# Patient Record
Sex: Female | Born: 1960 | Race: White | Hispanic: No | Marital: Single | Smoking: Current every day smoker
Health system: Southern US, Community
[De-identification: ages and names within clinical notes are randomized; demographics above are authoritative.]

## PROBLEM LIST (undated history)

## (undated) DIAGNOSIS — M1990 Unspecified osteoarthritis, unspecified site: Secondary | ICD-10-CM

## (undated) DIAGNOSIS — F339 Major depressive disorder, recurrent, unspecified: Secondary | ICD-10-CM

## (undated) DIAGNOSIS — E785 Hyperlipidemia, unspecified: Secondary | ICD-10-CM

## (undated) DIAGNOSIS — F319 Bipolar disorder, unspecified: Secondary | ICD-10-CM

## (undated) DIAGNOSIS — Z87442 Personal history of urinary calculi: Secondary | ICD-10-CM

## (undated) DIAGNOSIS — I10 Essential (primary) hypertension: Secondary | ICD-10-CM

## (undated) DIAGNOSIS — Z8619 Personal history of other infectious and parasitic diseases: Secondary | ICD-10-CM

## (undated) DIAGNOSIS — N9489 Other specified conditions associated with female genital organs and menstrual cycle: Secondary | ICD-10-CM

## (undated) DIAGNOSIS — N159 Renal tubulo-interstitial disease, unspecified: Secondary | ICD-10-CM

## (undated) DIAGNOSIS — F17200 Nicotine dependence, unspecified, uncomplicated: Secondary | ICD-10-CM

## (undated) DIAGNOSIS — J449 Chronic obstructive pulmonary disease, unspecified: Secondary | ICD-10-CM

## (undated) DIAGNOSIS — I509 Heart failure, unspecified: Secondary | ICD-10-CM

## (undated) DIAGNOSIS — Z8679 Personal history of other diseases of the circulatory system: Secondary | ICD-10-CM

## (undated) DIAGNOSIS — E119 Type 2 diabetes mellitus without complications: Secondary | ICD-10-CM

## (undated) DIAGNOSIS — F329 Major depressive disorder, single episode, unspecified: Secondary | ICD-10-CM

## (undated) DIAGNOSIS — M545 Low back pain: Secondary | ICD-10-CM

## (undated) DIAGNOSIS — R3915 Urgency of urination: Secondary | ICD-10-CM

## (undated) DIAGNOSIS — Z794 Long term (current) use of insulin: Secondary | ICD-10-CM

## (undated) DIAGNOSIS — N182 Chronic kidney disease, stage 2 (mild): Secondary | ICD-10-CM

## (undated) DIAGNOSIS — A419 Sepsis, unspecified organism: Secondary | ICD-10-CM

## (undated) DIAGNOSIS — N390 Urinary tract infection, site not specified: Secondary | ICD-10-CM

## (undated) DIAGNOSIS — N201 Calculus of ureter: Secondary | ICD-10-CM

## (undated) DIAGNOSIS — Z973 Presence of spectacles and contact lenses: Secondary | ICD-10-CM

## (undated) HISTORY — DX: Sepsis, unspecified organism: A41.9

## (undated) HISTORY — DX: Essential (primary) hypertension: I10

## (undated) HISTORY — DX: Nicotine dependence, unspecified, uncomplicated: F17.200

## (undated) HISTORY — PX: CYSTOSCOPY/URETEROSCOPY/HOLMIUM LASER/STENT PLACEMENT: SHX6546

## (undated) HISTORY — PX: VAGINAL HYSTERECTOMY: SUR661

## (undated) HISTORY — DX: Other specified conditions associated with female genital organs and menstrual cycle: N94.89

## (undated) HISTORY — DX: Urinary tract infection, site not specified: N39.0

## (undated) HISTORY — DX: Major depressive disorder, recurrent, unspecified: F33.9

## (undated) HISTORY — DX: Bipolar disorder, unspecified: F31.9

## (undated) HISTORY — DX: Low back pain: M54.5

## (undated) HISTORY — PX: PARTIAL HYSTERECTOMY: SHX80

## (undated) HISTORY — PX: OTHER SURGICAL HISTORY: SHX169

## (undated) HISTORY — DX: Type 2 diabetes mellitus without complications: E11.9

## (undated) HISTORY — DX: Hyperlipidemia, unspecified: E78.5

---

## 1999-11-22 ENCOUNTER — Emergency Department (HOSPITAL_COMMUNITY): Admission: EM | Admit: 1999-11-22 | Discharge: 1999-11-22 | Payer: Self-pay | Admitting: Emergency Medicine

## 2000-05-21 ENCOUNTER — Emergency Department (HOSPITAL_COMMUNITY): Admission: EM | Admit: 2000-05-21 | Discharge: 2000-05-21 | Payer: Self-pay

## 2000-09-30 ENCOUNTER — Inpatient Hospital Stay (HOSPITAL_COMMUNITY): Admission: EM | Admit: 2000-09-30 | Discharge: 2000-10-02 | Payer: Self-pay | Admitting: Emergency Medicine

## 2000-09-30 ENCOUNTER — Encounter: Payer: Self-pay | Admitting: Emergency Medicine

## 2000-10-01 ENCOUNTER — Encounter: Payer: Self-pay | Admitting: Internal Medicine

## 2000-10-16 ENCOUNTER — Encounter: Admission: RE | Admit: 2000-10-16 | Discharge: 2000-10-16 | Payer: Self-pay | Admitting: Family Medicine

## 2000-10-16 ENCOUNTER — Ambulatory Visit (HOSPITAL_COMMUNITY): Admission: RE | Admit: 2000-10-16 | Discharge: 2000-10-16 | Payer: Self-pay | Admitting: *Deleted

## 2001-02-04 ENCOUNTER — Encounter: Admission: RE | Admit: 2001-02-04 | Discharge: 2001-02-04 | Payer: Self-pay | Admitting: Family Medicine

## 2001-03-07 ENCOUNTER — Encounter: Admission: RE | Admit: 2001-03-07 | Discharge: 2001-03-07 | Payer: Self-pay | Admitting: Family Medicine

## 2001-08-11 ENCOUNTER — Encounter: Admission: RE | Admit: 2001-08-11 | Discharge: 2001-08-11 | Payer: Self-pay | Admitting: Family Medicine

## 2002-05-20 ENCOUNTER — Encounter: Admission: RE | Admit: 2002-05-20 | Discharge: 2002-05-20 | Payer: Self-pay | Admitting: Family Medicine

## 2002-06-30 ENCOUNTER — Encounter: Admission: RE | Admit: 2002-06-30 | Discharge: 2002-06-30 | Payer: Self-pay | Admitting: Family Medicine

## 2003-08-31 ENCOUNTER — Encounter (INDEPENDENT_AMBULATORY_CARE_PROVIDER_SITE_OTHER): Payer: Self-pay | Admitting: *Deleted

## 2003-09-10 ENCOUNTER — Ambulatory Visit (HOSPITAL_COMMUNITY): Admission: RE | Admit: 2003-09-10 | Discharge: 2003-09-10 | Payer: Self-pay | Admitting: Family Medicine

## 2003-09-28 ENCOUNTER — Encounter: Admission: RE | Admit: 2003-09-28 | Discharge: 2003-09-28 | Payer: Self-pay | Admitting: Sports Medicine

## 2003-09-28 ENCOUNTER — Other Ambulatory Visit: Admission: RE | Admit: 2003-09-28 | Discharge: 2003-09-28 | Payer: Self-pay | Admitting: Geriatric Medicine

## 2003-09-28 ENCOUNTER — Encounter (INDEPENDENT_AMBULATORY_CARE_PROVIDER_SITE_OTHER): Payer: Self-pay | Admitting: *Deleted

## 2003-09-30 ENCOUNTER — Ambulatory Visit (HOSPITAL_COMMUNITY): Admission: RE | Admit: 2003-09-30 | Discharge: 2003-09-30 | Payer: Self-pay | Admitting: Family Medicine

## 2003-10-06 ENCOUNTER — Encounter: Admission: RE | Admit: 2003-10-06 | Discharge: 2003-10-06 | Payer: Self-pay | Admitting: Family Medicine

## 2004-10-30 ENCOUNTER — Emergency Department (HOSPITAL_COMMUNITY): Admission: EM | Admit: 2004-10-30 | Discharge: 2004-10-30 | Payer: Self-pay | Admitting: Emergency Medicine

## 2004-12-08 ENCOUNTER — Ambulatory Visit: Payer: Self-pay | Admitting: Family Medicine

## 2005-01-10 ENCOUNTER — Ambulatory Visit: Payer: Self-pay | Admitting: Family Medicine

## 2005-10-25 ENCOUNTER — Ambulatory Visit: Payer: Self-pay | Admitting: Family Medicine

## 2005-11-19 ENCOUNTER — Ambulatory Visit: Payer: Self-pay | Admitting: Family Medicine

## 2005-12-24 ENCOUNTER — Ambulatory Visit: Payer: Self-pay | Admitting: Sports Medicine

## 2006-01-07 ENCOUNTER — Ambulatory Visit: Payer: Self-pay | Admitting: Family Medicine

## 2006-01-13 ENCOUNTER — Emergency Department (HOSPITAL_COMMUNITY): Admission: EM | Admit: 2006-01-13 | Discharge: 2006-01-13 | Payer: Self-pay | Admitting: Emergency Medicine

## 2006-01-15 ENCOUNTER — Emergency Department (HOSPITAL_COMMUNITY): Admission: EM | Admit: 2006-01-15 | Discharge: 2006-01-16 | Payer: Self-pay | Admitting: Emergency Medicine

## 2006-02-22 ENCOUNTER — Ambulatory Visit: Payer: Self-pay | Admitting: Family Medicine

## 2006-03-06 ENCOUNTER — Ambulatory Visit: Payer: Self-pay | Admitting: Family Medicine

## 2006-05-13 ENCOUNTER — Ambulatory Visit: Payer: Self-pay | Admitting: Sports Medicine

## 2006-05-20 ENCOUNTER — Ambulatory Visit: Payer: Self-pay | Admitting: Family Medicine

## 2006-05-28 ENCOUNTER — Ambulatory Visit: Payer: Self-pay | Admitting: Family Medicine

## 2006-06-03 ENCOUNTER — Ambulatory Visit: Payer: Self-pay | Admitting: Sports Medicine

## 2006-06-13 ENCOUNTER — Ambulatory Visit: Payer: Self-pay | Admitting: Sports Medicine

## 2006-06-19 ENCOUNTER — Ambulatory Visit: Payer: Self-pay | Admitting: Family Medicine

## 2006-06-20 ENCOUNTER — Ambulatory Visit: Payer: Self-pay | Admitting: Family Medicine

## 2006-07-03 ENCOUNTER — Ambulatory Visit: Payer: Self-pay | Admitting: Psychology

## 2006-07-15 ENCOUNTER — Ambulatory Visit: Payer: Self-pay | Admitting: Family Medicine

## 2006-07-15 ENCOUNTER — Encounter (INDEPENDENT_AMBULATORY_CARE_PROVIDER_SITE_OTHER): Payer: Self-pay | Admitting: Family Medicine

## 2006-07-25 ENCOUNTER — Ambulatory Visit: Payer: Self-pay | Admitting: Psychology

## 2006-08-02 ENCOUNTER — Ambulatory Visit: Payer: Self-pay | Admitting: Family Medicine

## 2006-08-02 ENCOUNTER — Inpatient Hospital Stay (HOSPITAL_COMMUNITY): Admission: EM | Admit: 2006-08-02 | Discharge: 2006-08-05 | Payer: Self-pay | Admitting: Emergency Medicine

## 2006-08-07 ENCOUNTER — Ambulatory Visit: Payer: Self-pay | Admitting: Family Medicine

## 2006-08-12 ENCOUNTER — Ambulatory Visit: Payer: Self-pay | Admitting: Psychology

## 2006-08-26 ENCOUNTER — Ambulatory Visit: Payer: Self-pay | Admitting: Family Medicine

## 2006-08-29 DIAGNOSIS — M545 Low back pain, unspecified: Secondary | ICD-10-CM

## 2006-08-29 DIAGNOSIS — F339 Major depressive disorder, recurrent, unspecified: Secondary | ICD-10-CM

## 2006-08-29 DIAGNOSIS — I872 Venous insufficiency (chronic) (peripheral): Secondary | ICD-10-CM

## 2006-08-29 DIAGNOSIS — F17200 Nicotine dependence, unspecified, uncomplicated: Secondary | ICD-10-CM | POA: Insufficient documentation

## 2006-08-29 DIAGNOSIS — I10 Essential (primary) hypertension: Secondary | ICD-10-CM

## 2006-08-29 HISTORY — DX: Low back pain, unspecified: M54.50

## 2006-08-29 HISTORY — DX: Nicotine dependence, unspecified, uncomplicated: F17.200

## 2006-08-29 HISTORY — DX: Essential (primary) hypertension: I10

## 2006-08-29 HISTORY — DX: Major depressive disorder, recurrent, unspecified: F33.9

## 2006-08-30 ENCOUNTER — Encounter (INDEPENDENT_AMBULATORY_CARE_PROVIDER_SITE_OTHER): Payer: Self-pay | Admitting: *Deleted

## 2006-09-09 ENCOUNTER — Ambulatory Visit: Payer: Self-pay | Admitting: Psychology

## 2006-09-09 DIAGNOSIS — F319 Bipolar disorder, unspecified: Secondary | ICD-10-CM

## 2006-09-09 HISTORY — DX: Bipolar disorder, unspecified: F31.9

## 2006-10-21 ENCOUNTER — Encounter (INDEPENDENT_AMBULATORY_CARE_PROVIDER_SITE_OTHER): Payer: Self-pay | Admitting: Family Medicine

## 2007-01-14 ENCOUNTER — Encounter (INDEPENDENT_AMBULATORY_CARE_PROVIDER_SITE_OTHER): Payer: Self-pay | Admitting: Family Medicine

## 2007-01-30 ENCOUNTER — Telehealth: Payer: Self-pay | Admitting: *Deleted

## 2007-02-03 ENCOUNTER — Encounter (INDEPENDENT_AMBULATORY_CARE_PROVIDER_SITE_OTHER): Payer: Self-pay | Admitting: Family Medicine

## 2007-02-03 ENCOUNTER — Ambulatory Visit: Payer: Self-pay | Admitting: Family Medicine

## 2007-02-10 ENCOUNTER — Ambulatory Visit: Payer: Self-pay | Admitting: Family Medicine

## 2007-02-10 ENCOUNTER — Encounter (INDEPENDENT_AMBULATORY_CARE_PROVIDER_SITE_OTHER): Payer: Self-pay | Admitting: Family Medicine

## 2007-02-10 LAB — CONVERTED CEMR LAB
BUN: 13 mg/dL (ref 6–23)
CO2: 24 meq/L (ref 19–32)
Calcium: 9.9 mg/dL (ref 8.4–10.5)
Glucose, Bld: 151 mg/dL — ABNORMAL HIGH (ref 70–99)
TSH: 0.756 microintl units/mL (ref 0.350–5.50)

## 2007-03-04 ENCOUNTER — Ambulatory Visit: Payer: Self-pay | Admitting: Internal Medicine

## 2007-04-07 ENCOUNTER — Encounter (INDEPENDENT_AMBULATORY_CARE_PROVIDER_SITE_OTHER): Payer: Self-pay | Admitting: Family Medicine

## 2007-05-19 ENCOUNTER — Telehealth (INDEPENDENT_AMBULATORY_CARE_PROVIDER_SITE_OTHER): Payer: Self-pay | Admitting: Family Medicine

## 2007-05-19 ENCOUNTER — Encounter (INDEPENDENT_AMBULATORY_CARE_PROVIDER_SITE_OTHER): Payer: Self-pay | Admitting: Family Medicine

## 2007-05-19 ENCOUNTER — Ambulatory Visit: Payer: Self-pay | Admitting: Family Medicine

## 2007-05-19 LAB — CONVERTED CEMR LAB
Calcium: 9.2 mg/dL (ref 8.4–10.5)
Glucose, Bld: 110 mg/dL — ABNORMAL HIGH (ref 70–99)
Glucose, Urine, Semiquant: NEGATIVE
Ketones, urine, test strip: NEGATIVE
Potassium: 3.6 meq/L (ref 3.5–5.3)
Protein, U semiquant: 30
Sodium: 141 meq/L (ref 135–145)
Specific Gravity, Urine: 1.015
Urobilinogen, UA: 1

## 2007-06-03 ENCOUNTER — Telehealth: Payer: Self-pay | Admitting: *Deleted

## 2007-06-03 ENCOUNTER — Ambulatory Visit: Payer: Self-pay | Admitting: Family Medicine

## 2007-06-03 LAB — CONVERTED CEMR LAB
Glucose, Urine, Semiquant: NEGATIVE
Ketones, urine, test strip: NEGATIVE
Protein, U semiquant: 30
Specific Gravity, Urine: 1.015
pH: 7

## 2007-06-04 ENCOUNTER — Encounter (INDEPENDENT_AMBULATORY_CARE_PROVIDER_SITE_OTHER): Payer: Self-pay | Admitting: Family Medicine

## 2007-06-04 ENCOUNTER — Encounter: Admission: RE | Admit: 2007-06-04 | Discharge: 2007-06-04 | Payer: Self-pay | Admitting: Sports Medicine

## 2007-06-04 ENCOUNTER — Telehealth (INDEPENDENT_AMBULATORY_CARE_PROVIDER_SITE_OTHER): Payer: Self-pay | Admitting: Family Medicine

## 2007-06-06 ENCOUNTER — Telehealth: Payer: Self-pay | Admitting: *Deleted

## 2007-06-06 ENCOUNTER — Ambulatory Visit: Payer: Self-pay | Admitting: Family Medicine

## 2007-06-06 ENCOUNTER — Inpatient Hospital Stay (HOSPITAL_COMMUNITY): Admission: AD | Admit: 2007-06-06 | Discharge: 2007-06-08 | Payer: Self-pay | Admitting: Internal Medicine

## 2007-06-06 DIAGNOSIS — N200 Calculus of kidney: Secondary | ICD-10-CM

## 2007-06-06 DIAGNOSIS — K8020 Calculus of gallbladder without cholecystitis without obstruction: Secondary | ICD-10-CM | POA: Insufficient documentation

## 2007-06-06 HISTORY — PX: CYSTOSCOPY W/ URETERAL STENT PLACEMENT: SHX1429

## 2007-06-07 ENCOUNTER — Other Ambulatory Visit: Payer: Self-pay | Admitting: Internal Medicine

## 2007-06-12 ENCOUNTER — Ambulatory Visit: Payer: Self-pay | Admitting: Family Medicine

## 2007-06-12 ENCOUNTER — Encounter (INDEPENDENT_AMBULATORY_CARE_PROVIDER_SITE_OTHER): Payer: Self-pay | Admitting: Family Medicine

## 2007-06-12 LAB — CONVERTED CEMR LAB
BUN: 17 mg/dL (ref 6–23)
Calcium: 8.8 mg/dL (ref 8.4–10.5)
Chloride: 106 meq/L (ref 96–112)
Creatinine, Ser: 1.2 mg/dL (ref 0.40–1.20)

## 2007-06-16 ENCOUNTER — Emergency Department (HOSPITAL_COMMUNITY): Admission: EM | Admit: 2007-06-16 | Discharge: 2007-06-16 | Payer: Self-pay | Admitting: Emergency Medicine

## 2007-06-16 ENCOUNTER — Encounter (INDEPENDENT_AMBULATORY_CARE_PROVIDER_SITE_OTHER): Payer: Self-pay | Admitting: Family Medicine

## 2007-06-19 ENCOUNTER — Encounter (INDEPENDENT_AMBULATORY_CARE_PROVIDER_SITE_OTHER): Payer: Self-pay | Admitting: Family Medicine

## 2007-06-19 ENCOUNTER — Ambulatory Visit: Payer: Self-pay | Admitting: Family Medicine

## 2007-06-19 ENCOUNTER — Inpatient Hospital Stay (HOSPITAL_COMMUNITY): Admission: AD | Admit: 2007-06-19 | Discharge: 2007-06-21 | Payer: Self-pay | Admitting: Emergency Medicine

## 2007-06-23 ENCOUNTER — Ambulatory Visit (HOSPITAL_COMMUNITY): Admission: RE | Admit: 2007-06-23 | Discharge: 2007-06-23 | Payer: Self-pay | Admitting: Family Medicine

## 2007-06-23 ENCOUNTER — Encounter (INDEPENDENT_AMBULATORY_CARE_PROVIDER_SITE_OTHER): Payer: Self-pay | Admitting: Family Medicine

## 2007-06-23 ENCOUNTER — Telehealth: Payer: Self-pay | Admitting: *Deleted

## 2007-06-23 ENCOUNTER — Ambulatory Visit: Payer: Self-pay | Admitting: Family Medicine

## 2007-06-23 DIAGNOSIS — N179 Acute kidney failure, unspecified: Secondary | ICD-10-CM

## 2007-06-23 LAB — CONVERTED CEMR LAB
BUN: 10 mg/dL (ref 6–23)
Chloride: 103 meq/L (ref 96–112)
Potassium: 4.4 meq/L (ref 3.5–5.3)
Sodium: 136 meq/L (ref 135–145)

## 2007-06-24 ENCOUNTER — Encounter (INDEPENDENT_AMBULATORY_CARE_PROVIDER_SITE_OTHER): Payer: Self-pay | Admitting: Family Medicine

## 2007-06-30 ENCOUNTER — Ambulatory Visit: Payer: Self-pay | Admitting: Family Medicine

## 2007-07-01 ENCOUNTER — Encounter (INDEPENDENT_AMBULATORY_CARE_PROVIDER_SITE_OTHER): Payer: Self-pay | Admitting: Family Medicine

## 2007-07-04 ENCOUNTER — Telehealth (INDEPENDENT_AMBULATORY_CARE_PROVIDER_SITE_OTHER): Payer: Self-pay | Admitting: Family Medicine

## 2007-07-09 ENCOUNTER — Encounter (INDEPENDENT_AMBULATORY_CARE_PROVIDER_SITE_OTHER): Payer: Self-pay | Admitting: Family Medicine

## 2007-07-15 ENCOUNTER — Encounter (INDEPENDENT_AMBULATORY_CARE_PROVIDER_SITE_OTHER): Payer: Self-pay | Admitting: Family Medicine

## 2007-08-01 ENCOUNTER — Inpatient Hospital Stay (HOSPITAL_COMMUNITY): Admission: RE | Admit: 2007-08-01 | Discharge: 2007-08-03 | Payer: Self-pay | Admitting: Urology

## 2007-08-01 HISTORY — PX: PERCUTANEOUS NEPHROLITHOTRIPSY: SHX2206

## 2007-08-04 ENCOUNTER — Telehealth: Payer: Self-pay | Admitting: *Deleted

## 2007-08-04 ENCOUNTER — Ambulatory Visit: Payer: Self-pay | Admitting: Family Medicine

## 2007-08-11 ENCOUNTER — Emergency Department (HOSPITAL_COMMUNITY): Admission: EM | Admit: 2007-08-11 | Discharge: 2007-08-11 | Payer: Self-pay | Admitting: Emergency Medicine

## 2007-08-19 ENCOUNTER — Telehealth (INDEPENDENT_AMBULATORY_CARE_PROVIDER_SITE_OTHER): Payer: Self-pay | Admitting: Family Medicine

## 2007-09-18 ENCOUNTER — Ambulatory Visit: Payer: Self-pay | Admitting: Family Medicine

## 2007-09-18 ENCOUNTER — Encounter: Admission: RE | Admit: 2007-09-18 | Discharge: 2007-09-18 | Payer: Self-pay | Admitting: Family Medicine

## 2007-09-18 ENCOUNTER — Encounter (INDEPENDENT_AMBULATORY_CARE_PROVIDER_SITE_OTHER): Payer: Self-pay | Admitting: Family Medicine

## 2007-09-18 DIAGNOSIS — N9489 Other specified conditions associated with female genital organs and menstrual cycle: Secondary | ICD-10-CM | POA: Insufficient documentation

## 2007-09-18 HISTORY — DX: Other specified conditions associated with female genital organs and menstrual cycle: N94.89

## 2007-09-18 LAB — CONVERTED CEMR LAB
ALT: 21 units/L (ref 0–35)
AST: 16 units/L (ref 0–37)
Alkaline Phosphatase: 118 units/L — ABNORMAL HIGH (ref 39–117)
BUN: 9 mg/dL (ref 6–23)
Basophils Absolute: 0.2 10*3/uL — ABNORMAL HIGH (ref 0.0–0.1)
Basophils Relative: 2 % — ABNORMAL HIGH (ref 0–1)
Bilirubin Urine: NEGATIVE
Creatinine, Ser: 0.87 mg/dL (ref 0.40–1.20)
Eosinophils Absolute: 0.3 10*3/uL (ref 0.0–0.7)
Glucose, Urine, Semiquant: NEGATIVE
Hemoglobin: 13.7 g/dL (ref 12.0–15.0)
Ketones, urine, test strip: NEGATIVE
MCHC: 30.6 g/dL (ref 30.0–36.0)
MCV: 87.8 fL (ref 78.0–100.0)
Monocytes Absolute: 1 10*3/uL (ref 0.1–1.0)
Monocytes Relative: 8 % (ref 3–12)
Neutrophils Relative %: 70 % (ref 43–77)
Protein, U semiquant: 30
RBC: 5.09 M/uL (ref 3.87–5.11)
RDW: 16.2 % — ABNORMAL HIGH (ref 11.5–15.5)
Urobilinogen, UA: 0.2
pH: 7

## 2007-09-19 ENCOUNTER — Encounter: Payer: Self-pay | Admitting: *Deleted

## 2007-09-20 ENCOUNTER — Encounter (INDEPENDENT_AMBULATORY_CARE_PROVIDER_SITE_OTHER): Payer: Self-pay | Admitting: Family Medicine

## 2007-09-22 ENCOUNTER — Telehealth (INDEPENDENT_AMBULATORY_CARE_PROVIDER_SITE_OTHER): Payer: Self-pay | Admitting: Family Medicine

## 2007-09-23 ENCOUNTER — Encounter (INDEPENDENT_AMBULATORY_CARE_PROVIDER_SITE_OTHER): Payer: Self-pay | Admitting: *Deleted

## 2007-09-25 ENCOUNTER — Ambulatory Visit: Payer: Self-pay | Admitting: Family Medicine

## 2007-10-15 ENCOUNTER — Encounter (INDEPENDENT_AMBULATORY_CARE_PROVIDER_SITE_OTHER): Payer: Self-pay | Admitting: Family Medicine

## 2007-10-22 ENCOUNTER — Telehealth: Payer: Self-pay | Admitting: *Deleted

## 2007-11-04 ENCOUNTER — Ambulatory Visit (HOSPITAL_COMMUNITY): Admission: RE | Admit: 2007-11-04 | Discharge: 2007-11-05 | Payer: Self-pay | Admitting: General Surgery

## 2007-11-04 ENCOUNTER — Encounter (INDEPENDENT_AMBULATORY_CARE_PROVIDER_SITE_OTHER): Payer: Self-pay | Admitting: General Surgery

## 2007-11-04 HISTORY — PX: CHOLECYSTECTOMY, LAPAROSCOPIC: SHX56

## 2007-11-07 ENCOUNTER — Ambulatory Visit: Payer: Self-pay | Admitting: Obstetrics and Gynecology

## 2007-11-07 ENCOUNTER — Encounter: Payer: Self-pay | Admitting: Family Medicine

## 2007-11-07 ENCOUNTER — Encounter (INDEPENDENT_AMBULATORY_CARE_PROVIDER_SITE_OTHER): Payer: Self-pay | Admitting: *Deleted

## 2007-11-21 ENCOUNTER — Ambulatory Visit: Payer: Self-pay | Admitting: Gynecology

## 2007-11-25 ENCOUNTER — Ambulatory Visit (HOSPITAL_COMMUNITY): Admission: RE | Admit: 2007-11-25 | Discharge: 2007-11-25 | Payer: Self-pay | Admitting: Obstetrics & Gynecology

## 2007-12-22 ENCOUNTER — Encounter (INDEPENDENT_AMBULATORY_CARE_PROVIDER_SITE_OTHER): Payer: Self-pay | Admitting: Family Medicine

## 2007-12-24 ENCOUNTER — Encounter (INDEPENDENT_AMBULATORY_CARE_PROVIDER_SITE_OTHER): Payer: Self-pay | Admitting: Family Medicine

## 2007-12-31 DIAGNOSIS — Z8742 Personal history of other diseases of the female genital tract: Secondary | ICD-10-CM

## 2007-12-31 HISTORY — DX: Personal history of other diseases of the female genital tract: Z87.42

## 2008-01-06 ENCOUNTER — Encounter: Payer: Self-pay | Admitting: Family Medicine

## 2008-01-19 ENCOUNTER — Ambulatory Visit: Payer: Self-pay | Admitting: Gynecology

## 2008-01-19 ENCOUNTER — Inpatient Hospital Stay (HOSPITAL_COMMUNITY): Admission: RE | Admit: 2008-01-19 | Discharge: 2008-01-21 | Payer: Self-pay | Admitting: Gynecology

## 2008-01-19 ENCOUNTER — Encounter (INDEPENDENT_AMBULATORY_CARE_PROVIDER_SITE_OTHER): Payer: Self-pay | Admitting: Gynecology

## 2008-01-19 HISTORY — PX: SALPINGOOPHORECTOMY: SHX82

## 2008-02-02 ENCOUNTER — Ambulatory Visit: Payer: Self-pay | Admitting: Obstetrics & Gynecology

## 2008-02-07 ENCOUNTER — Other Ambulatory Visit: Payer: Self-pay | Admitting: Emergency Medicine

## 2008-02-07 ENCOUNTER — Inpatient Hospital Stay (HOSPITAL_COMMUNITY): Admission: AD | Admit: 2008-02-07 | Discharge: 2008-02-11 | Payer: Self-pay | Admitting: Family Medicine

## 2008-02-07 ENCOUNTER — Ambulatory Visit: Payer: Self-pay | Admitting: Gynecology

## 2008-02-07 HISTORY — PX: INCISION AND DRAINAGE ABSCESS: SHX5864

## 2008-03-04 ENCOUNTER — Ambulatory Visit: Payer: Self-pay | Admitting: Gynecology

## 2008-03-11 ENCOUNTER — Ambulatory Visit: Payer: Self-pay | Admitting: Obstetrics and Gynecology

## 2008-03-21 ENCOUNTER — Emergency Department (HOSPITAL_COMMUNITY): Admission: EM | Admit: 2008-03-21 | Discharge: 2008-03-22 | Payer: Self-pay | Admitting: Emergency Medicine

## 2008-04-02 ENCOUNTER — Ambulatory Visit: Payer: Self-pay | Admitting: Family Medicine

## 2008-04-13 ENCOUNTER — Encounter: Payer: Self-pay | Admitting: Family Medicine

## 2008-09-07 ENCOUNTER — Encounter (INDEPENDENT_AMBULATORY_CARE_PROVIDER_SITE_OTHER): Payer: Self-pay | Admitting: Family Medicine

## 2008-09-07 ENCOUNTER — Ambulatory Visit: Payer: Self-pay | Admitting: Family Medicine

## 2008-09-07 DIAGNOSIS — R300 Dysuria: Secondary | ICD-10-CM

## 2008-09-07 LAB — CONVERTED CEMR LAB
Bilirubin Urine: NEGATIVE
Glucose, Urine, Semiquant: NEGATIVE
Ketones, urine, test strip: NEGATIVE
Nitrite: NEGATIVE
Protein, U semiquant: 30
Specific Gravity, Urine: 1.015
Urobilinogen, UA: 0.2
pH: 7

## 2008-09-17 ENCOUNTER — Telehealth: Payer: Self-pay | Admitting: *Deleted

## 2008-10-11 ENCOUNTER — Encounter: Payer: Self-pay | Admitting: Family Medicine

## 2008-10-11 ENCOUNTER — Ambulatory Visit: Payer: Self-pay | Admitting: Family Medicine

## 2008-10-11 DIAGNOSIS — R609 Edema, unspecified: Secondary | ICD-10-CM | POA: Insufficient documentation

## 2008-10-11 LAB — CONVERTED CEMR LAB: Hgb A1c MFr Bld: 5.9 %

## 2008-10-13 ENCOUNTER — Encounter: Payer: Self-pay | Admitting: Family Medicine

## 2008-10-13 LAB — CONVERTED CEMR LAB
ALT: 33 units/L (ref 0–35)
AST: 26 units/L (ref 0–37)
Alkaline Phosphatase: 137 units/L — ABNORMAL HIGH (ref 39–117)
CO2: 19 meq/L (ref 19–32)
Cholesterol: 222 mg/dL — ABNORMAL HIGH (ref 0–200)
Creatinine, Ser: 0.76 mg/dL (ref 0.40–1.20)
Free T4: 0.99 ng/dL (ref 0.80–1.80)
Total Bilirubin: 0.2 mg/dL — ABNORMAL LOW (ref 0.3–1.2)
Total CHOL/HDL Ratio: 6.5
VLDL: 53 mg/dL — ABNORMAL HIGH (ref 0–40)

## 2008-12-26 ENCOUNTER — Emergency Department (HOSPITAL_COMMUNITY): Admission: EM | Admit: 2008-12-26 | Discharge: 2008-12-27 | Payer: Self-pay | Admitting: Emergency Medicine

## 2009-02-15 IMAGING — NM NM LIVER FUNCTION STUDY
1 series · 6 of 6 positions shown · non-contrast
Comparison: none

CLINICAL DATA: Evaluate for cholecystitis. 
NUCLEAR MEDICINE HEPATOBILIARY SCAN:
TECHNIQUE: Sequential abdominal images were obtained for approximately 60 minutes following intravenous injection of radiopharmaceutical.
Radiopharmaceutical:  5.5 mCi Yc-IIm Choletec

[Series 1: he hepatobiliary · 3.22mm/px · 6 of 60 frames shown]
[frame 6/60]
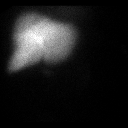
[frame 16/60]
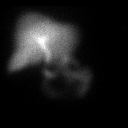
[frame 26/60]
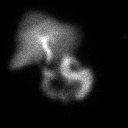
[frame 36/60]
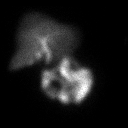
[frame 46/60]
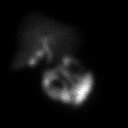
[frame 56/60]
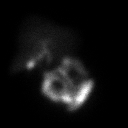

[6 of 6 positions shown; findings below may reference images not displayed]

FINDINGS: There is rapid clearance of the radiotracer from blood pool and homogeneous uptake within the liver.  The common bile duct and small bowel are evident by 15 minutes.  The gallbladder begins to fill at 40 minutes and continues to fill up to 60 minutes.  There is a photopenic defect in the mid gallbladder correlating to large gallstone seen on CT.
IMPRESSION: No evidence of acute cholecystitis. 
Cholelithiasis evident.

## 2009-03-02 ENCOUNTER — Telehealth: Payer: Self-pay | Admitting: Family Medicine

## 2009-03-24 ENCOUNTER — Ambulatory Visit: Payer: Self-pay | Admitting: Family Medicine

## 2009-03-24 LAB — CONVERTED CEMR LAB
Ketones, urine, test strip: NEGATIVE
RBC / HPF: >20
Specific Gravity, Urine: 1.015
pH: 7

## 2009-07-25 ENCOUNTER — Telehealth: Payer: Self-pay | Admitting: *Deleted

## 2009-07-25 ENCOUNTER — Telehealth: Payer: Self-pay | Admitting: Family Medicine

## 2009-07-25 ENCOUNTER — Ambulatory Visit: Payer: Self-pay | Admitting: Family Medicine

## 2009-07-25 LAB — CONVERTED CEMR LAB
Bilirubin Urine: NEGATIVE
Glucose, Urine, Semiquant: 100
Protein, U semiquant: 100
RBC / HPF: >20
Specific Gravity, Urine: 1.02
pH: 6

## 2009-07-26 ENCOUNTER — Telehealth: Payer: Self-pay | Admitting: Family Medicine

## 2010-01-07 ENCOUNTER — Emergency Department (HOSPITAL_COMMUNITY): Admission: EM | Admit: 2010-01-07 | Discharge: 2010-01-07 | Payer: Self-pay | Admitting: Emergency Medicine

## 2010-01-13 ENCOUNTER — Emergency Department (HOSPITAL_COMMUNITY): Admission: EM | Admit: 2010-01-13 | Discharge: 2010-01-14 | Payer: Self-pay | Admitting: Emergency Medicine

## 2010-04-02 ENCOUNTER — Emergency Department (HOSPITAL_COMMUNITY): Admission: EM | Admit: 2010-04-02 | Discharge: 2010-04-02 | Payer: Self-pay | Admitting: Emergency Medicine

## 2010-05-02 ENCOUNTER — Encounter: Payer: Self-pay | Admitting: *Deleted

## 2010-06-12 ENCOUNTER — Encounter: Payer: Self-pay | Admitting: *Deleted

## 2010-07-19 ENCOUNTER — Encounter: Payer: Self-pay | Admitting: *Deleted

## 2010-08-01 NOTE — Progress Notes (Signed)
Summary: Rx Prob  Phone Note Call from Patient Call back at [facility] Phone [phone]   Caller: Patient Summary of Call: The pharmacy did not get the rx for tramodol that was to be sent in also today for pt.  [facility] [address] Initial call taken by: Clydell Hakim,  July 25, 2009 2:07 PM  Follow-up for Phone Call        script called in  Follow-up by: Bobby Rumpf  MD,  July 25, 2009 2:08 PM

## 2010-08-01 NOTE — Progress Notes (Signed)
Summary: Rx Prob  Phone Note Call from Patient   Caller: Patient Summary of Call: Rx for Ultram was to be sent to [facility] [address] Initial call taken by: Clydell Hakim,  July 26, 2009 11:48 AM  Follow-up for Phone Call        will forward to MD. Follow-up by: Theresia Lo RN,  July 26, 2009 12:04 PM    Prescriptions: ULTRAM 50 MG TABS (TRAMADOL HCL) 1 by mouth three times a day as needed severe pain. Works best when taken with tylenol  #30 x 1   Entered and Authorized by:   Ancil Boozer  MD   Signed by:   Ancil Boozer  MD on 07/27/2009   Method used:   Electronically to        [facility] [phone]* (retail)       [address]       [place], [place]  [address]       Ph: [phone]       Fax: [phone]   RxID:   [id number]

## 2010-08-01 NOTE — Progress Notes (Signed)
Summary: triage  Phone Note Call from Patient Call back at Home Phone [phone]   Caller: Patient Summary of Call: pt wants to be seen - thinks she has a UTI Initial call taken by: De Nurse,  July 25, 2009 10:36 AM  Follow-up for Phone Call        she will be here in 15 minutes Follow-up by: Golden Circle RN,  July 25, 2009 10:51 AM

## 2010-08-01 NOTE — Assessment & Plan Note (Signed)
Summary: uti per pt/[name]/Alm   Vital Signs:  Patient profile:   50 year old female Height:      53.5 inches Weight:      271.2 pounds BMI:     66.86 Temp:     97.7 degrees F oral Pulse rate:   86 / minute BP sitting:   135 / 85  (left arm) Cuff size:   large  Vitals Entered By: Gladstone Pih (July 25, 2009 11:56 AM) CC: C/O burning and painful urination, flank pain Is Patient Diabetic? No Pain Assessment Patient in pain? no        Primary Care Provider:  Ancil Boozer  MD  CC:  C/O burning and painful urination and flank pain.  History of Present Illness: 1) UTI: patient presents with flank pain, dysuria, x 1 week. Reports mild nausea w/o emesis. Denies hematuria, fever, vaginal discharge, diarrhea, abnormal bleeding. Not sexually active currently, no STD concerns. Flank pain does not feel like prior nephrolithiasis, does feel similar to prior UTIs. Has tried ibuprofen w/ some relief for pain.   Habits & Providers  Alcohol-Tobacco-Diet     Tobacco Status: current     Tobacco Counseling: to quit use of tobacco products     Cigarette Packs/Day: 1.0  Current Medications (verified): 1)  Aspir-81 81 Mg Tbec (Aspirin) .... Take 1 Tablet By Mouth Every Morning 2)  Toprol Xl 25 Mg Xr24h-Tab (Metoprolol Succinate) .Marland Kitchen.. 1 By Mouth Once Daily 3)  Seroquel Xr 300 Mg Xr24h-Tab (Quetiapine Fumarate) .... 2 By Mouth At Bedtime.  Last Refill Before Visit With Primary Doctor. 4)  Lithium Carbonate 150 Mg  Caps (Lithium Carbonate) .Marland Kitchen.. 1 By Mouth Tid 5)  Norvasc 5 Mg  Tabs (Amlodipine Besylate) .Marland Kitchen.. 1 By Mouth Daily 6)  Ventolin Hfa 108 (90 Base) Mcg/act Aers (Albuterol Sulfate) .... 2 Puffs Q4hr As Needed Shortness of Breath. 7)  Keflex 500 Mg Caps (Cephalexin) .Marland Kitchen.. 1 By Mouth Two Times A Day For 10 Days For Urine Infection. 8)  Ultram 50 Mg Tabs (Tramadol Hcl) .Marland Kitchen.. 1 By Mouth Three Times A Day As Needed Severe Pain. Works [employer] When Taken With Tylenol  Allergies (verified): No Known  Drug Allergies  Physical Exam  General:  obese. NAD  Lungs:  CTAB  Heart:  RRR  Abdomen:  mild flank pain on right w/ palpation, +BS, abdomen non tender, obese  Pulses:  2+ radials  Extremities:  trace edema bilateral lower extremities.  Neurologic:  alert & oriented X3.     Impression & Recommendations:  Problem # 1:  DYSURIA (ICD-788.1) Assessment New Symptomatology, urinalysis c/w UTI. No systemic signs at this time. Will treat with Keflex as below. Ultram for pain. Red flags reviewed. Follow up with PCP as needed.  Her updated medication list for this problem includes:    Keflex 500 Mg Caps (Cephalexin) .Marland Kitchen... 1 by mouth two times a day for 10 days for urine infection.  Orders: Urinalysis-[facility] (00000) [facility]- Est Level  3 (16109)  Complete Medication List: 1)  Aspir-81 81 Mg Tbec (Aspirin) .... Take 1 tablet by mouth every morning 2)  Toprol Xl 25 Mg Xr24h-tab (Metoprolol succinate) .Marland Kitchen.. 1 by mouth once daily 3)  Seroquel Xr 300 Mg Xr24h-tab (Quetiapine fumarate) .... 2 by mouth at bedtime.  last refill before visit with primary doctor. 4)  Lithium Carbonate 150 Mg Caps (Lithium carbonate) .Marland Kitchen.. 1 by mouth tid 5)  Norvasc 5 Mg Tabs (Amlodipine besylate) .Marland Kitchen.. 1 by mouth daily 6)  Ventolin  Hfa 108 (90 Base) Mcg/act Aers (Albuterol sulfate) .... 2 puffs q4hr as needed shortness of breath. 7)  Keflex 500 Mg Caps (Cephalexin) .Marland Kitchen.. 1 by mouth two times a day for 10 days for urine infection. 8)  Ultram 50 Mg Tabs (Tramadol hcl) .Marland Kitchen.. 1 by mouth three times a day as needed severe pain. works best when taken with tylenol  Patient Instructions: 1)  It was great to see you today!  2)  For your urine I have sent over a prescription for Keflex - be sure to take it all.  For pain I have sent over a medicine called ultram (tramadol).  These are [facility] at [facility]. 3)  Be sure to take all of your medications. 4)  You can also drink cranberry juice to help with symptoms  Prescriptions: KEFLEX 500  MG CAPS (CEPHALEXIN) 1 by mouth two times a day for 10 days for urine infection.  #20 x 0   Entered and Authorized by:   Bobby Rumpf  MD   Signed by:   Bobby Rumpf  MD on 07/25/2009   Method used:   Electronically to        [facility] [phone]* (retail)       [address]       [place], [place]  [address]       Ph: [phone]       Fax: [phone]   RxID:   [id number]   Laboratory Results   Urine Tests  Date/Time Received: July 25, 2009 11:59 AM  Date/Time Reported: July 25, 2009 12:24 PM   Routine Urinalysis   Color: yellow Appearance: Cloudy Glucose: 100   (Normal Range: Negative) Bilirubin: negative   (Normal Range: Negative) Ketone: negative   (Normal Range: Negative) Spec. Gravity: 1.020   (Normal Range: 1.003-1.035) Blood: large   (Normal Range: Negative) pH: 6.0   (Normal Range: 5.0-8.0) Protein: 100   (Normal Range: Negative) Urobilinogen: 0.2   (Normal Range: 0-1) Nitrite: positive   (Normal Range: Negative) Leukocyte Esterace: moderate   (Normal Range: Negative)  Urine Microscopic WBC/HPF: loaded RBC/HPF: >20 Bacteria/HPF: 3+ rods and cocci Epithelial/HPF: 5-15 with occ clue cell    Comments: ...............test performed by......Marland KitchenBonnie A. Swazil[place], MLS (ASCP)cm

## 2010-08-01 NOTE — Miscellaneous (Signed)
Summary: refill request  Clinical Lists Changes received refill request from [facility] Dept for Norvasc. sent fax back asking to have patient contact our office.  will need to schedule appointment and can then send in enough med to last until appointment. tried to call patient but phone number is out of service. Theresia Lo RN  May 02, 2010 3:39 PM   Appended Document: refill request called and left message at [phone] to call back to make appointment and discuss refill

## 2010-08-03 NOTE — Miscellaneous (Signed)
Summary: refill request  Clinical Lists Changes received refill request for Toprol XL. tried to call patient , phone number is out of service. RX denied. pharmacist advised to have patient call our office to discuss refill.Theresia Lo RN  July 19, 2010 4:00 PM

## 2010-08-03 NOTE — Miscellaneous (Signed)
Summary: refill request  Clinical Lists Changes received refill request for Toprol from pharmacy. called [phone] and left message on voicemail that is identified as Lorianna Spadaccini to return call. number in IDX is incorrect.  will need to schedule appointment and then can send enough med in to last until appointment. Theresia Lo RN  June 12, 2010 4:34 PM   pharmacy notified to deny and please have patient call us when they have contact with her.  Theresia Lo RN  June 12, 2010 4:38 PM   message again left for patient to return call to discuss meds and schedule appointment. Theresia Lo RN  June 13, 2010 10:02 AM  No refill until she is seen here thanks Pearlean Brownie MD  June 13, 2010 2:00 PM

## 2010-09-06 ENCOUNTER — Inpatient Hospital Stay (HOSPITAL_COMMUNITY)
Admission: EM | Admit: 2010-09-06 | Discharge: 2010-09-09 | DRG: 603 | Disposition: A | Discharge status: Home / Self Care | Payer: Managed Care, Other (non HMO) | Attending: Family Medicine | Admitting: Family Medicine

## 2010-09-06 DIAGNOSIS — F319 Bipolar disorder, unspecified: Secondary | ICD-10-CM | POA: Diagnosis present

## 2010-09-06 DIAGNOSIS — Z7982 Long term (current) use of aspirin: Secondary | ICD-10-CM

## 2010-09-06 DIAGNOSIS — K612 Anorectal abscess: Secondary | ICD-10-CM

## 2010-09-06 DIAGNOSIS — Z818 Family history of other mental and behavioral disorders: Secondary | ICD-10-CM

## 2010-09-06 DIAGNOSIS — Z87891 Personal history of nicotine dependence: Secondary | ICD-10-CM

## 2010-09-06 DIAGNOSIS — J45909 Unspecified asthma, uncomplicated: Secondary | ICD-10-CM | POA: Diagnosis present

## 2010-09-06 DIAGNOSIS — L0231 Cutaneous abscess of buttock: Principal | ICD-10-CM | POA: Diagnosis present

## 2010-09-06 DIAGNOSIS — Z794 Long term (current) use of insulin: Secondary | ICD-10-CM

## 2010-09-06 DIAGNOSIS — I10 Essential (primary) hypertension: Secondary | ICD-10-CM

## 2010-09-06 DIAGNOSIS — E119 Type 2 diabetes mellitus without complications: Secondary | ICD-10-CM | POA: Diagnosis present

## 2010-09-06 DIAGNOSIS — I509 Heart failure, unspecified: Secondary | ICD-10-CM | POA: Diagnosis present

## 2010-09-06 DIAGNOSIS — A4901 Methicillin susceptible Staphylococcus aureus infection, unspecified site: Secondary | ICD-10-CM | POA: Diagnosis present

## 2010-09-06 DIAGNOSIS — N764 Abscess of vulva: Secondary | ICD-10-CM | POA: Diagnosis present

## 2010-09-06 DIAGNOSIS — E109 Type 1 diabetes mellitus without complications: Secondary | ICD-10-CM

## 2010-09-06 LAB — POCT I-STAT, CHEM 8
BUN: 15 mg/dL (ref 6–23)
Calcium, Ion: 1.08 mmol/L — ABNORMAL LOW (ref 1.12–1.32)
Creatinine, Ser: 1.1 mg/dL (ref 0.4–1.2)
TCO2: 21 mmol/L (ref 0–100)

## 2010-09-06 LAB — CBC
MCH: 31.7 pg (ref 26.0–34.0)
MCV: 89.2 fL (ref 78.0–100.0)
Platelets: 291 10*3/uL (ref 150–400)
RBC: 4.92 MIL/uL (ref 3.87–5.11)
RDW: 12.1 % (ref 11.5–15.5)

## 2010-09-06 LAB — DIFFERENTIAL
Eosinophils Absolute: 0.2 10*3/uL (ref 0.0–0.7)
Eosinophils Relative: 1 % (ref 0–5)
Lymphs Abs: 2.1 10*3/uL (ref 0.7–4.0)
Monocytes Relative: 9 % (ref 3–12)

## 2010-09-07 ENCOUNTER — Encounter: Payer: Self-pay | Admitting: Family Medicine

## 2010-09-07 LAB — BASIC METABOLIC PANEL
BUN: 13 mg/dL (ref 6–23)
Chloride: 95 mEq/L — ABNORMAL LOW (ref 96–112)
GFR calc non Af Amer: 58 mL/min — ABNORMAL LOW (ref >60)
Glucose, Bld: 369 mg/dL — ABNORMAL HIGH (ref 70–99)
Potassium: 3.1 mEq/L — ABNORMAL LOW (ref 3.5–5.1)
Sodium: 134 mEq/L — ABNORMAL LOW (ref 135–145)

## 2010-09-07 LAB — POCT I-STAT 3, VENOUS BLOOD GAS (G3P V)
Acid-base deficit: 1 mmol/L (ref 0.0–2.0)
TCO2: 25 mmol/L (ref 0–100)
pH, Ven: 7.393 — ABNORMAL HIGH (ref 7.250–7.300)

## 2010-09-07 LAB — COMPREHENSIVE METABOLIC PANEL
ALT: 33 U/L (ref 0–35)
AST: 41 U/L — ABNORMAL HIGH (ref 0–37)
Albumin: 3.6 g/dL (ref 3.5–5.2)
CO2: 25 mEq/L (ref 19–32)
Calcium: 9.1 mg/dL (ref 8.4–10.5)
Creatinine, Ser: 1.2 mg/dL (ref 0.4–1.2)
GFR calc Af Amer: 58 mL/min — ABNORMAL LOW (ref >60)
GFR calc non Af Amer: 48 mL/min — ABNORMAL LOW (ref >60)
Sodium: 132 mEq/L — ABNORMAL LOW (ref 135–145)
Total Protein: 7.5 g/dL (ref 6.0–8.3)

## 2010-09-07 LAB — CBC
HCT: 41.6 % (ref 36.0–46.0)
Hemoglobin: 15 g/dL (ref 12.0–15.0)
MCV: 89.5 fL (ref 78.0–100.0)
RBC: 4.65 MIL/uL (ref 3.87–5.11)
RDW: 12 % (ref 11.5–15.5)
WBC: 13.9 10*3/uL — ABNORMAL HIGH (ref 4.0–10.5)

## 2010-09-07 LAB — URINALYSIS, ROUTINE W REFLEX MICROSCOPIC
Bilirubin Urine: NEGATIVE
Glucose, UA: >1000 mg/dL — AB
Specific Gravity, Urine: 1.037 — ABNORMAL HIGH (ref 1.005–1.030)
Urobilinogen, UA: 1 mg/dL (ref 0.0–1.0)

## 2010-09-07 LAB — GLUCOSE, CAPILLARY
Glucose-Capillary: 265 mg/dL — ABNORMAL HIGH (ref 70–99)
Glucose-Capillary: 224 mg/dL — ABNORMAL HIGH (ref 70–99)
Glucose-Capillary: 234 mg/dL — ABNORMAL HIGH (ref 70–99)

## 2010-09-07 LAB — URINE MICROSCOPIC-ADD ON

## 2010-09-07 LAB — LITHIUM LEVEL: Lithium Lvl: <0.25 mEq/L — ABNORMAL LOW (ref 0.80–1.40)

## 2010-09-07 LAB — LIPID PANEL
HDL: 23 mg/dL — ABNORMAL LOW (ref >39)
Total CHOL/HDL Ratio: 6.8 RATIO
Triglycerides: 277 mg/dL — ABNORMAL HIGH (ref <150)

## 2010-09-07 LAB — HEMOGLOBIN A1C: Mean Plasma Glucose: 324 mg/dL — ABNORMAL HIGH (ref <117)

## 2010-09-07 NOTE — H&P (Signed)
Family Medicine Teaching [facility] Admission History and Physical  Patient name: [name] Medical record number: [id number] Date of birth: [birth year] Age: 50 y.o. Gender: female  Primary [name] Provider: Cyndee Brightly, [facility] TEAM, MD  Chief Complaint: Boils History of Present Illness: [name] is a 50 y.o. year old female with hx of bipolar, HTN, & asthma who initially presented to [facility] with boils on her buttocks and labia.  Initially boils lanced in ED and patient was going to be sent home, however she was incidentally found to have a blood glucose >500.  Upon further questioning patient states she has been feeling more thirsty,  has been urinating more lately, and feels fatigued.  Has had some mild nausea but no vomiting.  Does endorse a fever at home, a couple days ago, states it was in the 100's and feels cold all of the time  Denies headaches, chest pain, vision changes.  Patient Active Problem List  Diagnoses  . DEPRESSION, MAJOR, RECURRENT  . DISORDER, BIPOLAR NOS  . TOBACCO DEPENDENCE  . HYPERTENSION, BENIGN SYSTEMIC  . VENOUS INSUFFICIENCY, CHRONIC  . OVARIAN MASS  . BACK PAIN, LOW  . PERIPHERAL EDEMA   Past Medical History: ?CHF ovarian cyst - benign tobacco dependence- quit 5 weeks ago hypertension bipolar disorder with history of lithium toxicity with hctz nephrolithiasis (04/02/2008)   Past Surgical History: echo--EF decreased - 10/18/2000,  MUGA--EF 60% - 10/18/2000 benign ovarian cyst removal 2009 at [facility].  left ovary and fallopian tube removed. (04/02/2008) Lithotomy Cholecystectomy  Social History:  Smoking hx ~Quit 5 weeks ago; Electrical engineer at [employer]; Hx EtOH, Denies current drug use; Lives w/  daughterMorrie Sheldon- student at [employer] (04/02/2008)  Family History: Mom--DM, No h/o CAD or CHF, Sister- Bipolar- suicide 2006 (08/29/2006).  Allergies: No Known Allergies  Current Outpatient Prescriptions  Medication Sig Dispense Refill  .  amLODipine (NORVASC) 5 MG tablet Take 5 mg by mouth daily.        Marland Kitchen aspirin 81 MG EC tablet Take 81 mg by mouth daily.        Marland Kitchen lithium 150 MG capsule Take 150 mg by mouth 3 (three) times daily with meals.        . metoprolol succinate (TOPROL-XL) 25 MG 24 hr tablet Take 25 mg by mouth daily.        . QUEtiapine (SEROQUEL XR) 300 MG 24 hr tablet Take 600 mg by mouth at bedtime.        . traMADol (ULTRAM) 50 MG tablet Take 50 mg by mouth every 8 (eight) hours as needed.         Review Of Systems: Per HPI Otherwise 12 point review of systems was performed and was unremarkable.  Physical Exam: Pulse: 104  Blood Pressure: 131/78 RR: 20   O2: 96 on ra Temp: 98.5  General: alert, no distress and moderately obese HEENT: PERRLA, extra ocular movement intact, sclera clear, anicteric, oropharynx clear, no lesions, neck supple with midline trachea and thyroid without masses Heart: S1, S2 normal, no murmur, rub or gallop, regular rate and rhythm Lungs: clear to auscultation, no wheezes or rales and unlabored breathing Abdomen: abdomen is soft without significant tenderness, masses, organomegaly or guarding Extremities: edema 1+ Skin: Abscess s/p i&d on L labia, packed.  Abscess on R buttock s/p I&D draining covered with gauze. Neurology: normal without focal findings and mental status, speech normal, alert and oriented x3  Labs and Imaging: Lab Results  Component Value Date/Time  NA 133* 09/06/2010 11:02 PM   K 3.0* 09/06/2010 11:02 PM   CL 99 09/06/2010 11:02 PM   CO2 19 10/11/2008  8:36 PM   BUN 15 09/06/2010 11:02 PM   CREATININE 1.1 09/06/2010 11:02 PM   GLUCOSE 597* 09/06/2010 11:02 PM  AG= 13  Lab Results  Component Value Date   WBC 14.6* 09/06/2010   HGB 16.0* 09/06/2010   HCT 47.0* 09/06/2010   MCV 89.2 09/06/2010   PLT 291 09/06/2010   LA: 2.5 ABG:  7.393/39.9/43.0/24.3   Assessment and Plan: [name] is a 50 y.o. year old female presenting with gluteal and labial abscesses found to  have new onset DM 1. New onset DM:  Do not think patient is in DKA, Gap only mildly elevated at 13, ABG with relatively normal pH, and patient in no distress.  Will hydrate agressively at 200cc/hr with NS.  Will start on basal insulin as well as SSI.  Will adjust basal as needed.  Will order Diabetes education and place on CHO mod diet.  Will further risk stratify with FLP, A1C and TSH.  Will obtain stat CMP now to confirm electrolytes.   2.  Abscesses:  S/p I&D in ED, will continue on clindamycin and follow WBC.  Pt. Afebrile, but LA mildly elevated, will re-check. 3.  HTN:  Continue home meds, will likely need ACE with new onset diabetes 4.  ? CHF:  Pt endorses hx of CHF, no sob or crackles on exam, does have trace edema.  No recorded echo, will consider.   5.  Bipolar d/o:  Continue home meds for now may have to stop seroquel 2/2 to diabetes. 6. FEN/GI: Hypokalemia, will replete and f/u in am.  CHO modified diet, NS+20KCL @ 200cc/hr 7. Prophylaxis: Heparin 5000 units TID 4. Disposition: Pending clinical improvement and education.

## 2010-09-08 LAB — GLUCOSE, CAPILLARY
Glucose-Capillary: 256 mg/dL — ABNORMAL HIGH (ref 70–99)
Glucose-Capillary: 191 mg/dL — ABNORMAL HIGH (ref 70–99)

## 2010-09-09 LAB — CBC
Hemoglobin: 13.3 g/dL (ref 12.0–15.0)
MCH: 32.1 pg (ref 26.0–34.0)
MCHC: 35.2 g/dL (ref 30.0–36.0)
RDW: 12.3 % (ref 11.5–15.5)

## 2010-09-09 LAB — BASIC METABOLIC PANEL
CO2: 27 mEq/L (ref 19–32)
Calcium: 9 mg/dL (ref 8.4–10.5)
Creatinine, Ser: 0.89 mg/dL (ref 0.4–1.2)
GFR calc Af Amer: >60 mL/min (ref >60)
GFR calc non Af Amer: >60 mL/min (ref >60)

## 2010-09-09 LAB — URINE CULTURE

## 2010-09-09 LAB — CULTURE, ROUTINE-ABSCESS

## 2010-09-09 LAB — GLUCOSE, CAPILLARY: Glucose-Capillary: 293 mg/dL — ABNORMAL HIGH (ref 70–99)

## 2010-09-11 ENCOUNTER — Ambulatory Visit: Payer: Managed Care, Other (non HMO) | Admitting: Family Medicine

## 2010-09-11 VITALS — BP 136/88 | HR 98 | Temp 97.7°F | Wt 269.0 lb

## 2010-09-11 DIAGNOSIS — Z5189 Encounter for other specified aftercare: Secondary | ICD-10-CM

## 2010-09-11 NOTE — Progress Notes (Signed)
Patient in  For wounf recheck.  Area on lower buttock is hard and red and has drainage noted on dressing.  Patient will be seen by Dr. Hulen Luster now.

## 2010-09-15 NOTE — Discharge Summary (Signed)
NAMEGWENDALYN, [name]           ACCOUNT NO.:  [id number]  MEDICAL RECORD NO.:  [id number]           PATIENT TYPE:  I  LOCATION:  5029                         FACILITY:  [facility]  PHYSICIAN:  Leighton Roach McDiarmid, M.D.DATE OF BIRTH:  10-06-1960  DATE OF ADMISSION:  09/06/2010 DATE OF DISCHARGE:  09/09/2010                              DISCHARGE SUMMARY   PRIMARY CARE PROVIDER:  [facility] at [facility].  DISCHARGE DIAGNOSES: 1. Labial and buttock S.aureus abscesses, status post incision and drainage. 2. Diabetes mellitus type 2, new onset. 3. Hypertension. 4. Bipolar disorder.  DISCHARGE MEDICATIONS: 1. Amlodipine 5 mg p.o. daily. 2. Bactrim 800/160 p.o. b.i.d. x7 days. 3. Insulin NPH 12 units subcu b.i.d. with meals. 4. Metformin 500 mg p.o. b.i.d. 5. Simvastatin 20 mg p.o. at bedtime. 6. Tramadol 50 mg p.o. t.i.d. p.r.n. for pain. 7. Aspirin 81 mg p.o. daily. 8. Lithium 100 mg p.o. t.i.d. 9. Metoprolol XL succinate 25 mg p.o. daily. 10.Multivitamin 1 tablet p.o. daily. 11.Seroquel 600 mg p.o. at bedtime.  PERTINENT LABORATORY VALUES:  On September 06, 2010, CBC with differential white blood cell count 14.6, hemoglobin 15.6, hematocrit 43.5, platelets 291, and normal differential.  Lactic acid was 2.5.  On September 07, 2010, the urinalysis was significant for appearance cloudy, specific gravity 1.037, urine glucose greater than 1000, blood large, protein 30, nitrate positive, and leukocytes moderate.  Urine microscopic showed epithelial cells many, white blood cells too numerous to count, red blood cells 11- 20, bacteria many.  A urine culture grew out greater than 100,000 colonies of E-coli that was resistant to ampicillin, ciprofloxacin, levofloxacin, but otherwise pansensitive.  Lipid profile:  Total cholesterol 156, triglycerides 277, HDL 23, LDL 78, VLDL 55, and ratio 6.8.  TSH was 2.242.  Hemoglobin A1c was 12.9.  On September 09, 2010, CBC showed white blood cell  count 8.0, hemoglobin 13.3, hematocrit 37.8, and platelets 239.  BRIEF HOSPITAL COURSE:  [name] is a 50 year old female who presented to the emergency room complaining of labial and buttock pain and was found to have abscesses in these areas as well as hyperglycemia. 1. Labial and buttock abscesses.  The abscesses were I and D'ed in the     emergency department and the fluid was sent for culture.  The     packing was placed and the patient's dressings were changed when     soiled or at least once daily.  The wound culture grew out moderate     staph aureus that was sensitive to Bactrim as well as clindamycin.     The patient's pain was controlled with oral medications.  She was     discharged home with tramadol for pain control.  The patient will     follow up with a nurse visit on September 11, 2010 to have the packing     removed and possibly replaced if necessary.  The patient was     discharged home to complete a 10-day course of antibiotics.  She     was discharged with Bactrim. 2. New onset diabetes.  On admission, the patient's glucose was  greater than 600.  She did not have an anion gap, however, she was     placed on the glucose stabilizer insulin drip.  She was     transitioned to subcutaneous insulin.  The patient's insurance does     not cover prescription medications, so she was put on NPH and this     was titrated to 12 units subcu b.i.d.  The patient was also started     on metformin 500 mg b.i.d. to assist with improving insulin     sensitivity.  The patient was also started on Zocor as diabetes     recommendation suggest reduction of cholesterol by third with new     onset diabetic.  The patient is to follow up at [facility]     Medicine for titration with her diabetic medications.  She was     taught through the nursing teach how to use insulin, how to use a     glucometer as well as diabetic nutrition teaching.  The patient     would likely benefit  from outpatient diabetic referral to nutrition     as well. 3. Bipolar disorder.  The patient was kept on her home lithium and     Seroquel.  While in the hospital, she was stable from a psychiatric     standpoint and although Seroquel is known to increase metabolic     syndrome, we felt the patient would be more at risk if her     psychiatric medications were changed as she would have more     difficulty taking her diabetes medicine if she became unstable     psychiatrically.  No changes were made. 4. Hypertension.  The patient was kept on her home dose of metoprolol     succinate and she was also continued on her aspirin.  No changes     were made.  FOLLOWUP ISSUES/RECOMMENDATIONS: 1. The patient is to follow up at [facility] on September 11, 2010 at 3:00 p.m. for a wound check and blood sugar check.  The     packing should be removed from her labia and buttock abscesses.  If     there is minimal drainage, that can be left been or packing may be     replaced if necessary. 2. For diabetes, the patient is to follow up with Dr. Lelon Perla at     [facility] on September 19, 2010 for further medication     titration.  DISCHARGE CONDITION:  The patient was discharged home in stable medical condition.    ______________________________ Ardyth Gal, MD   ______________________________ Leighton Roach McDiarmid, M.D.    CR/MEDQ  D:  09/10/2010  T:  09/11/2010  Job:  [id number]  Electronically Signed by Ardyth Gal MD on 09/12/2010 02:22:31 PM Electronically Signed by Acquanetta Belling M.D. on 09/15/2010 05:17:58 PM

## 2010-09-16 LAB — POCT I-STAT, CHEM 8
BUN: 16 mg/dL (ref 6–23)
Chloride: 106 mEq/L (ref 96–112)
Creatinine, Ser: 0.8 mg/dL (ref 0.4–1.2)
Glucose, Bld: 226 mg/dL — ABNORMAL HIGH (ref 70–99)
HCT: 47 % — ABNORMAL HIGH (ref 36.0–46.0)
Potassium: 3.4 mEq/L — ABNORMAL LOW (ref 3.5–5.1)

## 2010-09-16 LAB — CBC
Hemoglobin: 15.4 g/dL — ABNORMAL HIGH (ref 12.0–15.0)
RBC: 4.6 MIL/uL (ref 3.87–5.11)

## 2010-09-16 LAB — URINALYSIS, ROUTINE W REFLEX MICROSCOPIC
Bilirubin Urine: NEGATIVE
Glucose, UA: 500 mg/dL — AB
Ketones, ur: NEGATIVE mg/dL
Nitrite: NEGATIVE
Protein, ur: NEGATIVE mg/dL
pH: 7 (ref 5.0–8.0)

## 2010-09-16 LAB — DIFFERENTIAL
Lymphocytes Relative: 24 % (ref 12–46)
Lymphs Abs: 2.1 10*3/uL (ref 0.7–4.0)
Monocytes Relative: 7 % (ref 3–12)
Neutro Abs: 5.6 10*3/uL (ref 1.7–7.7)
Neutrophils Relative %: 66 % (ref 43–77)

## 2010-09-16 LAB — URINE MICROSCOPIC-ADD ON

## 2010-09-17 LAB — URINE MICROSCOPIC-ADD ON

## 2010-09-17 LAB — URINALYSIS, ROUTINE W REFLEX MICROSCOPIC
Bilirubin Urine: NEGATIVE
Nitrite: POSITIVE — AB
Specific Gravity, Urine: 1.008 (ref 1.005–1.030)
pH: 6.5 (ref 5.0–8.0)

## 2010-09-17 LAB — URINE CULTURE

## 2010-09-19 ENCOUNTER — Ambulatory Visit (INDEPENDENT_AMBULATORY_CARE_PROVIDER_SITE_OTHER): Payer: Managed Care, Other (non HMO) | Admitting: Family Medicine

## 2010-09-19 ENCOUNTER — Encounter: Payer: Self-pay | Admitting: Family Medicine

## 2010-09-19 VITALS — BP 134/82 | HR 90 | Temp 98.2°F | Wt 262.4 lb

## 2010-09-19 DIAGNOSIS — E119 Type 2 diabetes mellitus without complications: Secondary | ICD-10-CM

## 2010-09-19 DIAGNOSIS — F319 Bipolar disorder, unspecified: Secondary | ICD-10-CM

## 2010-09-19 DIAGNOSIS — E785 Hyperlipidemia, unspecified: Secondary | ICD-10-CM

## 2010-09-19 DIAGNOSIS — I10 Essential (primary) hypertension: Secondary | ICD-10-CM

## 2010-09-19 HISTORY — DX: Hyperlipidemia, unspecified: E78.5

## 2010-09-19 HISTORY — DX: Type 2 diabetes mellitus without complications: E11.9

## 2010-09-19 MED ORDER — SIMVASTATIN 20 MG PO TABS: 20.0000 mg | ORAL_TABLET | Freq: Every evening | ORAL | Status: DC

## 2010-09-19 MED ORDER — METFORMIN HCL 500 MG PO TABS: 500.0000 mg | ORAL_TABLET | Freq: Two times a day (BID) | ORAL | Status: DC

## 2010-09-19 MED ORDER — BLOOD GLUCOSE METER KIT: PACK | Status: AC

## 2010-09-19 MED ORDER — LISINOPRIL 10 MG PO TABS: 10.0000 mg | ORAL_TABLET | Freq: Every day | ORAL | Status: DC

## 2010-09-19 MED ORDER — QUETIAPINE FUMARATE ER 300 MG PO TB24: 600.0000 mg | ORAL_TABLET | Freq: Every day | ORAL | Status: DC

## 2010-09-19 MED ORDER — INSULIN NPH (HUMAN) (ISOPHANE) 100 UNIT/ML ~~LOC~~ SUSP: SUBCUTANEOUS | Status: DC

## 2010-09-19 MED ORDER — METOPROLOL SUCCINATE ER 25 MG PO TB24: 25.0000 mg | ORAL_TABLET | Freq: Every day | ORAL | Status: DC

## 2010-09-19 NOTE — Assessment & Plan Note (Signed)
Blood sugars much better controlled.  Not quite at goal but very much improved.  She is endorsing some blurry vision so will sent to [facility] for evaluation.

## 2010-09-19 NOTE — Assessment & Plan Note (Signed)
BP at goal.  Will add in Lisinopril for kidney protection.

## 2010-09-19 NOTE — Progress Notes (Signed)
  Subjective:    Patient ID: [name], female    DOB: 05-05-1961, 50 y.o.   MRN: [id number]  HPI Hospital follow up for abscesses / newly diagnosed DMII / HTN / Bipolar  1. Abscess: labial and buttock abscess.  They were I&D'ed in the ED and are healing well.  She completed her course of abx  2. New DMII:  She has been taking her NPH and Metformin as prescribed.  She has been checking her blood sugars regularly with her brother's meter.  It has been averaging around 130-230.  She does endorse some new blurry vision  3. HTN:  Taking medicines as prescribed.  Doesn't check her blood pressure at home.  Was not started on Lisinopril in the hospital  4. Bipolar: stable.  Taking medicines as prescribed.   Review of Systems  Constitutional: Negative for fever, chills, activity change, fatigue and unexpected weight change.  Respiratory: Negative for cough and shortness of breath.   Cardiovascular: Negative for chest pain and leg swelling.  Gastrointestinal: Negative for abdominal distention.  Neurological: Positive for dizziness. Negative for weakness, light-headedness, numbness and headaches.  Psychiatric/Behavioral: Negative for hallucinations, behavioral problems, confusion and agitation.       Objective:   Physical Exam  Constitutional: No distress.       Obese  Eyes: Conjunctivae and EOM are normal. Pupils are equal, round, and reactive to light.       Normal fundoscopic exam  Neck: Normal range of motion. Neck supple.  Cardiovascular: Normal rate and regular rhythm.   Pulmonary/Chest: Effort normal and breath sounds normal. No respiratory distress. She has no wheezes.  Abdominal: Soft. Bowel sounds are normal. She exhibits no distension and no mass. There is no tenderness. There is no rebound and no guarding.  Musculoskeletal: Normal range of motion. She exhibits no edema.  Skin: Skin is warm and dry. No rash noted. No erythema.  Psychiatric: She has a normal mood and  affect.          Assessment & Plan:

## 2010-09-19 NOTE — Assessment & Plan Note (Signed)
Continue Zocor started in the hospital

## 2010-09-19 NOTE — Patient Instructions (Signed)
I'm glad that you are doing better I am going to send you to an eye doctor to have your eye's checked I am also going to start another medicine to help protect your kidneys (Lisinopril) Please schedule a follow up appointment in 6 weeks

## 2010-09-19 NOTE — Assessment & Plan Note (Signed)
Mood stable.  No changes today. 

## 2010-09-20 NOTE — H&P (Signed)
[name], [name] NO.:  [id number]  MEDICAL RECORD NO.:  [id number]           PATIENT TYPE:  I  LOCATION:  5029                         FACILITY:  [facility]  PHYSICIAN:  Paula Compton, MD        DATE OF BIRTH:  18-Mar-1961  DATE OF ADMISSION:  09/06/2010 DATE OF DISCHARGE:                             HISTORY & PHYSICAL   PRIMARY CARE PROVIDER:  [facility] at [facility].  CHIEF COMPLAINT:  Boils.  HISTORY OF PRESENT ILLNESS:  Holly [name] is a 50 year old female with history of bipolar disorder, hypertension, and asthma, initially presented to [facility] ED with boils on her buttocks and labia. Initially, boils were lanced in the emergency department, and the patient was going to be sent home.  However, she was incidentally found to have a blood glucose greater than 500.  Upon further questioning, the patient states she has been feeling more thirsty, has been urinatingmore lately and feels increased fatigue.  She has had some mild nausea but no vomiting.  Does endorse a fever at home a couple days of ago and states it was in the 100s as well as she felt cold all the time.  She denies headache, chest pain, vision changes at this time.  PAST MEDICAL HISTORY: 1. Hypertension. 2. Bipolar disorder. 3. History of nephrolithiasis. 4. Tobacco dependence, she quit 5 weeks ago. 5. Ovarian cyst. 6. Question of CHF.  PAST SURGICAL HISTORY: 1. Benign ovarian cyst removal in 2009, along with left ovary and     fallopian tube. 2. Lithotomy. 3. Cholecystectomy. 4. She had an echo in 2002.  EF was decreased, however, there is no     recorded EF percentage.  SOCIAL HISTORY:  She quit smoking 5 weeks ago.  She works as a Electrical engineer at [employer].  Denies any current drug or alcohol use.  FAMILY HISTORY:  She has a mother with diabetes.  No history of coronary artery disease or CHF.  She has a sister with bipolar disease  who committed suicide in 2006.  ALLERGIES:  No known drug allergies.  MEDICATIONS: 1. Amlodipine 5 mg daily. 2. Aspirin 81 mg daily. 3. Lithium 150 mg t.i.d. 4. Metoprolol XL 25 mg daily. 5. Seroquel XR 6 mg p.o. at bedtime. 6. Tramadol 50 mg 1 tab q.8 hours p.r.n. 7. Albuterol MDI 2 puffs q.4 hours p.r.n.  REVIEW OF SYSTEMS:  Please see HPI, otherwise 12-point review of systems was performed and was unremarkable.  PHYSICAL EXAMINATION:  VITAL SIGNS:  Pulse 104, blood pressure 131/78, respiration rate 20, oxygen 96% on room air, temperature 98.5. GENERAL:  Alert, in no distress, and mildly obese. HEENT:  Pupils equal, round, and reactive to light.  Extraocular movements intact.  Sclerae clear, anicteric.  Oropharynx is clear without lesions. NECK:  Supple with midline trachea and thyroid without masses. HEART:  Normal S1 and S2.  No murmur, rub, or gallop.  Regular rate and rhythm. LUNGS:  Clear to auscultation bilaterally.  No wheezes, rales, or labored breathing. ABDOMEN:  Soft, nontender.  No masses or guarding. EXTREMITIES:  1+ edema. SKIN:  Abscess status post I and D on left labia that is packed. Abscess on right buttock status post I and D, draining, covered with gauze. NEUROLOGIC:  Normal without focal findings.  Mental status, speech is normal.  Alert and oriented x3.  LABS AND IMAGING:  BMET; sodium 133, potassium 3.0 chloride 99, bicarb 19, BUN of 15, creatinine 1.1, glucose of 597, anion gap of 13.  CBC with WBCs of 14.6, hemoglobin of 16, hematocrit of 47, platelets of 291. Lactic acid was 2.5.  ABG with pH of 7.393, PCO2 of 39.9, PO2 of 43.0, and bicarb of 24.3.  ASSESSMENT AND PLAN:  Holly [name] is a 50 year old female, presenting with gluteal and labial abscess, who was found to have new- onset diabetes mellitus.  1. New-onset diabetes mellitus.  I do not think the patient is in     diabetic ketoacidosis, gap only mildly elevated at 13.  ABG with      relatively normal pH and the patient is in no distress.  We will     hydrate aggressively at 200 mL/hour with normal saline.  We will     start on basal insulin at 15 units of Lantus as well as sliding     scale insulin sensitive.  We will adjust basal as needed.  We will     order diabetes education and place on carbohydrate-modified diet.     We will further risk stratify with fasting lipid panel, A1c, and     TSH.  We will change that CMP now to confirm electrolytes since     labs obtained in the ED were i-STAT. 2. Abscesses status post I and D in the emergency department.  We will     continue on clindamycin and follow white blood cells.  The patient     is afebrile.  The lactic acid is mildly elevated, we will recheck. 3. Hypertension.  Continue home meds, will likely need ACE with new-     onset diabetes. 4. Question of CHF.  The patient endorses a history of CHF.  No     shortness of breath or crackles on exam.  She does, however, have     mild trace edema.  No recorded echo, EF percentage, however, review     of her history shows that she did have reduced EF on the previous     echo in 2002.  We will consider repeat echo if becomes symptomatic. 5. Bipolar disorder.  Continue home meds for now.  Stop Seroquel     secondary to diabetes. 6. Fluids, electrolytes, and nutrition/gastrointestinal, hypokalemia.     We will replete and follow up in a.m.     Carbohydrate-modified diet, normal saline plus 20 KCl at 200 mL per     hour. 7. Prophylaxis, heparin 5000 units t.i.d. 8. Disposition, pending clinical improvement and education.    ______________________________ Everrett Coombe, MD   ______________________________ Paula Compton, MD    CM/MEDQ  D:  09/07/2010  T:  09/07/2010  Job:  [id number]  Electronically Signed by Everrett Coombe MD on 09/08/2010 12:00:33 AM Electronically Signed by Paula Compton MD on 09/20/2010 08:26:07 AM

## 2010-10-04 ENCOUNTER — Ambulatory Visit: Payer: Managed Care, Other (non HMO) | Admitting: Family Medicine

## 2010-10-09 LAB — URINALYSIS, ROUTINE W REFLEX MICROSCOPIC
Bilirubin Urine: NEGATIVE
Nitrite: POSITIVE — AB
Specific Gravity, Urine: 1.013 (ref 1.005–1.030)
pH: 6.5 (ref 5.0–8.0)

## 2010-10-09 LAB — POCT I-STAT, CHEM 8
BUN: 9 mg/dL (ref 6–23)
Calcium, Ion: 1.11 mmol/L — ABNORMAL LOW (ref 1.12–1.32)
Creatinine, Ser: 0.8 mg/dL (ref 0.4–1.2)
TCO2: 22 mmol/L (ref 0–100)

## 2010-10-09 LAB — DIFFERENTIAL
Eosinophils Relative: 1 % (ref 0–5)
Lymphocytes Relative: 10 % — ABNORMAL LOW (ref 12–46)
Lymphs Abs: 1.3 10*3/uL (ref 0.7–4.0)
Monocytes Absolute: 1.3 10*3/uL — ABNORMAL HIGH (ref 0.1–1.0)
Monocytes Relative: 10 % (ref 3–12)

## 2010-10-09 LAB — URINE CULTURE

## 2010-10-09 LAB — CBC
HCT: 40.7 % (ref 36.0–46.0)
Hemoglobin: 13.8 g/dL (ref 12.0–15.0)
RBC: 4.45 MIL/uL (ref 3.87–5.11)
WBC: 13.3 10*3/uL — ABNORMAL HIGH (ref 4.0–10.5)

## 2010-10-09 LAB — URINE MICROSCOPIC-ADD ON

## 2010-11-02 ENCOUNTER — Ambulatory Visit (INDEPENDENT_AMBULATORY_CARE_PROVIDER_SITE_OTHER): Payer: 59 | Admitting: Family Medicine

## 2010-11-02 ENCOUNTER — Encounter: Payer: Self-pay | Admitting: Family Medicine

## 2010-11-02 DIAGNOSIS — I10 Essential (primary) hypertension: Secondary | ICD-10-CM

## 2010-11-02 DIAGNOSIS — H538 Other visual disturbances: Secondary | ICD-10-CM

## 2010-11-02 DIAGNOSIS — E119 Type 2 diabetes mellitus without complications: Secondary | ICD-10-CM

## 2010-11-02 NOTE — Assessment & Plan Note (Signed)
Improved with the use of reading glasses.  Advised her to still follow up with Opthalmology for eye exam.

## 2010-11-02 NOTE — Assessment & Plan Note (Signed)
Overall appears improved.  Last A1C was 12.9 on March 8th.  CBGs now acceptable.  Will have her back in 6 weeks to recheck the A1C

## 2010-11-02 NOTE — Progress Notes (Signed)
  Subjective:    Patient ID: [name], female    DOB: 1960-08-15, 50 y.o.   MRN: [id number]  HPI 1. DMII:  She is taking her medications as prescribed.  She is checking random blood glucose through out the days.  Most recent fasting was 105.  Her CBGs have been between 105-160.  She is trying to watch her diet but is still eating lots of carbs  2. HTN;  She is taking her medications as prescribed.  She doesn't check her blood pressure at home regularly.    3. Blurry vision:  She hasn't been to the Opthalmologist yet because she couldn't afford it.  She did go get some reading glasses and that has helped.   Review of Systems Denies chest pain, shortness of breath, headaches.  Denies shakiness, dizziness, light-headedness    Objective:   Physical Exam  Constitutional: No distress.       Obese  Eyes: Conjunctivae and EOM are normal. Pupils are equal, round, and reactive to light.       Normal fundoscopic exam  Neck: Normal range of motion. Neck supple.  Cardiovascular: Normal rate and regular rhythm.   Pulmonary/Chest: Effort normal and breath sounds normal. No respiratory distress. She has no wheezes.  Abdominal: Soft. Bowel sounds are normal. She exhibits no distension and no mass. There is no tenderness. There is no rebound and no guarding.  Musculoskeletal: Normal range of motion. She exhibits no edema.  Skin: Skin is warm and dry. No rash noted. No erythema.  Psychiatric: She has a normal mood and affect.          Assessment & Plan:

## 2010-11-02 NOTE — Assessment & Plan Note (Signed)
BP initially elevated but improved on recheck.  Will not make any changes today.  Recheck BMET at next visit.

## 2010-11-14 NOTE — Op Note (Signed)
[name], [name]           ACCOUNT NO.:  [id number]   MEDICAL RECORD NO.:  [id number]          PATIENT TYPE:  INP   LOCATION:  1430                         FACILITY:  [facility]   PHYSICIAN:  Bertram Millard. Dahlstedt, M.D.DATE OF BIRTH:  1961-01-09   DATE OF PROCEDURE:  08/01/2007  DATE OF DISCHARGE:                               OPERATIVE REPORT   PREOPERATIVE DIAGNOSIS:  Right renal calculi.   POSTOPERATIVE DIAGNOSIS:  Right renal calculi.   PROCEDURE PERFORMED:  Right percutaneous nephrolithotomy.   SURGEON:  Bertram Millard. Dahlstedt, M.D.   RADIOLOGIST:  Ruel Favors, M.D.   COMPLICATIONS:  None.   BRIEF HISTORY:  A 46-year female with large right renal calculi which  was symptomatic and including a right proximal ureter stone that was  causing hydronephrosis.  She has had a stent placed.  Due to the bulk of  her disease, it was recommended she undergo percutaneous  nephrolithotomy.  Dr. Ruel Favors established percutaneous access today  in the right kidney.  She presents for percutaneous procedure.   DESCRIPTION OF PROCEDURE:  The patient was administered preoperative IV  antibiotics.  She was taken to the operating room where general  endotracheal anesthetic was administered.  She was placed in the prone  position, all extremities padded appropriately.  Foley catheter was  placed.  Her right flank was prepped and draped.  Dr. Miles Costain established  percutaneous access with a 30-French sheath.  I then placed nephroscope  through this.  Approximately three large stones were encountered.  They  were grasped, brought into the renal pelvis, and ablated and removed.  It took approximately one hour to get these three stones.  I then turned  my attention to a stone which was approximately 4- to 5-cm down the  proximal ureter.  I was able to guide a flexible cystoscope to this  area.  I then used a basket to get as many fragments as I could from  this area.  It was somewhat impacted.  I  was unable to get all of the  stone burden out, as I could not establish adequate visualization of  this area.  I tried to get a double-J stent by, but this was impossible  due to the impacted nature of the stone.   At this point, the procedure was terminated.  I placed an 18-French  [place] catheter in the right renal pelvis, sutured to the skin with a  silk suture.  The balloon was filled with approximately 5 mL of saline.  Adequate positioning was confirmed by placing contrast in the pelvis.   At this point, a dry sterile dressing was placed.  The patient was  awakened and taken to the PACU in stable condition.  She tolerated the  procedure well.      Bertram Millard. Dahlstedt, M.D.  Electronically Signed     SMD/MEDQ  D:  08/01/2007  T:  08/02/2007  Job:  [id number]

## 2010-11-14 NOTE — Discharge Summary (Signed)
NAMESIMRA, [name]           ACCOUNT NO.:  [id number]   MEDICAL RECORD NO.:  [id number]          PATIENT TYPE:  INP   LOCATION:  1430                         FACILITY:  [facility]   PHYSICIAN:  Bertram Millard. Dahlstedt, M.D.DATE OF BIRTH:  02/17/1961   DATE OF ADMISSION:  08/01/2007  DATE OF DISCHARGE:  08/03/2007                               DISCHARGE SUMMARY   PRIMARY DIAGNOSIS:  Right renal calculi.   SECONDARY DIAGNOSES:  1. Bipolar disorder.  2. History of congestive heart failure.  3. History of hypertension.  4. History of obesity.   PRINCIPAL PROCEDURE:  August 01, 2007 right percutaneous  nephrolithotomy.   BRIEF HISTORY:  This 50 year old female was admitted with right renal  calculi. These were quite large, and multiple. Due to the size and  location of these cones, it was felt that percutaneous nephrolithotomy  was the treatment of choice. She presents at this time for that  procedure. She had a double-J stent placed in the past for obstructive  symptoms with these stones.   PAST MEDICAL HISTORY:  Significant for renal calculi, hypertension,  congestive heart failure and bipolar disorder.   PAST SURGICAL HISTORY:  Significant for cesarean delivery and cystoscopy  with stent placement.   MEDICATIONS ON ADMISSION:  Aspirin and ibuprofen which have been stopped  a few days beforehand. She is on:  1. Seroquel 30 mg nightly.  2. Bactrim 1 p.o. daily.  3. Lithium 300 mg b.i.d.  4. Metoprolol 25 mg b.i.d.   For the total history and physical, please see the previously dictated  note.   HOSPITAL COURSE:  The patient was admitted directly to the operating  room from the radiology suite where she had undergone percutaneous  access to the right kidney by Dr. Denny Levy. She then underwent an  anesthetic, right percutaneous nephrolithotomy. She was rendered stone  free with this treatment.   Her postoperative course was unremarkable. Her postoperative KUB  revealed no  evidence of calculi remaining. Her nephrostomy tube was  clamped and then removed. Antegrade nephrostogram on August 03, 2007  revealed a patent system with no obstruction. She was discharged from  the hospital at this time. She was discharged on Percocet 1-2  p.o. q.4 h p.r.n. pain, Cipro 1 p.o. b.i.d. and her usual home  medications including metoprolol, Seroquel, lithium and p.r.n. aspirin  or ibuprofen.   She will followup in approximately 1 week. She was given activity and  dietary instructions.      Bertram Millard. Dahlstedt, M.D.  Electronically Signed     SMD/MEDQ  D:  08/21/2007  T:  08/21/2007  Job:  [id number]   cc:   Conni Elliot, M.D.

## 2010-11-14 NOTE — Op Note (Signed)
[name], [name]           ACCOUNT NO.:  [id number]   MEDICAL RECORD NO.:  [id number]          PATIENT TYPE:  INP   LOCATION:  5506                         FACILITY:  [facility]   PHYSICIAN:  Martina Sinner, MD DATE OF BIRTH:  [birth year]   DATE OF PROCEDURE:  06/06/2007  DATE OF DISCHARGE:  06/08/2007                               OPERATIVE REPORT   PREOPERATIVE DIAGNOSIS:  Right ureteral stones and right renal stones.   OPERATION:  Cystoscopy, right retrograde ureterogram, insertion of right  ureteral stent, cystoscopic removal of small bladder stone.   [name] has symptomatic hydronephrosis due to two stones in  her right ureter, a 6 mm stone at the right ureteropelvic junction and a  4 mm stone in the distal right ureter.  She has two larger stones in the  lower pole of the right kidney.   The patient was prepped and draped in the usual fashion.  A 21 [place]  scope was used for the examination.  Bladder mucosa and trigone were  normal.  There is no stitch, foreign body or carcinoma.  Using a 6  [place] open-ended ureteral catheter, it was placed cystoscopically in  the low right ureter.  I performed a right retrograde ureterogram using  approximately 8 mL of contrast.  One could see a filling defect at the  right ureteropelvic junction with a goblet sign.  She had mild  hydronephrosis and you could see stone shadows with a small staghorn  stone in the lower pole of the right kidney.   A sensor wire was easily passed under fluoroscopic guidance up into the  collecting system.  A 24 cm x 6 French stent was easily placed  cystoscopically and fluoroscopically into the right renal pelvis curling  up also into the bladder.   Upon insertion of the wire, stone fragments and a stone approximately 4  mm or longer in size exited the right ureter.  It was easily grabbed  with grasping forceps and removed and sent for stone analysis.   In summary, Mrs. Picking has a right  ureteral stent.  It is in good  position.  She likely has residual stone at the right ureteropelvic  junction, two stones in her kidney.  I am hoping that the distal right  ureteral stone has completely passed.   Right retrograde ureterogram:  Today [name] underwent a right  retrograde ureterogram, 7 or 8 mL of contrast was instilled in the right  ureter using open ended ureteral catheter.  A goblet sign was seen at  the right ureteropelvic junction.  I could see renal shadows in the  lower pole collecting system.  I do not have significant hydroureter and  only moderate hydronephrosis.  As described above, stent was easily  inserted, curling up into the kidney and bladder.          ______________________________  Martina Sinner, MD  Electronically Signed    SAM/MEDQ  D:  06/10/2007  T:  06/10/2007  Job:  [id number]

## 2010-11-14 NOTE — H&P (Signed)
NAMEADISSON, DEAK           ACCOUNT NO.:  [id number]   MEDICAL RECORD NO.:  [id number]          PATIENT TYPE:  INP   LOCATION:  5506                         FACILITY:  [facility]   PHYSICIAN:  Santiago Bumpers. Hensel, M.D.DATE OF BIRTH:  04-26-1961   DATE OF ADMISSION:  06/06/2007  DATE OF DISCHARGE:                              HISTORY & PHYSICAL   CHIEF COMPLAINT:  Abdominal pain.   HISTORY OF PRESENT ILLNESS:  A 50 year old female with a history of  hypertension, who passed a kidney stone 1 week ago, complained also with  a history of UTI 1 month ago, was treated with a shot of Rocephin and  Keflex for 21 days, comes in complaining of abdominal pain.  The patient  states the pain started 4 days ago, characterized the pain as constant,  dull pain that becomes sharp at times, starting in her right flank and  radiating into her groin along with epigastric/right upper quadrant  pain.  Also complains of vomiting, nonbilious, nonbloody and nausea.  Decreased appetite, more nausea after meals plus itching everywhere for  3 days.  Taking ibuprofen and Aleve has not helped the pain.  Percocet  has only helped a little.  Aggravated by laying on left side.  No  diarrhea.  Currently afebrile, although the patient does report chills  at home.  The patient reports some dysuria along with increased urinary  frequency and urgency but no vaginal discharge or malodor.   Note the patient also states she has stopped taking her metoprolol.  The  patient states she is on Seroquel and lithium for mood.   Abdominal CT yesterday showed gallstones, right-sided kidney stones,  including 1 in the UPJ and ovarian mass.   ALLERGIES:  ASPIRIN.   PAST MEDICAL HISTORY:  1. Question CHF.  2. Bipolar disease.  3. Hypertension.   FAMILY HISTORY:  Mom with diabetes. No history of coronary artery  disease or CHF.  Sister had bipolar, committed suicide in 2006.   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  History of alcohol abuse, denies  current drug use.  Lives with daughter [name] who is a Consulting civil engineer at [employer].   PHYSICAL EXAMINATION:  GENERAL:  The patient is generally tearful and  clearly uncomfortable in the room.  HEENT:  Moist mucous membranes.  Oropharynx clear without lesions or  exudates.  LUNGS:  Normal respiratory effort.  CHEST:  Expands symmetrically.  LUNGS:  Clear to auscultation.  No crackles or wheezes.  HEART:  Regular rate and rhythm.  S1 and S2 normal without gallop,  murmur, click or rub or extra sounds.  ABDOMEN:  Diffusely tender throughout abdomen especially along right  upper quadrant and suprapubically.  Positive CVA tenderness on right.  No CVA tenderness on the left.  Normoactive bowel sounds.  Soft and  obese.  No guarding.  Negative rebound.  Positive Murphy signs.  The  patient also has excoriations on the skin of her abdomen.  EXTREMITIES:  Slight edema.   IMPRESSION/PLAN:  1. Nephrolithiasis.  We will consult urology, start  Flomax and fluids.      Percocet for pain.  Lithium levels and creatinine are okay.  Can      switch to Toradol.  2. Cholelithiasis.  We will get a HIDA scan, check CMP and lipase.  3. Urinary tract infection.  We will get a catheter urine and then      Rocephin.  4. Bipolar disorder.  We will check lithium levels and __________ .      Can use Seroquel due for every 33-month fasting lipid panel.  So we      will check triglycerides.  5. Tobacco dependence.  We will get a cessation consult and nicotine      patch.  6. Hypertension.  We will continue metoprolol, lisinopril and      hydrochlorothiazide while the state of her kidneys is unknown.      Rolm Gala, M.D.  Electronically Signed      Santiago Bumpers. Leveda Anna, M.D.  Electronically Signed    HG/MEDQ  D:  06/06/2007  T:  06/07/2007  Job:  [id number]

## 2010-11-14 NOTE — H&P (Signed)
[name], [name]           ACCOUNT NO.:  [id number]   MEDICAL RECORD NO.:  [id number]          PATIENT TYPE:  INP   LOCATION:  1430                         FACILITY:  [facility]   PHYSICIAN:  Bertram Millard. Dahlstedt, M.D.DATE OF BIRTH:  01-17-1961   DATE OF ADMISSION:  08/01/2007  DATE OF DISCHARGE:                              HISTORY & PHYSICAL   REASON FOR ADMISSION:  Large right renal calculi.   BRIEF HISTORY:  This woman originally presented with right-sided  abdominal pain.  This was combined with flank pain.  Evaluation in the  hospital revealed a large right renal calculi.  She has infection as  well.  These are most likely infection stones.  She had a cystoscopy and  double-J stent placement to unobstruct the right kidney.  That was done  in December.  She was sent to see me by Dr. Jacquelyne Balint for consideration  of percutaneous nephrolithotomy.  I saw her in December and recommended  she have this procedure done.  She presents at this time for  establishing percutaneous access and percutaneous nephrolithotomy.   MEDICAL HISTORY:  1. Bipolar disorder.  2. CHF.  3. Hypertension.  4. Nephrolithiasis.   SURGICAL HISTORY:  1. Cesarean delivery.  2. History of cystoscopy with stent placement.   MEDICATIONS:  1. Metoprolol 25 mg b.i.d.  2. Lithium 300 mg b.i.d.  3. Bactrim 1 p.o. daily.  4. Seroquel 30 mg nightly.  5. Aspirin and ibuprofen.   ALLERGIES:  NO KNOWN DRUG ALLERGIES.   FAMILY HISTORY:  Significant for cancer, diabetes, hypertension and  heart disease, as well as stones.   SOCIAL HISTORY:  The patient is single, has a child.  She is a tobacco  smoker.   PHYSICAL EXAMINATION:  Obese female in no distress.  VITAL SIGNS:  Per chart.  HEENT:  Normal.  NECK:  Supple.  CHEST:  Clear.  HEART:  Normal rate and rhythm.  ABDOMEN:  Obese, nondistended, nontender.  No mass, no megaly.  EXTREMITIES:  Without edema.   Chest x-ray, bibasilar atelectasis and  infiltrates.  EKG, normal sinus  rhythm.  No acute change.   Hemogram was normal except for a white count of 13,000 and a platelet  count of 469,000.  BMET normal.   IMPRESSION:  Large right renal calculi with a proximal ureteral stone.   PLAN:  Admit for percutaneous nephrolithotomy.     ______________________________  Dr. Lonna Duval M. Dahlstedt, M.D.  Electronically Signed    Hadley Pen  D:  08/01/2007  T:  08/01/2007  Job:  [id number]   cc:   [facility]

## 2010-11-14 NOTE — Group Therapy Note (Signed)
[name], [name] NO.:  [id number]   MEDICAL RECORD NO.:  [id number]          PATIENT TYPE:  WOC   LOCATION:  [facility]                   FACILITY:  [facility]   PHYSICIAN:  Karlton Lemon, MD      DATE OF BIRTH:  Jan 27, 1961   DATE OF SERVICE:  11/07/2007                                  CLINIC NOTE   CHIEF COMPLAINT:  Referred by [facility] for an ovarian  mass.   HISTORY OF PRESENT ILLNESS:  This is a 50 year old gravida 3, para 1-0-2-  1, presenting with a history of a complex, septated ovarian cyst seen on  ultrasound in March.  She has had multiple medical problems recently  including cholelithiasis requiring gallbladder removal on Nov 05, 2007,  and a recent treatment for kidney stones, which she says were removed in  February 2009.  She has a previous ultrasound in December 2008 showing  two simple or minimally complex cysts seen in the left ovary, the  largest measuring 5.0 x 3.7 x 1.9 cm, the second measures 2.4 x 2.1 x  4.6 cm.  A follow-up ultrasound report in March shows a complex,  septated left ovarian cyst measuring 6.8 x 5.4 x 6.5 cm.  The patient  currently does have abdominal pain but is postoperative day #2 from a  laparoscopic cholecystectomy.  She complains of her pain in the  suprapubic region.  She does have regular menstrual periods, her last  being August 12, 2007.  She states for the past year she has had  periods about 60 days apart that last for 7-10 days, requiring 4-5 pads  on her heaviest day.  Prior to a year ago, she was having monthly  periods that lasted 3 days.  She currently has no other complaints  today.   PAST MEDICAL HISTORY:  1. Hypertension.  2. Bipolar disorder.  3. Morbid obesity.  4. Uterine fibroid.  5. Left ovarian cyst as stated in the HPI.  6. Cholelithiasis, now status post cholecystectomy, postoperative day      #2.   PAST OBSTETRICAL HISTORY:  The patient is a gravida 3, para 1-0-2-1,  delivered by cesarean section at term.   GYNECOLOGIC HISTORY:  The patient reports no abnormal Pap smears or  breast examinations but has not had a Pap smear in 4-5 years and has  never had a mammogram.   SURGICAL HISTORY:  1. The patient has had a cholecystectomy as stated above.  2. C-section in 1987.  3. Kidney stent placement in December 2009.  4. Kidney stone removal in February 2009.   FAMILY HISTORY:  The patient's mother has diabetes, heart disease,  coronary artery disease, hypertension and unspecified cancer.  She has a  sister with blood clots.   MEDICATIONS:  1. Vicodin 1 tablet by mouth as needed.  2. Lithium 300 mg p.o. t.i.d.  3. Seroquel 300 mg p.o. nightly.  4. Metoprolol 25 mg, dosing is not stated.  5. Aspirin 81 mg 1 tablet daily.   ALLERGIES:  No known diagnosed allergies.   SOCIAL HISTORY:  The patient does work and denies alcohol, drug or  tobacco use.  She does drink caffeine beverages.   REVIEW OF SYSTEMS:  The patient does complain of fatigue, headaches,  dizzy spells, muscle aches, fevers, night sweats, bruising, nausea,  vomiting and coughing up phlegm.  The remainder of her review of systems  is otherwise negative unless stated in the history of present illness.   PHYSICAL EXAMINATION:  GENERAL:  This is a well-appearing, morbidly  obese female in no distress.  VITALS:  Temperature 97.8, pulse 92, respirations 16, blood pressure  126/71, weight 250.0 pounds, height 5 feet 3 inches.  CARDIOVASCULAR:  Heart is regular rate and rhythm.  No murmurs, rubs or  gallops.  RESPIRATORY:  Lungs are clear auscultation bilaterally.  BREASTS:  Symmetric without mass, lump dimpling or nipple discharge.  ABDOMEN:  Soft, mild to moderate tenderness in the right upper quadrant.  No rebound tenderness or guarding.  Abdomen is obese.  There are Steri-  Strips covering laparoscopy port sites, three in the right upper  quadrant, one in the epigastric area and one in  the umbilicus.  There is  mild tenderness in the suprapubic region.  GENITOURINARY:  Vaginal mucosa is pink and moist.  There is no  discharge.  The cervix is midline.  There is a small amount of bloody-  appearing discharge.  Pap smear is collected.  On bimanual exam the  uterus feels midline.  Due to the patient's body habitus it is difficult  to palpate the uterus or the adnexa.  There is no discrete tenderness  during palpation.  EXTREMITIES:  No tenderness or edema in the bilateral lower extremities.   ASSESSMENT/PLAN:  This 50 year old gravida 3, para 1-0-2-1, here for her  annual examination and evaluation for a left cystic, septated ovarian  mass.   1. The patient has had a well-woman examination, breast exam appears      normal.  Pap smear is collected.  She has been referred for      mammogram.  2. We will check a CA-125 level due to her left ovarian mass.  3. She is to follow up in 2 weeks for a preoperative examination and      will likely be scheduled for a laparotomy to remove this left      ovary.           ______________________________  Karlton Lemon, MD     NS/MEDQ  D:  11/07/2007  T:  11/07/2007  Job:  [id number]

## 2010-11-14 NOTE — Discharge Summary (Signed)
NAMEDIM, MEISINGER           ACCOUNT NO.:  [id number]   MEDICAL RECORD NO.:  [id number]          PATIENT TYPE:  INP   LOCATION:                                [facility]   PHYSICIAN:  Ginger Carne, MD  DATE OF BIRTH:  1960-12-18   DATE OF ADMISSION:  01/19/2008  DATE OF DISCHARGE:  01/21/2008                               DISCHARGE SUMMARY   REASON FOR HOSPITALIZATION:  A 6-7 cm complex left ovarian cyst.   IN-HOSPITAL PROCEDURES:  Left salpingo-oophorectomy and peritoneal  washings.   FINAL DIAGNOSIS:  Benign left ovarian cyst by frozen section.   HOSPITAL COURSE:  This patient is a 50 year old Caucasian female who  underwent on January 19, 2008, a laparotomy with left salpingo-oophorectomy  and peritoneal washings.  The patient has been followed since April  2009, with an enlarging left adnexal mass.  The mass was adherent to its  respective sidewall and ureter was carefully dissected throughout its  pelvic course during her operation.   Intraoperative frozen section revealed benign cyst.  Her postoperative  hemoglobin was 12.4 and creatinine was 0.87.  Her incision was dry.  Abdomen soft.  Lungs clear.  Calves without tenderness bilaterally, and  the patient was discharged with routine instructions including  contacting the office for temperature elevation above 104 degrees  Fahrenheit, increasing abdominal or incisional pain, incisional  drainage, erythema, or increasing pain, and any GI or GU complaints.  She was prescribed Percocet 5/325 one-two every 4-6 hours as needed for  pain.  She was also asked to continue her home medications which  included Seroquel 300 mg 2 tablets at night, lithium 150 mg tablets 3  times a day, metoprolol 25 mg twice a day, and a baby aspirin daily.  She will return on January 28, 2008, for followup at the [facility] for  staple removal.  All questions answered to the satisfaction of the  patient, and the patient verbalized  understanding of same.      Ginger Carne, MD  Electronically Signed     SHB/MEDQ  D:  01/21/2008  T:  01/21/2008  Job:  [id number]

## 2010-11-14 NOTE — Consult Note (Signed)
NAMEBREELYN, ICARD           ACCOUNT NO.:  [id number]   MEDICAL RECORD NO.:  [id number]          PATIENT TYPE:  INP   LOCATION:  4709                         FACILITY:  [facility]   PHYSICIAN:  Bertram Millard. Dahlstedt, M.D.DATE OF BIRTH:  07-12-1960   DATE OF CONSULTATION:  06/20/2007  DATE OF DISCHARGE:  06/21/2007                                 CONSULTATION   REASON FOR CONSULTATION:  Kidney stones, elevating creatinine.   BRIEF HISTORY:  A 50 year old female who has been seen by [place] twice for  kidney stones.  She was originally seen by Dr. Sherron Monday of [facility] for a right ureteral stone, right kidney stones and  a left lower pole stone.  This was early this month.  She had a double J  stent placed at that time.  She had a fairly large stone burden on the  right.  Due to the patient's size, the size and location of the stones,  it was recommended that she be scheduled for percutaneous  nephrolithotomy.  This is a procedure which combines the surgeon and the  radiologist.  The surgery is still pending.  The surgery will most  likely be done in mid January.  She has been temporized with a double J  stent on the right side, alleviating obstruction from her right ureteral  stone.   I saw her in the office on the 15th of this month.  Her culture from  that time revealed growth of mixed organisms (she had multiple squamous  cells on the voided specimen).  She was called in  sulfamethoxazole/trimethoprim which she has not gotten filled and taken  yet.   She was admitted to the hospital yesterday with altered mentation.  Creatinine was 2.0.  She was admitted for dehydration.   The patient is feeling better at the present time.  Recent ultrasound  revealed no hydronephrosis, large stones in the right lower pole and a  small left lower pole stone.  Urologic consultation is requested.   Her medical history is significant for nephrolithiasis.  She has a large  gallbladder stone for which treatment is on hold until after the renal  stones get treated.  She has bipolar disorder, chronic venous  insufficiency, a history of CHF.   CURRENT MEDICATIONS:  Rocephin IV, Seroquel, nicotine patch, morphine  sulfate.   EXAMINATION:  Was not performed.   IMPRESSION:  1. Bilateral renal calculi.  2. Right ureteral calculus, status post stenting.  3. Question urinary tract infection.  Most recent culture and      sensitivity did not grow out any specific organism.  4. Renal insufficiency, most likely due to dehydration, improving.   PLAN:  1. At this point, I let her know that I think we still should proceed      with the surgery planned in January.  I see no reason to move that      up - that would be impossible, as the OR schedule at [facility] is      booked up to the next 2-3 weeks.  2. I have let her know that she should  take the Bactrim that I sent      in, one twice daily for 7 days and one daily until the procedures      are performed.  3. We will make sure that this procedure is scheduled - she has not      been at home, so I am sure that my surgery scheduler has not been      able to contact her.  4. As far as I am concerned, she can be discharged at any point - the      Bactrim has been called in to her regular pharmacy.      Bertram Millard. Dahlstedt, M.D.  Electronically Signed     SMD/MEDQ  D:  06/20/2007  T:  06/22/2007  Job:  [id number]   cc:   Rolm Gala, M.D.

## 2010-11-14 NOTE — H&P (Signed)
NAMEBRYNLYN, [name]           ACCOUNT NO.:  [id number]   MEDICAL RECORD NO.:  [id number]          PATIENT TYPE:  INP   LOCATION:  4709                         FACILITY:  [facility]   PHYSICIAN:  Pearlean Brownie, M.D.DATE OF BIRTH:  Aug 17, 1960   DATE OF ADMISSION:  06/19/2007  DATE OF DISCHARGE:                              HISTORY & PHYSICAL   This patient is a 50 year old female who comes in complaining of not  acting quite right and poor pain control.   PROBLEMS:  1. Poor pain control:  The patient has recently admitted for      nephrolithiasis with multiple stones in her right ureter, 5-6 mm.      Urology says that she needs the stones removed, but they cannot do      it until January.  The pain is 5-10/10, currently 8/10.  Patient      has been taking Percocet, scheduled to switch to morphine for      better pain control but stopped taking that because it made her too      sleepy.  The pain, she says, is so horrible.  She is tearful in the      room.  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      She has been taking Cipro without problem, as prescribed.  She has      had fever and chills at home.  She is still smoking.  Her daughter      is helping her at home.  2. Altered mental status, not acting right.  Patient has a history of      bipolar.  She had been off her lithium after the hospital but been      still taking her Seroquel.  Off the lithium, she started hearing      voices again, which she has not heard in years.  She did not see      anything.  We restarted her lithium at a lower dose b.i.d. instead      of t.i.d.  Now her daughter reports that the patient is taking      longer to answer questions.  Patient will ask the same questions      three times in a row.  The patient has forgotten to eat the last      two days, including this morning.  She drank two [facility]      yesterday, one today, no other liquids.  Blood pressure was low at  urology yesterday and again is low here in the clinic.  Initially,      it was 96/40 and on repeat, it was 92/40.  The patient feels quite      dizzy.  She also complains of a cough and has occasional nausea.   PAST MEDICAL HISTORY:  1. Nephrolithiasis.  2. Cholelithiasis, diagnosed on ultrasound June 06, 2007.  3. Bipolar disorder.  4. Chronic venous insufficiency.  5. Tobacco dependence.  6. Hypertension.  7. Low back pain.  8. Question CHF.  Patient had an echo that showed a decreased EF in  2002.   FAMILY HISTORY:  Mom has diabetes.  No coronary artery disease or CHF in  the family.  Sister with bipolar, killed herself in 2006.   SOCIAL HISTORY:  She smoked one pack per day for 22 years.  She is a  Electrical engineer at [employer].  History of alcoholism but denies  any current drug use.  Lives with her daughter, [name], who is usually a  Consulting civil engineer at [employer] but is home for the holidays.   PHYSICAL EXAMINATION:  VITAL SIGNS:  Height 62 inches.  Weight 253  pounds.  Temp 99.3, blood pressure initially 96/48, on repeat it was  92/40.  Patient is tearful.  Takes longer to answer questions than usual.  Looks  to her daughter to answer simple questions.  Usually she speaks quite  clearly for herself.  Head is normocephalic and atraumatic without obvious abnormalities.  No  apparent alopecia or balding.  Mucous membranes are dry.  LUNGS:  Decreased breath sounds bilaterally.  Crackles at the left base.  HEART:  Regular rate and rhythm.  Normal S2 without gallop, murmur, rub,  or extra sounds.  Patient has flank tenderness on the right, none on the left.  Positive  bowel sounds.  No abdominal tenderness.  EXTREMITIES:  Edema is 1+ bilaterally, which is worse than usual.   ASSESSMENT/PLAN:  1. Hypotension: Concern for sepsis.  Will hold metoprolol.  Will get      chest x-ray to evaluate for pneumonia with crackles on exam.  Her      last urine culture grew E. coli resistant  to Cipro.  Had been on      Rocephin initially, but urology switched her to Cipro.  We will      restart Rocephin.  The hypotension may also be due to dehydration.      We will give IV fluids.  Even with a history of CHF, the patient      has only had three sodas in greater than 24 hours.  We will check a      BNP now in the a.m. to follow for fluid overload.  2. Altered mental status:  Concern for renal failure secondary to      dehydration and kidney stones causing toxic lithium levels.  Will      check lithium levels and hold the lithium for now.  Altered mental      status could also be due to dehydration.  3. Bipolar disorder:  We are holding lithium levels until back.  Will      continue Seroquel.  4. Hypertension:  Will hold metoprolol, as patient is currently      hypotensive.  5. Tobacco dependence:  Patient was advised to quit.  Smoking      cessation consult will be obtained in the hospital.  We will give      her a 21 mcg nicotine patch.      Rolm Gala, M.D.  Electronically Signed      Pearlean Brownie, M.D.  Electronically Signed    HG/MEDQ  D:  06/19/2007  T:  06/19/2007  Job:  [id number]

## 2010-11-14 NOTE — Discharge Summary (Signed)
NAMEHELLEN, [name]           ACCOUNT NO.:  [id number]   MEDICAL RECORD NO.:  [id number]          PATIENT TYPE:  INP   LOCATION:  4709                         FACILITY:  [facility]   PHYSICIAN:  Nestor Ramp, MD        DATE OF BIRTH:  [birth year]   DATE OF ADMISSION:  06/19/2007  DATE OF DISCHARGE:  06/21/2007                               DISCHARGE SUMMARY   ADMISSION DIAGNOSES:  1. Uncontrolled pain secondary to nephrolithiasis.  2. Altered mental status.   OTHER MEDICAL PROBLEMS:  1. Nephrolithiasis.  2. Cholelithiasis diagnosed on ultrasound on June 06, 2007.  3. Bipolar disorder.  4. Chronic venous insufficiency.  5. Tobacco dependence.  6. Hypertension.  7. History of low back pain.  8. Possible congestive heart failure with an echocardiogram that      showed decreased ejection fraction in 2002.   BRIEF HOSPITAL COURSE:  The patient is a 50 year old female with a  history of bipolar disorder who comes in after a recent discharge for  uncontrolled flank pain as an outpatient, hypotension, and increased  white count to 16.  Of note, the patient has multiple kidney stones and  is status post stenting on June 06, 2007.  Abdominal CT scan  performed on June 16, 2007, on an ER visit showed that stent in the  right ureter was patent, and no obstruction on either side.  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  She is complaining on this  admission primarily of right-sided flank pain.  After her last  admission, she was discharged from the [facility] with  instructions from Urology to take ciprofloxacin.  However, urine culture  from that visit returned with E. coli that was resistant to  ciprofloxacin.  Accordingly on this admission Dr. Gavin Potters her admitting  physician, placed the patient on an IV Rocephin q.24 h.  Given her  initially low blood pressures down to the 90s/40  systolic, the patient  was bolused with 2 L of IV fluids and then placed on 125 mL/hr of  continuous fluids.  Initially, the patient's creatinine was 2.5; this is  considerably above the patient's baseline; she was discharged from her  last admission at 0.8.  On June 16, 2007, when she showed up in the  ER for continued pain, her creatinine at that time was 1.2, so this  creatinine represents acute renal failure, likely secondary to multiple  stones in her kidneys.  While in the hospital, the patient received a  consult from Urology.  They agreed with our treatment plan and  recommended to discharge the patient on oral Bactrim as an outpatient.  She is scheduled to receive stone removal surgically in the middle of  January.  According to Urology, they will continue with this scheduled  surgery and do not see a need at this time to intervene surgically.  While in the hospital, the patient had a good response to IV fluids.  On  hospital day #2, the patient's blood pressures responded  up to the 120s-  140s and 150s systolic on continuous fluids.  The patient had a good  control of her pain with IV morphine 2 mg q.4 h. p.r.n.  The patient did  not require all of her p.r.n. dosing.  On the date of discharge, the  patient's blood pressure continued to be normal to elevated with no  recurrences of hypotension after her IV fluids were stopped.  The  patient had a T-max during this hospital admission of 99.2 and at the  time of discharge, she was afebrile and had been so for the duration of  her hospital stay.  The patient's oxygen saturations were maintained in  the mid-to-high 90s on room air throughout her hospital admission as  well.  On the date of discharge, the patient received her dose of IV  ceftriaxone and was given a prescription for Bactrim double strength 1  by mouth daily to treat for 12 more days to complete a 14-day course  from the time of this admission.  Please note that the  last urine  culture that grew up bacteria was sensitive to trimethoprim and  sulfamethoxazole.  The patient was discharged with morphine sulfate  tablets 2 mg 1-2 pills by mouth q.4 h. p.r.n. pain.  The patient had  been maintained on this dose of pain medicine on the day of discharge  without difficulty.  The patient was discharged with instructions to  follow up with Dr. Gavin Potters early next week in [facility].  She was also instructed to return to the ER or other medical  facility if she had uncontrolled pain, fevers, chills, increased  temperature, altered mental status, or any hallucinations.  In the  meantime, the patient expressed agreement in understanding with these  instructions.  The patient's daughter was present for most of her  hospital stay and was also made aware of these instructions and  expressed agreement and understanding with these instructions as well.  Please note given the patient's high creatinine on this admission, the  patient's lithium was initially held.  She did well throughout her  hospital stay and on the date of discharge, her lithium was restarted  given that its level was 0.91 and nontoxic and given the fact that her  creatinine had fallen to 0.9 on the day of discharge.  It was therefore  felt that the lithium was not the cause of the patient's increased  creatinine.  However, if the stones do cause further renal failure, this  lithium could build up in the patient's blood and reach toxic levels.  It is felt, however, given the patient's poor control of her bipolar  disorder off of lithium that benefit outweighs the risk for placing the  patient back on this medicine.  The patient was placed back on oral  lithium on June 21, 2007, prior to discharge and had no  complications.  Also given the patient's increased creatinine, her  enalapril was held during this admission.  This medicine will not be  restarted as an outpatient, we will  refer to Dr. Gavin Potters for further  management of this medication.  Given that the patient's blood pressures  had responded well to IV rehydration and continued to remain normal to  elevated with no further IV fluids, the patient's metoprolol was  restarted at discharge.   SIGNIFICANT FINDINGS:  1. Renal ultrasound performed on June 19, 2007, showed two 13-mm      calculi within the right kidney  without evidence of hydronephrosis      and a 2.5-cm gallstone with thickened gallbladder wall with no      evidence of sonographic Murphy sign.  2. Chest x-ray showed bibasilar atelectasis and stable cardiomegaly.   ADMISSION LABS:  The patient was admitted with sodium of 133, potassium  of 3.8, and creatinine of 2.5.  Lithium on admission was 0.91.  Beta  natriuretic peptide was less than 30.  CBC with diff on admission showed  white blood cell count of 16.1, hemoglobin of 11.0, platelet count of  368,000 with 78% neutrophils.  Blood cultures performed on admission  showed no growth at the time of discharge.  Urinalysis performed on  hospital day #2 showed large leukocyte esterase, large blood, but was  otherwise clear.  Urine microscopy on the same sample showed white blood  cells of 11-20 per high power field and 21-50 red blood cells per high  power field with few bacteria and few squamous cells.   DISCHARGE LABS:  On the date of discharge, the patient had a white blood  cell count of 11.9, hemoglobin of 11.0, and platelet count of 406,000.  Electrolytes for this patient showed sodium of 142, potassium of 3.8,  and creatinine of 0.95.  Again, the patient remained afebrile throughout  her hospital stay with a T-max of 99.2.   Labs pending at the time of discharge:  Currently, the patient has a  urine culture pending performed on the sample drawn on hospital day #2.  She is discharged on Bactrim.  If culture and sensitivity indicates that  this is not an appropriate antibiotic therapy,  we will call the patient  and give her a new antibiotic to complete her antibiotic regimen for  this urinary tract infection.   CONTINUED MEDICATIONS:  1. Lithium 150 mg by mouth twice daily.  2. Seroquel 600 mg at night.  3. Metoprolol 25 mg by mouth once daily.   NEW MEDICATIONS:  1. Bactrim double strength 1 pill twice daily for 12 more days.  2. Morphine sulfate 2 mg tablets to take 1-2 tablets every 4 hours as      needed for pain.   DISCONTINUED MEDICATION:  Lisinopril.   DISCHARGE INSTRUCTIONS:  1. The patient is to take medications as prescribed previously.  2. The patient is to schedule an appointment to see Dr. Gavin Potters early      next week in [facility].  The patient and      daughter expressed agreement in understanding with these      instructions.  Phone numbers of the family practice were given to      the patient on her discharge instruction paperwork.  3. The patient is to come back to the ER or clinic for any altered      mental status, fevers, uncontrolled pain, hallucinations, or other      concerning symptoms.  The patient expressed an agreement in      understanding with these instructions.  4. The patient is scheduled for surgery in the middle of January per      Urology, and they do not feel that the patient's clinical status at      this time merits sooner intervention.   DISCHARGE CONDITION:  Stable.   DISPOSITION:  The patient is discharged to home with instructions to  return for any concerning symptoms and instructions to follow up with  Dr. Gavin Potters early next week before the Christmas holiday.  The patient  expressed agreement in understanding with these instructions.      Myrtie Soman, MD  Electronically Signed      Nestor Ramp, MD  Electronically Signed    TE/MEDQ  D:  06/21/2007  T:  06/22/2007  Job:  [id number]

## 2010-11-14 NOTE — Discharge Summary (Signed)
NAMECYNTHEA, [name]           ACCOUNT NO.:  [id number]   MEDICAL RECORD NO.:  [id number]          PATIENT TYPE:  WOC   LOCATION:  WOC                          FACILITY:  [facility]   PHYSICIAN:  Ginger Carne, MD  DATE OF BIRTH:  [birth year]   DATE OF ADMISSION:  DATE OF DISCHARGE:                               DISCHARGE SUMMARY   REASON FOR HOSPITALIZATION:  Postoperative wound cellulitis and  subcutaneous abscess.   IN-HOSPITAL PROCEDURES:  Incision and drainage of postoperative wound  abscess and cellulitis.   FINAL DIAGNOSIS:  Postoperative wound cellulitis with subcutaneous  abscess.   HOSPITAL COURSE:  This is a 50 year old Caucasian female who underwent  on January 19, 2008 a laparotomy and removal of approximately 7-cm benign  cyst adenofibroma.  She had undergone a left salpingo-oophorectomy.  The  mass had measured 10 x 9 x 6 cm.  Her postoperative course was  unremarkable and she was discharged home on January 21, 2008.  The patient  was seen in the Maternity Admission Unit on February 02, 2008, for what  appeared to be a cellulitis by Dr. Silas Flood.  She was placed on Septra  double strength and asked to follow up in GYN Clinic.  She returned in  the early morning of February 07, 2008 to [place] and then transferred  to [facility] with significant cellulitis of the wound, which was  a high Pfannenstiel incision.  A CT scan of the abdomen and pelvis  revealed an intact fascia, but an abscess that measured approximately 14  cm in the subcutaneous layer.  The patient was taken in the early  evening to the operating room on February 07, 2008, and an incision and  drainage of said abscess was performed by opening said wound.  Purulent  material was obtained and was cultured for anaerobic and aerobic  organisms.  To date, there was no evidence for MRSA and the report has  only demonstrated gram staining indicating gram-positive cocci in pairs  and in clusters.  The patient was  placed initially on Zosyn and  vancomycin, and this was continued until the morning of her discharge.  Daily dressing changes were performed with the guidance of the wound  care specialists.  They had seen the patient on the morning of February 09, 2008.  She was placed on Aquacel Ag for daily dressing changes.  The  patient responded well to this management and good granulation tissue  was observed in the wound site.  Fascia was intact, noted both  intraoperatively as well as postoperatively.  The patient was afebrile  throughout her course.  Pharmacy was requested to manage her vancomycin  dosing.  The cellulitis and induration initially observed in the upper  aspect of the wound, had significantly abated by the time of discharge.  The patient was discharged with routine postoperative instructions  including contacting the office for temperature elevation above 100.4  degrees Fahrenheit, increasing incisional drainage, bleeding, or  increasing incisional pain.  The [facility] for daily dressing changes and employing Aquacel Ag on a daily  basis.  She was encouraged to shower before changes.  She was prescribed  Bactrim double strength one twice a day for 14 days and in addition,  Percocet 5/325 was continued 1-2 every 4-6 hours.  She will also  continue on her home medications including lithium carbonate 300 mg 3  times a day, Seroquel 300 mg twice a day, metoprolol  tartrate 25 mg twice a day, and one daily aspirin (81 mg).  The patient  will be set up by the nursing staff before discharge to see me in 10  days to 2 weeks in the GYN Clinic.  All questions answered to the  satisfaction of said patient.  The patient verbalized understanding of  all instructions.      Ginger Carne, MD  Electronically Signed     SHB/MEDQ  D:  02/11/2008  T:  02/11/2008  Job:  [id number]

## 2010-11-14 NOTE — Op Note (Signed)
NAMEJASMINE, [name]           ACCOUNT NO.:  [id number]   MEDICAL RECORD NO.:  [id number]          PATIENT TYPE:  AMB   LOCATION:  DAY                          FACILITY:  [facility]   PHYSICIAN:  Anselm Pancoast. Weatherly, M.D.DATE OF BIRTH:  1961/02/13   DATE OF PROCEDURE:  11/04/2007  DATE OF DISCHARGE:                               OPERATIVE REPORT   PREOPERATIVE DIAGNOSIS:  Chronic cholecystitis with stones.   POSTOPERATIVE DIAGNOSIS:  Subacute cholecystitis with stones, exogenous  obesity, history of bipolar.   OPERATION:  Laparoscopic cholecystectomy with cholangiogram.   SURGEON:  Anselm Pancoast. Zachery Dakins, M.D.   ASSISTANT:  Lennie Muckle, MD   ANESTHESIA:  General anesthesia.   HISTORY:  [name] is a 50 year old white female who was  referred to our office by Dr. Casimiro [facility] where she is  managed with kidney problems, had a percutaneous nephrostomy by Dr.  Retta Diones and she has had a C-section in 1987.  Her daughter is home  with her at this time and she has had episodes of epigastric pain  thought to be related to gallstones and a gallstone was identified on a  CT when she was having problems with her kidney stones.  I saw her in  the office approximately three weeks ago and she is here today for the  planned procedure.  The patient is on chronic medications.  She is on  lithium with alcohol dependence and high blood pressure.   DESCRIPTION OF PROCEDURE:  The patient preoperatively was given 3 g of  Unasyn.  She has PAS stockings and was taken to the operative suite.  Induction of general anesthesia.  The abdomen was prepped with Betadine  solution and draped in sterile manner.  She is about 285 pounds and 5  feet 3 inches in height.  The patient was taken to the operative suite,  positioned after  induction general anesthesia with endotracheal tube,  oral tube into the stomach and then draped in sterile manner after  prepping.  A small incision above  the umbilicus was made down through  the approximately 3 inches of adipose tissue.  The fascia was identified  and this was picked up between two Kochers and a small opening made.  The underlying peritoneum was identified and I opened through it with a  Tresa Endo.  The Hasson cannula was placed after pursestring suture in the  fascia and then the gallbladder could be visualized but she has got a  very fatty liver and the gallbladder itself is contracted and subacutely  inflamed.  The upper 10 mL trocar was placed in the subxiphoid area  under direct vision and the two lateral 5 mm trocars were placed in the  appropriate position by Dr. Freida Busman.  On trying to grasp the gallbladder,  it is contracted, so it was difficult to grasp with the numerous stones  and we kind of worked to prop the gallbladder up so the adhesions in the  proximal portion of the gallbladder could be freed up and then we could  get just a little bit of the gallbladder just proximal to a stone caught  in the neck of the gallbladder.  I then was able to open the peritoneal  reflection superiorly and inferiorly and get [place] a little working room.  Fortunately, we are able to see the cystic duct which was encompassed  with a right angle and a clip was placed on the junction of the cystic  duct and gallbladder. A small opening made, a Cook catheter was  introduced, held in place with a clip and then the cholangiogram was  obtained and she has got a kind of a chronically dilated extrahepatic  biliary system, but there is good flow going into the duodenum and it  tapers down nicely.  It does not appear having any problems with common  duct stones.  The clip catheter was removed and the cystic duct was  triply clipped and then divided.  Then I could identify the anterior  branch of the cystic artery; it was doubly clipped proximally, singlely  and distally, divided and then I think we also visualized the little  posterior branch, it   was doubly clipped but I placed  no clip on the  distal portion.  Then using the hook electrocautery initially and then  switching to the spatula, we just carved  this gallbladder out of its  kind of contracted subhepatic area.  There was a little venous bleeding  up in the more distal portion where the gallbladder was nearly  intrahepatic and the bile and all that was in the gallbladder is just  kind of chronic stasis and infection.  I did get into the gallbladder as  we were coring out the area of the gallbladder bed and placed it into an  EndoCatch bag.  With good heavy cauterization of the bed, there was good  hemostasis and then we switched the camera to the upper 10 mL port,  withdrew the gallbladder within the bag through the fascia just opening  it just slightly and then placed the  Hasson cannula back.  On  reinspection of gallbladder bed, there was no evidence of any bleeding.  We irrigated, aspirated and everything was returning clear and then we  switched the camera back to the upper 10 mm port and put an additional  large figure-of-eight suture of 0 Vicryl in the fascia in addition to  the pursestring and then tied both these.  I then anesthetized the  fascia in the midline and looked at it from the inside and there was no  evidence of bleeding.  We then removed the 5 mm ports after removing the  irrigating fluid under direct vision and then closed the subcutaneous  wounds with 4-0 Vicryl after the CO2 had been released and benzoin and  Steri-Strips on skin.  The patient is going to spend the night and go  home in the morning and hopefully will do nicely.           ______________________________  Anselm Pancoast. Zachery Dakins, M.D.     WJW/MEDQ  D:  11/04/2007  T:  11/04/2007  Job:  [id number]   cc:   Sibyl Parr. Darrick Penna, M.D.

## 2010-11-14 NOTE — Op Note (Signed)
[name], [name]           ACCOUNT NO.:  [id number]   MEDICAL RECORD NO.:  [id number]          PATIENT TYPE:  WOC   LOCATION:  WOC                          FACILITY:  [facility]   PHYSICIAN:  Ginger Carne, MD  DATE OF BIRTH:  09-02-1960   DATE OF PROCEDURE:  02/07/2008  DATE OF DISCHARGE:                               OPERATIVE REPORT   PREOPERATIVE DIAGNOSES:  Postoperative wound cellulitis, probable  subcutaneous abscess.   POSTOPERATIVE DIAGNOSES:  Postoperative wound cellulitis and  subcutaneous abscess.   PROCEDURE:  Incision and drainage of postoperative wound infection   SURGEON:  Ginger Carne, MD   ASSISTANT:  Norton Blizzard, MD   ESTIMATED BLOOD LOSS:  Minimal.   COMPLICATIONS:  None immediate.   ANESTHESIA:  General.   SPECIMEN:  Anaerobic and aerobic cultures.   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  Her initial surgery on January 19, 2008, she had returned 6 days prior to the maternity admission unit,  seen and placed on Sulfa.  She had returned early morning of February 07, 2008, with significant erythema and pain above her incision site.  CT  scan revealed what appeared to be a 14 cm abscess in the subcutaneous  tissue.  Upon opening said wound purulent material was noted to emanate  from said wound.  Anaerobic and aerobic cultures were obtained.  The  fascia was intact.  The entire wound was explored and purulent material  consistent with a 14 cm abscess was noted.  No loculations were left and  the patient's wound was, otherwise, normal in appearance.  There  appeared to be good blood supply on both upper and lower portions of the  said incision.   OPERATIVE PROCEDURE:  The patient was prepped and draped in usual  fashion and placed in the supine position.  Betadine solution used for  antiseptic and the  patient was catheterized prior to procedure.  After  adequate general anesthesia, the wound was opened and the drainage of  subcutaneous purulent material followed.  Digital examination was then  performed to establish integrity of the fascia and to take down any  loculations.  Copious  irrigation lactated Ringer's followed.  Bleeding points hemostatically  checked.  The wound was then packed with wet Kerlix followed by dry  4x4's.  The patient tolerated the procedure well and returned to the  post anesthesia recovery room in excellent condition.      Ginger Carne, MD  Electronically Signed     SHB/MEDQ  D:  02/07/2008  T:  02/08/2008  Job:  [id number]

## 2010-11-14 NOTE — Op Note (Signed)
[name], [name]           ACCOUNT NO.:  [id number]   MEDICAL RECORD NO.:  [id number]          PATIENT TYPE:  INP   LOCATION:  9302                          FACILITY:  [facility]   PHYSICIAN:  Ginger Carne, MD  DATE OF BIRTH:  [birth year]   DATE OF PROCEDURE:  01/19/2008  DATE OF DISCHARGE:                               OPERATIVE REPORT   PREOPERATIVE DIAGNOSIS:  Left ovarian cyst.   POSTOPERATIVE DIAGNOSIS:  Left ovarian cyst.   PROCEDURE:  Left salpingo-oophorectomy and peritoneal washings.   SURGEON:  Ginger Carne, MD   ASSISTANT:  Javier Glazier. Rose, MD   ESTIMATED BLOOD LOSS:  100 mL.   COMPLICATIONS:  None immediate.   SPECIMEN:  Left tube and ovary.   DISPOSITION:  Sent to Pathology.   OPERATIVE FINDINGS:  Upon opening the abdomen, a 7-cm left ovarian cyst  was noted with the left tube draped around said cyst.  The surface of  the ovary revealed no evidence of excrescences.  There was no ascitic  fluid noted.  The omentum felt smooth.  Large and small bowel grossly  normal.  Upper abdomen revealed smooth liver edge.  No gross evidence of  implants in the peritoneal cavity was noted.  The uterus was normal  sized.  The right ovary was small and normal in appearance and the right  tube was normal.  The left ureter was slightly enlarged secondary to  compression of said ovarian cyst, but no obstructive pattern was noted.  There was an area of oozing above the left ureter as it entered the  cardinal ligament, and this was dealt with  Avitene and pressure.  After  the procedure, there was no active bleeding noted in this area.  Frozen  section of the left ovary was reported as benign by the pathologist.  The uterus was otherwise normal in size, appearance, and contour.  Wound  classification is 1.   OPERATIVE PROCEDURE:  The patient was prepped and draped in the usual  fashion and placed in the supine position, Betadine solution used for  antiseptic, and the  patient was catheterized prior to procedure.  After  adequate general anesthesia, a Pfannenstiel incision was made and the  abdomen opened.  Self-retaining retractors using Bookwalter system was  utilized.  The left pelvic retroperitoneum was opened after dividing and  cauterizing left round ligament.  At this point, the ureter was  identified beneath the left ovary.  Care and attention was continued  throughout the case to assure no injury to said structure.  The left  infundibulopelvic ligament was clamped, cut, and ligated twice with  transfixation sutures.  No active bleeding noted in this area.  Afterwards, the left isthmus portion of the tube and left utero-ovarian  ligament were clamped, cut, and ligated with 0 Vicryl suture.  No active  bleeding noted in this region.  As described above, the left tube and  ovary were immediately sent for frozen section.  Following this,  bleeding points were meticulously hemostatically checked.  As described  above, a small area immediately above the left ureter as it entered the  left cardinal ligament had a small amount of oozing, which was best  controlled with Avitene, after 5 minutes of pressure no active bleeding  noted.  At this point, no active bleeding  was noted.  Packs removed.  Count was correct.  Closure of the fascia  with double loop 0 Vicryl running suture and skin staples for the skin.  Instrument and sponge counts were correct.  The patient tolerated the  procedure well and returned to the postanesthesia recovery room in  excellent condition.  Urine was clear at the end of the procedure.      Ginger Carne, MD  Electronically Signed     SHB/MEDQ  D:  01/19/2008  T:  01/20/2008  Job:  [id number]

## 2010-11-14 NOTE — Discharge Summary (Signed)
[name], [name]           ACCOUNT NO.:  [id number]   MEDICAL RECORD NO.:  [id number]          PATIENT TYPE:  OUT   LOCATION:  DAY                          FACILITY:  [facility]   PHYSICIAN:  Martina Sinner, MD DATE OF BIRTH:  07-25-1960   DATE OF ADMISSION:  06/07/2007  DATE OF DISCHARGE:  06/08/2007                               DISCHARGE SUMMARY   DISCHARGE DIAGNOSES:  1. Nephrolithiasis.  2. Staghorn calculi.  3. Cholelithiasis.  4. Ovarian cyst.  5. Bipolar disease.  6. Tobacco abuse.  7. Urinary tract infection.   HOSPITAL COURSE:  This is a 50 year old woman with a history of kidney  stone earlier this year, who comes in complaining of fever and chills  and flank pain on the right.  She also was complaining of right upper  quadrant pain with positive Murphy's sign and suprapelvic pain.  An  outside CT showed gallstones, a 6.1 cm stone at the UP junction on the  right, and a 4.3 cm stone above that, hidden in an ovarian mass.   1. Nephrolithiasis/UTI:  The patient had an infection along with a      stone as indication for admission.  Urology was consulted.  They      put in a stent on June 07, 2007.  She will follow up with Dr.      McDiarmid next week.  We stopped her lithium and her      hydrochlorothiazide, which can contribute to stone formation.  The      pathology report on the stone is pending at this time.  Urine      culture is also pending.  We will continue patient on ciprofloxacin      and follow the culture.  The patient also received Rocephin while      in the hospital.  2. Cholelithiasis.  The patient was found to have gallstones on the      CT, and this was confirmed by HIDA scan.  The patient did not have      a cholecystitis picture.  Her liver enzymes were normal.  We will      plan to have her follow up with surgery for outpatient management      of this issue, followed by inpatient management likely.  3. Ovarian cyst.  Pelvic ultrasound was  done during admission as the      patient was found to have 2 cysts on her ovary.  We will have her      follow up with repeat ultrasound in 1-2 menstrual cycles.  4. Bipolar disease.  The patient has been stable on Seroquel and      lithium, however lithium can cause increase in stones, and she has      had acute renal failure recently with an elevated lithium level.      At this point in time, I do not feel that lithium is an ideal      medication for her.  We will stop it, and continue the Seroquel and      have her follow up closely with her primary doctor,  Dr. Gavin Potters, to      assess how she is doing on this medication.  Of note, in the      hospital we checked her triglycerides, which were high, and her HDL      was low.  Lithium can increase triglycerides.  Will follow for      outpatient management.  Likely, she needs diet and exercise.  5. Tobacco abuse:  The patient was advised to quit.  Smoking cessation      consult was obtained and the patient was given a nicotine patch.  6. Hypertension:  Again, patient's hydrochlorothiazide was stopped.      She was continued on her lisinopril and her metoprolol.   CONSULTATIONS:  Urology.   PROCEDURES:  Insertion of stent into her right ureter.   LABORATORY DATA:  UA on admission, this was a cath UA, showed specific  gravity of 1.013, trace blood, positive nitrite, and large leukocyte  esterase.  Micro on the same showed white blood cells 21-50, a few  squamous cells, 0-2 RBCs and many bacteria.  CBC had a white count of  13.8, H and H 12.9/38.7, platelets 553.  ANC 8.9.  Lithium level was low  at 0.43.  LFTs were normal, except alkaline phosphatase was slightly  elevated at 136.  The patient also had a potassium of 3.1 on June 07, 2007, when it was 3.1.  Then later the same day on June 07, 2007 it  was 3.7 after repletion.  Urine culture is still pending at the time of  discharge.  Cholesterol is 158, triglycerides 292.  HDL  less than 10,  VLDL 58, LDL unable to calculate.   DISCHARGE MEDICATIONS:  1. Seroquel 300 mg p.o. nightly.  2. Stop lithium.  3. Stop hydrochlorothiazide.  4. Lisinopril 20 mg daily.  5. Metoprolol 25 mg b.i.d.  6. Ciprofloxacin 500 mg twice a day for 12 days, to finish a 14-day      course.  7. Percocet 5/325 mg 1-2 p.o. q.6 h. p.r.n. pain.      Rolm Gala, M.D.  Electronically Signed      Martina Sinner, MD  Electronically Signed    HG/MEDQ  D:  06/08/2007  T:  06/08/2007  Job:  [id number]

## 2010-11-14 NOTE — Group Therapy Note (Signed)
NAMEAIDYN, [name] NO.:  [id number]   MEDICAL RECORD NO.:  [id number]          PATIENT TYPE:  WOC   LOCATION:  [facility]                   FACILITY:  [facility]   PHYSICIAN:  Ginger Carne, MD DATE OF BIRTH:  09-Apr-1961   DATE OF SERVICE:  11/21/2007                                  CLINIC NOTE   The patient is here for followup after her visit on Nov 07, 2007.  At  that time, she was followed for a left ovarian mass, which on September 19, 2007, ultrasound demonstrated a 6.8-cm lesion that was complex and  septated.  Her CA-125 was 19.5.  The patient denies pelvic pain.  At  this point, I have ordered another ultrasound to confirm presence of  said mass.  If indeed it is still showing up on the ultrasound, she will  be scheduled for a laparotomy with a left salpingo-oophorectomy and  possible TAH-BSO, omentectomy, and peritoneal washings.  The reader is  referred to the previous note of Nov 07, 2007, for a complete discussion  and workup.   The patient's past history is extensive, both medically and surgically.  The patient was apprised as to the nature of the said procedure  including risks of wound infection; dehiscence; injury to ureter, bowel,  and bladder; hemorrhage possibly requiring blood transfusion; and  consequences related to the possibility of discovering an ovarian  carcinoma.  A frozen section will be obtained in the operating room.           ______________________________  Ginger Carne, MD     SHB/MEDQ  D:  11/21/2007  T:  11/22/2007  Job:  [id number]

## 2010-11-17 NOTE — Discharge Summary (Signed)
[name], [name]           ACCOUNT NO.:  [id number]   MEDICAL RECORD NO.:  [id number]          PATIENT TYPE:  INP   LOCATION:  5528                         FACILITY:  [facility]   PHYSICIAN:  Ruthe Mannan, M.D.       DATE OF BIRTH:  September 12, 1960   DATE OF ADMISSION:  08/02/2006  DATE OF DISCHARGE:  08/05/2006                               DISCHARGE SUMMARY   HISTORY OF PRESENT ILLNESS IN BRIEF:  [name] is a 50 year old  female with a past medical history significant for bipolar disorder,  depression, hypertension, congestive heart failure questionable, who  presented who presented in the ED with a 7-day history of nausea and  vomiting.  Aggressive fluid hydration was started and it was found that  the patient had lithium toxicity.   DISCHARGE DIAGNOSES:  1. Lithium intoxication.  2. Congestive heart failure.  3. Acute renal failure.  4. Leukocytosis.  5. Bipolar disorder.   DISCHARGE MEDICATIONS:  1. Lithium 300 mg t.i.d.  2. Seroquel 600 mg q. h.s.   DISCHARGE LABS:  1. Lithium level 0.90.  2. Creatinine 0.097.  3. White count 8.9.  4. Hemoglobin 11.7.  5. Hematocrit 34.3.  6. Urine culture no growth to date.  7. Renal ultrasound showed no hydronephrosis.  8. FENA was 0.32% consistent with prerenal acute renal failure.   HOSPITAL COURSE:  1. Lithium toxicity, resolved.  Patient presented to the emergency      department with a 7-day history of nausea, vomiting, diarrhea.  She      also had an inappropriate affect on admission.  She was laughing at      painful stimuli and not following commands appropriately.  A      lithium level was then drawn, since patient has a history of      bipolar disorder and had been taking lithium, and was found that      her lithium level was 2.55.  After consulting poison control we      decided that with her moderate level toxicity we would try to      aggressively hydrate her with fluids.  We would then also measure      her  lithium levels q.2 hours and if her lithium level increased      rather than decreased we would need to consult renal for dialysis.      Fortunately, her lithium level every 2 hours gradually decreased      until her level of discharge which is within normal treatment      range.  The day before discharge we restarted her lithium level      without any problem.  Patient also had a normal neuro exam on her      day of discharge along with a more appropriate affect.  2. Acute renal failure, likely secondary to dehydration and lithium      intoxication.  Her creatinine on admission was 3.5, and she had no      history of renal failure.  After aggressive hydration her      creatinine at discharge came back down to a  normal level of 0.97.      FENA was 0.32%, which was consistent with a prerenal failure as      expected.  3. Congestive heart failure.  It is unsure where the diagnosis of      congestive heart failure came in, but we held her lisinopril and      hydrochlorothiazide on admission due to her acute renal failure.      We did not restart those medications as lisinopril can interact      with lithium.  We therefore are having her have a close followup in      the family practice clinic this week and would recommend restarting      either a calcium channel blocker or a beta-blocker for her      congestive heart failure.  4. Leukocytosis.  On admission patient had no obvious source of      infection.  However, her white blood cell count was 20.5.  We did      check blood cultures, urine cultures and a chest x-ray just to rule      out any source of infection.  They all came back negative.  Her      leukocytosis was likely secondary to lithium toxicity.  It has      since resolved as her white blood count on discharge is with normal      limits at 8.9.  5. Bipolar disorder.  We restarted her home meds of Seroquel and      lithium the day prior to her discharge.  The patient seems to  be      adjusting well.  She is to follow up with her normal appointments      at the mood disorders clinic at the family practice center.   FOLLOWUP ISSUES:  Patient is to follow up with Dr. Gavin Potters at the [facility] at [phone] on February 6 at 9:45 a.m.  We held all of  her blood pressure medications, so we would recommend restarting either  calcium channel blocker or beta-blocker instead of her lisinopril as it  can interact with lithium to cause further toxicity.           ______________________________  Ruthe Mannan, M.D.     TA/MEDQ  D:  08/05/2006  T:  08/06/2006  Job:  [id number]   cc:   Rolm Gala, M.D.  Dr. Joaquim Lai

## 2010-12-20 ENCOUNTER — Encounter: Payer: Self-pay | Admitting: Family Medicine

## 2010-12-20 ENCOUNTER — Ambulatory Visit (INDEPENDENT_AMBULATORY_CARE_PROVIDER_SITE_OTHER): Payer: 59 | Admitting: Family Medicine

## 2010-12-20 ENCOUNTER — Telehealth: Payer: Self-pay | Admitting: Family Medicine

## 2010-12-20 DIAGNOSIS — I10 Essential (primary) hypertension: Secondary | ICD-10-CM

## 2010-12-20 DIAGNOSIS — E119 Type 2 diabetes mellitus without complications: Secondary | ICD-10-CM

## 2010-12-20 DIAGNOSIS — H538 Other visual disturbances: Secondary | ICD-10-CM

## 2010-12-20 LAB — BASIC METABOLIC PANEL
CO2: 25 mEq/L (ref 19–32)
Calcium: 9.8 mg/dL (ref 8.4–10.5)
Chloride: 105 mEq/L (ref 96–112)
Creat: 0.96 mg/dL (ref 0.50–1.10)
Glucose, Bld: 85 mg/dL (ref 70–99)
Sodium: 141 mEq/L (ref 135–145)

## 2010-12-20 NOTE — Progress Notes (Signed)
  Subjective:    Patient ID: [name], female    DOB: 24-Oct-1960, 50 y.o.   MRN: [id number]  HPI 1. DMII:  Pt is taking her Metformin and Novolog as prescribed.  She is checking her blood sugars regularly.  CBGs range between 90-160.  She is trying to watch her diet but still eating a lot of carbs  2. HTN:  She is taking her medications as prescribed.  Not taking Norvasc.  She doesn't check her blood pressure at home regularly  3. Blurred vision:  Improved with glasses.  Has not been to Opthalmology yet.  She can't go because she can't afford it without insurance   Review of Systems Denies chest pain, shortness of breath, light-headedness.  Denies vision changes, headaches.    Objective:   Physical Exam  Constitutional: No distress.       Obese  Eyes: Conjunctivae and EOM are normal. Pupils are equal, round, and reactive to light.       Normal fundoscopic exam  Neck: Normal range of motion. Neck supple.  Cardiovascular: Normal rate and regular rhythm.   Pulmonary/Chest: Effort normal and breath sounds normal. No respiratory distress. She has no wheezes.  Abdominal: Soft. Bowel sounds are normal. She exhibits no distension and no mass. There is no tenderness. There is no rebound and no guarding.  Musculoskeletal: Normal range of motion. She exhibits no edema.  Skin: Skin is warm and dry. No rash noted. No erythema.  Psychiatric: She has a normal mood and affect.          Assessment & Plan:

## 2010-12-20 NOTE — Telephone Encounter (Signed)
Discussed with patient her A1C results.  Told her that I was pleased with the number.

## 2010-12-20 NOTE — Patient Instructions (Signed)
I will let you know the lab results We will not make any changes today

## 2010-12-20 NOTE — Assessment & Plan Note (Signed)
Improved with the use of reading glasses.  Advised her to still follow up with Opthalmology for eye exam. 

## 2010-12-26 ENCOUNTER — Encounter: Payer: Self-pay | Admitting: Family Medicine

## 2011-03-06 ENCOUNTER — Other Ambulatory Visit: Payer: Self-pay | Admitting: Family Medicine

## 2011-03-06 DIAGNOSIS — I10 Essential (primary) hypertension: Secondary | ICD-10-CM

## 2011-03-06 MED ORDER — METOPROLOL TARTRATE 25 MG PO TABS: 12.5000 mg | ORAL_TABLET | Freq: Two times a day (BID) | ORAL | Status: DC

## 2011-03-19 ENCOUNTER — Encounter: Payer: Self-pay | Admitting: Family Medicine

## 2011-03-19 ENCOUNTER — Ambulatory Visit (INDEPENDENT_AMBULATORY_CARE_PROVIDER_SITE_OTHER): Payer: 59 | Admitting: Family Medicine

## 2011-03-19 VITALS — BP 130/76 | HR 91 | Temp 97.5°F | Wt 251.0 lb

## 2011-03-19 DIAGNOSIS — F339 Major depressive disorder, recurrent, unspecified: Secondary | ICD-10-CM

## 2011-03-19 DIAGNOSIS — I10 Essential (primary) hypertension: Secondary | ICD-10-CM

## 2011-03-19 DIAGNOSIS — F319 Bipolar disorder, unspecified: Secondary | ICD-10-CM

## 2011-03-19 DIAGNOSIS — E785 Hyperlipidemia, unspecified: Secondary | ICD-10-CM

## 2011-03-19 DIAGNOSIS — I872 Venous insufficiency (chronic) (peripheral): Secondary | ICD-10-CM

## 2011-03-19 DIAGNOSIS — M545 Low back pain: Secondary | ICD-10-CM

## 2011-03-19 DIAGNOSIS — M25561 Pain in right knee: Secondary | ICD-10-CM

## 2011-03-19 DIAGNOSIS — E119 Type 2 diabetes mellitus without complications: Secondary | ICD-10-CM

## 2011-03-19 DIAGNOSIS — F17200 Nicotine dependence, unspecified, uncomplicated: Secondary | ICD-10-CM

## 2011-03-19 DIAGNOSIS — H538 Other visual disturbances: Secondary | ICD-10-CM

## 2011-03-19 DIAGNOSIS — M25569 Pain in unspecified knee: Secondary | ICD-10-CM

## 2011-03-19 NOTE — Assessment & Plan Note (Signed)
Praised for quitting. Will continue to follow.

## 2011-03-19 NOTE — Assessment & Plan Note (Signed)
Mood stable. Has self weaned from Lithium and seroquel. Warned of symptoms to look for concerning mania or depression. Told patient to please call us before she makes medication changes in the future.

## 2011-03-19 NOTE — Progress Notes (Signed)
  Subjective:    Patient ID: [name], female    DOB: 05-23-1961, 50 y.o.   MRN: [id number]  HPI Patient is a 50 F with multiple medical problems including DM II, HTN, HLD, and Bipolar NOS presenting for diabetes follow up and R knee pain.   1. DM. HGB A1c 5.7 today. Patient has had hypoglycemia symptoms at a rate of about 1 per month in the AM. She is on metformin and Novolog 70/30 12 units BID. She has not seen an optho due to insurance concerns. Denies polyuria, polydipsia, vision changes, blurry vision, Neuropathy Paresthesias. Not able to exercise due to knee pain.   2. Hypertension-no chest pain, shortness of breath. Occasional peripheral edema if abrupt increase in salt intake. Compliant on medications.   3. R knee pain-Patient with persistent knee pain 1 year out from twisting her knee x3. Her pain is worse with walking sometimes as bad as 10/10. She sometimes cannot sleep due to the pain if she had been on her feet for several hours. Her right knee occasionally "pops" and also buckles on her on an intermittent basis.   She was evaluated 04/2011 for knee pain and was found to have some minimal joint space narrowing but no other acute issues on knee x-ray. She was given trials of NSAIDs-800 ibuprofen 3-4x a day for 2-3 weeks with minimal relief. Patient has been taking tramadol occasionally since that time with moderate improvement.   4. Bipolar-has weaned herself off of both lithium and Seroquel. PHQ9 score of 4. No manic symptoms.  Review of Systems negative except as noted in HPI      Objective:   Physical Exam  Constitutional: She is oriented to person, place, and time. She appears well-developed.  HENT:  Head: Normocephalic and atraumatic.  Right Ear: External ear normal.  Left Ear: External ear normal.  Eyes: Pupils are equal, round, and reactive to light.  Neck: Normal range of motion. Neck supple.  Cardiovascular: Normal rate, regular rhythm, normal heart sounds and  intact distal pulses.  Exam reveals no gallop and no friction rub.   No murmur heard.      Distant heart sounds  Pulmonary/Chest: Effort normal and breath sounds normal. No respiratory distress. She has no wheezes. She has no rales. She exhibits no tenderness.  Abdominal: Soft. Bowel sounds are normal.  Musculoskeletal:       Right knee: She exhibits decreased range of motion, swelling and effusion. She exhibits no laceration, no erythema, normal alignment, no LCL laxity, no bony tenderness and no MCL laxity. No medial joint line, no lateral joint line, no MCL, no LCL and no patellar tendon tenderness noted.       Legs: Neurological: She is alert and oriented to person, place, and time. She has normal reflexes.       4/5 muscle strength-knee extension and flexion, dorsiflexion of foot. 2+ patellar reflexes. 1+ achilles tendon reflex.   Skin: Skin is warm and dry.  Psychiatric: She has a normal mood and affect. Her behavior is normal.      Assessment & Plan:

## 2011-03-19 NOTE — Assessment & Plan Note (Signed)
PHQ9 score of 4 on no medications. Will continue to follow.

## 2011-03-19 NOTE — Patient Instructions (Addendum)
It was a pleasure to meet you today!   For your knee pain, we would like to see you back in 2 weeks to see how the injection helped you.  1. Do the following exercises 3x per day for 15 repetitions each. IF an exercise says hold for ____ seconds,  Hold stretches for 15 seconds.  2. Keep your brace on for walking and off while sleeping.  For your diabetes, we would like for you to decrease your nighttime insulin to 10 units. We would also like for you to see an ophthalmologist for your diabetic eye exam.          Meniscus Tear with Phase I Rehab   The meniscus is a C-shaped cartilage structure, located in the knee joint between the thigh bone (femur) and the  shinbone (tibia). Two menisci are located in each knee joint: the inner and outer meniscus. The meniscus acts as an adapter between the thigh bone and shinbone, allowing them to fit properly together. It also functions as a shock absorber, to reduce the stress placed on the knee joint and to help supply nutrients to the knee joint cartilage. As people age, the meniscus begins to harden and become more vulnerable to injury. Meniscus tears are a common injury, especially in older athletes. Inner meniscus tears are more common than outer meniscus tears.     SYMPTOMS  Pain in the knee, especially with standing or squatting with the affected leg.  Tenderness along the joint line.  Swelling in the knee joint (effusion), usually starting 1 to 2 days after injury.  Locking or catching of the knee joint, causing inability to straighten the knee completely.  Giving way or buckling of the knee.   CAUSES A meniscus tear occurs when a force is placed on the meniscus that is greater than it can handle. Common causes of injury include:  Direct hit (trauma) to the knee.  Twisting, pivoting, or cutting (rapidly changing direction while running), kneeling or squatting.  Without injury, due to aging.   RISK INCREASES WITH    Contact sports  (football, rugby).  Sports in which cleats are used with pivoting (soccer, lacrosse) or sports in which good shoe grip and sudden change in direction are required (racquetball, basketball, squash).  Previous knee injury.  Associated knee injury, particularly ligament injuries.  Poor strength and flexibility.   PREVENTIVE MEASURES.    Warm up and stretch properly before activity.  Maintain physical fitness: l Strength, flexibility, and endurance. l Cardiovascular fitness.  Protect the knee with a brace or elastic bandage.  Wear properly fitted protective equipment (proper cleats for the surface).   PROGNOSIS Sometimes, meniscus tears heal on their own. However, definitive treatment requires surgery, followed by at least 6 weeks of recovery.     POSSIBLE COMPLICATIONS    Recurring symptoms that result in a chronic problem.  Repeated knee injury, especially if sports are resumed too soon after injury or surgery.  Progression of the tear (the tear gets larger), if untreated.  Arthritis of the knee in later years (with or without surgery).  Complications of surgery, including infection, bleeding, injury to nerves (numbness, weakness, paralysis) continued pain, giving way, locking, nonhealing of meniscus (if repaired), need for further surgery, and knee stiffness (loss of motion).   GENERAL TREATMENT CONSIDERATIONS   Treatment first involves the use of ice and medicine, to reduce pain and inflammation. You may find using crutches to walk more comfortable. However, it is okay to bear weight  on the injured knee, if the pain will allow it. Surgery is often advised as a definitive treatment. Surgery is performed through an incision near the joint (arthroscopically). The torn piece of the meniscus is removed, and if possible the joint cartilage is repaired. After surgery, the joint must be restrained. After restraint, it is important to perform strengthening and stretching exercises to  help regain strength and a full range of motion. These exercises may be completed at home or with a therapist.     MEDICATION:    If pain medicine is needed, nonsteroidal anti-inflammatory medicines (aspirin and ibuprofen), or other minor pain relievers (acetaminophen), are often advised.    Do not take pain medicine for 7 days before surgery.    Prescription pain relievers may be given, if your caregiver thinks they are needed. Use only as directed and only as much as you need.   HEAT AND COLD:    Cold treatment (icing) should be applied for 10 to 15 minutes every 2 to 3 hours for inflammation and pain, and immediately after activity that aggravates your symptoms. Use ice packs or an ice massage.  Heat treatment may be used before performing stretching and strengthening activities prescribed by your caregiver, physical therapist, or athletic trainer. Use a heat pack or a warm water soak.     SEEK TREATMENT IF:  Symptoms get worse or do not improve in 2 weeks, despite treatment.  New, unexplained symptoms develop. (Drugs used in treatment may produce side effects.)     PHASE I EXERCISES   RANGE OF MOTION AND STRETCHING EXERCISES - Meniscus Tear, Non-operative Phase I These are some of the initial exercises with which you may start your rehabilitation program, until you see your caregiver again or until your symptoms are resolved. Remember:    These initial exercises are intended to be gentle. They will help you restore motion without increasing any swelling.  Completing these exercises allows less painful movement and prepares you for the more aggressive strengthening exercises in Phase II.  An effective stretch should be held for at least 30 seconds.  A stretch should never be painful. You should only feel a gentle lengthening or release in the stretched tissue.       RANGE OF MOTION - Knee Flexion, Active  Lie on your back with both knees straight. (If this causes back  discomfort, bend your healthy knee, placing your foot flat on the floor.)  Slowly slide your heel back toward your buttocks until you feel a gentle stretch in the front of your knee or thigh.    Hold for __________ seconds. Slowly slide your heel back to the starting position. Repeat __________ times. Complete this exercise __________ times per day.       RANGE OF MOTION - Knee Flexion and Extension, Active-Assisted  Sit on the edge of a table or chair with your thighs firmly supported. It may be helpful to place a folded towel under the end of your __________ thigh.    Flexion (bending): Place the ankle of your healthy leg on top of the other ankle. Use your healthy leg to gently bend your __________ knee until you feel a mild tension across the top of your knee.    Hold for __________ seconds.    Extension (straightening): Switch your ankles so your __________ leg is on top. Use your healthy leg to straighten your __________ knee until you feel a mild tension on the backside of your knee.  Hold for  __________ seconds. Repeat __________ times. Complete __________ times per day.     STRETCH - Knee Flexion, Supine  Lie on the floor with your __________ heel and foot lightly touching the wall. (Place both feet on the wall if you do not use a door frame.)  Without using any effort, allow gravity to slide your foot down the wall slowly until you feel a gentle stretch in the front of your __________ knee.    Hold this stretch for __________ seconds. Then return the leg to the starting position, using your healthy leg for help, if needed.   Repeat __________ times. Complete this stretch __________ times per day.      STRETCH - Knee Extension Sitting  Sit with your __________ leg/heel propped on another chair, coffee table, or foot stool.  Allow your leg muscles to relax, letting gravity straighten out your knee.*  You should feel a stretch behind your __________ knee. Hold this  position for __________ seconds.   Repeat __________ times. Complete this stretch __________ times per day.     *Your physician, physical therapist or athletic trainer may instruct you place a __________ pound weight on your thigh, just above your kneecap, to deepen the stretch.       STRENGTHENING EXERCISES  - Meniscus Tear, Non-operative Phase I These exercises may help you when beginning to rehabilitate your injury. They may resolve your symptoms with or without further involvement from your physician, physical therapist or athletic trainer. While completing these exercises, remember:    Muscles can gain both the endurance and the strength needed for everyday activities through controlled exercises.  Complete these exercises as instructed by your physician, physical therapist or athletic trainer. Progress the resistance and repetitions only as guided.    STRENGTH - Quadriceps, Isometrics  Lie on your back with your __________ leg extended and your opposite knee bent.  Gradually tense the muscles in the front of your __________ thigh. You should see either your knee cap slide up toward your hip or increased dimpling just above the knee. This motion will push the back of the knee down toward the floor, mat, or bed on which you are lying.    Hold the muscle as tight as you can, without increasing your pain, for __________ seconds.  Relax the muscles slowly and completely between each repetition. Repeat __________ times. Complete this exercise __________ times per day.       STRENGTH - Quadriceps, Short Arcs   Lie on your back.  Place a __________ inch towel roll under your __________ knee, so that the knee bends slightly.  Raise only your lower leg by tightening the muscles in the front of your thigh. Do not allow your thigh to rise.  Hold this position for __________ seconds. Repeat __________ times. Complete this exercise __________ times per day.       OPTIONAL ANKLE WEIGHTS:   Begin with __________ pounds, but DO NOT exceed ____________________ pounds.  Increase in one pound increments.      STRENGTH - Quadriceps, Straight Leg Raises    Quality counts! Watch for signs that the quadriceps muscle is working, to be sure you are strengthening the correct muscles and not "cheating" by substituting with healthier muscles.  Lay on your back with your __________ leg extended and your opposite knee bent.    Tense the muscles in the front of your __________ thigh. You should see either your knee cap slide up or increased dimpling just above the knee. Your  thigh may even shake a bit.  Tighten these muscles even more and raise your leg 4 to 6 inches off the floor. Hold for __________ seconds.  Keeping these muscles tense, lower your leg.  Relax the muscles slowly and completely in between each repetition. Repeat __________ times. Complete this exercise __________ times per day.       STRENGTH - Hamstring, Curls   Lay on your stomach with your legs extended. (If you lay on a bed, your feet may hang over the edge.)  Tighten the muscles in the back of your thigh to bend your __________ knee up to 90 degrees. Keep your hips flat on the bed.  Hold this position for __________ seconds.  Slowly lower your leg back to the starting position. Repeat __________ times. Complete this exercise __________ times per day.      STRENGTH - Quadriceps, Squats  Stand in a door frame so that your feet and knees are in line with the frame.  Use your hands for balance, not support, on the frame.  Slowly lower your weight, bending at the hips and knees. Keep your lower legs upright so that they are parallel with the door frame. Squat only within the range that does not increase your knee pain. Never let your hips drop below your knees.    Slowly return upright, pushing with your legs, not pulling with your hands. Repeat __________ times. Complete this exercise __________ times per day.        STRENGTH - Quad/VMO, Isometric   Sit in a chair with your right knee slightly bent. With your fingertips, feel the VMO muscle just above the inside of your knee. The VMO is important in controlling the position of your kneecap.    Keeping your fingertips on this muscle. Without actually moving your leg, attempt to drive your knee down as if straightening your leg. You should feel your VMO tense. If you have a difficult time, you may wish to try the same exercise on your healthy knee first.  Tense this muscle as hard as you can without increasing any knee pain.    Hold for 15 seconds. Relax the muscles slowly and completely in between each repetition. Repeat 15 times. Complete exercise 3 times per day.     Document Released: 07/02/1998  Document Re-Released: 07/10/2009 [facility] Patient Information 2011 [place], [place].

## 2011-03-19 NOTE — Assessment & Plan Note (Signed)
Patient does not complain of any blurry vision at this time. Still needs an optho exam for diabets.

## 2011-03-19 NOTE — Assessment & Plan Note (Signed)
Drastically improved with hgb a1c 5.7 today down from 6.0 previously and as high as >12 in March, 2012. Patient with some hypoglycemia AM symptoms (1x per month). Will decrease nighttime novolog 70/30 to 10 units from 12 units BID (keep AM dose the same). No diabetic neuropathy noted on exam.

## 2011-03-19 NOTE — Assessment & Plan Note (Signed)
Well controlled on current meds -lopressor and lisinopril. BMET checked on 12/20/10 will recheck in 1 year.

## 2011-03-19 NOTE — Assessment & Plan Note (Signed)
Patient with chronic knee pain with buckling of knee most likely consistent with meniscus injurty. Patient has been treated conservateively with NSAIDS with minimal relief. Tramadol has given mild relief. Will give corticosteroid injection today. Patient to follow up in 2 weeks to monitor for relief of symptoms. Given compression sleeve to wear during waking hours while walking. Also given exercises/stretches to complete 3x a day for 2-3 weeks. Advised to minimize activity on knee for next 2 weeks.   Procedure Note (Injection):  Consent obtained and verified. Sterile betadine prep. Furthur cleansed with alcohol. Topical analgesic spray: Ethyl chloride. Joint: R knee Completed without difficulty Meds: 1 cc kenalog 40. 4 cc Lidocaine Aftercare instructions and Red flags advised. Complete resolution of symptoms noted within 2-3 minutes after injection.

## 2011-03-19 NOTE — Assessment & Plan Note (Signed)
Continue statin. No complaints.

## 2011-03-22 LAB — BASIC METABOLIC PANEL
CO2: 25
Calcium: 8.8
Chloride: 106
GFR calc Af Amer: >60
Potassium: 3.6
Sodium: 138

## 2011-03-22 LAB — CBC
HCT: 39.2
Hemoglobin: 13.3
MCHC: 33.9
RBC: 4.46

## 2011-03-22 LAB — TYPE AND SCREEN: ABO/RH(D): O POS

## 2011-03-23 LAB — URINALYSIS, ROUTINE W REFLEX MICROSCOPIC
Bilirubin Urine: NEGATIVE
Glucose, UA: NEGATIVE
Ketones, ur: NEGATIVE
pH: 8

## 2011-03-23 LAB — URINE MICROSCOPIC-ADD ON

## 2011-03-23 LAB — URINE CULTURE

## 2011-03-23 LAB — CBC
MCHC: 32.5
MCV: 87.5
Platelets: 582 — ABNORMAL HIGH
WBC: 11.9 — ABNORMAL HIGH

## 2011-03-23 LAB — POCT I-STAT CREATININE
Creatinine, Ser: 1
Operator id: 151321

## 2011-03-23 LAB — DIFFERENTIAL
Basophils Relative: 1
Eosinophils Absolute: 0.4
Neutrophils Relative %: 75

## 2011-03-23 LAB — I-STAT 8, (EC8 V) (CONVERTED LAB)
Acid-Base Excess: 1
TCO2: 29
pCO2, Ven: 47.4
pH, Ven: 7.368 — ABNORMAL HIGH

## 2011-03-30 LAB — BASIC METABOLIC PANEL
BUN: 5 — ABNORMAL LOW
CO2: 27
Calcium: 9.5
Calcium: 8.8
Chloride: 102
Creatinine, Ser: 0.87
GFR calc Af Amer: >60
GFR calc non Af Amer: >60
Glucose, Bld: 134 — ABNORMAL HIGH
Glucose, Bld: 113 — ABNORMAL HIGH
Sodium: 137

## 2011-03-30 LAB — POCT I-STAT, CHEM 8
BUN: 12
Calcium, Ion: 1.19
Chloride: 106
Creatinine, Ser: 1
Sodium: 139
TCO2: 28

## 2011-03-30 LAB — COMPREHENSIVE METABOLIC PANEL
Albumin: 3.2 — ABNORMAL LOW
BUN: 7
Chloride: 112
Creatinine, Ser: 0.88
GFR calc non Af Amer: >60
Glucose, Bld: 107 — ABNORMAL HIGH
Total Bilirubin: 0.2 — ABNORMAL LOW

## 2011-03-30 LAB — CBC
HCT: 44.8
HCT: 34.2 — ABNORMAL LOW
Hemoglobin: 14.8
Hemoglobin: 11 — ABNORMAL LOW
Hemoglobin: 10.8 — ABNORMAL LOW
MCHC: 33.1
MCV: 89.8
MCV: 87.6
Platelets: 372
Platelets: 374
RBC: 3.73 — ABNORMAL LOW
RDW: 17.2 — ABNORMAL HIGH
RDW: 16.8 — ABNORMAL HIGH
WBC: 17.9 — ABNORMAL HIGH
WBC: 8.8
WBC: 7.3

## 2011-03-30 LAB — HEMOGLOBIN AND HEMATOCRIT, BLOOD
HCT: 41.8
Hemoglobin: 13.6

## 2011-03-30 LAB — DIFFERENTIAL
Basophils Absolute: 0.1
Eosinophils Relative: 2
Lymphocytes Relative: 29
Lymphs Abs: 2.1
Monocytes Absolute: 0.6
Neutro Abs: 4.3

## 2011-03-30 LAB — CULTURE, ROUTINE-ABSCESS

## 2011-03-30 LAB — ANAEROBIC CULTURE: Gram Stain: NONE SEEN

## 2011-03-30 LAB — HCG, SERUM, QUALITATIVE: Preg, Serum: NEGATIVE

## 2011-04-05 ENCOUNTER — Ambulatory Visit: Payer: 59 | Admitting: Family Medicine

## 2011-04-06 LAB — COMPREHENSIVE METABOLIC PANEL
ALT: 22
ALT: 35
AST: 24
BUN: 19
CO2: 22
CO2: 25
Calcium: 8.6
Calcium: 9.1
Creatinine, Ser: 1.23 — ABNORMAL HIGH
GFR calc Af Amer: 57 — ABNORMAL LOW
GFR calc non Af Amer: 31 — ABNORMAL LOW
GFR calc non Af Amer: 47 — ABNORMAL LOW
Glucose, Bld: 99
Sodium: 138
Total Protein: 5.7 — ABNORMAL LOW
Total Protein: 7

## 2011-04-06 LAB — CBC
HCT: 31.6 — ABNORMAL LOW
HCT: 32.5 — ABNORMAL LOW
Hemoglobin: 10.6 — ABNORMAL LOW
Hemoglobin: 11 — ABNORMAL LOW
MCHC: 33.2
MCHC: 33.3
MCHC: 33.4
MCV: 90.7
MCV: 90.7
MCV: 91
Platelets: 406 — ABNORMAL HIGH
Platelets: 501 — ABNORMAL HIGH
RBC: 3.48 — ABNORMAL LOW
RBC: 3.64 — ABNORMAL LOW
RBC: 4.67
RDW: 15.3
RDW: 15.8 — ABNORMAL HIGH
RDW: 16 — ABNORMAL HIGH
RDW: 16.1 — ABNORMAL HIGH
WBC: 16.1 — ABNORMAL HIGH

## 2011-04-06 LAB — LIPASE, BLOOD: Lipase: 54

## 2011-04-06 LAB — DIFFERENTIAL
Eosinophils Relative: 2
Eosinophils Relative: 3
Lymphocytes Relative: 13
Lymphocytes Relative: 26
Lymphs Abs: 2
Lymphs Abs: 3.5
Monocytes Absolute: 1.2 — ABNORMAL HIGH
Monocytes Relative: 7
Monocytes Relative: 7
Neutro Abs: 12.5 — ABNORMAL HIGH

## 2011-04-06 LAB — URINE MICROSCOPIC-ADD ON

## 2011-04-06 LAB — BASIC METABOLIC PANEL
BUN: 31 — ABNORMAL HIGH
BUN: 5 — ABNORMAL LOW
CO2: 24
CO2: 25
Calcium: 9.1
Chloride: 108
Creatinine, Ser: 0.95
Creatinine, Ser: 2.5 — ABNORMAL HIGH
GFR calc Af Amer: 25 — ABNORMAL LOW
Glucose, Bld: 107 — ABNORMAL HIGH

## 2011-04-06 LAB — URINE CULTURE: Colony Count: 60000

## 2011-04-06 LAB — URINALYSIS, ROUTINE W REFLEX MICROSCOPIC
Glucose, UA: NEGATIVE
Glucose, UA: NEGATIVE
Protein, ur: NEGATIVE
Specific Gravity, Urine: 1.016
Urobilinogen, UA: 0.2
Urobilinogen, UA: 0.2

## 2011-04-06 LAB — CULTURE, BLOOD (ROUTINE X 2)

## 2011-04-06 LAB — POCT PREGNANCY, URINE: Preg Test, Ur: NEGATIVE

## 2011-04-06 LAB — B-NATRIURETIC PEPTIDE (CONVERTED LAB): Pro B Natriuretic peptide (BNP): <30

## 2011-04-09 LAB — STONE ANALYSIS

## 2011-04-09 LAB — COMPREHENSIVE METABOLIC PANEL
AST: 19
Albumin: 2.8 — ABNORMAL LOW
BUN: 10
CO2: 26
Calcium: 9
Chloride: 102
Creatinine, Ser: 1.02
GFR calc Af Amer: >60
GFR calc non Af Amer: 54 — ABNORMAL LOW
Glucose, Bld: 101 — ABNORMAL HIGH
Potassium: 3.1 — ABNORMAL LOW
Total Bilirubin: 0.6
Total Protein: 6.7
Total Protein: 7.4

## 2011-04-09 LAB — BASIC METABOLIC PANEL
BUN: 7
BUN: 8
CO2: 27
Calcium: 8.9
Chloride: 106
Creatinine, Ser: 0.85
GFR calc non Af Amer: 60 — ABNORMAL LOW
Glucose, Bld: 108 — ABNORMAL HIGH
Glucose, Bld: 85
Potassium: 3.7
Potassium: 3.9
Sodium: 137

## 2011-04-09 LAB — URINALYSIS, ROUTINE W REFLEX MICROSCOPIC
Ketones, ur: NEGATIVE
Nitrite: POSITIVE — AB
Protein, ur: NEGATIVE

## 2011-04-09 LAB — LIPID PANEL
Cholesterol: 158
Triglycerides: 292 — ABNORMAL HIGH
VLDL: 58 — ABNORMAL HIGH

## 2011-04-09 LAB — CBC
HCT: 35.1 — ABNORMAL LOW
Hemoglobin: 12
MCHC: 34.2
MCV: 88.1
MCV: 89.9
Platelets: 553 — ABNORMAL HIGH
RBC: 3.99
RDW: 14.8
RDW: 14.8
WBC: 13.8 — ABNORMAL HIGH

## 2011-04-09 LAB — DIFFERENTIAL
Basophils Absolute: 0.2 — ABNORMAL HIGH
Eosinophils Relative: 5
Lymphocytes Relative: 21
Monocytes Absolute: 1.2 — ABNORMAL HIGH
Monocytes Relative: 9

## 2011-04-09 LAB — URINE CULTURE: Colony Count: >=100000

## 2011-10-11 ENCOUNTER — Other Ambulatory Visit: Payer: Self-pay | Admitting: Family Medicine

## 2011-10-11 NOTE — Telephone Encounter (Signed)
Attempted calling patient but # out of service. Consulted with Dr. Earnest Bailey . Will send in one month of metformin with message to call for  appointment for further refills,

## 2011-10-12 ENCOUNTER — Telehealth: Payer: Self-pay | Admitting: *Deleted

## 2011-10-12 DIAGNOSIS — I10 Essential (primary) hypertension: Secondary | ICD-10-CM

## 2011-10-12 MED ORDER — METOPROLOL TARTRATE 25 MG PO TABS: 12.5000 mg | ORAL_TABLET | Freq: Two times a day (BID) | ORAL | Status: DC

## 2011-10-12 MED ORDER — LISINOPRIL 10 MG PO TABS: 10.0000 mg | ORAL_TABLET | Freq: Every day | ORAL | Status: DC

## 2011-10-12 NOTE — Telephone Encounter (Signed)
Received refill request on 3  meds from pharmacy.. Contacted patient and appointment scheduled for 11/08/2011 for BP and diabetes follow up. Rx sent in for one months supply.

## 2011-10-15 ENCOUNTER — Other Ambulatory Visit: Payer: Self-pay | Admitting: Family Medicine

## 2011-10-15 MED ORDER — METFORMIN HCL 500 MG PO TABS: 500.0000 mg | ORAL_TABLET | Freq: Two times a day (BID) | ORAL | Status: DC

## 2011-10-15 NOTE — Telephone Encounter (Signed)
Once again refilled Metformin as received fax that never received by [facility] and they requested it to be resent.

## 2011-10-19 ENCOUNTER — Encounter: Payer: Self-pay | Admitting: *Deleted

## 2011-10-19 ENCOUNTER — Other Ambulatory Visit: Payer: Self-pay | Admitting: Family Medicine

## 2011-10-22 NOTE — Telephone Encounter (Signed)
This encounter was created in error - please disregard.

## 2011-11-08 ENCOUNTER — Ambulatory Visit (INDEPENDENT_AMBULATORY_CARE_PROVIDER_SITE_OTHER): Payer: 59 | Admitting: Family Medicine

## 2011-11-08 ENCOUNTER — Encounter: Payer: Self-pay | Admitting: Family Medicine

## 2011-11-08 VITALS — BP 105/69 | HR 103 | Temp 98.2°F | Ht 64.0 in | Wt 240.0 lb

## 2011-11-08 DIAGNOSIS — F17200 Nicotine dependence, unspecified, uncomplicated: Secondary | ICD-10-CM

## 2011-11-08 DIAGNOSIS — I10 Essential (primary) hypertension: Secondary | ICD-10-CM

## 2011-11-08 DIAGNOSIS — E119 Type 2 diabetes mellitus without complications: Secondary | ICD-10-CM

## 2011-11-08 DIAGNOSIS — E66813 Obesity, class 3: Secondary | ICD-10-CM

## 2011-11-08 DIAGNOSIS — E785 Hyperlipidemia, unspecified: Secondary | ICD-10-CM

## 2011-11-08 DIAGNOSIS — Z23 Encounter for immunization: Secondary | ICD-10-CM

## 2011-11-08 LAB — BASIC METABOLIC PANEL
CO2: 25 mEq/L (ref 19–32)
Calcium: 9.8 mg/dL (ref 8.4–10.5)
Chloride: 102 mEq/L (ref 96–112)
Glucose, Bld: 95 mg/dL (ref 70–99)
Potassium: 3.4 mEq/L — ABNORMAL LOW (ref 3.5–5.3)
Sodium: 140 mEq/L (ref 135–145)

## 2011-11-08 LAB — LDL CHOLESTEROL, DIRECT: Direct LDL: 66 mg/dL

## 2011-11-08 MED ORDER — METOPROLOL TARTRATE 25 MG PO TABS: 12.5000 mg | ORAL_TABLET | Freq: Two times a day (BID) | ORAL | Status: DC

## 2011-11-08 MED ORDER — SIMVASTATIN 20 MG PO TABS: 20.0000 mg | ORAL_TABLET | Freq: Every evening | ORAL | Status: DC

## 2011-11-08 MED ORDER — METFORMIN HCL 500 MG PO TABS: 500.0000 mg | ORAL_TABLET | Freq: Two times a day (BID) | ORAL | Status: DC

## 2011-11-08 MED ORDER — INSULIN NPH (HUMAN) (ISOPHANE) 100 UNIT/ML ~~LOC~~ SUSP: SUBCUTANEOUS | Status: DC

## 2011-11-08 MED ORDER — LISINOPRIL 10 MG PO TABS: 10.0000 mg | ORAL_TABLET | Freq: Every day | ORAL | Status: DC

## 2011-11-08 NOTE — Assessment & Plan Note (Signed)
Encouraged walking 2x a week. Patient not interested in diet change but amenable to walking 1 extra day.

## 2011-11-08 NOTE — Progress Notes (Signed)
Subjective:    Patient ID: [name], female    DOB: 10/01/1960, 51 y.o.   MRN: [id number]  HPI  1. DIABETES Medications taking and tolerating-yes including insulin. Does not need supplies at this time.  Blood Sugars per patient-checking twice per day. fasting- 120-130. Nighttime 130-160.  Hypoglycemia symptoms (shaky, sweaty, hungry,  anxious, tremor, palpitations, confusion, behavior change)-no  Last eye exam-10 years ago, states insurance doesn't cover.  Last foot exam-never Last microalbumin/on ace inhibitor-on ace Daily foot monitoring-not currently  ROS- (-)Polyuria, (-) Polydipsia,  (+ but unchanged)nocturia, (-) Vision changes, (-) feet or hand numbness/pain/tingling,   Diabetic Labs:  Lab Results  Component Value Date   HGBA1C 5.9 11/08/2011   HGBA1C 5.7 03/19/2011   HGBA1C 6.0 12/20/2010   Lab Results  Component Value Date   LDLCALC  Value: 78        Total Cholesterol/HDL:CHD Risk Coronary Heart Disease Risk Table                     Men   Women  1/2 Average Risk   3.4   3.3  Average Risk       5.0   4.4  2 X Average Risk   9.6   7.1  3 X Average Risk  23.4   11.0        Use the calculated Patient Ratio above and the CHD Risk Table to determine the patient's CHD Risk.        ATP III CLASSIFICATION (LDL):  <100     mg/dL   Optimal  478-295  mg/dL   Near or Above                    Optimal  130-159  mg/dL   Borderline  621-308  mg/dL   High  >657     mg/dL   Very High 02/02/6961   CREATININE 0.96 12/20/2010   Last microalbumin: No results found for this basename: MICROALBUR, [id number]   2. Hypertension- BP Readings from Last 3 Encounters:  11/08/11 105/69  03/19/11 130/76  12/20/10 107/73   Home BP monitoring-no Compliant with medications-yes without side effects Denies any CP, HA, SOB, blurry vision, LE edema, transient weakness, orthopnea, PND.   3. TObacco abuse-continues to abstain from smoking.   4. Obesity-uninterested in diabetic education classes for  diet. Walks about 1 mile a week because she has to walking to daughters house. SOmewhat resistant to increasing exercise.   Review of Systems -See HPI  Past Medical History-smoking status noted: former smoker. Reviewed problem list.  Medications- reviewed and updated Chief complaint-noted      Objective:   Physical Exam  Constitutional: She is oriented to person, place, and time. She appears well-developed and well-nourished. No distress.       Obese female sitting on exam table.   HENT:  Head: Normocephalic and atraumatic.  Mouth/Throat: Oropharynx is clear and moist.       Poor dentition with multiple cavities and cracked teeth.   Eyes: Conjunctivae and EOM are normal. Pupils are equal, round, and reactive to light.  Neck: Normal range of motion. Neck supple.  Cardiovascular: Normal rate and regular rhythm.  Exam reveals no gallop and no friction rub.   No murmur heard.      Distant heart sounds.   Pulmonary/Chest: Effort normal and breath sounds normal. She has no wheezes. She has no rales.       Distant sounds. Prolonged expiration.  Abdominal: Soft. Bowel sounds are normal.  Musculoskeletal: Normal range of motion. She exhibits no edema.  Lymphadenopathy:    She has no cervical adenopathy.  Neurological: She is alert and oriented to person, place, and time.  Skin: Skin is warm and dry. She is not diaphoretic.       DM foot exam performed and no neuropathy noted.        Assessment & Plan:  Unsure if had cervix removed as part of hysterectomy.

## 2011-11-08 NOTE — Assessment & Plan Note (Signed)
Patient has now quit successfully for about a year. Praised patient for changes.

## 2011-11-08 NOTE — Assessment & Plan Note (Signed)
Well controlled. WIll check BMET.

## 2011-11-08 NOTE — Assessment & Plan Note (Signed)
a1c still at goal. No more hypoglycemia with reduced nighttime dose of Novolog 70/30 to 10 units at night. Direct LDL today as not fasting. HTN at goal. ENcouraged more walking. Patient not interested in healthier eating. Refilled metformin and insulin.

## 2011-11-08 NOTE — Assessment & Plan Note (Signed)
Recheck LDL today. COntinue statin.

## 2011-11-08 NOTE — Patient Instructions (Signed)
Dear [name],   It was great to see you today. Thank you for coming to clinic. Please read below regarding the issues that we discussed.   1. We refilled your medications.  2. Please come back within a month for a pap smear and to discuss your previous diagnosis of bipolar.  3. We are going to get some labs today which we can talk about at your next visit (cholesterol and kidney function).  4. For your diabetes  1. Your hemoglobin a1c was less than 6 and our goal is less than 7 so that is very good.   2. We are going to refer you to an eye doctor to check if diabetes is affecting your eyes.   3. I want you to look at your feet everyday.   4. I want you to walk 15 minutes twice a week.   5. I would advise you to go to a dentist. Your teeth can be a source of infection and you could get very sick potentially.   Please follow up in clinic in 3 weeks . Please call earlier if you have any questions or concerns.   Sincerely,  Dr. Tana Conch

## 2011-11-09 NOTE — Progress Notes (Signed)
Addended by: Garen Grams F on: 11/09/2011 08:41 AM   Modules accepted: Orders

## 2011-11-12 ENCOUNTER — Telehealth: Payer: Self-pay | Admitting: Family Medicine

## 2011-11-12 ENCOUNTER — Other Ambulatory Visit (INDEPENDENT_AMBULATORY_CARE_PROVIDER_SITE_OTHER): Payer: 59

## 2011-11-12 ENCOUNTER — Encounter: Payer: Self-pay | Admitting: Family Medicine

## 2011-11-12 DIAGNOSIS — N390 Urinary tract infection, site not specified: Secondary | ICD-10-CM | POA: Insufficient documentation

## 2011-11-12 DIAGNOSIS — N182 Chronic kidney disease, stage 2 (mild): Secondary | ICD-10-CM | POA: Insufficient documentation

## 2011-11-12 DIAGNOSIS — E119 Type 2 diabetes mellitus without complications: Secondary | ICD-10-CM

## 2011-11-12 DIAGNOSIS — N179 Acute kidney failure, unspecified: Secondary | ICD-10-CM

## 2011-11-12 LAB — POCT URINALYSIS DIPSTICK
Glucose, UA: NEGATIVE
Nitrite, UA: POSITIVE
Protein, UA: 30
Spec Grav, UA: 1.015
Urobilinogen, UA: 0.2
pH, UA: 6.5

## 2011-11-12 LAB — BASIC METABOLIC PANEL
BUN: 17 mg/dL (ref 6–23)
CO2: 23 mEq/L (ref 19–32)
Calcium: 9.2 mg/dL (ref 8.4–10.5)
Chloride: 108 mEq/L (ref 96–112)
Creat: 0.96 mg/dL (ref 0.50–1.10)
Glucose, Bld: 150 mg/dL — ABNORMAL HIGH (ref 70–99)

## 2011-11-12 LAB — POCT UA - MICROSCOPIC ONLY: RBC, urine, microscopic: >20

## 2011-11-12 MED ORDER — CEPHALEXIN 500 MG PO CAPS: 500.0000 mg | ORAL_CAPSULE | Freq: Three times a day (TID) | ORAL | Status: AC

## 2011-11-12 NOTE — Telephone Encounter (Signed)
Opened document in order to discuss UTI, AKI as documented under problem list.

## 2011-11-12 NOTE — Assessment & Plan Note (Signed)
Will treat UTI. Follow up BMET today. Patient instructed to hold Metformin. Will see on 5/23 and likely repeat BMET and UA.   Patient without complaints of oliguria, polyuria, or dysuria.

## 2011-11-12 NOTE — Assessment & Plan Note (Signed)
UA revealed bacteria, + nitrites, +LE. Likely UTI. Will treat with 7 days of Keflex.

## 2011-11-12 NOTE — Progress Notes (Signed)
BMP AND POCT UA DONE TODAY [name]

## 2011-11-12 NOTE — Telephone Encounter (Signed)
Left voice mail with emergency contact Marcelino Duster as could not reach Dajanee despite 3 separate attempts (all busy signals).    Told daughter that Poppy has an elevated kidney function test/creatinine and should STOP taking Metformin immediately. I have ordered lab tests-BMET, UA which I would like patient to have completed before seeing me next. I would like to see patient within the next week if possible or within 2 weeks.   Tana Conch, MD, PGY1 11/12/2011 10:25 AM

## 2011-11-22 ENCOUNTER — Encounter: Payer: Self-pay | Admitting: Family Medicine

## 2011-11-22 ENCOUNTER — Ambulatory Visit (INDEPENDENT_AMBULATORY_CARE_PROVIDER_SITE_OTHER): Payer: 59 | Admitting: Family Medicine

## 2011-11-22 VITALS — BP 107/73 | HR 87 | Temp 98.2°F | Ht 64.0 in | Wt 248.0 lb

## 2011-11-22 DIAGNOSIS — N390 Urinary tract infection, site not specified: Secondary | ICD-10-CM

## 2011-11-22 DIAGNOSIS — F319 Bipolar disorder, unspecified: Secondary | ICD-10-CM

## 2011-11-22 DIAGNOSIS — N179 Acute kidney failure, unspecified: Secondary | ICD-10-CM

## 2011-11-22 LAB — POCT UA - MICROSCOPIC ONLY

## 2011-11-22 LAB — BASIC METABOLIC PANEL
BUN: 14 mg/dL (ref 6–23)
Chloride: 105 mEq/L (ref 96–112)
Glucose, Bld: 84 mg/dL (ref 70–99)
Potassium: 4.4 mEq/L (ref 3.5–5.3)
Sodium: 138 mEq/L (ref 135–145)

## 2011-11-22 LAB — POCT URINALYSIS DIPSTICK
Bilirubin, UA: NEGATIVE
Ketones, UA: NEGATIVE
Spec Grav, UA: 1.02
pH, UA: 6

## 2011-11-22 NOTE — Patient Instructions (Signed)
Dear [name],   It was great to see you today. Thank you for coming to clinic. Please read below regarding the issues that we discussed.   1. For your worsened kidney function, I want to check a urine and another blood level. I will call you if you need to make any further changes.  2. For your urine infection, I am going to get a urine culture to see if you still have an infection. I will call if we need to treat you with a different medicine.  3. For your history of bipolar, I want to follow up with you within the month to continue to discuss medications for this. I don't want to start them at this time since we need to make sure your kidneys are working correctly first.  4. Please continue to not take the metformin until we make sure your kidneys are working ok.   Please follow up in clinic in 4 weeks . Please call earlier if you have any questions or concerns.   Sincerely,  Dr. Tana Conch

## 2011-11-23 MED ORDER — CIPROFLOXACIN HCL 750 MG PO TABS: 750.0000 mg | ORAL_TABLET | Freq: Two times a day (BID) | ORAL | Status: DC

## 2011-11-23 NOTE — Assessment & Plan Note (Signed)
Mood stable in clinic but patient with irritability/vast mood fluctuations at home. Patient did not want to continue discussing at this time. Told patient we would continue at future visit. She is resistant to psychology or psychiatry. I am uncertain about lithium given history of overdose although patient claims unintentional.

## 2011-11-23 NOTE — Progress Notes (Signed)
  Subjective:    Patient ID: [name], female    DOB: [birth year], 51 y.o.   MRN: [id number]  HPI  1. UTI-patient took complete course of Keflex 500mg  x 7 days. Continues to have increased urination and dysuria. Up x3 nightly to urinate. No back pain, fevers, chills.   2. History of Bipolar-patient stopped taking lithium 2 years ago and Seroquel 1 year ago. She states seroquel made her feel groggy. Lithium history-reports an overdose several years ago. Says she did not have a problem with the medication though and would take again. She is unclear why she stopped taking it. She claims she was told by a psychologist to take multiple pills at once. Claims got dehydrated and in hospital for a week. Will need to review recrods. Because of this, she refuses to see a psychologist or psychiatrist. Reports mood as happy go lucky sometimes then irritable soon and then tearful at other times. Daughte present and says mother has a volatile temper but overall is a joy to be around (since she understands mothers mood can swing heavily). Does have a history of poor sleeping and some shopping sprees. Patient becomes tearful during interview and afterwards says she would like to continue conversation at a later time. Once again, refusing psychology or psychiatry but would consider taking Lithium again.     Review of Systems -See HPI  Past Medical History-DM, HTN, bipolar.  Reviewed problem list.  Medications- reviewed and updated Chief complaint-noted      Objective:   Physical Exam  Physical Exam  Constitutional: She is oriented to person, place, and time. She appears well-developed and well-nourished. No distress.  Obese female sitting on exam table.  Intermittently tearful during interview.  Head: Normocephalic and atraumatic.  Mouth/Throat: Oropharynx is clear and moist.   Poor dentition with multiple cavities and cracked teeth.   Eyes: Conjunctivae and EOM are normal. Pupils are equal, round,  and reactive to light.  Neck: Normal range of motion. Neck supple.  Cardiovascular: Normal rate and regular rhythm.  Exam reveals no gallop and no friction rub.   No murmur heard. Distant heart sounds.   Pulmonary/Chest: Effort normal and breath sounds normal. She has no wheezes. She has no rales. Distant sounds. Prolonged expiration.   Abdominal: Soft. Bowel sounds are normal.  Musculoskeletal: Normal range of motion. She exhibits no edema.  Neurological: She is alert and oriented to person, place, and time.  Skin: Skin is warm and dry. She is not diaphoretic.     Assessment & Plan:

## 2011-11-23 NOTE — Assessment & Plan Note (Addendum)
Symptoms persistent and UA concerning for UTI after 7 days of Keflex. Obtained Urine culture, due to resistance will place patient on ciprofloxacin 750 mg BID for 7 days or until urine culture data available. Discussed plan with Dr. Raymondo Band of pharmacy.   Addendum 11/25/2011 1:22 PM: Urine culture with >40,000 E. COli. Does not specifically meet criteria of >100,000 but patient still symptomatic after 7 days of Keflex. Ordered Bactrim DS for 10 days for complicated UTI given DM and failed Keflex. Cancelled ordered cipro and called pharmacy to update. Called patient to update. Patient without fevers. She thinks she may be having some occasional slight low back pain but cant describe where it is so I cannot determine if this is CVA tenderness. Told patient if this continues or if she develops fevers to be seen immediately.

## 2011-11-23 NOTE — Assessment & Plan Note (Addendum)
Cr continued to be at baseline. Calculated Cr clearance of approximately 100. Possible previous acute insult.

## 2011-11-24 LAB — URINE CULTURE

## 2011-11-25 MED ORDER — SULFAMETHOXAZOLE-TMP DS 800-160 MG PO TABS: 1.0000 | ORAL_TABLET | Freq: Two times a day (BID) | ORAL | Status: AC

## 2011-11-25 NOTE — Progress Notes (Signed)
Addended by: Shelva Majestic on: 11/25/2011 01:24 PM   Modules accepted: Orders

## 2011-12-11 ENCOUNTER — Other Ambulatory Visit: Payer: Self-pay | Admitting: *Deleted

## 2011-12-11 DIAGNOSIS — I10 Essential (primary) hypertension: Secondary | ICD-10-CM

## 2011-12-11 DIAGNOSIS — E119 Type 2 diabetes mellitus without complications: Secondary | ICD-10-CM

## 2011-12-11 DIAGNOSIS — E785 Hyperlipidemia, unspecified: Secondary | ICD-10-CM

## 2011-12-11 MED ORDER — METOPROLOL TARTRATE 25 MG PO TABS: 12.5000 mg | ORAL_TABLET | Freq: Two times a day (BID) | ORAL | Status: DC

## 2011-12-11 MED ORDER — LISINOPRIL 10 MG PO TABS: 10.0000 mg | ORAL_TABLET | Freq: Every day | ORAL | Status: DC

## 2011-12-11 NOTE — Telephone Encounter (Signed)
Refilled lisinopril and metoprolol

## 2012-06-06 ENCOUNTER — Ambulatory Visit (INDEPENDENT_AMBULATORY_CARE_PROVIDER_SITE_OTHER): Payer: 59 | Admitting: Family Medicine

## 2012-06-06 ENCOUNTER — Encounter: Payer: Self-pay | Admitting: Family Medicine

## 2012-06-06 VITALS — BP 154/76 | HR 94 | Ht 64.0 in | Wt 261.0 lb

## 2012-06-06 DIAGNOSIS — R300 Dysuria: Secondary | ICD-10-CM

## 2012-06-06 DIAGNOSIS — N390 Urinary tract infection, site not specified: Secondary | ICD-10-CM

## 2012-06-06 LAB — POCT URINALYSIS DIPSTICK
Bilirubin, UA: NEGATIVE
Ketones, UA: NEGATIVE
pH, UA: 6.5

## 2012-06-06 MED ORDER — SULFAMETHOXAZOLE-TRIMETHOPRIM 800-160 MG PO TABS: 1.0000 | ORAL_TABLET | Freq: Two times a day (BID) | ORAL | Status: DC

## 2012-06-06 NOTE — Progress Notes (Signed)
S: Pt comes in today for SDA for ?UTI.  Patient reports burning with urination, increased frequency x1 week. No hematuria.  + cold sweats since last night. + mid to low back pain- worse with walking, also for about 1 week; pain is achy with occasional sharp pains. +nausea for a few days, no vomiting. Always has diarrhea.  Feels weak and tired x 4-5 days.  Does have h/o kidney stones.   Has DM- CBGs are usually 120-160.  Denies tobacco use.    ROS: Per HPI  History  Smoking status  . Former Smoker  Smokeless tobacco  . Not on file    O:  Filed Vitals:   06/06/12 1013  BP: 154/76  Pulse: 94    Gen: NAD CV: RRR, no murmur Pulm: CTA bilat, no wheezes or crackles Abd: soft, nontender, + R sided CVA tenderness, no L CVA tenderness    A/P: 51 y.o. female p/w complicated UTI vs pyelo -See problem list -f/u in 3 days if not improving

## 2012-06-06 NOTE — Assessment & Plan Note (Addendum)
Complicated UTI vs pyelo based on systemic symptoms + CVA tenderness on R.  Will treat with septra x14 days (previous culture 10/2011 showed resistance to cipro and levofloxacin). Send urine for culture in case of treatment failure.

## 2012-06-06 NOTE — Patient Instructions (Addendum)
It was nice to meet you today.  It looks like you have a urinary tract infection, just like you thought.  Since I'm worried that it might have gone to your right kidney, I am going to treat for a little longer to make sure we treat the whole infection.  Take the antibiotics twice a day for 14 days.   I am going to send your urine for culture so we can see what bacteria is causing it in case you don't start feeling better with the antibiotics.  Come back on Monday if you aren't feeling any better.  Go to the ER over the weekend if you start feeling worse, have high fevers, or your back pain gets worse.  Try to drink plenty of fluids.

## 2012-06-10 LAB — URINE CULTURE

## 2012-07-31 ENCOUNTER — Other Ambulatory Visit: Payer: Self-pay | Admitting: *Deleted

## 2012-07-31 DIAGNOSIS — E785 Hyperlipidemia, unspecified: Secondary | ICD-10-CM

## 2012-07-31 DIAGNOSIS — E119 Type 2 diabetes mellitus without complications: Secondary | ICD-10-CM

## 2012-07-31 DIAGNOSIS — I10 Essential (primary) hypertension: Secondary | ICD-10-CM

## 2012-07-31 MED ORDER — METOPROLOL TARTRATE 25 MG PO TABS: 12.5000 mg | ORAL_TABLET | Freq: Two times a day (BID) | ORAL | Status: DC

## 2012-07-31 MED ORDER — LISINOPRIL 10 MG PO TABS: 10.0000 mg | ORAL_TABLET | Freq: Every day | ORAL | Status: DC

## 2012-09-21 ENCOUNTER — Emergency Department (HOSPITAL_COMMUNITY)
Admission: EM | Admit: 2012-09-21 | Discharge: 2012-09-21 | Disposition: A | Discharge status: Home / Self Care | Payer: Self-pay | Attending: Emergency Medicine | Admitting: Emergency Medicine

## 2012-09-21 ENCOUNTER — Encounter (HOSPITAL_COMMUNITY): Payer: Self-pay | Admitting: *Deleted

## 2012-09-21 DIAGNOSIS — Z7982 Long term (current) use of aspirin: Secondary | ICD-10-CM | POA: Insufficient documentation

## 2012-09-21 DIAGNOSIS — Z8659 Personal history of other mental and behavioral disorders: Secondary | ICD-10-CM | POA: Insufficient documentation

## 2012-09-21 DIAGNOSIS — Z794 Long term (current) use of insulin: Secondary | ICD-10-CM | POA: Insufficient documentation

## 2012-09-21 DIAGNOSIS — Z8742 Personal history of other diseases of the female genital tract: Secondary | ICD-10-CM | POA: Insufficient documentation

## 2012-09-21 DIAGNOSIS — E119 Type 2 diabetes mellitus without complications: Secondary | ICD-10-CM | POA: Insufficient documentation

## 2012-09-21 DIAGNOSIS — R300 Dysuria: Secondary | ICD-10-CM | POA: Insufficient documentation

## 2012-09-21 DIAGNOSIS — Z79899 Other long term (current) drug therapy: Secondary | ICD-10-CM | POA: Insufficient documentation

## 2012-09-21 DIAGNOSIS — E785 Hyperlipidemia, unspecified: Secondary | ICD-10-CM | POA: Insufficient documentation

## 2012-09-21 DIAGNOSIS — N390 Urinary tract infection, site not specified: Secondary | ICD-10-CM

## 2012-09-21 DIAGNOSIS — I10 Essential (primary) hypertension: Secondary | ICD-10-CM | POA: Insufficient documentation

## 2012-09-21 DIAGNOSIS — R5383 Other fatigue: Secondary | ICD-10-CM | POA: Insufficient documentation

## 2012-09-21 DIAGNOSIS — R5381 Other malaise: Secondary | ICD-10-CM | POA: Insufficient documentation

## 2012-09-21 DIAGNOSIS — Z87891 Personal history of nicotine dependence: Secondary | ICD-10-CM | POA: Insufficient documentation

## 2012-09-21 LAB — POCT I-STAT, CHEM 8
Calcium, Ion: 1.23 mmol/L (ref 1.12–1.23)
Chloride: 105 mEq/L (ref 96–112)
HCT: 48 % — ABNORMAL HIGH (ref 36.0–46.0)
TCO2: 27 mmol/L (ref 0–100)

## 2012-09-21 LAB — URINALYSIS, ROUTINE W REFLEX MICROSCOPIC
Ketones, ur: NEGATIVE mg/dL
Nitrite: POSITIVE — AB
pH: 6 (ref 5.0–8.0)

## 2012-09-21 LAB — URINE MICROSCOPIC-ADD ON

## 2012-09-21 MED ORDER — SULFAMETHOXAZOLE-TMP DS 800-160 MG PO TABS
1.0000 | ORAL_TABLET | Freq: Once | ORAL | Status: AC
  Administered 2012-09-21: 1 via ORAL
  Filled 2012-09-21: qty 1

## 2012-09-21 MED ORDER — SULFAMETHOXAZOLE-TRIMETHOPRIM 800-160 MG PO TABS: 1.0000 | ORAL_TABLET | Freq: Two times a day (BID) | ORAL | Status: DC

## 2012-09-21 MED ORDER — PHENAZOPYRIDINE HCL 200 MG PO TABS: 200.0000 mg | ORAL_TABLET | Freq: Three times a day (TID) | ORAL | Status: DC

## 2012-09-21 NOTE — ED Notes (Signed)
Pt reports having generalized weakness and fatigue after mild exertion, has been occuring over extended amount of time. Now also having mid back pain and intermittent pain with urination, thinks its all related to her kidneys. No acute distress noted at triage.

## 2012-09-21 NOTE — ED Notes (Signed)
Pt attempted UA and dropped in the toilet

## 2012-09-21 NOTE — ED Provider Notes (Signed)
Medical screening examination/treatment/procedure(s) were performed by non-physician practitioner and as supervising physician I was immediately available for consultation/collaboration.    Dayquan Buys D Bayne Fosnaugh, MD 09/21/12 1711 

## 2012-09-21 NOTE — ED Provider Notes (Signed)
History     CSN: [id number]  Arrival date & time 09/21/12  1251   First MD Initiated Contact with Patient 09/21/12 1352      Chief Complaint  Patient presents with  . Back Pain    (Consider location/radiation/quality/duration/timing/severity/associated sxs/prior treatment) HPI Comments: Patient with history of diabetes, kidney stones, past urinary tract infections -- presents with complaint of back pain with occasional dysuria the past 2 days. She endorses generalized weakness. No fevers, nausea or vomiting. No abdominal pain or diarrhea. She denies increased frequency or hematuria. She states that her sugars have been running about 200. No red flag signs and symptoms of lower back pain. The treatments prior to arrival. Onset of symptoms gradual. Course is constant. Nothing makes symptoms better or worse. Patient does not feel like this is her usual kidney stone pain.  Patient is a 52 y.o. female presenting with back pain. The history is provided by the patient.  Back Pain Associated symptoms: dysuria   Associated symptoms: no abdominal pain, no chest pain, no fever and no headaches     Past Medical History  Diagnosis Date  . DEPRESSION, MAJOR, RECURRENT 08/29/2006  . DISORDER, BIPOLAR NOS 09/09/2006  . BACK PAIN, LOW 08/29/2006  . OVARIAN MASS 09/18/2007    Benign ovarian cyst s/p laparotomy for removal   . DMII (diabetes mellitus, type 2) 09/19/2010  . Hyperlipidemia 09/19/2010    LDL 78 on 08/2010. Recheck 08/2011.    Marland Kitchen HYPERTENSION, BENIGN SYSTEMIC 08/29/2006    Well controlled on Lopressor and lisinopril. Check BMET 12/2011    . TOBACCO DEPENDENCE 08/29/2006    Patient quit in early months of 2012. Continue to follow for possible relapse.      Past Surgical History  Procedure Laterality Date  . Partial hysterectomy      unsure if had cervix removed.     History reviewed. No pertinent family history.  History  Substance Use Topics  . Smoking status: Former Games developer  .  Smokeless tobacco: Not on file  . Alcohol Use: Not on file    OB History   Grav Para Term Preterm Abortions TAB SAB Ect Mult Living                  Review of Systems  Constitutional: Positive for fatigue. Negative for fever.  HENT: Negative for sore throat and rhinorrhea.   Eyes: Negative for redness.  Respiratory: Negative for cough.   Cardiovascular: Negative for chest pain.  Gastrointestinal: Negative for nausea, vomiting, abdominal pain and diarrhea.  Genitourinary: Positive for dysuria. Negative for frequency, vaginal bleeding and vaginal discharge.  Musculoskeletal: Positive for back pain. Negative for myalgias.  Skin: Negative for rash.  Neurological: Negative for headaches.    Allergies  Review of patient's allergies indicates no known allergies.  Home Medications   Current Outpatient Rx  Name  Route  Sig  Dispense  Refill  . aspirin 81 MG EC tablet   Oral   Take 81 mg by mouth daily.           . insulin NPH (HUMULIN N,NOVOLIN N) 100 UNIT/ML injection      12 units before breakfast, 10 units before dinner.   1 vial   3   . lisinopril (PRINIVIL,ZESTRIL) 10 MG tablet   Oral   Take 1 tablet (10 mg total) by mouth daily.   30 tablet   5   . metoprolol tartrate (LOPRESSOR) 25 MG tablet   Oral   Take 0.5  tablets (12.5 mg total) by mouth 2 (two) times daily.   30 tablet   5   . Multiple Vitamin (MULTIVITAMIN WITH MINERALS) TABS   Oral   Take 1 tablet by mouth daily.         . simvastatin (ZOCOR) 20 MG tablet   Oral   Take 1 tablet (20 mg total) by mouth every evening.   30 tablet   11     BP 126/82  Pulse 110  Temp(Src) 98 F (36.7 C) (Oral)  Resp 18  SpO2 95%  Physical Exam  Nursing note and vitals reviewed. Constitutional: She appears well-developed and well-nourished.  HENT:  Head: Normocephalic and atraumatic.  Eyes: Conjunctivae are normal. Right eye exhibits no discharge. Left eye exhibits no discharge.  Neck: Normal range of  motion. Neck supple.  Cardiovascular: Normal rate, regular rhythm and normal heart sounds.   Pulmonary/Chest: Effort normal and breath sounds normal.  Abdominal: Soft. There is no tenderness.  Musculoskeletal: She exhibits tenderness.       Cervical back: Normal.       Thoracic back: Normal.       Lumbar back: She exhibits tenderness. She exhibits normal range of motion and no bony tenderness.       Back:  Neurological: She is alert.  Normal strength and sensation in upper and lower extremities bilaterally.  Skin: Skin is warm and dry.  Psychiatric: She has a normal mood and affect.    ED Course  Procedures (including critical care time)  Labs Reviewed  URINALYSIS, ROUTINE W REFLEX MICROSCOPIC - Abnormal; Notable for the following:    APPearance TURBID (*)    Hgb urine dipstick LARGE (*)    Protein, ur 30 (*)    Nitrite POSITIVE (*)    Leukocytes, UA LARGE (*)    All other components within normal limits  URINE MICROSCOPIC-ADD ON - Abnormal; Notable for the following:    Bacteria, UA MANY (*)    All other components within normal limits  POCT I-STAT, CHEM 8 - Abnormal; Notable for the following:    BUN 24 (*)    Glucose, Bld 217 (*)    Hemoglobin 16.3 (*)    HCT 48.0 (*)    All other components within normal limits  URINE CULTURE   No results found.   1. Complicated UTI (urinary tract infection)     3:21 PM Patient seen and examined. Work-up initiated.   Vital signs reviewed and are as follows: Filed Vitals:   09/21/12 1309  BP: 126/82  Pulse: 110  Temp: 98 F (36.7 C)  Resp: 18   3:42 PM patient with nitroglycerin positive urinary tract infection/pyelonephritis. Patient continues to appear well. She is afebrile and has not had any vomiting. Urine culture from 06/2012 shows susceptibility to Septra. Will treat as complicated UTI.  Patient urged to return with worsening symptoms, high fever, persistent vomiting, or any other concerns. Urged primary care  physician followup in the coming week for recheck. Patient verbalizes understanding and agrees with plan.   MDM  Patient with complicated urinary tract infection. Will treat with two-week course of Bactrim. Patient has back pain with fever or vomiting. Antibiotics given in emergency department. Patient appears well, nontoxic. She is not tachycardic on my exam and I do not suspect urosepsis.        Renne Crigler, PA-C 09/21/12 1552

## 2012-09-23 LAB — URINE CULTURE

## 2012-09-24 ENCOUNTER — Telehealth (HOSPITAL_COMMUNITY): Payer: Self-pay | Admitting: Emergency Medicine

## 2012-09-24 NOTE — ED Notes (Signed)
Results received from [facility]. (+) URNC  Rx given in ED for Sulfa Trimeth -> sensitive to the same.  Chart appended per protocol. 

## 2012-09-26 ENCOUNTER — Encounter (HOSPITAL_COMMUNITY): Payer: Self-pay | Admitting: Emergency Medicine

## 2012-09-26 ENCOUNTER — Inpatient Hospital Stay (HOSPITAL_COMMUNITY)
Admission: EM | Admit: 2012-09-26 | Discharge: 2012-09-28 | DRG: 872 | Disposition: A | Discharge status: Home / Self Care | Payer: MEDICAID | Attending: Family Medicine | Admitting: Family Medicine

## 2012-09-26 ENCOUNTER — Emergency Department (HOSPITAL_COMMUNITY): Payer: Self-pay

## 2012-09-26 DIAGNOSIS — N12 Tubulo-interstitial nephritis, not specified as acute or chronic: Secondary | ICD-10-CM

## 2012-09-26 DIAGNOSIS — N182 Chronic kidney disease, stage 2 (mild): Secondary | ICD-10-CM | POA: Diagnosis present

## 2012-09-26 DIAGNOSIS — E872 Acidosis, unspecified: Secondary | ICD-10-CM | POA: Diagnosis present

## 2012-09-26 DIAGNOSIS — R6521 Severe sepsis with septic shock: Secondary | ICD-10-CM

## 2012-09-26 DIAGNOSIS — N179 Acute kidney failure, unspecified: Secondary | ICD-10-CM

## 2012-09-26 DIAGNOSIS — N200 Calculus of kidney: Secondary | ICD-10-CM | POA: Diagnosis present

## 2012-09-26 DIAGNOSIS — E785 Hyperlipidemia, unspecified: Secondary | ICD-10-CM

## 2012-09-26 DIAGNOSIS — F339 Major depressive disorder, recurrent, unspecified: Secondary | ICD-10-CM

## 2012-09-26 DIAGNOSIS — F17200 Nicotine dependence, unspecified, uncomplicated: Secondary | ICD-10-CM

## 2012-09-26 DIAGNOSIS — I10 Essential (primary) hypertension: Secondary | ICD-10-CM

## 2012-09-26 DIAGNOSIS — E860 Dehydration: Secondary | ICD-10-CM | POA: Diagnosis present

## 2012-09-26 DIAGNOSIS — N390 Urinary tract infection, site not specified: Secondary | ICD-10-CM

## 2012-09-26 DIAGNOSIS — E66813 Obesity, class 3: Secondary | ICD-10-CM

## 2012-09-26 DIAGNOSIS — F319 Bipolar disorder, unspecified: Secondary | ICD-10-CM

## 2012-09-26 DIAGNOSIS — F3130 Bipolar disorder, current episode depressed, mild or moderate severity, unspecified: Secondary | ICD-10-CM | POA: Diagnosis present

## 2012-09-26 DIAGNOSIS — Z794 Long term (current) use of insulin: Secondary | ICD-10-CM

## 2012-09-26 DIAGNOSIS — N1330 Unspecified hydronephrosis: Secondary | ICD-10-CM | POA: Diagnosis present

## 2012-09-26 DIAGNOSIS — A419 Sepsis, unspecified organism: Principal | ICD-10-CM

## 2012-09-26 DIAGNOSIS — R6520 Severe sepsis without septic shock: Secondary | ICD-10-CM | POA: Diagnosis present

## 2012-09-26 DIAGNOSIS — Z79899 Other long term (current) drug therapy: Secondary | ICD-10-CM

## 2012-09-26 DIAGNOSIS — Z6841 Body Mass Index (BMI) 40.0 and over, adult: Secondary | ICD-10-CM

## 2012-09-26 DIAGNOSIS — E118 Type 2 diabetes mellitus with unspecified complications: Secondary | ICD-10-CM | POA: Diagnosis present

## 2012-09-26 DIAGNOSIS — E119 Type 2 diabetes mellitus without complications: Secondary | ICD-10-CM

## 2012-09-26 HISTORY — DX: Renal tubulo-interstitial disease, unspecified: N15.9

## 2012-09-26 LAB — CBC
HCT: 46.8 % — ABNORMAL HIGH (ref 36.0–46.0)
Hemoglobin: 16.8 g/dL — ABNORMAL HIGH (ref 12.0–15.0)
MCH: 31.5 pg (ref 26.0–34.0)
MCHC: 35.9 g/dL (ref 30.0–36.0)
MCV: 87.6 fL (ref 78.0–100.0)
RBC: 5.34 MIL/uL — ABNORMAL HIGH (ref 3.87–5.11)

## 2012-09-26 LAB — COMPREHENSIVE METABOLIC PANEL
AST: 34 U/L (ref 0–37)
CO2: 20 mEq/L (ref 19–32)
Calcium: 10.1 mg/dL (ref 8.4–10.5)
Chloride: 99 mEq/L (ref 96–112)
Creatinine, Ser: 2.09 mg/dL — ABNORMAL HIGH (ref 0.50–1.10)
GFR calc Af Amer: 31 mL/min — ABNORMAL LOW (ref >90)
GFR calc non Af Amer: 26 mL/min — ABNORMAL LOW (ref >90)
Glucose, Bld: 155 mg/dL — ABNORMAL HIGH (ref 70–99)
Total Bilirubin: 0.2 mg/dL — ABNORMAL LOW (ref 0.3–1.2)

## 2012-09-26 LAB — CG4 I-STAT (LACTIC ACID): Lactic Acid, Venous: 3.5 mmol/L — ABNORMAL HIGH (ref 0.5–2.2)

## 2012-09-26 LAB — PRO B NATRIURETIC PEPTIDE: Pro B Natriuretic peptide (BNP): 23.1 pg/mL (ref 0–125)

## 2012-09-26 LAB — POCT I-STAT 3, VENOUS BLOOD GAS (G3P V)
Bicarbonate: 20.7 mEq/L (ref 20.0–24.0)
pCO2, Ven: 35.8 mmHg — ABNORMAL LOW (ref 45.0–50.0)
pO2, Ven: 59 mmHg — ABNORMAL HIGH (ref 30.0–45.0)

## 2012-09-26 LAB — POCT I-STAT TROPONIN I

## 2012-09-26 MED ORDER — SODIUM CHLORIDE 0.9 % IV BOLUS (SEPSIS)
2000.0000 mL | Freq: Once | INTRAVENOUS | Status: AC
  Administered 2012-09-26: 2000 mL via INTRAVENOUS

## 2012-09-26 MED ORDER — DEXTROSE 5 % IV SOLN
1.0000 g | Freq: Once | INTRAVENOUS | Status: AC
  Administered 2012-09-27: 1 g via INTRAVENOUS
  Filled 2012-09-26: qty 10

## 2012-09-26 MED ORDER — SODIUM CHLORIDE 0.9 % IV BOLUS (SEPSIS)
1000.0000 mL | Freq: Once | INTRAVENOUS | Status: DC
  Administered 2012-09-26: 1000 mL via INTRAVENOUS

## 2012-09-26 MED ORDER — DEXTROSE 5 % IV SOLN
1.0000 g | Freq: Two times a day (BID) | INTRAVENOUS | Status: DC
  Filled 2012-09-26 (×2): qty 1

## 2012-09-26 MED ORDER — SODIUM CHLORIDE 0.9 % IV BOLUS (SEPSIS)
1000.0000 mL | Freq: Once | INTRAVENOUS | Status: AC
  Administered 2012-09-27: 1000 mL via INTRAVENOUS

## 2012-09-26 NOTE — ED Provider Notes (Signed)
History     CSN: [id number]  Arrival date & time 09/26/12  2022   First MD Initiated Contact with Patient 09/26/12 2117      Chief Complaint  Patient presents with  . Dizziness  . Shortness of Breath    (Consider location/radiation/quality/duration/timing/severity/associated sxs/prior treatment) HPI Comments: Pt with hx of IDDM comes in with cc of back pain, sweats, dizziness. Pt reports that she started feeling unwell on Sunday, and was seen in the ED after that, diagnosed with a UTI and sent home with Bactrim. She has been taking her meds - however, not feeling better. Today, after lunch, she started having sporadic and spontaneous episodes of breaking out in "cold sweats" and having associated dib and dizziness. She denies any chest pain. No hx of DVT,. PE and no risk factors for the same. Pt has no known cardiac disease. She states that the pain is her back has worsened slightly as well.   Patient is a 52 y.o. female presenting with shortness of breath. The history is provided by the patient and medical records.  Shortness of Breath Associated symptoms: abdominal pain   Associated symptoms: no chest pain, no cough, no fever, no neck pain, no vomiting and no wheezing     Past Medical History  Diagnosis Date  . DEPRESSION, MAJOR, RECURRENT 08/29/2006  . DISORDER, BIPOLAR NOS 09/09/2006  . BACK PAIN, LOW 08/29/2006  . OVARIAN MASS 09/18/2007    Benign ovarian cyst s/p laparotomy for removal   . DMII (diabetes mellitus, type 2) 09/19/2010  . Hyperlipidemia 09/19/2010    LDL 78 on 08/2010. Recheck 08/2011.    Marland Kitchen HYPERTENSION, BENIGN SYSTEMIC 08/29/2006    Well controlled on Lopressor and lisinopril. Check BMET 12/2011    . TOBACCO DEPENDENCE 08/29/2006    Patient quit in early months of 2012. Continue to follow for possible relapse.    . Kidney infection     Past Surgical History  Procedure Laterality Date  . Partial hysterectomy      unsure if had cervix removed.     No family  history on file.  History  Substance Use Topics  . Smoking status: Current Every Day Smoker  . Smokeless tobacco: Not on file  . Alcohol Use: No    OB History   Grav Para Term Preterm Abortions TAB SAB Ect Mult Living                  Review of Systems  Constitutional: Positive for fatigue. Negative for fever and activity change.  HENT: Negative for facial swelling and neck pain.   Respiratory: Positive for shortness of breath. Negative for cough and wheezing.   Cardiovascular: Negative for chest pain.  Gastrointestinal: Positive for abdominal pain. Negative for nausea, vomiting, diarrhea, constipation, blood in stool and abdominal distention.  Genitourinary: Negative for hematuria and difficulty urinating.  Skin: Negative for color change.  Neurological: Positive for dizziness, weakness and light-headedness. Negative for speech difficulty.  Hematological: Does not bruise/bleed easily.  Psychiatric/Behavioral: Negative for confusion.    Allergies  Review of patient's allergies indicates no known allergies.  Home Medications   Current Outpatient Rx  Name  Route  Sig  Dispense  Refill  . aspirin 81 MG EC tablet   Oral   Take 81 mg by mouth daily.          . insulin NPH (HUMULIN N,NOVOLIN N) 100 UNIT/ML injection   Subcutaneous   Inject 10-12 Units into the skin 2 (two) times  daily. 12 units before breakfast, 10 units before dinner.         Marland Kitchen lisinopril (PRINIVIL,ZESTRIL) 10 MG tablet   Oral   Take 1 tablet (10 mg total) by mouth daily.   30 tablet   5   . metoprolol tartrate (LOPRESSOR) 25 MG tablet   Oral   Take 0.5 tablets (12.5 mg total) by mouth 2 (two) times daily.   30 tablet   5   . Multiple Vitamin (MULTIVITAMIN WITH MINERALS) TABS   Oral   Take 1 tablet by mouth daily.         . simvastatin (ZOCOR) 20 MG tablet   Oral   Take 1 tablet (20 mg total) by mouth every evening.   30 tablet   11   . sulfamethoxazole-trimethoprim (BACTRIM DS,SEPTRA  DS) 800-160 MG per tablet   Oral   Take 1 tablet by mouth 2 (two) times daily.         . phenazopyridine (PYRIDIUM) 200 MG tablet   Oral   Take 200 mg by mouth 3 (three) times daily.           BP 118/26  Pulse 100  Temp(Src) 98.8 F (37.1 C) (Oral)  Resp 14  SpO2 100%  Physical Exam  Nursing note and vitals reviewed. Constitutional: She is oriented to person, place, and time. She appears well-developed.  HENT:  Head: Normocephalic and atraumatic.  Eyes: Conjunctivae and EOM are normal. Pupils are equal, round, and reactive to light.  Neck: Normal range of motion. Neck supple.  Cardiovascular: Normal rate, regular rhythm and normal heart sounds.   Pulmonary/Chest: Effort normal and breath sounds normal. No respiratory distress.  Abdominal: Soft. Bowel sounds are normal. She exhibits no distension. There is no tenderness. There is no rebound and no guarding.  Bilateral flank tenderness, right worse than left.  Neurological: She is alert and oriented to person, place, and time.  Skin: Skin is warm and dry.    ED Course  Procedures (including critical care time)  Labs Reviewed  CBC - Abnormal; Notable for the following:    WBC 11.0 (*)    RBC 5.34 (*)    Hemoglobin 16.8 (*)    HCT 46.8 (*)    All other components within normal limits  COMPREHENSIVE METABOLIC PANEL - Abnormal; Notable for the following:    Glucose, Bld 155 (*)    BUN 27 (*)    Creatinine, Ser 2.09 (*)    Alkaline Phosphatase 136 (*)    Total Bilirubin 0.2 (*)    GFR calc non Af Amer 26 (*)    GFR calc Af Amer 31 (*)    All other components within normal limits  POCT I-STAT 3, BLOOD GAS (G3P V) - Abnormal; Notable for the following:    pH, Ven 7.369 (*)    pCO2, Ven 35.8 (*)    pO2, Ven 59.0 (*)    Acid-base deficit 4.0 (*)    All other components within normal limits  CG4 I-STAT (LACTIC ACID) - Abnormal; Notable for the following:    Lactic Acid, Venous 3.50 (*)    All other components within  normal limits  URINE CULTURE  CULTURE, BLOOD (ROUTINE X 2)  CULTURE, BLOOD (ROUTINE X 2)  PRO B NATRIURETIC PEPTIDE  POCT I-STAT TROPONIN I   Dg Chest Port 1 View  09/26/2012  *RADIOLOGY REPORT*  Clinical Data: Dizziness, shortness of breath, low blood pressure  PORTABLE CHEST - 1 VIEW  Comparison: Portable  exam 2232 hours compared 06/19/2007  Findings: Limited by body habitus and technique. Upper normal heart size. Normal mediastinal contours and pulmonary vascularity for technique. Lordotic positioning. No gross infiltrate, pleural effusion or pneumothorax.  IMPRESSION: No definite acute abnormalities.   Original Report Authenticated By: Ulyses Southward, M.D.      No diagnosis found.    MDM   Date: 09/26/2012  Rate: 122  Rhythm: sinus tachycardia  QRS Axis: normal  Intervals: normal  ST/T Wave abnormalities: nonspecific ST/T changes  Conduction Disutrbances:none  Narrative Interpretation:   Old EKG Reviewed: unchanged  Pt comes in with cc of dizziness, dib. Pt noted to be profoundly hypotensive at arrival with tachycardia. She has known pyelonephritis. No underlying heart disease. She is diabetic, and continues to have back pain. No hx of AAA, and no clinical concern for the same. Abd exam is pretty non peritoneal.  We think patient is in septic shock right now, and we will start aggressive hydration. Ceftriaxone will be given based on sensitivities from the her previous UA.  11:53 PM Pt is now s/p 2 liters of fluid bolus. Her BP is showing MAP of 60-65. Continues to have good mentation. Labs show lactate of 3.5 at arrival. She has an AKI as well. I think she came in a septic shock patient who needed icu admission, but given how she has responded well to therapy here, [place] knowing the etiology/source of infection, and her being relatively young and besides diabebetes pretty healthy - she probably is a good candidate for step down icu. Repeat lactate ordered and pending.  CRITICAL  CARE Performed by: Derwood Kaplan   Total critical care time: 45 minutes Critical care time was exclusive of separately billable procedures and treating other patients.  Critical care was necessary to treat or prevent imminent or life-threatening deterioration.  Critical care was time spent personally by me on the following activities: development of treatment plan with patient and/or surrogate as well as nursing, discussions with consultants, evaluation of patient's response to treatment, examination of patient, obtaining history from patient or surrogate, ordering and performing treatments and interventions, ordering and review of laboratory studies, ordering and review of radiographic studies, pulse oximetry and re-evaluation of patient's condition.        Derwood Kaplan, MD 09/26/12 2355

## 2012-09-26 NOTE — ED Notes (Addendum)
Pt states she was seen in ED on Sunday and diagnosed with kidney infection.  Currently taking antibiotics.  Reports "lightheadedness, dizziness, sob, and cold sweats" that started today. Still having R flank pain.

## 2012-09-27 ENCOUNTER — Inpatient Hospital Stay (HOSPITAL_COMMUNITY): Payer: Self-pay

## 2012-09-27 DIAGNOSIS — N12 Tubulo-interstitial nephritis, not specified as acute or chronic: Secondary | ICD-10-CM | POA: Diagnosis present

## 2012-09-27 DIAGNOSIS — A419 Sepsis, unspecified organism: Secondary | ICD-10-CM

## 2012-09-27 DIAGNOSIS — N390 Urinary tract infection, site not specified: Secondary | ICD-10-CM

## 2012-09-27 HISTORY — DX: Sepsis, unspecified organism: N39.0

## 2012-09-27 HISTORY — DX: Sepsis, unspecified organism: A41.9

## 2012-09-27 LAB — HIV ANTIBODY (ROUTINE TESTING W REFLEX): HIV: NONREACTIVE

## 2012-09-27 LAB — COMPREHENSIVE METABOLIC PANEL
CO2: 19 mEq/L (ref 19–32)
Calcium: 8.2 mg/dL — ABNORMAL LOW (ref 8.4–10.5)
Creatinine, Ser: 1.41 mg/dL — ABNORMAL HIGH (ref 0.50–1.10)
GFR calc Af Amer: 49 mL/min — ABNORMAL LOW (ref >90)
GFR calc non Af Amer: 43 mL/min — ABNORMAL LOW (ref >90)
Glucose, Bld: 128 mg/dL — ABNORMAL HIGH (ref 70–99)
Total Protein: 5.9 g/dL — ABNORMAL LOW (ref 6.0–8.3)

## 2012-09-27 LAB — CG4 I-STAT (LACTIC ACID): Lactic Acid, Venous: 1.92 mmol/L (ref 0.5–2.2)

## 2012-09-27 LAB — URINE MICROSCOPIC-ADD ON

## 2012-09-27 LAB — URINALYSIS, ROUTINE W REFLEX MICROSCOPIC
Nitrite: NEGATIVE
Specific Gravity, Urine: 1.011 (ref 1.005–1.030)
Urobilinogen, UA: 0.2 mg/dL (ref 0.0–1.0)

## 2012-09-27 LAB — CBC
HCT: 36.6 % (ref 36.0–46.0)
Hemoglobin: 12.7 g/dL (ref 12.0–15.0)
RBC: 4.02 MIL/uL (ref 3.87–5.11)
WBC: 9.2 10*3/uL (ref 4.0–10.5)

## 2012-09-27 LAB — HEMOGLOBIN A1C: Mean Plasma Glucose: 174 mg/dL — ABNORMAL HIGH (ref <117)

## 2012-09-27 LAB — GLUCOSE, CAPILLARY
Glucose-Capillary: 121 mg/dL — ABNORMAL HIGH (ref 70–99)
Glucose-Capillary: 95 mg/dL (ref 70–99)
Glucose-Capillary: 118 mg/dL — ABNORMAL HIGH (ref 70–99)
Glucose-Capillary: 136 mg/dL — ABNORMAL HIGH (ref 70–99)

## 2012-09-27 MED ORDER — SODIUM CHLORIDE 0.9 % IJ SOLN
3.0000 mL | Freq: Two times a day (BID) | INTRAMUSCULAR | Status: DC
  Administered 2012-09-27 – 2012-09-28 (×2): 3 mL via INTRAVENOUS

## 2012-09-27 MED ORDER — SODIUM CHLORIDE 0.9 % IV SOLN: INTRAVENOUS | Status: DC

## 2012-09-27 MED ORDER — ASPIRIN 81 MG PO TBEC: 81.0000 mg | DELAYED_RELEASE_TABLET | Freq: Every day | ORAL | Status: DC

## 2012-09-27 MED ORDER — SODIUM CHLORIDE 0.9 % IV BOLUS (SEPSIS)
1000.0000 mL | Freq: Once | INTRAVENOUS | Status: AC
  Administered 2012-09-27: 1000 mL via INTRAVENOUS

## 2012-09-27 MED ORDER — INSULIN ASPART 100 UNIT/ML ~~LOC~~ SOLN
0.0000 [IU] | Freq: Three times a day (TID) | SUBCUTANEOUS | Status: DC
  Administered 2012-09-27 – 2012-09-28 (×2): 2 [IU] via SUBCUTANEOUS

## 2012-09-27 MED ORDER — DEXTROSE 5 % IV SOLN
1.0000 g | INTRAVENOUS | Status: DC
  Filled 2012-09-27: qty 10

## 2012-09-27 MED ORDER — SODIUM CHLORIDE 0.9 % IV SOLN
INTRAVENOUS | Status: DC
  Administered 2012-09-27: 1000 mL via INTRAVENOUS
  Administered 2012-09-27 (×2): via INTRAVENOUS

## 2012-09-27 MED ORDER — MORPHINE SULFATE 4 MG/ML IJ SOLN
4.0000 mg | INTRAMUSCULAR | Status: DC | PRN
  Administered 2012-09-27 – 2012-09-28 (×4): 4 mg via INTRAVENOUS
  Filled 2012-09-27 (×4): qty 1

## 2012-09-27 MED ORDER — ONDANSETRON HCL 4 MG PO TABS: 4.0000 mg | ORAL_TABLET | Freq: Four times a day (QID) | ORAL | Status: DC | PRN

## 2012-09-27 MED ORDER — HEPARIN SODIUM (PORCINE) 5000 UNIT/ML IJ SOLN
5000.0000 [IU] | Freq: Three times a day (TID) | INTRAMUSCULAR | Status: DC
  Administered 2012-09-27 – 2012-09-28 (×4): 5000 [IU] via SUBCUTANEOUS
  Filled 2012-09-27 (×7): qty 1

## 2012-09-27 MED ORDER — INSULIN ASPART 100 UNIT/ML ~~LOC~~ SOLN: 0.0000 [IU] | Freq: Every day | SUBCUTANEOUS | Status: DC

## 2012-09-27 MED ORDER — SIMVASTATIN 20 MG PO TABS
20.0000 mg | ORAL_TABLET | Freq: Every evening | ORAL | Status: DC
  Administered 2012-09-27 – 2012-09-28 (×2): 20 mg via ORAL
  Filled 2012-09-27 (×2): qty 1

## 2012-09-27 MED ORDER — ASPIRIN 81 MG PO CHEW
81.0000 mg | CHEWABLE_TABLET | Freq: Every day | ORAL | Status: DC
  Administered 2012-09-27 – 2012-09-28 (×2): 81 mg via ORAL
  Filled 2012-09-27 (×2): qty 1

## 2012-09-27 MED ORDER — ONDANSETRON HCL 4 MG/2ML IJ SOLN: 4.0000 mg | Freq: Four times a day (QID) | INTRAMUSCULAR | Status: DC | PRN

## 2012-09-27 NOTE — ED Notes (Signed)
Orthostatics not advised at this time due to low BP

## 2012-09-27 NOTE — Progress Notes (Signed)
Family Medicine Teaching Service Attending Note  I interviewed and examined patient [name] and reviewed their tests and x-rays.  I discussed with Dr. Durene Cal and reviewed their note for today.  I agree with their assessment and plan.

## 2012-09-27 NOTE — H&P (Signed)
Family Medicine Teaching Service Attending Note  I interviewed and examined patient [name] and reviewed their tests and x-rays.  I discussed with Dr. Mikel Cella and reviewed their note for today.  I agree with their assessment and plan.     Additionally  This am alert feels well. No lightheadness with standing This uti started with right flank pain Suspect stone with subsequent UTI. Check [place].  Urology consult if obstruction Continue IV antibiotics

## 2012-09-27 NOTE — Progress Notes (Signed)
Family Medicine Interval progress note:   S: Patient states she is feeling much better today and not lightheaded with antibiotics and IV fluid.   O: BP 106/55  Pulse 90  Temp(Src) 98.4 F (36.9 C) (Oral)  Resp 13  Ht 5\' 3"  (1.6 m)  SpO2 97% BP in room 111/60.   Gen: NAD, resting comfortably in chair, obese HEENT: slightly dry mucus membranes CV: RRR no mrg  Lungs: CTAB  Ext: no edema Skin: warm and dry, no rash  Neuro: grossly normal, alert and oriented x3, moves all extremities.   A/P:   52 year old female with severe sepsis and AKI due to urinary source and likely pyelonephritis.  -continue CTX and IVF at 150 ml/hr while holding home antihypertensives -f/u renal ultrasound-evaluate abscess -Patient much improved from Cr, blood pressure, tachycardia perspective -will discuss with Dr. Deirdre Priest transfer to telemetry or continue to monitor in stepdown unit.  -continue to follow blood and urine cultures -follow up HIV -continue to trend Cr. Cr down to 1.41 with hydration from peak >2 with baseline 1, likely due to prerenal etiology  Tana Conch, MD, PGY2 09/27/2012 10:23 AM

## 2012-09-27 NOTE — H&P (Signed)
History and Physical Note Family Medicine Teaching Service Tesslyn Baumert M. Mikel Cella, MD Service Pager: [phone]  [name] is an 52 y.o. female.   Chief Complaint: dizziness, back pain HPI: Pt is a 52 yo F with PMH of DM, HTN, HLD and tobacco dependence who was evaluated 5 days ago for dysuria/back pain and was diagnosed with pyelonephritis. She was sent home with Bactrim x2 weeks (which culture was susceptible to) but throughout the week she has been getting progressively more fatigued and continued to have right sided flank pain. She states that starting today she had dizziness and did not feel like herself so she came back to the ED for evaluation. She has been taking all of her antibiotics. She states she has been eating and drinking well. She is making urine and does not have any dysuria with urination. She denies any other infections or symptoms including congestion, cough, abdominal pain, N/V/D, rash, or foot ulcers. She does smoke cigarettes.  ED Course: At arrival to the ED she was hypotensive and tachycardic, but afebrile. Labs initially showed a mild lactic acidosis to 3.5 and WBC to 11.0. She was bolused 2 liters of fluid with improvement of her tachycardia and blood pressure. Her lactate also came down to <2.0. Patient stated she was starting to feel slightly better after fluids. 3rd liter of fluid ordered. Also, her labs show AKI with elevation of her Creat to 2.09 (baseline around 1.0). She meets sepsis criteria and has failed outpatient treatment so FMTS was called for admission.   Past Medical History  Diagnosis Date  . DEPRESSION, MAJOR, RECURRENT 08/29/2006  . DISORDER, BIPOLAR NOS 09/09/2006  . BACK PAIN, LOW 08/29/2006  . OVARIAN MASS 09/18/2007    Benign ovarian cyst s/p laparotomy for removal   . DMII (diabetes mellitus, type 2) 09/19/2010  . Hyperlipidemia 09/19/2010    LDL 78 on 08/2010. Recheck 08/2011.    Marland Kitchen HYPERTENSION, BENIGN SYSTEMIC 08/29/2006    Well controlled on  Lopressor and lisinopril. Check BMET 12/2011    . TOBACCO DEPENDENCE 08/29/2006    Patient quit in early months of 2012. Continue to follow for possible relapse.    . Kidney infection     Past Surgical History  Procedure Laterality Date  . Partial hysterectomy      unsure if had cervix removed.     No family history on file. Social History:  reports that she has been smoking.  She does not have any smokeless tobacco history on file. She reports that she does not drink alcohol or use illicit drugs.  Allergies: No Known Allergies   (Not in a hospital admission)  Results for orders placed during the hospital encounter of 09/26/12 (from the past 48 hour(s))  CBC     Status: Abnormal   Collection Time    09/26/12  9:10 PM      Result Value Range   WBC 11.0 (*) 4.0 - 10.5 K/uL   RBC 5.34 (*) 3.87 - 5.11 MIL/uL   Hemoglobin 16.8 (*) 12.0 - 15.0 g/dL   HCT 21.3 (*) 08.6 - 57.8 %   MCV 87.6  78.0 - 100.0 fL   MCH 31.5  26.0 - 34.0 pg   MCHC 35.9  30.0 - 36.0 g/dL   RDW 46.9  62.9 - 52.8 %   Platelets 359  150 - 400 K/uL  PRO B NATRIURETIC PEPTIDE     Status: None   Collection Time    09/26/12  9:10 PM  Result Value Range   Pro B Natriuretic peptide (BNP) 23.1  0 - 125 pg/mL  COMPREHENSIVE METABOLIC PANEL     Status: Abnormal   Collection Time    09/26/12  9:10 PM      Result Value Range   Sodium 135  135 - 145 mEq/L   Potassium 4.4  3.5 - 5.1 mEq/L   Chloride 99  96 - 112 mEq/L   CO2 20  19 - 32 mEq/L   Glucose, Bld 155 (*) 70 - 99 mg/dL   BUN 27 (*) 6 - 23 mg/dL   Creatinine, Ser 1.61 (*) 0.50 - 1.10 mg/dL   Calcium 09.6  8.4 - 04.5 mg/dL   Total Protein 8.1  6.0 - 8.3 g/dL   Albumin 3.7  3.5 - 5.2 g/dL   AST 34  0 - 37 U/L   ALT 32  0 - 35 U/L   Alkaline Phosphatase 136 (*) 39 - 117 U/L   Total Bilirubin 0.2 (*) 0.3 - 1.2 mg/dL   GFR calc non Af Amer 26 (*) >90 mL/min   GFR calc Af Amer 31 (*) >90 mL/min   Comment:            The eGFR has been calculated      using the CKD EPI equation.     This calculation has not been     validated in all clinical     situations.     eGFR's persistently     <90 mL/min signify     possible Chronic Kidney Disease.  POCT I-STAT TROPONIN I     Status: None   Collection Time    09/26/12  9:26 PM      Result Value Range   Troponin i, poc 0.00  0.00 - 0.08 ng/mL   Comment 3            Comment: Due to the release kinetics of cTnI,     a negative result within the first hours     of the onset of symptoms does not rule out     myocardial infarction with certainty.     If myocardial infarction is still suspected,     repeat the test at appropriate intervals.  POCT I-STAT 3, BLOOD GAS (G3P V)     Status: Abnormal   Collection Time    09/26/12 10:28 PM      Result Value Range   pH, Ven 7.369 (*) 7.250 - 7.300   pCO2, Ven 35.8 (*) 45.0 - 50.0 mmHg   pO2, Ven 59.0 (*) 30.0 - 45.0 mmHg   Bicarbonate 20.7  20.0 - 24.0 mEq/L   TCO2 22  0 - 100 mmol/L   O2 Saturation 90.0     Acid-base deficit 4.0 (*) 0.0 - 2.0 mmol/L   Patient temperature 98.0 F     Collection site BRACHIAL ARTERY     Sample type VENOUS    CG4 I-STAT (LACTIC ACID)     Status: Abnormal   Collection Time    09/26/12 10:29 PM      Result Value Range   Lactic Acid, Venous 3.50 (*) 0.5 - 2.2 mmol/L  URINALYSIS, ROUTINE W REFLEX MICROSCOPIC     Status: Abnormal   Collection Time    09/27/12 12:01 AM      Result Value Range   Color, Urine YELLOW  YELLOW   APPearance CLOUDY (*) CLEAR   Specific Gravity, Urine 1.011  1.005 - 1.030   pH  6.5  5.0 - 8.0   Glucose, UA NEGATIVE  NEGATIVE mg/dL   Hgb urine dipstick LARGE (*) NEGATIVE   Bilirubin Urine NEGATIVE  NEGATIVE   Ketones, ur NEGATIVE  NEGATIVE mg/dL   Protein, ur 30 (*) NEGATIVE mg/dL   Urobilinogen, UA 0.2  0.0 - 1.0 mg/dL   Nitrite NEGATIVE  NEGATIVE   Leukocytes, UA LARGE (*) NEGATIVE  URINE MICROSCOPIC-ADD ON     Status: Abnormal   Collection Time    09/27/12 12:01 AM      Result  Value Range   Squamous Epithelial / LPF MANY (*) RARE   WBC, UA 21-50  <3 WBC/hpf   RBC / HPF 11-20  <3 RBC/hpf   Bacteria, UA FEW (*) RARE   Casts HYALINE CASTS (*) NEGATIVE  CG4 I-STAT (LACTIC ACID)     Status: None   Collection Time    09/27/12 12:10 AM      Result Value Range   Lactic Acid, Venous 1.92  0.5 - 2.2 mmol/L   Dg Chest Port 1 View  09/26/2012  *RADIOLOGY REPORT*  Clinical Data: Dizziness, shortness of breath, low blood pressure  PORTABLE CHEST - 1 VIEW  Comparison: Portable exam 2232 hours compared 06/19/2007  Findings: Limited by body habitus and technique. Upper normal heart size. Normal mediastinal contours and pulmonary vascularity for technique. Lordotic positioning. No gross infiltrate, pleural effusion or pneumothorax.  IMPRESSION: No definite acute abnormalities.   Original Report Authenticated By: Ulyses Southward, M.D.    Review of Systems  Constitutional: Positive for malaise/fatigue. Negative for fever and chills.  HENT: Negative for congestion.   Respiratory: Negative for shortness of breath.   Cardiovascular: Negative for chest pain.  Gastrointestinal: Negative for abdominal pain.  Genitourinary: Positive for flank pain. Negative for urgency.  Musculoskeletal: Positive for back pain.  Skin: Negative for rash.  Neurological: Positive for weakness.   Blood pressure 105/46, pulse 91, temperature 98.8 F (37.1 C), temperature source Oral, resp. rate 19, SpO2 100.00%. Physical Exam  General: Flat on stretcher, awake, alert. Appears uncomfortable but appropriate.  HEENT: AT, [place]. Dry mucous membranes.  Neck: No LAD Cardio: Mild tachycardia, no murmur appreciated Pulm: Good effort, CTAB Back: Able to roll over on side without assistance. CVA tenderness on the right with TTP down right side. Abd: Obese. Soft, nontender. No suprapubic tenderness. Extremities: Moves all extremities. 2 PIV left AC and hand. No lower extremity edema. 1+pulses. Bottom of feet are dirty,  but no ulcers or callous appreciated Neuro: Awake, alert, oriented. Full neuro exam not fully examined, but grossly intact. Follows commands, moves all extremities, able to move without help  Assessment/Plan 52 yo F who failed outpatient UTI treatment now with urosepsis  # Sepsis- Pt has hypotension, tachycardia, mild leukocytosis, lactic acidosis (now resolved) and source. Spoke with EDP about ICU admission but given patients response to fluids, she can be admitted to SDU. Called charge nurse to confirm, and states patient is appropriate for SDU. - Admit to stepdown, attending Dr. Deirdre Priest - On 3rd bolus of fluids, appeared dry on exam. Consider another bolus, and start MIVF at 150cc/hr - Monitor BP. If MAP drops or remains low, will have low threshold for consulting PCCM. - Sepsis most likely from inadequately treated UTI. Pt's last culture was susceptible to bactrim, but she does not seem to have improved. Dosed with Ceftriaxone in the ED, will continue q 24 hours - Blood and urine cultures sent - Monitor on telemetry  # UTI-  -  Ceftriaxone (per above) - UA may not be clean catch, but still shows large leukocytes and HgB - Await culture - Morphine prn pain - Zofran prn nausea - Consider renal ultrasound for evaluation of pyelonephritis vs. Abscess or even a possible kidney stone with obstruction causing her pain  # AKI- - Likely secondary to infection and dehydration - Monitor Creat in the AM - Consider renal ultrasound (per above)  # DM- Last A1C <6 - Recheck A1C - SSI moderate scale - Continue ASA  # HTN-  - Holding all antihypertensives in setting of hypotension  # HLD- - Continue Simvastatin  # Tobacco use-  - Did not order nicotine patch but can consider adding later - Nursing to provide smoking cessation education prior to discharge  FEN/GI- NS @ 100cc/hr but can consider another bolus. Carb modified diet PPx- Heparin sub q Dispo- Pending clinical improvement and  further work up.   Code: FULL  Karis Emig 09/27/2012, 12:47 AM   ADDENDUM: Received call from RN that after last bolus was finished her BP dropped to 80's/30's with MAP of 47. Another bolus ordered with good response in BP. Spoke with Dr. Darrick Penna (PCCM E-link) who agrees that she should go to SDU at this time. Continue to bolus and keep fluids running. No O2 requirement and is otherwise stable at this time.   Toniesha Zellner M. Schelly Chuba, M.D. 09/27/2012 2:01 AM

## 2012-09-28 DIAGNOSIS — N179 Acute kidney failure, unspecified: Secondary | ICD-10-CM

## 2012-09-28 DIAGNOSIS — E785 Hyperlipidemia, unspecified: Secondary | ICD-10-CM

## 2012-09-28 DIAGNOSIS — I10 Essential (primary) hypertension: Secondary | ICD-10-CM

## 2012-09-28 DIAGNOSIS — F319 Bipolar disorder, unspecified: Secondary | ICD-10-CM

## 2012-09-28 DIAGNOSIS — R6520 Severe sepsis without septic shock: Secondary | ICD-10-CM

## 2012-09-28 DIAGNOSIS — E119 Type 2 diabetes mellitus without complications: Secondary | ICD-10-CM

## 2012-09-28 DIAGNOSIS — A419 Sepsis, unspecified organism: Principal | ICD-10-CM

## 2012-09-28 DIAGNOSIS — R6521 Severe sepsis with septic shock: Secondary | ICD-10-CM

## 2012-09-28 DIAGNOSIS — N12 Tubulo-interstitial nephritis, not specified as acute or chronic: Secondary | ICD-10-CM

## 2012-09-28 LAB — BASIC METABOLIC PANEL
BUN: 16 mg/dL (ref 6–23)
Chloride: 106 mEq/L (ref 96–112)
GFR calc Af Amer: 71 mL/min — ABNORMAL LOW (ref >90)
GFR calc non Af Amer: 61 mL/min — ABNORMAL LOW (ref >90)
Potassium: 4.4 mEq/L (ref 3.5–5.1)
Sodium: 138 mEq/L (ref 135–145)

## 2012-09-28 LAB — URINE CULTURE: Colony Count: 80000

## 2012-09-28 LAB — GLUCOSE, CAPILLARY: Glucose-Capillary: 105 mg/dL — ABNORMAL HIGH (ref 70–99)

## 2012-09-28 MED ORDER — HYDROXYZINE HCL 50 MG PO TABS
50.0000 mg | ORAL_TABLET | Freq: Three times a day (TID) | ORAL | Status: DC | PRN
  Administered 2012-09-28 (×2): 50 mg via ORAL
  Filled 2012-09-28 (×2): qty 1

## 2012-09-28 NOTE — Progress Notes (Signed)
PGY-2 Daily Progress Note Family Medicine Teaching Service Aldine Contes. Marti Sleigh, MD Service Pager: [phone]   Subjective: Patient actually complaining of right sided to mid back pain and not CVA tenderness.   Objective:  Temp:  [97.4 F (36.3 C)-98.3 F (36.8 C)] 97.4 F (36.3 C) (03/30 0717) Pulse Rate:  [77-108] 77 (03/30 0800) Resp:  [12-29] 13 (03/30 0800) BP: (94-151)/(41-95) 134/78 mmHg (03/30 0800) SpO2:  [94 %-99 %] 99 % (03/30 0800) Weight:  [284 lb 2.8 oz (128.9 kg)] 284 lb 2.8 oz (128.9 kg) (03/29 1232)  Intake/Output Summary (Last 24 hours) at 09/28/12 0904 Last data filed at 09/28/12 0857  Gross per 24 hour  Intake 4435.5 ml  Output   3375 ml  Net 1060.5 ml    Gen: NAD, resting comfortably in bed, obese  HEENT: slightly dry mucus membranes  CV: RRR no mrg  Lungs: CTAB  Ext: no edema  Skin: warm and dry, no rash  MSK: right CVA tenderness Neuro: grossly normal, alert and oriented x3, moves all extremities.  Labs and imaging:   CBC  Recent Labs Lab 09/21/12 1351 09/26/12 2110 09/27/12 0500  WBC  --  11.0* 9.2  HGB 16.3* 16.8* 12.7  HCT 48.0* 46.8* 36.6  PLT  --  359 262   BMET/CMET  Recent Labs Lab 09/26/12 2110 09/27/12 0500 09/28/12 0439  NA 135 139 138  K 4.4 4.2 4.4  CL 99 109 106  CO2 20 19 21   BUN 27* 21 16  CREATININE 2.09* 1.41* 1.05  CALCIUM 10.1 8.2* 9.1  PROT 8.1 5.9*  --   BILITOT 0.2* 0.1*  --   ALKPHOS 136* 124*  --   ALT 32 21  --   AST 34 20  --   GLUCOSE 155* 128* 143*    Ct Abdomen Pelvis Wo Contrast  09/27/2012  *RADIOLOGY REPORT*  Clinical Data: Right-sided flank pain.  Evaluate for obstructing kidney stone.  CT ABDOMEN AND PELVIS WITHOUT CONTRAST  Technique:  Multidetector CT imaging of the abdomen and pelvis was performed following the standard protocol without intravenous contrast.  Comparison: CT of the abdomen and pelvis 01/13/2010.  Findings:  Lung Bases: Linear opacities in the right middle lobe are similar  to the prior examination, most compatible with areas of mild chronic scarring.  Abdomen/Pelvis:  Image 38 of series 2 demonstrates a 16 x 11 mm calculus in the left renal pelvis.  This appears to be mildly obstructive as evidenced by mild left hydronephrosis.  Multiple additional nonobstructive calculi are noted in the collecting systems of the kidneys bilaterally, largest of which is on the right side of the right renal pelvis measuring 20 by 11 mm.  At this time, there are no definite ureteral calculi or bladder calculi.  Multiple small low attenuation lesions in the right kidney are similar to the prior examination and are incompletely characterized on today's noncontrast study (favored to represent small cysts given their stability in size over time).  Status post cholecystectomy.  Diffusely decreased attenuation throughout the hepatic parenchyma, compatible with hepatic steatosis.   The appearance of the pancreas, spleen and bilateral adrenal glands is unremarkable.  No significant volume of ascites. No pneumoperitoneum.  No pathologic distension of small bowel. Normal appendix.  No definite pathologic lymphadenopathy identified within the abdomen or pelvis on today's noncontrast CT examination. The uterus and right ovary are unremarkable in appearance. Probable left oophorectomy. Atherosclerosis throughout the abdominal and pelvic vasculature, without definite aneurysm. Urinary bladder is  normal in appearance.  Musculoskeletal: There are no aggressive appearing lytic or blastic lesions noted in the visualized portions of the skeleton.  IMPRESSION: 1.  The patient again has multiple nonobstructive calculi in the collecting systems of the kidneys bilaterally.  In addition, the largest stone in the left renal collecting system appears to be lodged in the left renal pelvis measuring 16 x 11 mm and is associated with some mild left-sided hydronephrosis suggesting some mild obstruction.  This is slightly increased  compared to the prior examination. 2.  No other acute findings in the abdomen or pelvis.  3.  Normal appendix. 4.  Hepatic steatosis. 5.  Status post cholecystectomy and likely left oophorectomy. 6.  Mild atherosclerosis.   Original Report Authenticated By: Trudie Reed, M.D.    US Renal  09/27/2012  *RADIOLOGY REPORT*  Clinical Data: Urosepsis/pyelonephritis.  History of hypertension, diabetes and renal insufficiency.  RENAL/URINARY TRACT ULTRASOUND COMPLETE  Comparison:  Abdominal pelvic CT 01/13/2010.  Renal ultrasound 12/26/2008.  Findings:  Examination is limited by body habitus.  Right Kidney:  There is a small cyst in the lower pole, measuring 2.0 cm maximally.  Multiple echogenic shadowing foci are seen within the kidneys consistent with calculi as demonstrated on prior CT.  There is no hydronephrosis or perinephric fluid collection. Renal length 12.5 cm.  Left Kidney:  The left kidney demonstrates moderate pelvocaliectasis.  There are several echogenic shadowing foci consistent with calculi.  The previously noted calculus in the left renal pelvis is not clearly demonstrated. There is no evidence of perinephric fluid collection.  Renal length 13.0 cm.  Bladder:  Suboptimally visualized.  Neither ureteral jet is clearly visualized.  IMPRESSION:  1.  Left-sided hydronephrosis suspicious for recurrent obstructing ureteral or pelvic calculus.  No perinephric fluid collection identified.  Consider CT for further evaluation. 2.  Multiple echogenic shadowing foci in both kidneys consistent with calculi as demonstrated on prior CT.   Original Report Authenticated By: Carey Bullocks, M.D.    Dg Chest Port 1 View  09/26/2012  *RADIOLOGY REPORT*  Clinical Data: Dizziness, shortness of breath, low blood pressure  PORTABLE CHEST - 1 VIEW  Comparison: Portable exam 2232 hours compared 06/19/2007  Findings: Limited by body habitus and technique. Upper normal heart size. Normal mediastinal contours and pulmonary  vascularity for technique. Lordotic positioning. No gross infiltrate, pleural effusion or pneumothorax.  IMPRESSION: No definite acute abnormalities.   Original Report Authenticated By: Ulyses Southward, M.D.     I have reviewed the patient's current medications. Scheduled: . aspirin  81 mg Oral Daily  . cefTRIAXone (ROCEPHIN)  IV  1 g Intravenous Q24H  . heparin  5,000 Units Subcutaneous Q8H  . insulin aspart  0-15 Units Subcutaneous TID WC  . insulin aspart  0-5 Units Subcutaneous QHS  . simvastatin  20 mg Oral QPM  . sodium chloride  3 mL Intravenous Q12H   Continuous: . sodium chloride 10 mL/hr at 09/28/12 0857   ZOX:[id number] injection, ondansetron (ZOFRAN) IV, ondansetron  Assessment  52 yo F who failed outpatient UTI treatment now with presumed urosepsis.  52 year old female with severe sepsis and AKI likely due to urinary source (failed outpatient treatment) and likely pyelonephritis with obstructing stone in L renal pelvis with mild hydronephrosis.   #UTI/pyelonephritis with obstructing stone in L renal pelvis -sepsis has resolved. Required >5 L fluid in treatment.  -continue CTX and follow urine culture and blood culture -renal u/s and CT show hydronephrosis. Spoke with Dr. Brunilda Payor who  states no inpatient treatment. Treat infection and patient to follow up in office. Will follow up his note later today.  -appreciate Dr. Deirdre Priest input on tx to floor bed given no intervention planned.  -HIV negative.  -Morphine prn pain, zofran for nausea  #AKI-resolved with hydration  #HTN/hypotension-BP improved. KVO fluids. Restart BP meds as blood pressure allows. Continue ASA.  #HLD-continue statin.  # DM- Last A1C <6. New a1c 7.7. Metformin likely at discharge. SSI moderate in house.  # Tobacco use- encourage cessation. Nicotine patch at patient request.   FEN/GI- KVO fluids. Carb modified diet  PPx- Heparin sub q  Dispo- Pending clinical improvement and further work up. Consider  Code:  FULL  Tana Conch, MD PGY2, Family Medicine Teaching Service [phone]

## 2012-09-28 NOTE — Progress Notes (Signed)
Family Medicine Teaching Service Attending Note  I interviewed and examined patient [name] and reviewed their tests and x-rays.  I discussed with Dr. Durene Cal and reviewed their note for today.  I agree with their assessment and plan.     Additionally  Await Urology opinion with complicated UTI who developed sepsis on appropriate oral antibiotics

## 2012-09-28 NOTE — Consult Note (Addendum)
Urology Consult  Referring physician:  Dr Pearlean Brownie                                    Dr Tana Conch Reason for referral: Nephrolithiasis  Chief Complaint: Right sided flank and back pain.  History of Present Illness: 52 years old female admitted on 3/29 with increasing fatigue, right flank pain.  She was on SMX-TMP for E. Coli pyelonephritis.  However she did notice any improvement of her symptoms and went back to the ER where she was found to be hypotensive and tachycardic.  She did not have any fever and her white count was 11.0.  She felt better after rehydration.  Her creatinine went up to 2.09 from a baseline of 1.0.  Renal ultrasound showed moderate left hydronephrosis.  CT scan revealed bilateral renal calculi.  The largest one in the right renal pelvis measures 30 x 11 mm.  There is a 16 x 11 mm calculus in the left renal pelvis with minimal hydronephrosis.  There are several bilateral calculi.  She had right percutaneous nephrolithotomy in January 2009 by Dr Retta Diones.  She was lost to follow-up and has not been seen at the office since February 2009.  CT scan in 2011 showed 3 small non obstructing right renal calculi and a 1.5 cm calculus left renal pelvis calculus.  The left renal calculus has not significantly increased in size since 2011.There are significant changes in the number and size of the right renal stones.  There are no ureteral or bladder calculi. She is S/ P cholecystectomy.  Serum calcium is 9.1.  Creatinine is down to 1.41.  She is afebrile, has mild right flank and back pain.  She does not have dysuria, frequency or hematuria.  Urine culture on 3/28 shows 80,000 colonies multiple species, probably contamination.  Urine culture on 3/23 was positive for E. Coli sensitive to SMX-TMP and nitrofurantoin.  Past Medical History  Diagnosis Date  . DEPRESSION, MAJOR, RECURRENT 08/29/2006  . DISORDER, BIPOLAR NOS 09/09/2006  . BACK PAIN, LOW 08/29/2006  . OVARIAN MASS  09/18/2007    Benign ovarian cyst s/p laparotomy for removal   . DMII (diabetes mellitus, type 2) 09/19/2010  . Hyperlipidemia 09/19/2010    LDL 78 on 08/2010. Recheck 08/2011.    Marland Kitchen HYPERTENSION, BENIGN SYSTEMIC 08/29/2006    Well controlled on Lopressor and lisinopril. Check BMET 12/2011    . TOBACCO DEPENDENCE 08/29/2006    Patient quit in early months of 2012. Continue to follow for possible relapse.    . Kidney infection    Past Surgical History  Procedure Laterality Date  . Partial hysterectomy      unsure if had cervix removed.     Medications: Aspirin, Insulin, Lisinopril, metoprolol, Zocor, Maxipime Allergies: No Known Allergies  No family history on file. Social History:  reports that she has been smoking.  She does not have any smokeless tobacco history on file. She reports that she does not drink alcohol or use illicit drugs.  ROS: All systems are reviewed and negative except as noted.   Physical Exam:  Vital signs in last 24 hours: Temp:  [97.4 F (36.3 C)-97.9 F (36.6 C)] 97.9 F (36.6 C) (03/30 1600) Pulse Rate:  [77-108] 80 (03/30 1700) Resp:  [12-29] 15 (03/30 1700) BP: (74-170)/(41-95) 127/57 mmHg (03/30 1700) SpO2:  [94 %-100 %] 97 % (03/30 1700) HEENT: Normal.  No  cervical adenopathy.  No thyromegaly. Cardiovascular: Skin warm; not flushed Respiratory: Breaths quiet; no shortness of breath Abdomen: Obese. No masses.  Moderate tenderness right flank and back.  No left sided tenderness Neurological: Normal sensation to touch Musculoskeletal: Normal motor function arms and legs Lymphatics: No inguinal adenopathy Skin: No rashes   Laboratory Data:  Results for orders placed during the hospital encounter of 09/26/12 (from the past 72 hour(s))  CBC     Status: Abnormal   Collection Time    09/26/12  9:10 PM      Result Value Range   WBC 11.0 (*) 4.0 - 10.5 K/uL   RBC 5.34 (*) 3.87 - 5.11 MIL/uL   Hemoglobin 16.8 (*) 12.0 - 15.0 g/dL   HCT 16.1 (*) 09.6 -  46.0 %   MCV 87.6  78.0 - 100.0 fL   MCH 31.5  26.0 - 34.0 pg   MCHC 35.9  30.0 - 36.0 g/dL   RDW 04.5  40.9 - 81.1 %   Platelets 359  150 - 400 K/uL  PRO B NATRIURETIC PEPTIDE     Status: None   Collection Time    09/26/12  9:10 PM      Result Value Range   Pro B Natriuretic peptide (BNP) 23.1  0 - 125 pg/mL  COMPREHENSIVE METABOLIC PANEL     Status: Abnormal   Collection Time    09/26/12  9:10 PM      Result Value Range   Sodium 135  135 - 145 mEq/L   Potassium 4.4  3.5 - 5.1 mEq/L   Chloride 99  96 - 112 mEq/L   CO2 20  19 - 32 mEq/L   Glucose, Bld 155 (*) 70 - 99 mg/dL   BUN 27 (*) 6 - 23 mg/dL   Creatinine, Ser 9.14 (*) 0.50 - 1.10 mg/dL   Calcium 78.2  8.4 - 95.6 mg/dL   Total Protein 8.1  6.0 - 8.3 g/dL   Albumin 3.7  3.5 - 5.2 g/dL   AST 34  0 - 37 U/L   ALT 32  0 - 35 U/L   Alkaline Phosphatase 136 (*) 39 - 117 U/L   Total Bilirubin 0.2 (*) 0.3 - 1.2 mg/dL   GFR calc non Af Amer 26 (*) >90 mL/min   GFR calc Af Amer 31 (*) >90 mL/min   Comment:            The eGFR has been calculated     using the CKD EPI equation.     This calculation has not been     validated in all clinical     situations.     eGFR's persistently     <90 mL/min signify     possible Chronic Kidney Disease.  POCT I-STAT TROPONIN I     Status: None   Collection Time    09/26/12  9:26 PM      Result Value Range   Troponin i, poc 0.00  0.00 - 0.08 ng/mL   Comment 3            Comment: Due to the release kinetics of cTnI,     a negative result within the first hours     of the onset of symptoms does not rule out     myocardial infarction with certainty.     If myocardial infarction is still suspected,     repeat the test at appropriate intervals.  CULTURE, BLOOD (ROUTINE X 2)  Status: None   Collection Time    09/26/12  9:58 PM      Result Value Range   Specimen Description BLOOD LEFT FOREARM     Special Requests BOTTLES DRAWN AEROBIC AND ANAEROBIC 5CC EACH     Culture  Setup Time  09/27/2012 04:08     Culture       Value:        BLOOD CULTURE RECEIVED NO GROWTH TO DATE CULTURE WILL BE HELD FOR 5 DAYS BEFORE ISSUING A FINAL NEGATIVE REPORT   Report Status PENDING    CULTURE, BLOOD (ROUTINE X 2)     Status: None   Collection Time    09/26/12 10:20 PM      Result Value Range   Specimen Description BLOOD RIGHT HAND     Special Requests BOTTLES DRAWN AEROBIC AND ANAEROBIC 10CC EACH     Culture  Setup Time 09/27/2012 04:09     Culture       Value:        BLOOD CULTURE RECEIVED NO GROWTH TO DATE CULTURE WILL BE HELD FOR 5 DAYS BEFORE ISSUING A FINAL NEGATIVE REPORT   Report Status PENDING    POCT I-STAT 3, BLOOD GAS (G3P V)     Status: Abnormal   Collection Time    09/26/12 10:28 PM      Result Value Range   pH, Ven 7.369 (*) 7.250 - 7.300   pCO2, Ven 35.8 (*) 45.0 - 50.0 mmHg   pO2, Ven 59.0 (*) 30.0 - 45.0 mmHg   Bicarbonate 20.7  20.0 - 24.0 mEq/L   TCO2 22  0 - 100 mmol/L   O2 Saturation 90.0     Acid-base deficit 4.0 (*) 0.0 - 2.0 mmol/L   Patient temperature 98.0 F     Collection site BRACHIAL ARTERY     Sample type VENOUS    CG4 I-STAT (LACTIC ACID)     Status: Abnormal   Collection Time    09/26/12 10:29 PM      Result Value Range   Lactic Acid, Venous 3.50 (*) 0.5 - 2.2 mmol/L  URINE CULTURE     Status: None   Collection Time    09/26/12 11:05 PM      Result Value Range   Specimen Description URINE, CLEAN CATCH     Special Requests dib, hypotension     Culture  Setup Time 09/27/2012 03:35     Colony Count 80,000 COLONIES/ML     Culture       Value: Multiple bacterial morphotypes present, none predominant. Suggest appropriate recollection if clinically indicated.   Report Status 09/28/2012 FINAL    URINALYSIS, ROUTINE W REFLEX MICROSCOPIC     Status: Abnormal   Collection Time    09/27/12 12:01 AM      Result Value Range   Color, Urine YELLOW  YELLOW   APPearance CLOUDY (*) CLEAR   Specific Gravity, Urine 1.011  1.005 - 1.030   pH 6.5  5.0 -  8.0   Glucose, UA NEGATIVE  NEGATIVE mg/dL   Hgb urine dipstick LARGE (*) NEGATIVE   Bilirubin Urine NEGATIVE  NEGATIVE   Ketones, ur NEGATIVE  NEGATIVE mg/dL   Protein, ur 30 (*) NEGATIVE mg/dL   Urobilinogen, UA 0.2  0.0 - 1.0 mg/dL   Nitrite NEGATIVE  NEGATIVE   Leukocytes, UA LARGE (*) NEGATIVE  URINE MICROSCOPIC-ADD ON     Status: Abnormal   Collection Time    09/27/12 12:01 AM  Result Value Range   Squamous Epithelial / LPF MANY (*) RARE   WBC, UA 21-50  <3 WBC/hpf   RBC / HPF 11-20  <3 RBC/hpf   Bacteria, UA FEW (*) RARE   Casts HYALINE CASTS (*) NEGATIVE  CG4 I-STAT (LACTIC ACID)     Status: None   Collection Time    09/27/12 12:10 AM      Result Value Range   Lactic Acid, Venous 1.92  0.5 - 2.2 mmol/L  MRSA PCR SCREENING     Status: None   Collection Time    09/27/12  3:51 AM      Result Value Range   MRSA by PCR NEGATIVE  NEGATIVE   Comment:            The GeneXpert MRSA Assay (FDA     approved for NASAL specimens     only), is one component of a     comprehensive MRSA colonization     surveillance program. It is not     intended to diagnose MRSA     infection nor to guide or     monitor treatment for     MRSA infections.  HEMOGLOBIN A1C     Status: Abnormal   Collection Time    09/27/12  5:00 AM      Result Value Range   Hemoglobin A1C 7.7 (*) <5.7 %   Comment: (NOTE)                                                                               According to the ADA Clinical Practice Recommendations for 2011, when     HbA1c is used as a screening test:      >=6.5%   Diagnostic of Diabetes Mellitus               (if abnormal result is confirmed)     5.7-6.4%   Increased risk of developing Diabetes Mellitus     References:Diagnosis and Classification of Diabetes Mellitus,Diabetes     Care,2011,34(Suppl 1):S62-S69 and Standards of Medical Care in             Diabetes - 2011,Diabetes Care,2011,34 (Suppl 1):S11-S61.   Mean Plasma Glucose 174 (*) <117  mg/dL  COMPREHENSIVE METABOLIC PANEL     Status: Abnormal   Collection Time    09/27/12  5:00 AM      Result Value Range   Sodium 139  135 - 145 mEq/L   Potassium 4.2  3.5 - 5.1 mEq/L   Chloride 109  96 - 112 mEq/L   Comment: DELTA CHECK NOTED   CO2 19  19 - 32 mEq/L   Glucose, Bld 128 (*) 70 - 99 mg/dL   BUN 21  6 - 23 mg/dL   Creatinine, Ser 1.61 (*) 0.50 - 1.10 mg/dL   Calcium 8.2 (*) 8.4 - 10.5 mg/dL   Total Protein 5.9 (*) 6.0 - 8.3 g/dL   Albumin 2.7 (*) 3.5 - 5.2 g/dL   AST 20  0 - 37 U/L   ALT 21  0 - 35 U/L   Alkaline Phosphatase 124 (*) 39 - 117 U/L   Total Bilirubin 0.1 (*) 0.3 -  1.2 mg/dL   GFR calc non Af Amer 43 (*) >90 mL/min   GFR calc Af Amer 49 (*) >90 mL/min   Comment:            The eGFR has been calculated     using the CKD EPI equation.     This calculation has not been     validated in all clinical     situations.     eGFR's persistently     <90 mL/min signify     possible Chronic Kidney Disease.  CBC     Status: None   Collection Time    09/27/12  5:00 AM      Result Value Range   WBC 9.2  4.0 - 10.5 K/uL   RBC 4.02  3.87 - 5.11 MIL/uL   Hemoglobin 12.7  12.0 - 15.0 g/dL   Comment: REPEATED TO VERIFY   HCT 36.6  36.0 - 46.0 %   MCV 91.0  78.0 - 100.0 fL   MCH 31.6  26.0 - 34.0 pg   MCHC 34.7  30.0 - 36.0 g/dL   RDW 45.4  09.8 - 11.9 %   Platelets 262  150 - 400 K/uL   Comment: REPEATED TO VERIFY     DELTA CHECK NOTED  GLUCOSE, CAPILLARY     Status: Abnormal   Collection Time    09/27/12  7:44 AM      Result Value Range   Glucose-Capillary 121 (*) 70 - 99 mg/dL  HIV ANTIBODY (ROUTINE TESTING)     Status: None   Collection Time    09/27/12  8:11 AM      Result Value Range   HIV NON REACTIVE  NON REACTIVE  GLUCOSE, CAPILLARY     Status: None   Collection Time    09/27/12 12:34 PM      Result Value Range   Glucose-Capillary 95  70 - 99 mg/dL  GLUCOSE, CAPILLARY     Status: Abnormal   Collection Time    09/27/12  5:01 PM      Result  Value Range   Glucose-Capillary 118 (*) 70 - 99 mg/dL  GLUCOSE, CAPILLARY     Status: Abnormal   Collection Time    09/27/12  9:43 PM      Result Value Range   Glucose-Capillary 136 (*) 70 - 99 mg/dL  BASIC METABOLIC PANEL     Status: Abnormal   Collection Time    09/28/12  4:39 AM      Result Value Range   Sodium 138  135 - 145 mEq/L   Potassium 4.4  3.5 - 5.1 mEq/L   Chloride 106  96 - 112 mEq/L   CO2 21  19 - 32 mEq/L   Glucose, Bld 143 (*) 70 - 99 mg/dL   BUN 16  6 - 23 mg/dL   Creatinine, Ser 1.47  0.50 - 1.10 mg/dL   Calcium 9.1  8.4 - 82.9 mg/dL   GFR calc non Af Amer 61 (*) >90 mL/min   GFR calc Af Amer 71 (*) >90 mL/min   Comment:            The eGFR has been calculated     using the CKD EPI equation.     This calculation has not been     validated in all clinical     situations.     eGFR's persistently     <90 mL/min signify     possible Chronic  Kidney Disease.  GLUCOSE, CAPILLARY     Status: Abnormal   Collection Time    09/28/12  7:02 AM      Result Value Range   Glucose-Capillary 105 (*) 70 - 99 mg/dL   Comment 1 Documented in Chart     Comment 2 Notify RN    GLUCOSE, CAPILLARY     Status: Abnormal   Collection Time    09/28/12 12:09 PM      Result Value Range   Glucose-Capillary 102 (*) 70 - 99 mg/dL   Recent Results (from the past 240 hour(s))  URINE CULTURE     Status: None   Collection Time    09/21/12  2:57 PM      Result Value Range Status   Specimen Description URINE, RANDOM   Final   Special Requests NONE   Final   Culture  Setup Time 09/21/2012 22:26   Final   Colony Count >=100,000 COLONIES/ML   Final   Culture ESCHERICHIA COLI   Final   Report Status 09/23/2012 FINAL   Final   Organism ID, Bacteria ESCHERICHIA COLI   Final  CULTURE, BLOOD (ROUTINE X 2)     Status: None   Collection Time    09/26/12  9:58 PM      Result Value Range Status   Specimen Description BLOOD LEFT FOREARM   Final   Special Requests BOTTLES DRAWN AEROBIC AND  ANAEROBIC 5CC EACH   Final   Culture  Setup Time 09/27/2012 04:08   Final   Culture     Final   Value:        BLOOD CULTURE RECEIVED NO GROWTH TO DATE CULTURE WILL BE HELD FOR 5 DAYS BEFORE ISSUING A FINAL NEGATIVE REPORT   Report Status PENDING   Incomplete  CULTURE, BLOOD (ROUTINE X 2)     Status: None   Collection Time    09/26/12 10:20 PM      Result Value Range Status   Specimen Description BLOOD RIGHT HAND   Final   Special Requests BOTTLES DRAWN AEROBIC AND ANAEROBIC 10CC EACH   Final   Culture  Setup Time 09/27/2012 04:09   Final   Culture     Final   Value:        BLOOD CULTURE RECEIVED NO GROWTH TO DATE CULTURE WILL BE HELD FOR 5 DAYS BEFORE ISSUING A FINAL NEGATIVE REPORT   Report Status PENDING   Incomplete  URINE CULTURE     Status: None   Collection Time    09/26/12 11:05 PM      Result Value Range Status   Specimen Description URINE, CLEAN CATCH   Final   Special Requests dib, hypotension   Final   Culture  Setup Time 09/27/2012 03:35   Final   Colony Count 80,000 COLONIES/ML   Final   Culture     Final   Value: Multiple bacterial morphotypes present, none predominant. Suggest appropriate recollection if clinically indicated.   Report Status 09/28/2012 FINAL   Final  MRSA PCR SCREENING     Status: None   Collection Time    09/27/12  3:51 AM      Result Value Range Status   MRSA by PCR NEGATIVE  NEGATIVE Final   Comment:            The GeneXpert MRSA Assay (FDA     approved for NASAL specimens     only), is one component of a     comprehensive  MRSA colonization     surveillance program. It is not     intended to diagnose MRSA     infection nor to guide or     monitor treatment for     MRSA infections.   Creatinine:  Recent Labs  09/26/12 2110 09/27/12 0500 09/28/12 0439  CREATININE 2.09* 1.41* 1.05    Xrays: See report/chart   Impression/Assessment:  Bilateral renal calculi. Mild left hydronephrosis.  UTI  Plan:  She needs planned staged  bilateral percutaneous nephrolithotomies in order to render her stone free as much as possible.  She does not need any urgent procedure .  There is mild left hydronephrosis.  There is no indication for stent placement or percutaneous nephrostomies at this time to drain either kidneys.  We need to keep her infection free as much as possible ; the stones could be the source or the consequences of her UTI.  Would keep her on suppressive antibiotics till she can have the procedures.  Will notify Dr Retta Diones of her admission.  If she is ready for discharge she can be followed as outpatient and be scheduled for the procedures on an elective basis.   Luian Schumpert-HENRY 09/28/2012, 6:06 PM   CC:  Dr Pearlean Brownie          Dr Tana Conch

## 2012-09-29 ENCOUNTER — Telehealth: Payer: Self-pay | Admitting: Family Medicine

## 2012-09-29 MED ORDER — TRAMADOL HCL 50 MG PO TABS: 50.0000 mg | ORAL_TABLET | Freq: Three times a day (TID) | ORAL | Status: DC | PRN

## 2012-09-29 NOTE — Telephone Encounter (Signed)
Wants to know if something could be called in for back pain.  C/o "back pain near kidneys with pressure and throbbing."  Rates pain at 9/10.  Has taken Tylenol today without relief.  Denies any urinary symptoms or fever.  Hospital Dx:  Urosepsis, pyelonephritis, and acute kidney injury.  Patient is currently on an abx.  Will route note to Dr. Durene Cal for advice and call patient back.  Gaylene Brooks, RN

## 2012-09-29 NOTE — Telephone Encounter (Signed)
Pt just got out of hosp and has appt on Friday.  She is stating that the pain in her back is getting worse and not sure what to do

## 2012-09-29 NOTE — Telephone Encounter (Signed)
Returned call to patient and informed Rx sent to pharmacy.  Patient will call for SDA if pain not better before appt on Friday.  Gaylene Brooks, RN

## 2012-09-29 NOTE — Telephone Encounter (Signed)
Patient not discharged on pain medications. Discussed with intern who mentioned she did not have any complaints at time of d/c but appears she was getting morphine.   Will rx tramadol. Patient will need same day appointment if pain not controlled before appointment on Friday.

## 2012-09-29 NOTE — Discharge Summary (Addendum)
Physician Discharge Summary  Patient ID: [name] MRN: [id number] DOB: 11/17/1960 Age: 52 y.o.  Admit date: 09/26/2012 Discharge date: 09/28/2012  PCP: Tana Conch, MD  Consultants:[facility]    Discharge Diagnosis: Principal Problem:   Urosepsis Active Problems:   DMII (diabetes mellitus, type 2)   Hyperlipidemia   Obesity, Class III, BMI 40-49.9 (morbid obesity)   AKI (acute kidney injury)   Pyelonephritis    Hospital Course 52 year old female with severe sepsis and AKI likely due to urinary source (failed outpatient treatment with Bactrim) and likely pyelonephritis with obstructing stone in L renal pelvis with mild hydronephrosis.   1. UTI/pyelonephritis with obstructing stone in L renal pelvis   -Patient came in with severe sepsis and required >5L fluid bolus to be stablized in addition to IV ceftriaxone. Patient responded well to these treatments.   -renal u/s and CT show hydronephrosis. Spoke with Dr. Brunilda Payor who states no inpatient treatment. Treat infection and patient to follow up in office for extraction.   -Urine Culture with multiple bacterial morphotypes. Patient resumed bactrim at discharge and is to remain on it until follow up with urology as an obstructing left stone was noted.   -Blood Cultures pending at time of discharge.    -HIV negative.   #AKI-resolved with hydration from Creatinine of 2 back to baseline of 1, prerenal in etiology.  #HTN/hypotension-BP improved with hydration. Restart BP meds at discharge. Continued ASA.  #HLD-continue statin.  # DM- Last A1C <6. New a1c 7.7. Metformin to be discussed outpatient basis.  SSI moderate in house.  # Tobacco use- encouraged cessation.     Procedures/Imaging:  Ct Abdomen Pelvis Wo Contrast  09/27/2012  *RADIOLOGY REPORT*  Clinical Data: Right-sided flank pain.  Evaluate for obstructing kidney stone.  CT ABDOMEN AND PELVIS WITHOUT CONTRAST  Technique:  Multidetector CT imaging of the abdomen  and pelvis was performed following the standard protocol without intravenous contrast.  Comparison: CT of the abdomen and pelvis 01/13/2010.  Findings:  Lung Bases: Linear opacities in the right middle lobe are similar to the prior examination, most compatible with areas of mild chronic scarring.  Abdomen/Pelvis:  Image 38 of series 2 demonstrates a 16 x 11 mm calculus in the left renal pelvis.  This appears to be mildly obstructive as evidenced by mild left hydronephrosis.  Multiple additional nonobstructive calculi are noted in the collecting systems of the kidneys bilaterally, largest of which is on the right side of the right renal pelvis measuring 20 by 11 mm.  At this time, there are no definite ureteral calculi or bladder calculi.  Multiple small low attenuation lesions in the right kidney are similar to the prior examination and are incompletely characterized on today's noncontrast study (favored to represent small cysts given their stability in size over time).  Status post cholecystectomy.  Diffusely decreased attenuation throughout the hepatic parenchyma, compatible with hepatic steatosis.   The appearance of the pancreas, spleen and bilateral adrenal glands is unremarkable.  No significant volume of ascites. No pneumoperitoneum.  No pathologic distension of small bowel. Normal appendix.  No definite pathologic lymphadenopathy identified within the abdomen or pelvis on today's noncontrast CT examination. The uterus and right ovary are unremarkable in appearance. Probable left oophorectomy. Atherosclerosis throughout the abdominal and pelvic vasculature, without definite aneurysm. Urinary bladder is normal in appearance.  Musculoskeletal: There are no aggressive appearing lytic or blastic lesions noted in the visualized portions of the skeleton.  IMPRESSION: 1.  The patient again  has multiple nonobstructive calculi in the collecting systems of the kidneys bilaterally.  In addition, the largest stone in the  left renal collecting system appears to be lodged in the left renal pelvis measuring 16 x 11 mm and is associated with some mild left-sided hydronephrosis suggesting some mild obstruction.  This is slightly increased compared to the prior examination. 2.  No other acute findings in the abdomen or pelvis.  3.  Normal appendix. 4.  Hepatic steatosis. 5.  Status post cholecystectomy and likely left oophorectomy. 6.  Mild atherosclerosis.   Original Report Authenticated By: Trudie Reed, M.D.    US Renal  09/27/2012  *RADIOLOGY REPORT*  Clinical Data: Urosepsis/pyelonephritis.  History of hypertension, diabetes and renal insufficiency.  RENAL/URINARY TRACT ULTRASOUND COMPLETE  Comparison:  Abdominal pelvic CT 01/13/2010.  Renal ultrasound 12/26/2008.  Findings:  Examination is limited by body habitus.  Right Kidney:  There is a small cyst in the lower pole, measuring 2.0 cm maximally.  Multiple echogenic shadowing foci are seen within the kidneys consistent with calculi as demonstrated on prior CT.  There is no hydronephrosis or perinephric fluid collection. Renal length 12.5 cm.  Left Kidney:  The left kidney demonstrates moderate pelvocaliectasis.  There are several echogenic shadowing foci consistent with calculi.  The previously noted calculus in the left renal pelvis is not clearly demonstrated. There is no evidence of perinephric fluid collection.  Renal length 13.0 cm.  Bladder:  Suboptimally visualized.  Neither ureteral jet is clearly visualized.  IMPRESSION:  1.  Left-sided hydronephrosis suspicious for recurrent obstructing ureteral or pelvic calculus.  No perinephric fluid collection identified.  Consider CT for further evaluation. 2.  Multiple echogenic shadowing foci in both kidneys consistent with calculi as demonstrated on prior CT.   Original Report Authenticated By: Carey Bullocks, M.D.    Dg Chest Port 1 View  09/26/2012  *RADIOLOGY REPORT*  Clinical Data: Dizziness, shortness of breath, low  blood pressure  PORTABLE CHEST - 1 VIEW  Comparison: Portable exam 2232 hours compared 06/19/2007  Findings: Limited by body habitus and technique. Upper normal heart size. Normal mediastinal contours and pulmonary vascularity for technique. Lordotic positioning. No gross infiltrate, pleural effusion or pneumothorax.  IMPRESSION: No definite acute abnormalities.   Original Report Authenticated By: Ulyses Southward, M.D.     Labs  CBC  Recent Labs Lab 09/26/12 2110 09/27/12 0500  WBC 11.0* 9.2  HGB 16.8* 12.7  HCT 46.8* 36.6  PLT 359 262   BMET  Recent Labs Lab 09/26/12 2110 09/27/12 0500 09/28/12 0439  NA 135 139 138  K 4.4 4.2 4.4  CL 99 109 106  CO2 20 19 21   BUN 27* 21 16  CREATININE 2.09* 1.41* 1.05  CALCIUM 10.1 8.2* 9.1  PROT 8.1 5.9*  --   BILITOT 0.2* 0.1*  --   ALKPHOS 136* 124*  --   ALT 32 21  --   AST 34 20  --   GLUCOSE 155* 128* 143*   PRO B NATRIURETIC PEPTIDE     Status: None   Collection Time    09/26/12  9:10 PM      Result Value Range   Pro B Natriuretic peptide (BNP) 23.1  0 - 125 pg/mL  POCT I-STAT TROPONIN I     Status: None   Collection Time    09/26/12  9:26 PM      Result Value Range   Troponin i, poc 0.00  0.00 - 0.08 ng/mL  CULTURE, BLOOD (ROUTINE  X 2)     Status: None   Collection Time    09/26/12  9:58 PM      Result Value Range   Specimen Description BLOOD LEFT FOREARM     Special Requests BOTTLES DRAWN AEROBIC AND ANAEROBIC 5CC EACH     Culture  Setup Time 09/27/2012 04:08     Culture       Value:        BLOOD CULTURE RECEIVED NO GROWTH TO DATE CULTURE WILL BE HELD FOR 5 DAYS BEFORE ISSUING A FINAL NEGATIVE REPORT   Report Status PENDING    CULTURE, BLOOD (ROUTINE X 2)     Status: None   Collection Time    09/26/12 10:20 PM      Result Value Range   Specimen Description BLOOD RIGHT HAND     Special Requests BOTTLES DRAWN AEROBIC AND ANAEROBIC 10CC EACH     Culture  Setup Time 09/27/2012 04:09     Culture       Value:         BLOOD CULTURE RECEIVED NO GROWTH TO DATE CULTURE WILL BE HELD FOR 5 DAYS BEFORE ISSUING A FINAL NEGATIVE REPORT   Report Status PENDING    CG4 I-STAT (LACTIC ACID)     Status: Abnormal   Collection Time    09/26/12 10:29 PM      Result Value Range   Lactic Acid, Venous 3.50 (*) 0.5 - 2.2 mmol/L  URINE CULTURE     Status: None   Collection Time    09/26/12 11:05 PM      Result Value Range   Specimen Description URINE, CLEAN CATCH     Special Requests dib, hypotension     Culture  Setup Time 09/27/2012 03:35     Colony Count 80,000 COLONIES/ML     Culture       Value: Multiple bacterial morphotypes present, none predominant. Suggest appropriate recollection if clinically indicated.   Report Status 09/28/2012 FINAL    URINALYSIS, ROUTINE W REFLEX MICROSCOPIC     Status: Abnormal   Collection Time    09/27/12 12:01 AM      Result Value Range   Color, Urine YELLOW  YELLOW   APPearance CLOUDY (*) CLEAR   Specific Gravity, Urine 1.011  1.005 - 1.030   pH 6.5  5.0 - 8.0   Glucose, UA NEGATIVE  NEGATIVE mg/dL   Hgb urine dipstick LARGE (*) NEGATIVE   Bilirubin Urine NEGATIVE  NEGATIVE   Ketones, ur NEGATIVE  NEGATIVE mg/dL   Protein, ur 30 (*) NEGATIVE mg/dL   Urobilinogen, UA 0.2  0.0 - 1.0 mg/dL   Nitrite NEGATIVE  NEGATIVE   Leukocytes, UA LARGE (*) NEGATIVE  URINE MICROSCOPIC-ADD ON     Status: Abnormal   Collection Time    09/27/12 12:01 AM      Result Value Range   Squamous Epithelial / LPF MANY (*) RARE   WBC, UA 21-50  <3 WBC/hpf   RBC / HPF 11-20  <3 RBC/hpf   Bacteria, UA FEW (*) RARE   Casts HYALINE CASTS (*) NEGATIVE  CG4 I-STAT (LACTIC ACID)     Status: None   Collection Time    09/27/12 12:10 AM      Result Value Range   Lactic Acid, Venous 1.92  0.5 - 2.2 mmol/L  HEMOGLOBIN A1C     Status: Abnormal   Collection Time    09/27/12  5:00 AM      Result Value  Range   Hemoglobin A1C 7.7 (*) <5.7 %  COMPREHENSIVE METABOLIC PANEL     Status: Abnormal  CBC      Status: None  GLUCOSE, CAPILLARY     Status: Abnormal  HIV ANTIBODY (ROUTINE TESTING)     Status: None   Collection Time    09/27/12  8:11 AM      Result Value Range   HIV NON REACTIVE  NON REACTIVE       Patient condition at time of discharge/disposition: stable  Disposition-home   Follow up issues: 1. Follow up compliance with Bactrim. 2. Make sure patient has appointment at [facility] urology for stone removal.  3. Discuss starting metformin for DM.  4. Consider repeat BMET (AKI in hospital) and CBC (concern infection) as well as UA.   Discharge follow up:  Follow-up Information   Follow up with NESI,MARC-HENRY, MD. Schedule an appointment as soon as possible for a visit in 1 week.   Contact information:   [address] [place] [address] [phone]       Follow up with Tana Conch, MD. (Friday 4/4 @ 145 PM)    Contact information:   [address] [place] [address] [phone]      Discharge Orders   Future Appointments Provider Department Dept Phone   10/03/2012 1:45 PM Shelva Majestic, MD [facility] [phone]   Future Orders Complete By Expires     Call MD for:  persistant dizziness or light-headedness  As directed     Call MD for:  temperature >100.4  As directed     Increase activity slowly  As directed        Discharge Medications   Medication List    TAKE these medications       aspirin 81 MG EC tablet  Take 81 mg by mouth daily.     insulin NPH 100 UNIT/ML injection  Commonly known as:  HUMULIN N,NOVOLIN N  Inject 10-12 Units into the skin 2 (two) times daily. 12 units before breakfast, 10 units before dinner.     lisinopril 10 MG tablet  Commonly known as:  PRINIVIL,ZESTRIL  Take 1 tablet (10 mg total) by mouth daily.     metoprolol tartrate 25 MG tablet  Commonly known as:  LOPRESSOR  Take 0.5 tablets (12.5 mg total) by mouth 2 (two) times daily.      multivitamin with minerals Tabs  Take 1 tablet by mouth daily.     phenazopyridine 200 MG tablet  Commonly known as:  PYRIDIUM  Take 200 mg by mouth 3 (three) times daily.     simvastatin 20 MG tablet  Commonly known as:  ZOCOR  Take 1 tablet (20 mg total) by mouth every evening.     sulfamethoxazole-trimethoprim 800-160 MG per tablet  Commonly known as:  BACTRIM DS,SEPTRA DS  Take 1 tablet by mouth 2 (two) times daily.       Tana Conch, MD of [facility] 09/29/2012 9:04 AM

## 2012-09-30 LAB — GLUCOSE, CAPILLARY: Glucose-Capillary: 129 mg/dL — ABNORMAL HIGH (ref 70–99)

## 2012-09-30 NOTE — Discharge Summary (Signed)
I have reviewed this discharge summary and agree.    

## 2012-10-01 ENCOUNTER — Telehealth: Payer: Self-pay | Admitting: Family Medicine

## 2012-10-01 NOTE — Telephone Encounter (Signed)
Pt was in hospital last weekend and needs note for work stating that she was out and that she can return to work  pls fax to # [phone] attn: Alycia Rossetti

## 2012-10-01 NOTE — Telephone Encounter (Signed)
Spoke with Dr. Durene Cal, he is ok with giving her the letter.  Will fax to morrow. Karenna Romanoff, Maryjo [place]

## 2012-10-02 NOTE — Telephone Encounter (Signed)
Faxed. Melchor Kirchgessner Dawn  

## 2012-10-03 ENCOUNTER — Inpatient Hospital Stay: Payer: 59 | Admitting: Family Medicine

## 2012-10-03 ENCOUNTER — Ambulatory Visit (INDEPENDENT_AMBULATORY_CARE_PROVIDER_SITE_OTHER): Payer: Self-pay | Admitting: Family Medicine

## 2012-10-03 ENCOUNTER — Encounter: Payer: Self-pay | Admitting: Family Medicine

## 2012-10-03 VITALS — BP 110/64 | HR 95 | Temp 98.1°F | Ht 63.0 in | Wt 278.0 lb

## 2012-10-03 DIAGNOSIS — E119 Type 2 diabetes mellitus without complications: Secondary | ICD-10-CM

## 2012-10-03 DIAGNOSIS — I10 Essential (primary) hypertension: Secondary | ICD-10-CM

## 2012-10-03 DIAGNOSIS — N179 Acute kidney failure, unspecified: Secondary | ICD-10-CM

## 2012-10-03 DIAGNOSIS — N12 Tubulo-interstitial nephritis, not specified as acute or chronic: Secondary | ICD-10-CM

## 2012-10-03 DIAGNOSIS — I872 Venous insufficiency (chronic) (peripheral): Secondary | ICD-10-CM

## 2012-10-03 DIAGNOSIS — N200 Calculus of kidney: Secondary | ICD-10-CM

## 2012-10-03 LAB — BASIC METABOLIC PANEL
CO2: 23 mEq/L (ref 19–32)
Calcium: 9.5 mg/dL (ref 8.4–10.5)
Chloride: 102 mEq/L (ref 96–112)
Glucose, Bld: 111 mg/dL — ABNORMAL HIGH (ref 70–99)
Potassium: 4.9 mEq/L (ref 3.5–5.3)
Sodium: 133 mEq/L — ABNORMAL LOW (ref 135–145)

## 2012-10-03 LAB — CULTURE, BLOOD (ROUTINE X 2): Culture: NO GROWTH

## 2012-10-03 MED ORDER — SULFAMETHOXAZOLE-TRIMETHOPRIM 800-160 MG PO TABS: 1.0000 | ORAL_TABLET | Freq: Two times a day (BID) | ORAL | Status: DC

## 2012-10-03 MED ORDER — HYDROCODONE-ACETAMINOPHEN 5-325 MG PO TABS: 1.0000 | ORAL_TABLET | Freq: Three times a day (TID) | ORAL | Status: DC | PRN

## 2012-10-03 NOTE — Patient Instructions (Addendum)
Follow up in 2 weeks so we can get you another course of antibiotics and make sure you are doing ok.   Do not take lisinopril or metformin for now.   Take antibiotics everyday. Try to take strong pain medicine only once daily and otherwise use tramadol.  I am going to try to reach your urologist.   MAKE SURE TO FIGURE THINGS OUT SOON ABOUT INSURANCE! We need to get you on orange card if you dont get the subsidy. This still may not cover urology but we still need to have the orange card if we can so your bills are less.   Increase morning insulin to 14 units.   See you in 2 weeks,  Dr. Durene Cal

## 2012-10-03 NOTE — Assessment & Plan Note (Addendum)
Continue chronic bactrim until stone removed. Follow up in 2 weeks. Precepted Case with Dr. Lum Babe Will send epic message to Dr. Brunilda Payor as unclear if we should continue chronic antibiotics if she cannot get in to see him.  Interesting that most pain is on right when stone is on left.

## 2012-10-03 NOTE — Assessment & Plan Note (Signed)
Well controlled. Due to history of AKI, will hold off on lisinopril for now until stone removed.  Continue metoprolol 12.5mg  BID.

## 2012-10-03 NOTE — Assessment & Plan Note (Signed)
a1c up to 7.7. Will hold off on adding back metformin due to risk AKI until stone removed.

## 2012-10-03 NOTE — Assessment & Plan Note (Addendum)
Mild hydronephrosis due to stone. Was supposed to follow up with Dr. Brunilda Payor but has no insurance. Will contact Dr. Brunilda Payor to see if there are any options.  Patient to return in 2 weeks. Continue on Bactrim for now.  Rx for 30 norco given for pain.

## 2012-10-03 NOTE — Assessment & Plan Note (Signed)
Check bmet today. Avoid ace-i and metformin for now.

## 2012-10-03 NOTE — Progress Notes (Signed)
Subjective:   1. Hospital follow up for severe sepsis (required almost 6L to maintain BP) and AKI likely due to urinary source  and likely pyelonephritis with obstructing stone in L renal pelvis with mild hydronephrosis.   Patient has been compliant with bactrim. Urine Culture with multiple bacterial morphotypes. Blood cultures showed no growth final. Patient was supposed to follow up with Dr. Brunilda Payor but she has no insurance. Coverage gap area as she does not qualify for Medicaid of subsidy.   She is to take this until stone removed.Tramadol has done little for low back pain (right sided some but also radiates over to left side but not necessarily in CVA. Afebrile. No overall weakness.   2. Hypertension- BP Readings from Last 3 Encounters:  10/03/12 110/64  09/28/12 130/49  09/21/12 110/74  Home BP monitoring-no Compliant with medications-yes without side effects. Also takes ASA and statin.  Denies any CP, HA, SOB, blurry vision, LE edema, transient weakness, orthopnea, PND.   3.  DIABETES-last a1c 7.7 in hospital Medications taking and tolerating-yes including insulin 12 units in AM and 10 in PM.  Blood Sugars per patient-checking twice per day. fasting- 120-150. Nighttime 180 average.  Hypoglycemia symptoms (shaky, sweaty, hungry,  anxious, tremor, palpitations, confusion, behavior change)-no  Last eye exam-10 years ago, no insurance currently.   Last foot exam-11/18/11 Last microalbumin/on ace inhibitor-on ace Daily foot monitoring-not regular  ROS- (-)Polyuria, (-) Polydipsia,  (+ but unchanged)nocturia, (-) Vision changes, (-) feet or hand numbness/pain/tingling,   ROS--See HPI  Past Medical History Patient Active Problem List  Diagnosis  . DEPRESSION, MAJOR, RECURRENT  . DISORDER, BIPOLAR NOS  . TOBACCO DEPENDENCE  . HYPERTENSION, BENIGN SYSTEMIC  . VENOUS INSUFFICIENCY, CHRONIC  . DMII (diabetes mellitus, type 2)  . Hyperlipidemia  . Right knee pain  . Obesity, Class III,  BMI 40-49.9 (morbid obesity)  . History AKI (acute kidney injury)  . History of Pyelonephritis  . History of Urosepsis   Reviewed problem list.  Medications- reviewed and updated Chief complaint-noted  Objective: BP 110/64  Pulse 95  Temp(Src) 98.1 F (36.7 C) (Oral)  Ht 5\' 3"  (1.6 m)  Wt 278 lb (126.1 kg)  BMI 49.26 kg/m2 Gen: NAD, resting comfortably in chair CV: RRR no murmurs rubs or gallops Lungs: CTAB no crackles, wheeze, rhonchi MSK: mod-severe tenderness to palpation in Right CVA, very slight pain in L CVA Skin: warm, dry Neuro: grossly normal, moves all extremities Ext: trace edema bilaterally  Assessment/Plan:

## 2012-10-10 ENCOUNTER — Telehealth: Payer: Self-pay | Admitting: Family Medicine

## 2012-10-10 NOTE — Telephone Encounter (Signed)
LVM for patient to return call  Needs following message given: Patient's kidney function slightly worsened after leaving hospital. Needs to drink a lot of water (at least 8 glasses 8 oz per day) until follow up. Hopefully, this will improve kidney function. We will recheck at next visit.

## 2012-10-13 NOTE — Telephone Encounter (Signed)
Message given

## 2012-10-20 ENCOUNTER — Encounter: Payer: Self-pay | Admitting: Family Medicine

## 2012-10-21 ENCOUNTER — Ambulatory Visit (INDEPENDENT_AMBULATORY_CARE_PROVIDER_SITE_OTHER): Payer: Self-pay | Admitting: Family Medicine

## 2012-10-21 ENCOUNTER — Encounter: Payer: Self-pay | Admitting: Family Medicine

## 2012-10-21 VITALS — BP 148/92 | HR 92 | Ht 64.0 in | Wt 273.0 lb

## 2012-10-21 DIAGNOSIS — N12 Tubulo-interstitial nephritis, not specified as acute or chronic: Secondary | ICD-10-CM

## 2012-10-21 DIAGNOSIS — I10 Essential (primary) hypertension: Secondary | ICD-10-CM

## 2012-10-21 DIAGNOSIS — N200 Calculus of kidney: Secondary | ICD-10-CM

## 2012-10-21 DIAGNOSIS — N179 Acute kidney failure, unspecified: Secondary | ICD-10-CM

## 2012-10-21 LAB — POCT UA - MICROSCOPIC ONLY: RBC, urine, microscopic: >20

## 2012-10-21 LAB — POCT URINALYSIS DIPSTICK
Bilirubin, UA: NEGATIVE
Glucose, UA: NEGATIVE
Ketones, UA: NEGATIVE
Nitrite, UA: POSITIVE

## 2012-10-21 LAB — BASIC METABOLIC PANEL
Calcium: 10.1 mg/dL (ref 8.4–10.5)
Chloride: 106 mEq/L (ref 96–112)
Creat: 1.12 mg/dL — ABNORMAL HIGH (ref 0.50–1.10)
Sodium: 140 mEq/L (ref 135–145)

## 2012-10-21 MED ORDER — SULFAMETHOXAZOLE-TRIMETHOPRIM 800-160 MG PO TABS: 1.0000 | ORAL_TABLET | Freq: Two times a day (BID) | ORAL | Status: DC

## 2012-10-21 MED ORDER — HYDROCODONE-ACETAMINOPHEN 5-325 MG PO TABS: 1.0000 | ORAL_TABLET | Freq: Three times a day (TID) | ORAL | Status: DC | PRN

## 2012-10-21 NOTE — Progress Notes (Signed)
Subjective:   1. Left sided kidney stone with hydronephrosis and history of pyelonephritis with subsequent sepsis recently hospitalized on 3/28. Patient has been compliant with bactrim since time of discharge (Dr. Brunilda Payor wanted her on this until stone removed). Urine Culture with multiple bacterial morphotypes on initial urine culture-did not repeat today. Blood cultures showed no growth final.   Patient was supposed to follow up with Dr. Brunilda Payor but she has no insurance. Coverage gap area as she does not qualify for Medicaid of subsidy. She is working on orange card. Discovered today that she has a bad balance at [facility] urology and will not be able to be seen there even if she gets the orange card.   Tramadol has done little for low back pain (more left sided CVA today than previous-but also has some MSK low back pain). Hydrocodone very helpful but patient using q6 hours. Some pain does shoot into both legs but no saddle anesthesia/numbnesss or tingling in legs.  Afebrile. No overall weakness. No nausea/vomiting/sickness.  2. AKI-History AKI in hospital which improved before discharge to baseline of 1. Increased to 1.5 on follow up 2 weeks ago. Holding metformin and lisinopril.   3. Hypertension-no chest pain/shortness of breath. Compliant with metoprolol only. Holding lisinopril. Does not check at home.   ROS--See HPI  Past Medical History Patient Active Problem List  Diagnosis  . DEPRESSION, MAJOR, RECURRENT  . DISORDER, BIPOLAR NOS  . TOBACCO DEPENDENCE  . HYPERTENSION, BENIGN SYSTEMIC  . VENOUS INSUFFICIENCY, CHRONIC  . DMII (diabetes mellitus, type 2)  . Hyperlipidemia  . Obesity, Class III, BMI 40-49.9 (morbid obesity)  . History AKI (acute kidney injury)  . History of Pyelonephritis  . History of Urosepsis   Reviewed problem list.  Medications- reviewed and updated Chief complaint-noted  Objective: BP 148/92  Pulse 92  Ht 5\' 4"  (1.626 m)  Wt 273 lb (123.832 kg)  BMI 46.84  kg/m2 Gen: NAD, resting comfortably in chair CV: RRR no murmurs rubs or gallops Lungs: CTAB no crackles, wheeze, rhonchi MSK: moderate tenderness to palpation in Left CVA.   Back - Normal skin, Spine with normal alignment and no deformity.  No tenderness to vertebral process palpation.  Paraspinous muscles are tender but without spasm.   Range of motion is full at neck and lumbar sacral region. Negative straight leg raise.  Skin: warm, dry Neuro: grossly normal, moves all extremities Ext: trace edema bilaterally  Color, UA  YELLOW   Clarity, UA  CLOUDY   Glucose, UA  NEG   Bilirubin, UA  NEG   Ketones, UA  NEG   Spec Grav, UA  1.025   Blood, UA  MODERATE   pH, UA  6.0   Protein, UA  100   Urobilinogen, UA  0.2   Nitrite, UA  POSITIVE   Leukocytes, UA  large (3+)    WBC, Ur, HPF, POC  LOADED   RBC, urine, microscopic  >20   Bacteria, U Microscopic  3++   Epithelial cells, urine per micros  1-5    Assessment/Plan:  To discuss diabetes and health maintenance at next visit.

## 2012-10-21 NOTE — Assessment & Plan Note (Addendum)
Refer to [facility] for removal of kidney stone which is presumed cause of urosepsis which led patient to be admitted to ICU and require 6L IV fluids. Hopeful for patient to meet with financial counselor and be able to have stone removed. Precepted case with Dr. Lum Babe with consideration of d/c antibiotics if urine unremarkable but Checked urine which had 3+ bacteria, blood, and loaded with white cells. Sent for culture. Will continue chronic bactrim for now and if resistance has emerged, will plan on alternate therapy.   Given #20 Norco. No planned refills before 11/13/12. Patient encouraged to wean and use some tramadol. Believe MSK (arthritis) component contributing.

## 2012-10-21 NOTE — Patient Instructions (Addendum)
Follow up in 2 weeks so we can get you another course of antibiotics and make sure you are doing ok. I am going to check your urine and see how things look and check a culture.  Continue to not take lisinopril or metformin for now.  I gave you a refill on your medicine but I do not plan to refill it until 4 weeks (not 2 weeks as I filled it this time). You can take tylenol if needed but dont take motrin, aleve, ibuprofen or other NSAIDS. You can also use tramadol.  I am going to try to refer you to [facility] for urology. If I am able to speak with one of their doctors, we may be able to stop the antibiotic.   MAKE SURE TO FIGURE THINGS OUT SOON ABOUT INSURANCE! We need to get you on orange card if you dont get the subsidy. This still may not cover urology but we still need to have the orange card if we can so your bills are less.   See you in 2 weeks (we will talk about your diabetes then too)  Dr. Durene Cal  Health Maintenance Due-we need to get you uptodate soon!   Topic Date Due  . Pap Smear  05/28/1980  . Tetanus/tdap  05/02/2012  . Mammogram  05/28/2012  . Colonoscopy  05/28/2012  . Influenza Vaccine  03/02/2013

## 2012-10-21 NOTE — Assessment & Plan Note (Signed)
Will tolerate slightly higher BP for now as attempting to protect kidneys while obstructive kidney stone still in place.

## 2012-10-21 NOTE — Assessment & Plan Note (Signed)
Recheck BMET today. 1.5 at last visit up from 1 baseline previously. Hopeful that it is back to baseline with holding lisinopril/metformin and increasing hydration (to help with stone and AKI)

## 2012-10-22 ENCOUNTER — Telehealth: Payer: Self-pay | Admitting: *Deleted

## 2012-10-22 ENCOUNTER — Encounter: Payer: Self-pay | Admitting: Family Medicine

## 2012-10-22 NOTE — Telephone Encounter (Signed)
Will await callback from pt. Mayjor Ager, Maryjo [place]

## 2012-10-22 NOTE — Telephone Encounter (Signed)
Message copied by Osborne Oman on Wed Oct 22, 2012 12:16 PM ------      Message from: Shelva Majestic      Created: Wed Oct 22, 2012 11:32 AM       LVM for patient to call nursing staff. Creatinine looks better (continue to not take metformin or lisinopril until stone removed). Continue antibiotics until seen by [facility]. We are still awaiting urine culture but urinalysis showed potential for infection if antibiotics not continued. ------

## 2012-10-23 LAB — URINE CULTURE: Colony Count: >=100000

## 2012-10-23 NOTE — Telephone Encounter (Signed)
Pt informed. Fleeger, Jessica Dawn  

## 2012-11-05 ENCOUNTER — Ambulatory Visit: Payer: Self-pay | Admitting: Family Medicine

## 2012-11-17 ENCOUNTER — Emergency Department (HOSPITAL_COMMUNITY)
Admission: EM | Admit: 2012-11-17 | Discharge: 2012-11-17 | Disposition: A | Discharge status: Home / Self Care | Payer: Self-pay | Attending: Emergency Medicine | Admitting: Emergency Medicine

## 2012-11-17 ENCOUNTER — Encounter (HOSPITAL_COMMUNITY): Payer: Self-pay | Admitting: Emergency Medicine

## 2012-11-17 ENCOUNTER — Emergency Department (HOSPITAL_COMMUNITY): Payer: Self-pay

## 2012-11-17 DIAGNOSIS — I10 Essential (primary) hypertension: Secondary | ICD-10-CM | POA: Insufficient documentation

## 2012-11-17 DIAGNOSIS — N390 Urinary tract infection, site not specified: Secondary | ICD-10-CM | POA: Insufficient documentation

## 2012-11-17 DIAGNOSIS — F339 Major depressive disorder, recurrent, unspecified: Secondary | ICD-10-CM | POA: Insufficient documentation

## 2012-11-17 DIAGNOSIS — Z8742 Personal history of other diseases of the female genital tract: Secondary | ICD-10-CM | POA: Insufficient documentation

## 2012-11-17 DIAGNOSIS — Z794 Long term (current) use of insulin: Secondary | ICD-10-CM | POA: Insufficient documentation

## 2012-11-17 DIAGNOSIS — E119 Type 2 diabetes mellitus without complications: Secondary | ICD-10-CM | POA: Insufficient documentation

## 2012-11-17 DIAGNOSIS — K5732 Diverticulitis of large intestine without perforation or abscess without bleeding: Secondary | ICD-10-CM | POA: Insufficient documentation

## 2012-11-17 DIAGNOSIS — Z79899 Other long term (current) drug therapy: Secondary | ICD-10-CM | POA: Insufficient documentation

## 2012-11-17 DIAGNOSIS — Z87448 Personal history of other diseases of urinary system: Secondary | ICD-10-CM | POA: Insufficient documentation

## 2012-11-17 DIAGNOSIS — F17200 Nicotine dependence, unspecified, uncomplicated: Secondary | ICD-10-CM | POA: Insufficient documentation

## 2012-11-17 DIAGNOSIS — F319 Bipolar disorder, unspecified: Secondary | ICD-10-CM | POA: Insufficient documentation

## 2012-11-17 DIAGNOSIS — K5792 Diverticulitis of intestine, part unspecified, without perforation or abscess without bleeding: Secondary | ICD-10-CM

## 2012-11-17 DIAGNOSIS — E785 Hyperlipidemia, unspecified: Secondary | ICD-10-CM | POA: Insufficient documentation

## 2012-11-17 DIAGNOSIS — Z7982 Long term (current) use of aspirin: Secondary | ICD-10-CM | POA: Insufficient documentation

## 2012-11-17 LAB — URINE MICROSCOPIC-ADD ON

## 2012-11-17 LAB — CBC WITH DIFFERENTIAL/PLATELET
Basophils Absolute: 0.2 10*3/uL — ABNORMAL HIGH (ref 0.0–0.1)
Basophils Relative: 1 % (ref 0–1)
Eosinophils Absolute: 0.2 10*3/uL (ref 0.0–0.7)
Eosinophils Relative: 1 % (ref 0–5)
HCT: 43.7 % (ref 36.0–46.0)
Hemoglobin: 15.7 g/dL — ABNORMAL HIGH (ref 12.0–15.0)
Lymphocytes Relative: 20 % (ref 12–46)
Lymphs Abs: 2.9 10*3/uL (ref 0.7–4.0)
MCH: 32.4 pg (ref 26.0–34.0)
MCHC: 35.9 g/dL (ref 30.0–36.0)
MCV: 90.3 fL (ref 78.0–100.0)
Monocytes Absolute: 1.4 10*3/uL — ABNORMAL HIGH (ref 0.1–1.0)
Monocytes Relative: 10 % (ref 3–12)
Neutro Abs: 9.8 10*3/uL — ABNORMAL HIGH (ref 1.7–7.7)
Neutrophils Relative %: 68 % (ref 43–77)
Platelets: 291 10*3/uL (ref 150–400)
RBC: 4.84 MIL/uL (ref 3.87–5.11)
RDW: 13 % (ref 11.5–15.5)
WBC: 14.4 10*3/uL — ABNORMAL HIGH (ref 4.0–10.5)

## 2012-11-17 LAB — URINALYSIS, ROUTINE W REFLEX MICROSCOPIC
Bilirubin Urine: NEGATIVE
Glucose, UA: NEGATIVE mg/dL
Ketones, ur: NEGATIVE mg/dL
Nitrite: POSITIVE — AB
Protein, ur: 30 mg/dL — AB
Specific Gravity, Urine: 1.018 (ref 1.005–1.030)
Urobilinogen, UA: 0.2 mg/dL (ref 0.0–1.0)
pH: 6 (ref 5.0–8.0)

## 2012-11-17 LAB — CG4 I-STAT (LACTIC ACID): Lactic Acid, Venous: 2.18 mmol/L (ref 0.5–2.2)

## 2012-11-17 LAB — BASIC METABOLIC PANEL
BUN: 14 mg/dL (ref 6–23)
CO2: 22 mEq/L (ref 19–32)
Calcium: 9.8 mg/dL (ref 8.4–10.5)
Chloride: 102 mEq/L (ref 96–112)
Creatinine, Ser: 0.98 mg/dL (ref 0.50–1.10)
GFR calc Af Amer: 77 mL/min — ABNORMAL LOW (ref >90)
GFR calc non Af Amer: 66 mL/min — ABNORMAL LOW (ref >90)
Glucose, Bld: 174 mg/dL — ABNORMAL HIGH (ref 70–99)
Potassium: 3.9 mEq/L (ref 3.5–5.1)
Sodium: 138 mEq/L (ref 135–145)

## 2012-11-17 MED ORDER — CIPROFLOXACIN HCL 500 MG PO TABS: 500.0000 mg | ORAL_TABLET | Freq: Once | ORAL | Status: DC

## 2012-11-17 MED ORDER — CIPROFLOXACIN IN D5W 400 MG/200ML IV SOLN: 400.0000 mg | Freq: Once | INTRAVENOUS | Status: DC

## 2012-11-17 MED ORDER — CIPROFLOXACIN HCL 500 MG PO TABS: 500.0000 mg | ORAL_TABLET | Freq: Two times a day (BID) | ORAL | Status: DC

## 2012-11-17 MED ORDER — METRONIDAZOLE 500 MG PO TABS: 500.0000 mg | ORAL_TABLET | Freq: Three times a day (TID) | ORAL | Status: DC

## 2012-11-17 MED ORDER — METRONIDAZOLE IN NACL 5-0.79 MG/ML-% IV SOLN
500.0000 mg | Freq: Once | INTRAVENOUS | Status: AC
  Administered 2012-11-17: 500 mg via INTRAVENOUS
  Filled 2012-11-17: qty 100

## 2012-11-17 MED ORDER — DEXTROSE 5 % IV SOLN
1.0000 g | Freq: Once | INTRAVENOUS | Status: AC
  Administered 2012-11-17: 1 g via INTRAVENOUS
  Filled 2012-11-17: qty 10

## 2012-11-17 MED ORDER — SULFAMETHOXAZOLE-TRIMETHOPRIM 800-160 MG PO TABS: 1.0000 | ORAL_TABLET | Freq: Two times a day (BID) | ORAL | Status: DC

## 2012-11-17 MED ORDER — ONDANSETRON HCL 4 MG/2ML IJ SOLN
4.0000 mg | Freq: Once | INTRAMUSCULAR | Status: AC
  Administered 2012-11-17: 4 mg via INTRAVENOUS
  Filled 2012-11-17: qty 2

## 2012-11-17 MED ORDER — MORPHINE SULFATE 4 MG/ML IJ SOLN
6.0000 mg | Freq: Once | INTRAMUSCULAR | Status: AC
  Administered 2012-11-17: 6 mg via INTRAVENOUS
  Filled 2012-11-17: qty 2

## 2012-11-17 NOTE — ED Notes (Signed)
Results given to Juleen China, MD about lactic acid.

## 2012-11-17 NOTE — ED Notes (Signed)
Results of lactic acid called to primary nurse Amy

## 2012-11-17 NOTE — ED Provider Notes (Signed)
History    52 year old female with abdominal cramping. Onset yesterday. Intermittent. Pain is across lower abdomen and also some cramping sensations in her lower back. Patient has this sensation while urinating, but has also noticed it intermittently when not urinating. No fevers or chills. No nausea or vomiting. No unusual vaginal bleeding or discharge. No hematuria. No intervention prior to arrival.  CSN: [id number]  Arrival date & time 11/17/12  0806   First MD Initiated Contact with Patient 11/17/12 0810      No chief complaint on file.   (Consider location/radiation/quality/duration/timing/severity/associated sxs/prior treatment) HPI  Past Medical History  Diagnosis Date  . DEPRESSION, MAJOR, RECURRENT 08/29/2006  . DISORDER, BIPOLAR NOS 09/09/2006  . BACK PAIN, LOW 08/29/2006  . OVARIAN MASS 09/18/2007    Benign ovarian cyst s/p laparotomy for removal   . DMII (diabetes mellitus, type 2) 09/19/2010  . Hyperlipidemia 09/19/2010    LDL 78 on 08/2010. Recheck 08/2011.    Marland Kitchen HYPERTENSION, BENIGN SYSTEMIC 08/29/2006    Well controlled on Lopressor and lisinopril. Check BMET 12/2011    . TOBACCO DEPENDENCE 08/29/2006    Patient quit in early months of 2012. Continue to follow for possible relapse.    . Kidney infection   . Urosepsis 09/27/2012    Past Surgical History  Procedure Laterality Date  . Partial hysterectomy      unsure if had cervix removed.     No family history on file.  History  Substance Use Topics  . Smoking status: Current Every Day Smoker -- 1.00 packs/day    Types: Cigarettes  . Smokeless tobacco: Not on file  . Alcohol Use: No    OB History   Grav Para Term Preterm Abortions TAB SAB Ect Mult Living                  Review of Systems  All systems reviewed and negative, other than as noted in HPI.   Allergies  Review of patient's allergies indicates no known allergies.  Home Medications   Current Outpatient Rx  Name  Route  Sig  Dispense  Refill   . aspirin 81 MG EC tablet   Oral   Take 81 mg by mouth daily.          . insulin NPH (HUMULIN N,NOVOLIN N) 100 UNIT/ML injection   Subcutaneous   Inject 10-12 Units into the skin 2 (two) times daily. 12 units before breakfast, 10 units before dinner.         . metoprolol tartrate (LOPRESSOR) 25 MG tablet   Oral   Take 0.5 tablets (12.5 mg total) by mouth 2 (two) times daily.   30 tablet   5   . Multiple Vitamin (MULTIVITAMIN WITH MINERALS) TABS   Oral   Take 1 tablet by mouth daily.         . simvastatin (ZOCOR) 20 MG tablet   Oral   Take 20 mg by mouth every evening.         . ciprofloxacin (CIPRO) 500 MG tablet   Oral   Take 1 tablet (500 mg total) by mouth every 12 (twelve) hours.   10 tablet   0     BP 128/83  Pulse 99  Temp(Src) 98.3 F (36.8 C) (Oral)  Resp 20  Ht 5\' 4"  (1.626 m)  Wt 267 lb (121.11 kg)  BMI 45.81 kg/m2  SpO2 100%  Physical Exam  Nursing note and vitals reviewed. Constitutional: She appears well-developed and well-nourished. No distress.  Laying in bed. NAD. Obese.   HENT:  Head: Normocephalic and atraumatic.  Eyes: Conjunctivae are normal. Right eye exhibits no discharge. Left eye exhibits no discharge.  Neck: Neck supple.  Cardiovascular: Regular rhythm and normal heart sounds.  Exam reveals no gallop and no friction rub.   No murmur heard. Mild tachycardia  Pulmonary/Chest: Effort normal and breath sounds normal. No respiratory distress.  Abdominal: Soft. She exhibits no distension. There is no tenderness.  Genitourinary:  No cva tenderness  Musculoskeletal: She exhibits no edema and no tenderness.  Neurological: She is alert.  Skin: Skin is warm and dry.  Psychiatric: She has a normal mood and affect. Her behavior is normal. Thought content normal.    ED Course  Procedures (including critical care time)  Labs Reviewed  URINALYSIS, ROUTINE W REFLEX MICROSCOPIC - Abnormal; Notable for the following:    APPearance  TURBID (*)    Hgb urine dipstick LARGE (*)    Protein, ur 30 (*)    Nitrite POSITIVE (*)    Leukocytes, UA LARGE (*)    All other components within normal limits  URINE MICROSCOPIC-ADD ON - Abnormal; Notable for the following:    Bacteria, UA MANY (*)    All other components within normal limits  CBC WITH DIFFERENTIAL - Abnormal; Notable for the following:    WBC 14.4 (*)    Hemoglobin 15.7 (*)    Neutro Abs 9.8 (*)    Monocytes Absolute 1.4 (*)    Basophils Absolute 0.2 (*)    All other components within normal limits  BASIC METABOLIC PANEL - Abnormal; Notable for the following:    Glucose, Bld 174 (*)    GFR calc non Af Amer 66 (*)    GFR calc Af Amer 77 (*)    All other components within normal limits  URINE CULTURE  CG4 I-STAT (LACTIC ACID)   Ct Abdomen Pelvis Wo Contrast  11/17/2012   *RADIOLOGY REPORT*  Clinical Data: Abdominal pain, UTI  CT ABDOMEN AND PELVIS WITHOUT CONTRAST  Technique:  Multidetector CT imaging of the abdomen and pelvis was performed following the standard protocol without intravenous contrast.  Comparison: 09/27/2012  Findings: Lung bases are unremarkable.  Degenerative changes thoracolumbar spine.  Unenhanced liver is unremarkable.  The patient is status post cholecystectomy.  Unenhanced pancreas, spleen and adrenals are unremarkable.  Again noted bilateral nephrolithiasis.  Multiple large calculi are noted again within the right kidney the largest in mid pole measures 2 cm.  Again noted a calculus in the left renal pelvis measures 1.5 cm.  Mild left hydronephrosis.  A  low density lesion probable complex cyst in lower pole of the right kidney measures 1.9 cm.  A cortical lesion axial image 43 mid pole posterior aspect of the right kidney measures 1.9 cm.  This cannot be characterized without IV contrast.  There is air within the right renal collecting system.  No small bowel obstruction.  Atherosclerotic calcifications of the abdominal aorta and the iliac  arteries are noted.  Normal appendix is clearly visualized axial image 63.  No pericecal inflammation.  Axial image 77 there is a inflamed sigmoid colon diverticulum consistent with acute diverticulitis.  There is air within urinary bladder.  Clinical correlation is necessary to exclude a gas forming infection UTI, right xantho granulomatous pyelonephritis, fistula tract between the colon and bladder or recent urology instrumentation.  There is a right ovarian cyst measures 2 cm.  IMPRESSION:  1.  Again noted bilateral nephrolithiasis.  There  is significant air within the right renal collecting system.  Air noted within urinary bladder.  Findings may be due to pyelonephritis,  UTI with gas-forming bacteria, recent instrumentation or fistula tract between the sigmoid colon and urinary bladder. 2.  Inflamed sigmoid colon diverticulum is noted consistent with acute diverticulitis. 3.  Normal appendix.  No pericecal inflammation. 4.  No small bowel obstruction.  Critical findings discussed with Dr. Juleen China.   Original Report Authenticated By: Natasha Mead, M.D.     1. UTI (urinary tract infection)       MDM  52 year old female with urinary tract infection. Patient did not initially disclose significant urological history. Patient with a recent admission for sepsis/pyelonephritis.  Renal ultrasound then showed moderate left hydronephrosis and CT w/ bilateral renal calculi. The largest one in the right renal pelvis measures 3.0 x 1.1 cm and 1.6 x 1.1cm calculus in the left renal pelvis with mild hydronephrosis. Pt w/ previous right percutaneous nephrolithotomy in January 2009 by Dr Retta Diones, [facility]. The left renal calculus has not significantly increased in size since 2011.There are significant changes in the number and size of the right renal stones.  She followed up with urology at [facility] after this most recent admission. It sounds like they plan to do a staged nephrostomy, with initial  procedure later this month. Patient was previously on antibiotics. It sounds like she was on Bactrim. Stopped last week. This certainly changes plan of care. Patient is afebrile and well appearing. She was tachycardic on arrival though. Will obtain IV access. CBC and BMP to assess renal function. CT scan. Will discuss with urology at [facility].  12:46 PM CT concerning for findings per above. Cipro/flagyl for uti/pyelo/diverticulitis. Pt was not catheterized for her urine specimen. Will discuss with urology at [facility], unfortunately pt cannot remember who she specifically saw. Presume associated with teaching service given insurance/financial issues.    2:00 PM Discussed with Dr Ronne Binning, urology, at [facility]. Reports recent ureteroscopy and most likely explanation for air. Recommending continued abx until she is re-evaluated by them.     Raeford Razor, MD 11/18/12 [phone]

## 2012-11-17 NOTE — ED Notes (Signed)
Patient states she hurts when she urinates.   Patient states her back hurts, lower abdomen hurts.

## 2012-11-19 ENCOUNTER — Encounter: Payer: Self-pay | Admitting: Family Medicine

## 2012-11-19 ENCOUNTER — Ambulatory Visit (INDEPENDENT_AMBULATORY_CARE_PROVIDER_SITE_OTHER): Payer: 59 | Admitting: Family Medicine

## 2012-11-19 VITALS — BP 123/80 | HR 94 | Temp 97.8°F | Ht 64.0 in | Wt 273.0 lb

## 2012-11-19 DIAGNOSIS — N12 Tubulo-interstitial nephritis, not specified as acute or chronic: Secondary | ICD-10-CM

## 2012-11-19 DIAGNOSIS — N200 Calculus of kidney: Secondary | ICD-10-CM

## 2012-11-19 DIAGNOSIS — K5732 Diverticulitis of large intestine without perforation or abscess without bleeding: Secondary | ICD-10-CM

## 2012-11-19 DIAGNOSIS — E119 Type 2 diabetes mellitus without complications: Secondary | ICD-10-CM

## 2012-11-19 DIAGNOSIS — N179 Acute kidney failure, unspecified: Secondary | ICD-10-CM

## 2012-11-19 LAB — URINE CULTURE: Colony Count: >=100000

## 2012-11-19 MED ORDER — TRAMADOL HCL 50 MG PO TABS: 50.0000 mg | ORAL_TABLET | Freq: Three times a day (TID) | ORAL | Status: DC | PRN

## 2012-11-19 MED ORDER — SULFAMETHOXAZOLE-TRIMETHOPRIM 800-160 MG PO TABS: 1.0000 | ORAL_TABLET | Freq: Two times a day (BID) | ORAL | Status: DC

## 2012-11-19 NOTE — Progress Notes (Signed)
Subjective:   # Left sided kidney stone with hydronephrosis and history of pyelonephritis with subsequent sepsis recently hospitalized on 3/28. Patient had been compliant with bactrim since time of discharge (Dr. Brunilda Payor wanted her on this until stone removed) but unfortunately patient was unable to get back into [facility] due to a bad balance history so we referred to [facility]. Patient reports to me that she has a staged procedure to remove kidney stones on right first (since that is the side that stones have increased) on 5/29 and will later have stones on left removed. Pain has been moderately well controlled on hydrocodone previously and slightly so on tramadol. Urine culture on 5/19 showed E. Coli. Appears patient was on keflex from [facility] but she reports this is not true and states has been taking no antibiotics until this weekend (see below)  Creatinine in ED decreased back to baseline of 1 while being off ace inhibitor and metformin. ROS-no radiation of pain into legs, no dysuria/nausea/vomiting/fever/chills. Does have some polyuria and nocturia throughout the last 2 months.   # Diverticulitis unfortunately patient developed a new LLQ abdominal pain over the weekend and went to ED and was diagnosed with diverticulitis on top of her kidney stone issues. She has been compliant with bactrim up until about a week ago. CT scan showed diverticulitis so she was started on cipro/flagyl in hopes that would cover chronic UTI as well. Pain only mildly improved since starting antibiotics.   # DIABETES Type II Medications taking and tolerating-insulin novolog 70/30 11 units in AM and 12 in PM. No side effects. Off of metformin due to AKI Blood Sugars per patient-Fasting 100-110. Does not check it otherwise.  Regular Exercise-no because of pain  Last eye exam-10 years ago, states insurance doesn't cover. Offerred retinal scan in office but $98 was cost prohibitive Last foot exam-today 5/21 Last  microalbumin/on ace inhibitor-defer until over UTI Daily foot monitoring-encouraged to start today once again Pneumovax-11/08/11 On Aspirin-holding for procedure but regularly yes On statin-yes  ROS- occasional Polyuria,Polydipsia, nocturia but no recent increase. Denies Vision changes, feet or hand numbness/pain/tingling. Denies hypoglycemia symptoms (shaky, sweaty, hungry,  anxious, tremor, palpitations, confusion, behavior change).   Hemoglobin a1c:  Lab Results  Component Value Date   HGBA1C 7.7* 09/27/2012   HGBA1C 5.9 11/08/2011   HGBA1C 5.7 03/19/2011    Health Maintenance Due  Topic Date Due  . Foot Exam  05/28/1972  . Ophthalmology Exam  05/28/1972  . Urine Microalbumin  05/28/1972  . Pap Smear  12/24/2010  . Tetanus/tdap  05/02/2012  . Mammogram  05/28/2012  . Colonoscopy  05/28/2012    ROS--See HPI  Past Medical History Patient Active Problem List  Diagnosis  . DEPRESSION, MAJOR, RECURRENT  . DISORDER, BIPOLAR NOS  . TOBACCO DEPENDENCE-quit 10/2011-remove from list if patient still smoking at next visit  . HYPERTENSION, BENIGN SYSTEMIC  . VENOUS INSUFFICIENCY, CHRONIC  . DMII (diabetes mellitus, type 2)  . Hyperlipidemia  . Obesity, Class III, BMI 40-49.9 (morbid obesity)  . History AKI (acute kidney injury)  . History of Pyelonephritis  . History of Urosepsis   Reviewed problem list.  Medications- reviewed and updated Chief complaint-noted  Objective: BP 123/80  Pulse 94  Temp(Src) 97.8 F (36.6 C) (Oral)  Ht 5\' 4"  (1.626 m)  Wt 273 lb (123.832 kg)  BMI 46.84 kg/m2 Gen: NAD, resting comfortably in chair CV: RRR no murmurs rubs or gallops Lungs: CTAB no crackles, wheeze, rhonchi MSK: moderate tenderness  to palpation in Right CVA as well as some lumbar paraspinous muscles.   Back - Normal skin, Spine with normal alignment and no deformity.  No tenderness to vertebral process palpation.  Range of motion is full at neck and lumbar sacral region.  Skin:  warm, dry Neuro: grossly normal, moves all extremities Ext: trace edema bilaterally  DM foot exam performed-no deformities, 1+ pulses bilaterally, monofilament testing wnl  Assessment/Plan:  To discuss non-DM health maintenance at next visit along with f/u DM and obtain a1c.   Diverticulitis- continue current antibiotics, hopeful LLQ will improve over coming days and patient knows to return if it does not.

## 2012-11-19 NOTE — Patient Instructions (Addendum)
1. Get your mamogram. 2. In 3 months, we will do pap and stool cards 3. For diabetes, let's follow up in 3 months and get another a1c. From your fasting #s I think we should keep things the same regimen.  4. Take bactrim in addition to other 2 antibiotics. Remember reasons to come back-fever/chills/nausea/vomiting/feeling poorly before your procedure.  5. You got your eye exam today.   See you in 2 months or sooner if you would like,  Dr. Durene Cal  Health Maintenance Due  Topic Date Due  . Foot Exam  05/28/1972  . Ophthalmology Exam  05/28/1972  . Urine Microalbumin  05/28/1972  . Pap Smear  12/24/2010  . Tetanus/tdap  05/02/2012  . Mammogram  05/28/2012  . Colonoscopy  05/28/2012

## 2012-11-20 NOTE — Assessment & Plan Note (Signed)
Urine culture shows e coli resistant to cipro. Will start bactrim as well which it is sensitive to.  Continue cipro/flagyl though for diverticulitis. [facility] to remove stones which are preventing clearance of bacteria.

## 2012-11-20 NOTE — Assessment & Plan Note (Signed)
Cr back to baseline at 1. Given planned procedures, continue to hold ace-i and metformin for now.

## 2012-11-20 NOTE — Assessment & Plan Note (Addendum)
Well controlled per report. Encouraged patietn to follow prescribed regimen of 12 units 70/30 in AM and 10 units in PM.  Off of metformin due to recent AKI and plan for intervention for kidney stones. Per patient report, fasting CBGs well controlled. I suspect A1c will be elevated from 7.7 at next check and hopeful can restart metformin at that time. Hopeful we will also be able to restart ace-i so I will hold off on microalbumin for now.

## 2012-11-20 NOTE — Assessment & Plan Note (Addendum)
Procedure planned 5/29 for right side with later plans for left (pain more intense on right typically). Excited for patient that she has an end in site and very thankful for [facility] care of patient.

## 2012-11-27 HISTORY — PX: PERCUTANEOUS NEPHROSTOLITHOTOMY: SHX2207

## 2013-01-07 ENCOUNTER — Ambulatory Visit (INDEPENDENT_AMBULATORY_CARE_PROVIDER_SITE_OTHER): Payer: Self-pay | Admitting: Family Medicine

## 2013-01-07 ENCOUNTER — Encounter: Payer: Self-pay | Admitting: Family Medicine

## 2013-01-07 VITALS — BP 144/81 | HR 100 | Temp 98.2°F | Ht 64.0 in | Wt 266.0 lb

## 2013-01-07 DIAGNOSIS — I10 Essential (primary) hypertension: Secondary | ICD-10-CM

## 2013-01-07 DIAGNOSIS — N12 Tubulo-interstitial nephritis, not specified as acute or chronic: Secondary | ICD-10-CM

## 2013-01-07 DIAGNOSIS — E785 Hyperlipidemia, unspecified: Secondary | ICD-10-CM

## 2013-01-07 DIAGNOSIS — E119 Type 2 diabetes mellitus without complications: Secondary | ICD-10-CM

## 2013-01-07 LAB — POCT GLYCOSYLATED HEMOGLOBIN (HGB A1C): Hemoglobin A1C: 6.5

## 2013-01-07 LAB — BASIC METABOLIC PANEL
CO2: 24 mEq/L (ref 19–32)
Calcium: 9.3 mg/dL (ref 8.4–10.5)
Creat: 1.11 mg/dL — ABNORMAL HIGH (ref 0.50–1.10)

## 2013-01-07 NOTE — Patient Instructions (Addendum)
1. We will get your labwork today. I will restart metformin and lisinopril if able based off of your labs. I will call or send later.  2. Your a1c was 6.5 which is great. i am shocked honestly considering you are not taking the insulin i thought you were taking.   Remember to see me every 3 months or sooner if needed, Dr. Durene Cal

## 2013-01-07 NOTE — Progress Notes (Signed)
  [name] Tana Conch, MD Phone: [phone]  Subjective:   # Left sided kidney stone with hydronephrosis and history of pyelonephritis with subsequent sepsis hospitalized on 3/28.  Patient seen at [facility] and is now s/p bilateral ureteroscopic stone maniupation with bilateral double J ureteral stents placed on 6/12. Seen in ED at [facility] on 12/24/12 and found to have sepsis due to E coli pylenephritis due to the recent urological intervention. Patient was hydrated after initially ntoed to be hypotensive, treated with rocephin initially then sent home on Bactrim based off of sensitivities.   Patient presents for follow up. She has continued her flomax and completed her antibiotic. Stents are still in place. Plan for removal in 1 week. Appointment on 7/16. No fevers/chills/nasuea/vomiting/flank pain. Patient thrilled to be doing so much better.  No mention of Creatinine elvation in d/c summary. I have placed for these records to be scanned in.   # DIABETES Type II  Medications taking and tolerating-not taking any medicine because cant afford insulin. Has not been taking in a year despite telling us she had been previously.  Blood Sugars per patient-Fasting not checking at all.   Diet-reducing soda intake (mainly water occasional soda), poor food choices at times Regular Exercise-no because of recent illness.   Health Maintenance Due  Topic Date Due  . Ophthalmology Exam -[phone] retinal exam is cost prohibitive 05/28/1972  . Urine Microalbumin -recheck Cr and if stable restart ace-i 05/28/1972  . Pap Smear -cost probibative 12/24/2010  . Tetanus/tdap -cost prohibitative 05/02/2012  . Mammogram  -cost prohibitative 05/28/2012  . Colonoscopy  -cost prohibitative 05/28/2012  On Aspirin-yes On statin-yes, simvastatin Daily foot monitoring-yes, encouraged to start today once again  ROS- Denies Polyuria,Polydipsia, nocturia, Vision changes, feet or hand  numbness/pain/tingling. Denies  Hypoglycemia symptoms (shaky, sweaty, hungry,  anxious, tremor, palpitations, confusion, behavior change).   Hemoglobin a1c:  Lab Results  Component Value Date   HGBA1C 6.5 01/07/2013   HGBA1C 7.7* 09/27/2012   HGBA1C 5.9 11/08/2011   # Hypertension/hyperlpidemia BP Readings from Last 3 Encounters:  01/07/13 144/81  11/19/12 123/80  11/17/12 108/63  Home BP monitoring-no Compliant with medications-reportedly taking metoprolol and simvastatin. Off lisinopril due to history AKI.  Denies any CP, HA, SOB, blurry vision, LE edema, transient weakness, orthopnea, PND.       ROS--See HPI  Past Medical History Patient Active Problem List  Diagnosis  . DEPRESSION, MAJOR, RECURRENT  . DISORDER, BIPOLAR NOS  . TOBACCO DEPENDENCE-quit 10/2011-remove from list if patient still smoking at next visit  . HYPERTENSION, BENIGN SYSTEMIC  . VENOUS INSUFFICIENCY, CHRONIC  . DMII (diabetes mellitus, type 2)  . Hyperlipidemia  . Obesity, Class III, BMI 40-49.9 (morbid obesity)  . History AKI (acute kidney injury)  . History of Pyelonephritis  . History of Urosepsis   Reviewed problem list.  Medications- reviewed and updated Chief complaint-noted  Objective: BP 144/81  Pulse 100  Temp(Src) 98.2 F (36.8 C) (Oral)  Ht 5\' 4"  (1.626 m)  Wt 266 lb (120.657 kg)  BMI 45.64 kg/m2 Gen: NAD, resting comfortably on table CV: RRR no murmurs rubs or gallops Lungs: CTAB no crackles, wheeze, rhonchi MSK: no CVA tenderness or lumbar parpaspinous muscle tenderness.   Skin: warm, dry Neuro: grossly normal, moves all extremities Ext: trace edema bilaterally  Assessment/Plan:

## 2013-01-08 ENCOUNTER — Other Ambulatory Visit: Payer: Self-pay

## 2013-01-08 ENCOUNTER — Telehealth: Payer: Self-pay | Admitting: Family Medicine

## 2013-01-08 DIAGNOSIS — I10 Essential (primary) hypertension: Secondary | ICD-10-CM

## 2013-01-08 DIAGNOSIS — E119 Type 2 diabetes mellitus without complications: Secondary | ICD-10-CM

## 2013-01-08 MED ORDER — LISINOPRIL 10 MG PO TABS: 10.0000 mg | ORAL_TABLET | Freq: Every day | ORAL | Status: DC

## 2013-01-08 NOTE — Assessment & Plan Note (Signed)
Will restart lisinopril. Will not check Cr in 2 weeks due to no history of elevation when started previously. Follow up in 3 months.

## 2013-01-08 NOTE — Assessment & Plan Note (Signed)
Well controlled off metformin and insulin. Possibly due to poor PO while sick. Will restart lisinopril for Bp and renal protection. Consider restarting metformin in 3 months if Cr still stable.

## 2013-01-08 NOTE — Assessment & Plan Note (Signed)
Sepsis has resolved after urological procedure. Patient has finished antibiotics. She will have stents removed soon and hopeful for removal of stones at that time.

## 2013-01-08 NOTE — Telephone Encounter (Addendum)
LVM for patient to return call.   Kidney function is close to normal but since she has had recurrent infections and elevations in the levels in past. I want to start her back on lisinopril only. When we see her in 3 months, can consider adding metformin if things are still stable.   Cholesterol was also elevated and I am concerned patient is not taking simvastatin at all as previously well controlled. Please have patient tell me whether she is taking it, whether she needs refill, and if she would take it if given refill.

## 2013-01-08 NOTE — Telephone Encounter (Signed)
Pt is returning call JW

## 2013-01-08 NOTE — Telephone Encounter (Signed)
Pt informed of the below.  She will restart the lisinopril  When asked about the simvastatin she states that her "urologist took her off of this when he put me on antibiotics"  She thinks she was only off of these for about 2 weeks.  Pt informs me that she is not on the abx anymore.  Advised I would send a message back to MD with this info and we would call and let her know weather or not to start them again.

## 2013-01-08 NOTE — Assessment & Plan Note (Deleted)
Well controlled off metformin and insulin. Possibly due to poor PO while sick. Will restart lisinopril for Bp and renal protection. Consider restarting metformin in 3 months if Cr still stable.  

## 2013-01-08 NOTE — Addendum Note (Signed)
Addended by: Shelva Majestic on: 01/08/2013 08:54 AM   Modules accepted: Orders, Medications

## 2013-01-08 NOTE — Telephone Encounter (Signed)
Will ask staff to speak with patient once again (and I will be available tomorrow afternoon if patient needs to speak to me)   Lisinopril only for now due to history of recurrent kidney injury. We will likely add back metformin at next visit. If patient prefers, she can come for lab visit in 1 month and if still normal, will add metformin. Please let me know if I need to add this future lab.   Concerning cholesterol-did not see any information "Cholesterol was also elevated and I am concerned patient is not taking simvastatin at all as previously well controlled. Please have patient tell me whether she is taking it, whether she needs refill, and if she would take it if given refill. "

## 2013-02-16 ENCOUNTER — Other Ambulatory Visit: Payer: Self-pay | Admitting: *Deleted

## 2013-02-16 DIAGNOSIS — E785 Hyperlipidemia, unspecified: Secondary | ICD-10-CM

## 2013-02-16 DIAGNOSIS — I10 Essential (primary) hypertension: Secondary | ICD-10-CM

## 2013-02-16 MED ORDER — METOPROLOL TARTRATE 25 MG PO TABS: 12.5000 mg | ORAL_TABLET | Freq: Two times a day (BID) | ORAL | Status: DC

## 2013-05-22 ENCOUNTER — Emergency Department (HOSPITAL_COMMUNITY)
Admission: EM | Admit: 2013-05-22 | Discharge: 2013-05-22 | Disposition: A | Discharge status: Home / Self Care | Payer: Self-pay | Attending: Emergency Medicine | Admitting: Emergency Medicine

## 2013-05-22 ENCOUNTER — Emergency Department (HOSPITAL_COMMUNITY): Payer: Self-pay

## 2013-05-22 ENCOUNTER — Encounter (HOSPITAL_COMMUNITY): Payer: Self-pay | Admitting: Emergency Medicine

## 2013-05-22 DIAGNOSIS — R109 Unspecified abdominal pain: Secondary | ICD-10-CM | POA: Insufficient documentation

## 2013-05-22 DIAGNOSIS — Z8659 Personal history of other mental and behavioral disorders: Secondary | ICD-10-CM | POA: Insufficient documentation

## 2013-05-22 DIAGNOSIS — N200 Calculus of kidney: Secondary | ICD-10-CM | POA: Insufficient documentation

## 2013-05-22 DIAGNOSIS — Z7982 Long term (current) use of aspirin: Secondary | ICD-10-CM | POA: Insufficient documentation

## 2013-05-22 DIAGNOSIS — F17200 Nicotine dependence, unspecified, uncomplicated: Secondary | ICD-10-CM | POA: Insufficient documentation

## 2013-05-22 DIAGNOSIS — Z8742 Personal history of other diseases of the female genital tract: Secondary | ICD-10-CM | POA: Insufficient documentation

## 2013-05-22 DIAGNOSIS — R5381 Other malaise: Secondary | ICD-10-CM | POA: Insufficient documentation

## 2013-05-22 DIAGNOSIS — N390 Urinary tract infection, site not specified: Secondary | ICD-10-CM | POA: Insufficient documentation

## 2013-05-22 DIAGNOSIS — I10 Essential (primary) hypertension: Secondary | ICD-10-CM | POA: Insufficient documentation

## 2013-05-22 DIAGNOSIS — E119 Type 2 diabetes mellitus without complications: Secondary | ICD-10-CM | POA: Insufficient documentation

## 2013-05-22 DIAGNOSIS — Z79899 Other long term (current) drug therapy: Secondary | ICD-10-CM | POA: Insufficient documentation

## 2013-05-22 LAB — COMPREHENSIVE METABOLIC PANEL
ALT: 20 U/L (ref 0–35)
Albumin: 3.5 g/dL (ref 3.5–5.2)
Alkaline Phosphatase: 107 U/L (ref 39–117)
BUN: 13 mg/dL (ref 6–23)
Chloride: 101 mEq/L (ref 96–112)
GFR calc Af Amer: 67 mL/min — ABNORMAL LOW (ref >90)
Glucose, Bld: 113 mg/dL — ABNORMAL HIGH (ref 70–99)
Potassium: 3.8 mEq/L (ref 3.5–5.1)
Sodium: 137 mEq/L (ref 135–145)
Total Bilirubin: 0.4 mg/dL (ref 0.3–1.2)
Total Protein: 8.2 g/dL (ref 6.0–8.3)

## 2013-05-22 LAB — CBC WITH DIFFERENTIAL/PLATELET
Basophils Absolute: 0.1 10*3/uL (ref 0.0–0.1)
Eosinophils Absolute: 0.2 10*3/uL (ref 0.0–0.7)
Lymphocytes Relative: 23 % (ref 12–46)
Lymphs Abs: 3 10*3/uL (ref 0.7–4.0)
MCH: 33 pg (ref 26.0–34.0)
MCHC: 35.1 g/dL (ref 30.0–36.0)
Monocytes Absolute: 1.2 10*3/uL — ABNORMAL HIGH (ref 0.1–1.0)
Monocytes Relative: 9 % (ref 3–12)
Neutro Abs: 8.8 10*3/uL — ABNORMAL HIGH (ref 1.7–7.7)
Neutrophils Relative %: 66 % (ref 43–77)
Platelets: 332 10*3/uL (ref 150–400)
RBC: 4.42 MIL/uL (ref 3.87–5.11)
WBC: 13.4 10*3/uL — ABNORMAL HIGH (ref 4.0–10.5)

## 2013-05-22 LAB — URINALYSIS, ROUTINE W REFLEX MICROSCOPIC
Bilirubin Urine: NEGATIVE
Glucose, UA: NEGATIVE mg/dL
Ketones, ur: NEGATIVE mg/dL
Specific Gravity, Urine: 1.013 (ref 1.005–1.030)
pH: 6.5 (ref 5.0–8.0)

## 2013-05-22 LAB — URINE MICROSCOPIC-ADD ON

## 2013-05-22 MED ORDER — OXYCODONE-ACETAMINOPHEN 5-325 MG PO TABS: 2.0000 | ORAL_TABLET | ORAL | Status: DC | PRN

## 2013-05-22 MED ORDER — CEFPODOXIME PROXETIL 200 MG PO TABS: 200.0000 mg | ORAL_TABLET | Freq: Two times a day (BID) | ORAL | Status: DC

## 2013-05-22 MED ORDER — HYDROMORPHONE HCL PF 1 MG/ML IJ SOLN
1.0000 mg | Freq: Once | INTRAMUSCULAR | Status: AC
  Administered 2013-05-22: 1 mg via INTRAVENOUS
  Filled 2013-05-22: qty 1

## 2013-05-22 MED ORDER — DEXTROSE 5 % IV SOLN
1.0000 g | Freq: Once | INTRAVENOUS | Status: AC
  Administered 2013-05-22: 1 g via INTRAVENOUS
  Filled 2013-05-22: qty 10

## 2013-05-22 NOTE — ED Notes (Signed)
[place], MD at bedside.  

## 2013-05-22 NOTE — ED Notes (Signed)
Pt with history of kidney stones states pain to R flank x 4 days and fever and chills x 2 days (temp of 102 last night).  Pt presently afebrile, but c/o nausea.

## 2013-05-22 NOTE — ED Provider Notes (Signed)
CSN: [id number]     Arrival date & time 05/22/13  1603 History   First MD Initiated Contact with Patient 05/22/13 1846     Chief Complaint  Patient presents with  . Flank Pain  . Dysuria   Patient is a 52 y.o. female presenting with flank pain and dysuria. The history is provided by the patient.  Flank Pain This is a recurrent problem. The current episode started more than 2 days ago. The problem occurs constantly. The problem has been gradually worsening. Associated symptoms include abdominal pain. Pertinent negatives include no chest pain and no shortness of breath. Nothing aggravates the symptoms. Nothing relieves the symptoms. She has tried rest for the symptoms. The treatment provided no relief.  Dysuria Associated symptoms: abdominal pain, fever and flank pain    Pt with right flank pain for over 2 days, similar to prior kidney stones  Past Medical History  Diagnosis Date  . DEPRESSION, MAJOR, RECURRENT 08/29/2006  . DISORDER, BIPOLAR NOS 09/09/2006  . BACK PAIN, LOW 08/29/2006  . OVARIAN MASS 09/18/2007    Benign ovarian cyst s/p laparotomy for removal   . DMII (diabetes mellitus, type 2) 09/19/2010  . Hyperlipidemia 09/19/2010    LDL 78 on 08/2010. Recheck 08/2011.    Marland Kitchen HYPERTENSION, BENIGN SYSTEMIC 08/29/2006    Well controlled on Lopressor and lisinopril. Check BMET 12/2011    . TOBACCO DEPENDENCE 08/29/2006    Patient quit in early months of 2012. Continue to follow for possible relapse.    . Kidney infection   . Urosepsis 09/27/2012   Past Surgical History  Procedure Laterality Date  . Partial hysterectomy      unsure if had cervix removed.    No family history on file. History  Substance Use Topics  . Smoking status: Current Every Day Smoker -- 1.00 packs/day    Types: Cigarettes  . Smokeless tobacco: Not on file  . Alcohol Use: No   OB History   Grav Para Term Preterm Abortions TAB SAB Ect Mult Living                 Review of Systems  Constitutional: Positive  for fever and fatigue.  Respiratory: Negative for cough and shortness of breath.   Cardiovascular: Negative for chest pain.  Gastrointestinal: Positive for abdominal pain.  Genitourinary: Positive for dysuria and flank pain.  All other systems reviewed and are negative.    Allergies  Review of patient's allergies indicates no known allergies.  Home Medications   Current Outpatient Rx  Name  Route  Sig  Dispense  Refill  . aspirin 81 MG EC tablet   Oral   Take 81 mg by mouth daily.          Marland Kitchen lisinopril (PRINIVIL,ZESTRIL) 10 MG tablet   Oral   Take 1 tablet (10 mg total) by mouth daily.   30 tablet   11   . metoprolol tartrate (LOPRESSOR) 25 MG tablet   Oral   Take 0.5 tablets (12.5 mg total) by mouth 2 (two) times daily.   30 tablet   5   . Multiple Vitamin (MULTIVITAMIN WITH MINERALS) TABS   Oral   Take 1 tablet by mouth daily.          BP 118/50  Pulse 103  Temp(Src) 97.6 F (36.4 C) (Oral)  Resp 22  SpO2 95% Physical Exam CONSTITUTIONAL: Well developed/well nourished HEAD: Normocephalic/atraumatic EYES: EOMI/PERRL ENMT: Mucous membranes moist NECK: supple no meningeal signs SPINE:entire spine nontender  CV: S1/S2 noted, no murmurs/rubs/gallops noted LUNGS: Lungs are clear to auscultation bilaterally, no apparent distress ABDOMEN: soft, nontender, no rebound or guarding JY:NWGNF cva tenderness NEURO: Pt is awake/alert, moves all extremitiesx4 EXTREMITIES: pulses normal, full ROM SKIN: warm, color normal PSYCH: no abnormalities of mood noted  ED Course  Procedures (including critical care time) Labs Review Labs Reviewed  CBC WITH DIFFERENTIAL - Abnormal; Notable for the following:    WBC 13.4 (*)    Neutro Abs 8.8 (*)    Monocytes Absolute 1.2 (*)    All other components within normal limits  COMPREHENSIVE METABOLIC PANEL - Abnormal; Notable for the following:    Glucose, Bld 113 (*)    GFR calc non Af Amer 58 (*)    GFR calc Af Amer 67 (*)     All other components within normal limits  URINALYSIS, ROUTINE W REFLEX MICROSCOPIC - Abnormal; Notable for the following:    APPearance CLOUDY (*)    Hgb urine dipstick MODERATE (*)    Nitrite POSITIVE (*)    Leukocytes, UA LARGE (*)    All other components within normal limits  URINE MICROSCOPIC-ADD ON - Abnormal; Notable for the following:    Squamous Epithelial / LPF MANY (*)    Bacteria, UA MANY (*)    All other components within normal limits  URINE CULTURE   Imaging Review Ct Abdomen Pelvis Wo Contrast  05/22/2013   CLINICAL DATA:  Dysuria and right flank pain  EXAM: CT ABDOMEN AND PELVIS WITHOUT CONTRAST  TECHNIQUE: Multidetector CT imaging of the abdomen and pelvis was performed following the standard protocol without intravenous contrast.  COMPARISON:  11/17/2012  FINDINGS: Visualized portions of the lung bases are clear.  There is significant diffuse fatty infiltration of the liver. The gallbladder is surgically absent. Spleen is normal. There are a few small splenules. The pancreas is normal. Adrenal glands are normal. Aorta is calcified but not dilated. Appendix is normal. Bowel is normal. There is no ascites. There is no significant abnormality involving bladder, uterus, or left adnexal area. On the right, there is an adnexal cyst measuring 2.7 cm by 1.9 cm. This likely arises in the right ovary.  Right kidney demonstrates numerous calculi. The largest represents a stone or complex of stones in the lower pole measuring 12 mm. Lateral midpole calcifications on the right are stable. On the left, there is a complex of calculi at the ureteral pelvic junction measuring 11 mm. There is also stone in the distal left ureter seen best on image number 74, measuring 5 mm. There is moderate dilatation of the intrarenal collecting system with mild perinephric inflammatory change. The ureter beyond the ureteropelvic junction stone is not significantly dilated.  There are no acute musculoskeletal  abnormalities.  IMPRESSION: Ureteropelvic junction stone complex on the left causes hydronephrosis proximally. A smaller stone in the distal left ureter does not cause significant proximal dilatation.  Stable right adnexal cyst likely of ovarian origin.   Electronically Signed   By: Esperanza Heir M.D.   On: 05/22/2013 20:05    EKG Interpretation   None       MDM  No diagnosis found. Nursing notes including past medical history and social history reviewed and considered in documentation Labs/vital reviewed and considered  8:22 PM Pt with long h/o nephrolithiasis with complications UTI noted, given antibiotics  8:43 PM Will call urology at [facility] Pt stable 9:06 PM D/w dr Ronne Binning with urology at [facility] If no fever and no  vomiting, place on vantn 200mg  BID and f/u next week If she has fever will need stent placement 9:39 PM Pt feels improved No fever noted She is agreeable with plan   Joya Gaskins, MD 05/22/13 2139

## 2013-05-22 NOTE — ED Notes (Signed)
MD at bedside. MD explained to pt that he can have her transferred to [facility] for continuity of care. Pt states this is what she wishes to do.

## 2013-05-22 NOTE — ED Notes (Signed)
Pt c/o rt flank pain and nausea. Pt rates pain 9/10 if she does not move. Pt states she has hx of kidney stones.

## 2013-05-24 LAB — URINE CULTURE: Colony Count: >=100000

## 2013-06-11 DIAGNOSIS — E1169 Type 2 diabetes mellitus with other specified complication: Secondary | ICD-10-CM

## 2013-07-16 DIAGNOSIS — J449 Chronic obstructive pulmonary disease, unspecified: Secondary | ICD-10-CM | POA: Insufficient documentation

## 2013-07-16 DIAGNOSIS — Z8679 Personal history of other diseases of the circulatory system: Secondary | ICD-10-CM | POA: Insufficient documentation

## 2013-07-17 ENCOUNTER — Encounter: Payer: Self-pay | Admitting: Family Medicine

## 2013-07-22 ENCOUNTER — Telehealth: Payer: Self-pay | Admitting: Home Health Services

## 2013-07-22 NOTE — Telephone Encounter (Signed)
LM for patient to schedule fu diabetes appointment with PCP-Hunter.

## 2013-10-12 ENCOUNTER — Ambulatory Visit (INDEPENDENT_AMBULATORY_CARE_PROVIDER_SITE_OTHER): Payer: Self-pay | Admitting: Family Medicine

## 2013-10-12 ENCOUNTER — Encounter: Payer: Self-pay | Admitting: Family Medicine

## 2013-10-12 VITALS — BP 140/79 | HR 74 | Temp 97.7°F | Ht 63.0 in | Wt 278.4 lb

## 2013-10-12 DIAGNOSIS — I10 Essential (primary) hypertension: Secondary | ICD-10-CM

## 2013-10-12 DIAGNOSIS — E119 Type 2 diabetes mellitus without complications: Secondary | ICD-10-CM

## 2013-10-12 DIAGNOSIS — N200 Calculus of kidney: Secondary | ICD-10-CM

## 2013-10-12 DIAGNOSIS — E785 Hyperlipidemia, unspecified: Secondary | ICD-10-CM

## 2013-10-12 LAB — POCT GLYCOSYLATED HEMOGLOBIN (HGB A1C): Hemoglobin A1C: 7.2

## 2013-10-12 MED ORDER — SIMVASTATIN 20 MG PO TABS
20.0000 mg | ORAL_TABLET | Freq: Every day | ORAL | Status: DC
Start: 2013-10-12 — End: 2016-06-03

## 2013-10-12 NOTE — Progress Notes (Signed)
Tana ConchStephen Hunter, MD Phone: [phone]  Subjective:   Holly MusicKimberlea G [name] is a 53 y.o. year old very pleasant female patient who presents with the following:  Hypertension Hyperlipidemia BP Readings from Last 3 Encounters:  10/12/13 140/79  05/22/13 100/62  01/07/13 144/81  Home BP monitoring-no Compliant with medications-yes amlodipine and metoprolol. Has had hypotension when becomes ill with kidney stones (admitted at [facility]). [facility].  Has run out of simvastatin for some time.  ROS-Denies any chest pain or shortness of breath  History of recurrent kidney stones We also discussed difficulties with recurrent kidney stones. In march, she had her ureteral stent removed. Fortunately, no kidney stones over last month. Has follow up in 2 months.  ROS-no CVA tenderness, no fevers, no dysuria  DIABETES Type II Medications taking and tolerating-off all medications, last time was discharged on nph 12 units in Am and 10 in pm but not taking Blood Sugars per patient-does not check Diet-attempting to avoid sweets and sugary beverages Regular Exercise-no, but starting to exercise more  On Aspirin-yes On statin-refilled today, had been out for some time   ROS- Denies Polyuria,Polydipsia, nocturia, Vision changes, feet or hand numbness/pain/tingling. Denies  Hypoglycemia symptoms (shaky, sweaty, hungry, weak anxious, tremor, palpitations, confusion, behavior change).   Hemoglobin a1c:  Lab Results  Component Value Date   HGBA1C 7.2 10/12/2013   HGBA1C 6.5 01/07/2013   HGBA1C 7.7* 09/27/2012    Past Medical History- Patient Active Problem List   Diagnosis Date Noted  . Recurrent kidney stones 10/03/2012    Priority: High  . Pyelonephritis 09/27/2012    Priority: High  . DMII (diabetes mellitus, type 2) 09/19/2010    Priority: High  . AKI (acute kidney injury) 11/12/2011    Priority: Medium  . Obesity, Class III, BMI 40-49.9 (morbid obesity)  11/08/2011    Priority: Medium  . Hyperlipidemia 09/19/2010    Priority: Medium  . DISORDER, BIPOLAR NOS 09/09/2006    Priority: Medium  . DEPRESSION, MAJOR, RECURRENT 08/29/2006    Priority: Medium  . TOBACCO DEPENDENCE 08/29/2006    Priority: Medium  . HYPERTENSION, BENIGN SYSTEMIC 08/29/2006    Priority: Medium  . UTI (lower urinary tract infection) 11/12/2011  . Right knee pain 03/19/2011  . VENOUS INSUFFICIENCY, CHRONIC 08/29/2006   Medications- reviewed and updated Current Outpatient Prescriptions  Medication Sig Dispense Refill  . sodium bicarbonate 650 MG tablet Take 1,300 mg by mouth.      Marland Kitchen. aspirin 81 MG EC tablet Take 81 mg by mouth daily.       . insulin NPH Human (HUMULIN N,NOVOLIN N) 100 UNIT/ML injection Inject into the skin. (NOT TAKING)      . lisinopril (PRINIVIL,ZESTRIL) 10 MG tablet Take 1 tablet (10 mg total) by mouth daily.  30 tablet  11  . metoprolol tartrate (LOPRESSOR) 25 MG tablet Take 0.5 tablets (12.5 mg total) by mouth 2 (two) times daily.  30 tablet  5  . Multiple Vitamin (MULTIVITAMIN WITH MINERALS) TABS Take 1 tablet by mouth daily.      . simvastatin (ZOCOR) 20 MG tablet Take 1 tablet (20 mg total) by mouth daily at 6 PM.  30 tablet  11     Objective: BP 140/79  Pulse 74  Temp(Src) 97.7 F (36.5 C) (Oral)  Ht 5\' 3"  (1.6 m)  Wt 278 lb 6.4 oz (126.281 kg)  BMI 49.33 kg/m2 Gen: NAD, resting comfortably on table CV: RRR no murmurs rubs or  gallops Lungs: CTAB no crackles, wheeze, rhonchi MSK: no cva tenderness  Assessment/Plan:  DMII (diabetes mellitus, type 2) Patient with a1c of 7.2. Mildly elevated. Discussed increasing activity level and continuing to watch what she eats. She will return to discuss health maintenance. May be worth trial of glipizide if a1c increases in the future. Metformin is risk due to history of AKI with kidney stones.   HYPERTENSION, BENIGN SYSTEMIC SBP just above goal. Given history of hypotension when gets ill  with kidney stones/pyelonephritis, decision made to take off metoprolol (whch she has not taken in 5 days anyway). Will repeat when follows up for DM.   Hyperlipidemia Ran out of simvastatin. Likely poorly controlled, restarted at this time. Consider lipids with next set of blood work mainly to check compliance.   Recurrent kidney stones [facility]. Is compliant with sodium bicarbonate and working on better hydration.    Orders Placed This Encounter  Procedures  . POCT A1C    Meds ordered this encounter  Medications  . sodium bicarbonate 650 MG tablet    Sig: Take 1,300 mg by mouth.  . DISCONTD: simvastatin (ZOCOR) 20 MG tablet    Sig: Take 20 mg by mouth.  . insulin NPH Human (HUMULIN N,NOVOLIN N) 100 UNIT/ML injection    Sig: Inject into the skin.  Marland Kitchen. simvastatin (ZOCOR) 20 MG tablet    Sig: Take 1 tablet (20 mg total) by mouth daily at 6 PM.    Dispense:  30 tablet    Refill:  11

## 2013-10-12 NOTE — Assessment & Plan Note (Signed)
Patient with a1c of 7.2. Mildly elevated. Discussed increasing activity level and continuing to watch what she eats. She will return to discuss health maintenance. May be worth trial of glipizide if a1c increases in the future. Metformin is risk due to history of AKI with kidney stones.

## 2013-10-12 NOTE — Assessment & Plan Note (Signed)
[facility]. Is compliant with sodium bicarbonate and working on better hydration.

## 2013-10-12 NOTE — Patient Instructions (Signed)
Blood pressure-top number slightly high today but due to low blood pressures, let's stop the metoprolol and continue on lisinopril only.   Cholesterol-restarted simvastatin  See me back so we can talk more about your diabetes and do the following: Health Maintenance Due  Topic Date Due  . Ophthalmology Exam  05/28/1972  . Urine Microalbumin  05/28/1972  . Pap Smear  12/24/2010  . Tetanus/tdap  05/02/2012  . Mammogram  05/28/2012  . Colonoscopy  05/28/2012  . Hemoglobin A1c  07/10/2013

## 2013-10-12 NOTE — Assessment & Plan Note (Addendum)
Ran out of simvastatin. Likely poorly controlled, restarted at this time. Consider lipids with next set of blood work mainly to check compliance.

## 2013-10-12 NOTE — Assessment & Plan Note (Signed)
SBP just above goal. Given history of hypotension when gets ill with kidney stones/pyelonephritis, decision made to take off metoprolol (whch she has not taken in 5 days anyway). Will repeat when follows up for DM.

## 2013-10-14 ENCOUNTER — Other Ambulatory Visit: Payer: Self-pay | Admitting: Family Medicine

## 2013-10-14 ENCOUNTER — Other Ambulatory Visit: Payer: Self-pay | Admitting: *Deleted

## 2013-10-14 DIAGNOSIS — E785 Hyperlipidemia, unspecified: Secondary | ICD-10-CM

## 2013-10-14 DIAGNOSIS — I10 Essential (primary) hypertension: Secondary | ICD-10-CM

## 2013-10-28 ENCOUNTER — Encounter: Payer: Self-pay | Admitting: Family Medicine

## 2013-10-28 ENCOUNTER — Ambulatory Visit (INDEPENDENT_AMBULATORY_CARE_PROVIDER_SITE_OTHER): Payer: Self-pay | Admitting: Family Medicine

## 2013-10-28 VITALS — BP 162/86 | HR 96 | Temp 97.9°F | Wt 279.0 lb

## 2013-10-28 DIAGNOSIS — E119 Type 2 diabetes mellitus without complications: Secondary | ICD-10-CM

## 2013-10-28 DIAGNOSIS — I10 Essential (primary) hypertension: Secondary | ICD-10-CM

## 2013-10-28 MED ORDER — GLIMEPIRIDE 2 MG PO TABS: 2.0000 mg | ORAL_TABLET | Freq: Every day | ORAL | Status: DC

## 2013-10-28 MED ORDER — METOPROLOL TARTRATE 25 MG PO TABS: 12.5000 mg | ORAL_TABLET | Freq: Two times a day (BID) | ORAL | Status: DC

## 2013-10-28 NOTE — Assessment & Plan Note (Signed)
Poorly controlled off metoprolol. Symptomatic with headaches as has been in past with high BP.  Restarted and asked patient to stop lisinopril if starts to feel sick from kidney stones in the future. See AVS for follow up plan.

## 2013-10-28 NOTE — Assessment & Plan Note (Signed)
Moderate control. Try low dose amaryl at 2mg  to try to get a1c below 7.

## 2013-10-28 NOTE — Patient Instructions (Signed)
Blood pressure  Sorry it is so high without metoprolol, we restarted this  You are going to call me to let me know what your next 3 blood pressure readings at [facility] are and if your headaches stopped when back on medication  Diabetes  Get the orange card so we can do your health maintenance  We added glimepiride to help get your a1c below 7, remember exercise and healthy eating are just as important as taking the mediicine  Watch out for low blood sugar (see below)  See us in 3 months unless we need to see you sooner based off of blood pressure, Dr. Durene CalHunter  Low Blood Sugar Low blood sugar (hypoglycemia) means that the level of sugar in your blood is lower than it should be. Signs of low blood sugar include:  Getting sweaty.  Feeling hungry.  Feeling dizzy or weak.  Feeling sleepier than normal.  Feeling nervous.  Headaches.  Having a fast heartbeat. Low blood sugar can happen fast and can be an emergency. Your doctor can do tests to check your blood sugar level. You can have low blood sugar and not have diabetes. HOME CARE  Check your blood sugar as told by your doctor. If it is less than 70 mg/dl or as told by your doctor, take 1 of the following:  3 to 4 glucose tablets.   cup clear juice.   cup soda pop, not diet.  1 cup milk.  5 to 6 hard candies.  Recheck blood sugar after 15 minutes. Repeat until it is at the right level.  Eat a snack if it is more than 1 hour until the next meal.  Only take medicine as told by your doctor.  Do not skip meals. Eat on time.  Do not drink alcohol except with meals.  Check your blood glucose before driving.  Check your blood glucose before and after exercise.  Always carry treatment with you, such as glucose pills.  Always wear a medical alert bracelet if you have diabetes. GET HELP RIGHT AWAY IF:   Your blood glucose goes below 70 mg/dl or as told by your doctor, and you:  Are confused.  Are not able to  swallow.  Pass out (faint).  You cannot treat yourself. You may need someone to help you.  You have low blood sugar problems often.  You have problems from your medicines.  You are not feeling better after 3 to 4 days.  You have vision changes. MAKE SURE YOU:   Understand these instructions.  Will watch this condition.  Will get help right away if you are not doing well or get worse. Document Released: 09/12/2009 Document Revised: 09/10/2011 Document Reviewed: 09/12/2009 [id number] Patient Information 2014 [employer] [place].  Health Maintenance Due  Topic Date Due  . Ophthalmology Exam  05/28/1972  . Pap Smear  12/24/2010  . Tetanus/tdap  05/02/2012  . Mammogram  05/28/2012  . Colonoscopy  05/28/2012

## 2013-10-28 NOTE — Progress Notes (Signed)
Tana ConchStephen Ruby Dilone, MD Phone: [phone]  Subjective:   [name] is a 53 y.o. year old very pleasant female patient who presents with the following:  Hypertension BP Readings from Last 3 Encounters:  10/28/13 162/86  10/12/13 140/79  05/22/13 100/62  Home BP monitoring-yes, has been over 150 when checking at [facility] Compliant with medications-yes without side effects, lisinopril only ROS-Denies any chest pain or shortness of breath. Does have mild headache since stopping metoprolol similar to when BP has been high in past.   Diabetes A1c at last visit 7.2. No current medications. Metformin to be avoided given AKI in past with kidney stone issues. Doesn't check sugars. Drinks some sugary beverages. Becoming more active aroudn the house and trying to walk.  ROS- Denies Polyuria,Polydipsia, nocturia, Vision changes, feet or hand numbness/pain/tingling. Denies Hypoglycemia symptoms (shaky, sweaty, hungry, weak anxious, tremor, palpitations, confusion, behavior change).   Past Medical History- Patient Active Problem List   Diagnosis Date Noted  . Recurrent kidney stones 10/03/2012    Priority: High  . Pyelonephritis 09/27/2012    Priority: High  . DMII (diabetes mellitus, type 2) 09/19/2010    Priority: High  . AKI (acute kidney injury) 11/12/2011    Priority: Medium  . Obesity, Class III, BMI 40-49.9 (morbid obesity) 11/08/2011    Priority: Medium  . Hyperlipidemia 09/19/2010    Priority: Medium  . DISORDER, BIPOLAR NOS 09/09/2006    Priority: Medium  . DEPRESSION, MAJOR, RECURRENT 08/29/2006    Priority: Medium  . TOBACCO DEPENDENCE 08/29/2006    Priority: Medium  . HYPERTENSION, BENIGN SYSTEMIC 08/29/2006    Priority: Medium  . UTI (lower urinary tract infection) 11/12/2011  . Right knee pain 03/19/2011  . VENOUS INSUFFICIENCY, CHRONIC 08/29/2006   Medications- reviewed and updated Current Outpatient Prescriptions  Medication Sig Dispense Refill  .  aspirin 81 MG EC tablet Take 81 mg by mouth daily.       Marland Kitchen. glimepiride (AMARYL) 2 MG tablet Take 1 tablet (2 mg total) by mouth daily before breakfast.  30 tablet  5  . insulin NPH Human (HUMULIN N,NOVOLIN N) 100 UNIT/ML injection Inject into the skin.      Marland Kitchen. lisinopril (PRINIVIL,ZESTRIL) 10 MG tablet Take 1 tablet (10 mg total) by mouth daily.  30 tablet  11  . metoprolol tartrate (LOPRESSOR) 25 MG tablet Take 0.5 tablets (12.5 mg total) by mouth 2 (two) times daily.  30 tablet  5  . Multiple Vitamin (MULTIVITAMIN WITH MINERALS) TABS Take 1 tablet by mouth daily.      . simvastatin (ZOCOR) 20 MG tablet Take 1 tablet (20 mg total) by mouth daily at 6 PM.  30 tablet  11  . sodium bicarbonate 650 MG tablet Take 1,300 mg by mouth.       No current facility-administered medications for this visit.    Objective: BP 162/86  Pulse 96  Temp(Src) 97.9 F (36.6 C) (Oral)  Wt 279 lb (126.554 kg) Gen: NAD, resting comfortably on table CV: RRR no murmurs rubs or gallops Lungs: CTAB no crackles, wheeze, rhonchi Ext: trace edema  Assessment/Plan:  DMII (diabetes mellitus, type 2) Moderate control. Try low dose amaryl at 2mg  to try to get a1c below 7.   HYPERTENSION, BENIGN SYSTEMIC Poorly controlled off metoprolol. Symptomatic with headaches as has been in past with high BP.  Restarted and asked patient to stop lisinopril if starts to feel sick from kidney stones in the future. See AVS for follow up plan.  Meds ordered this encounter  Medications  . metoprolol tartrate (LOPRESSOR) 25 MG tablet    Sig: Take 0.5 tablets (12.5 mg total) by mouth 2 (two) times daily.    Dispense:  30 tablet    Refill:  5  . glimepiride (AMARYL) 2 MG tablet    Sig: Take 1 tablet (2 mg total) by mouth daily before breakfast.    Dispense:  30 tablet    Refill:  5

## 2014-02-01 ENCOUNTER — Other Ambulatory Visit: Payer: Self-pay | Admitting: *Deleted

## 2014-02-01 DIAGNOSIS — I10 Essential (primary) hypertension: Secondary | ICD-10-CM

## 2014-02-01 MED ORDER — LISINOPRIL 10 MG PO TABS: 10.0000 mg | ORAL_TABLET | Freq: Every day | ORAL | Status: DC

## 2014-02-03 ENCOUNTER — Other Ambulatory Visit: Payer: Self-pay | Admitting: Family Medicine

## 2014-02-03 DIAGNOSIS — I10 Essential (primary) hypertension: Secondary | ICD-10-CM

## 2014-04-05 LAB — PULMONARY FUNCTION TEST

## 2014-04-16 ENCOUNTER — Inpatient Hospital Stay: Payer: Self-pay | Admitting: Family Medicine

## 2014-04-27 ENCOUNTER — Encounter: Payer: Self-pay | Admitting: Family Medicine

## 2014-04-27 ENCOUNTER — Ambulatory Visit (INDEPENDENT_AMBULATORY_CARE_PROVIDER_SITE_OTHER): Payer: Self-pay | Admitting: Family Medicine

## 2014-04-27 VITALS — BP 158/90 | HR 86 | Temp 98.3°F | Ht 63.0 in | Wt 286.0 lb

## 2014-04-27 DIAGNOSIS — N200 Calculus of kidney: Secondary | ICD-10-CM

## 2014-04-27 DIAGNOSIS — E118 Type 2 diabetes mellitus with unspecified complications: Secondary | ICD-10-CM

## 2014-04-27 LAB — POCT GLYCOSYLATED HEMOGLOBIN (HGB A1C): HEMOGLOBIN A1C: 8.4

## 2014-04-27 MED ORDER — INSULIN REGULAR HUMAN 100 UNIT/ML IJ SOLN: 5.0000 [IU] | Freq: Two times a day (BID) | INTRAMUSCULAR | Status: DC

## 2014-04-27 MED ORDER — INSULIN NPH (HUMAN) (ISOPHANE) 100 UNIT/ML ~~LOC~~ SUSP: 10.0000 [IU] | Freq: Two times a day (BID) | SUBCUTANEOUS | Status: DC

## 2014-04-27 NOTE — Patient Instructions (Signed)
Diabetes Mellitus and Food It is important for you to manage your blood sugar (glucose) level. Your blood glucose level can be greatly affected by what you eat. Eating healthier foods in the appropriate amounts throughout the day at about the same time each day will help you control your blood glucose level. It can also help slow or prevent worsening of your diabetes mellitus. Healthy eating may even help you improve the level of your blood pressure and reach or maintain a healthy weight.  HOW CAN FOOD AFFECT ME? Carbohydrates Carbohydrates affect your blood glucose level more than any other type of food. Your dietitian will help you determine how many carbohydrates to eat at each meal and teach you how to count carbohydrates. Counting carbohydrates is important to keep your blood glucose at a healthy level, especially if you are using insulin or taking certain medicines for diabetes mellitus. Alcohol Alcohol can cause sudden decreases in blood glucose (hypoglycemia), especially if you use insulin or take certain medicines for diabetes mellitus. Hypoglycemia can be a life-threatening condition. Symptoms of hypoglycemia (sleepiness, dizziness, and disorientation) are similar to symptoms of having too much alcohol.  If your health care provider has given you approval to drink alcohol, do so in moderation and use the following guidelines:  Women should not have more than one drink per day, and men should not have more than two drinks per day. One drink is equal to:  12 oz of beer.  5 oz of wine.  1 oz of hard liquor.  Do not drink on an empty stomach.  Keep yourself hydrated. Have water, diet soda, or unsweetened iced tea.  Regular soda, juice, and other mixers might contain a lot of carbohydrates and should be counted. WHAT FOODS ARE NOT RECOMMENDED? As you make food choices, it is important to remember that all foods are not the same. Some foods have fewer nutrients per serving than other  foods, even though they might have the same number of calories or carbohydrates. It is difficult to get your body what it needs when you eat foods with fewer nutrients. Examples of foods that you should avoid that are high in calories and carbohydrates but low in nutrients include:  Trans fats (most processed foods list trans fats on the Nutrition Facts label).  Regular soda.  Juice.  Candy.  Sweets, such as cake, pie, doughnuts, and cookies.  Fried foods. WHAT FOODS CAN I EAT? Have nutrient-rich foods, which will nourish your body and keep you healthy. The food you should eat also will depend on several factors, including:  The calories you need.  The medicines you take.  Your weight.  Your blood glucose level.  Your blood pressure level.  Your cholesterol level. You also should eat a variety of foods, including:  Protein, such as meat, poultry, fish, tofu, nuts, and seeds (lean animal proteins are best).  Fruits.  Vegetables.  Dairy products, such as milk, cheese, and yogurt (low fat is best).  Breads, grains, pasta, cereal, rice, and beans.  Fats such as olive oil, trans fat-free margarine, canola oil, avocado, and olives. DOES EVERYONE WITH DIABETES MELLITUS HAVE THE SAME MEAL PLAN? Because every person with diabetes mellitus is different, there is not one meal plan that works for everyone. It is very important that you meet with a dietitian who will help you create a meal plan that is just right for you. Document Released: 03/15/2005 Document Revised: 06/23/2013 Document Reviewed: 05/15/2013 Exit[employer] Patient Information 2015 [employer]. This   information is not intended to replace advice given to you by your health care provider. Make sure you discuss any questions you have with your health care provider.  

## 2014-04-27 NOTE — Progress Notes (Signed)
    Subjective:    Patient ID: [name] is a 53 y.o. female presenting with Hospitalization Follow-up  on 04/27/2014  HPI: Hospitalized in [facility] for lithotripsy, kept in house due to possible kidney infection.  Has stent in place until 05/07/14. On Bactrim for now. While in hospital, noted BS elevated.  Started on Lispro, but could not afford that post hospitalization. Also had RLE swelling which has improved.  No evidence of DVT on dopplers. Having pain related to chronic back.  Reports lower back pain x 1 year.  Notes Urology states it is not related to stones.  Review of Systems  Constitutional: Negative for fever and chills.  Respiratory: Negative for shortness of breath.   Cardiovascular: Negative for chest pain.  Gastrointestinal: Negative for nausea, vomiting and abdominal pain.  Genitourinary: Negative for dysuria.  Skin: Negative for rash.      Objective:    BP 158/90  Pulse 86  Temp(Src) 98.3 F (36.8 C) (Oral)  Ht 5\' 3"  (1.6 m)  Wt 286 lb (129.729 kg)  BMI 50.68 kg/m2 Physical Exam  Constitutional: She is oriented to person, place, and time. She appears well-developed and well-nourished. No distress.  HENT:  Head: Normocephalic and atraumatic.  Eyes: No scleral icterus.  Neck: Neck supple.  Cardiovascular: Normal rate.   Pulmonary/Chest: Effort normal.  Abdominal: Soft.  Neurological: She is alert and oriented to person, place, and time.  Skin: Skin is warm and dry.  Psychiatric: She has a normal mood and affect.        Assessment & Plan:   Problem List Items Addressed This Visit     Unprioritized   DMII (diabetes mellitus, type 2) - Primary     Have discussed options of medications--will use regular insulin for 25.00$/month. Will get patient in with SW to try to see how we can help arrange some sort of coverage and help with medications and office visits.     Relevant Medications      insulin regular (HUMULIN R) 100 units/mL injection    insulin NPH Human (HUMULIN N,NOVOLIN N) 100 UNIT/ML injection   Other Relevant Orders      POCT glycosylated hemoglobin (Hb A1C) (Completed)   Recurrent kidney stones     To f/u with urology for stent removal and further recommendations.       Return in about 4 weeks (around 05/25/2014) for a follow-up.

## 2014-04-28 NOTE — Progress Notes (Signed)
Pt contacted CSW back. CSW explored why pt has not applied/ received her orange card. Pt states she does not have an ID, though she [facility] Pt states her daughter works but understands that she needs an ID in order to qualify for services and will therefore ask her daughter to take her. CSW also gave pt the contact number to the MAP program in order to get additional information regarding the requirements. Pt agreeable and appreciative.  Theresia BoughNorma Sahmya Arai, MSW, LCSW [phone]

## 2014-04-28 NOTE — Assessment & Plan Note (Signed)
To f/u with urology for stent removal and further recommendations.

## 2014-04-28 NOTE — Assessment & Plan Note (Signed)
Have discussed options of medications--will use regular insulin for 25.00$/month. Will get patient in with SW to try to see how we can help arrange some sort of coverage and help with medications and office visits.

## 2014-04-28 NOTE — Progress Notes (Signed)
CSW LVM for pt.  Akeila Lana, MSW, LCSW [phone]  

## 2014-07-05 ENCOUNTER — Other Ambulatory Visit: Payer: Self-pay | Admitting: *Deleted

## 2014-07-05 DIAGNOSIS — I10 Essential (primary) hypertension: Secondary | ICD-10-CM

## 2014-07-05 MED ORDER — METOPROLOL TARTRATE 25 MG PO TABS: 12.5000 mg | ORAL_TABLET | Freq: Two times a day (BID) | ORAL | Status: DC

## 2014-07-05 NOTE — Telephone Encounter (Signed)
Note to nursing staff - sent refill for Toprol, needs appointment for follow up HTN, please schedule

## 2014-11-19 ENCOUNTER — Ambulatory Visit (INDEPENDENT_AMBULATORY_CARE_PROVIDER_SITE_OTHER): Payer: Self-pay | Admitting: Family Medicine

## 2014-11-19 ENCOUNTER — Encounter: Payer: Self-pay | Admitting: Family Medicine

## 2014-11-19 VITALS — BP 149/78 | HR 105 | Temp 98.1°F | Ht 64.0 in | Wt 276.0 lb

## 2014-11-19 DIAGNOSIS — R399 Unspecified symptoms and signs involving the genitourinary system: Secondary | ICD-10-CM

## 2014-11-19 DIAGNOSIS — N390 Urinary tract infection, site not specified: Secondary | ICD-10-CM

## 2014-11-19 LAB — POCT URINALYSIS DIPSTICK
Bilirubin, UA: NEGATIVE
Ketones, UA: NEGATIVE
Nitrite, UA: NEGATIVE
Protein, UA: NEGATIVE
Urobilinogen, UA: 0.2
pH, UA: 5.5

## 2014-11-19 LAB — POCT UA - MICROSCOPIC ONLY

## 2014-11-19 LAB — BASIC METABOLIC PANEL
BUN: 20 mg/dL (ref 6–23)
CALCIUM: 9.4 mg/dL (ref 8.4–10.5)
CO2: 23 mEq/L (ref 19–32)
Chloride: 101 mEq/L (ref 96–112)
Creat: 1.15 mg/dL — ABNORMAL HIGH (ref 0.50–1.10)
Glucose, Bld: 489 mg/dL — ABNORMAL HIGH (ref 70–99)
Potassium: 4.3 mEq/L (ref 3.5–5.3)
Sodium: 134 mEq/L — ABNORMAL LOW (ref 135–145)

## 2014-11-19 MED ORDER — FLUCONAZOLE 150 MG PO TABS: ORAL_TABLET | ORAL | Status: DC

## 2014-11-19 MED ORDER — SULFAMETHOXAZOLE-TRIMETHOPRIM 800-160 MG PO TABS: 1.0000 | ORAL_TABLET | Freq: Two times a day (BID) | ORAL | Status: AC

## 2014-11-19 NOTE — Progress Notes (Signed)
Subjective:    Patient ID: [name], female    DOB: 1961-01-07, 54 y.o.   MRN: [id number]  HPI  UTI: Pt presents to [facility] for UTI like symptoms. Patient has a history of pyleonephritis and kidney stones. Has been on prophylatic bactrim in the past. Started Wednesday night noticed back on her left side started aching. She has noticed burning with urination and wit taking a bath. Admits to urinary frequency and dysuria.  No fever, chills nausea, vomit or abdominal pain. Patient is eating and drinking well.    No Known Allergies  Past Medical History  Diagnosis Date  . DEPRESSION, MAJOR, RECURRENT 08/29/2006  . DISORDER, BIPOLAR NOS 09/09/2006  . BACK PAIN, LOW 08/29/2006  . OVARIAN MASS 09/18/2007    Benign ovarian cyst s/p laparotomy for removal   . DMII (diabetes mellitus, type 2) 09/19/2010  . Hyperlipidemia 09/19/2010    LDL 78 on 08/2010. Recheck 08/2011.    Marland Kitchen. HYPERTENSION, BENIGN SYSTEMIC 08/29/2006    Well controlled on Lopressor and lisinopril. Check BMET 12/2011    . TOBACCO DEPENDENCE 08/29/2006    Patient quit in early months of 2012. Continue to follow for possible relapse.    . Kidney infection   . Urosepsis 09/27/2012     Past Surgical History  Procedure Laterality Date  . Partial hysterectomy      unsure if had cervix removed.     Review of Systems Per HPI    Objective:   Physical Exam BP 149/78 mmHg  Pulse 105  Temp(Src) 98.1 F (36.7 C) (Oral)  Ht 5\' 4"  (1.626 m)  Wt 276 lb (125.193 kg)  BMI 47.35 kg/m2 Gen: NAD. Nontoxic in appearance, morbidly obese. Caucasian female. HEENT: AT. [place]. Mildly dry mucous membranes. CV: Tachycardic Abd: Soft. Obese. ND. suprapubic pressure. BS present.  MSK: No CVA tenderness bilaterally.      Assessment & Plan:   [name] is a 54 y.o. Patient with signs and symptoms of UTI. Patient has had, a history  with urinary tract infections in the past, as well as pyelonephritis and kidney stones. No red flags  on exam today.  Urinalysis    Component Value Date/Time   COLORURINE YELLOW 05/22/2013 1659   APPEARANCEUR CLOUDY* 05/22/2013 1659   LABSPEC 1.013 05/22/2013 1659   PHURINE 6.5 05/22/2013 1659   GLUCOSEU NEGATIVE 05/22/2013 1659   HGBUR MODERATE* 05/22/2013 1659   HGBUR large 07/25/2009 1125   BILIRUBINUR NEG 11/19/2014 0856   BILIRUBINUR NEGATIVE 05/22/2013 1659   KETONESUR NEGATIVE 05/22/2013 1659   PROTEINUR NEG 11/19/2014 0856   PROTEINUR NEGATIVE 05/22/2013 1659   UROBILINOGEN 0.2 11/19/2014 0856   UROBILINOGEN 1.0 05/22/2013 1659   NITRITE NEG 11/19/2014 0856   NITRITE POSITIVE* 05/22/2013 1659   LEUKOCYTESUR moderate (2+) 11/19/2014 0856     Patient given Bactrim, she has had UTIs with Escherichia coli resistant to Cipro in the past. Has responded well to Bactrim and needs a medication that is not expensive since she does not have insurance. - Urine culture ordered Also gave patient a prescription for Diflucan, to start one today, and then another after her antibiotic course is completed. Advised her that if her yeast infection did not get better with this regimen, then she is to return to clinic and we will have to do a wet prep. Patient's glucose was greater than 1000 in her urine, she was tachycardic which is her baseline, and still had some elevated blood pressures. Advised her to  follow-up with her primary care physician within the next few weeks to discuss these and get it under better control. I have also ordered a BMP for her today, that can be followed up with her PCP in 2 weeks to assess her kidney function. It sounds like her diabetes was better controlled when she was on metformin as well, but was taking off of it secondary to increased creatinine secondary to kidney stones.

## 2014-11-19 NOTE — Assessment & Plan Note (Signed)
Patient with signs and symptoms of UTI. Patient has had, Of course with urinary tract infections in the past, as well as pyelonephritis and kidney stones. No red flags on exam today. Urinalysis Urinalysis    Component Value Date/Time   COLORURINE YELLOW 05/22/2013 1659   APPEARANCEUR CLOUDY* 05/22/2013 1659   LABSPEC 1.013 05/22/2013 1659   PHURINE 6.5 05/22/2013 1659   GLUCOSEU NEGATIVE 05/22/2013 1659   HGBUR MODERATE* 05/22/2013 1659   HGBUR large 07/25/2009 1125   BILIRUBINUR NEG 11/19/2014 0856   BILIRUBINUR NEGATIVE 05/22/2013 1659   KETONESUR NEGATIVE 05/22/2013 1659   PROTEINUR NEG 11/19/2014 0856   PROTEINUR NEGATIVE 05/22/2013 1659   UROBILINOGEN 0.2 11/19/2014 0856   UROBILINOGEN 1.0 05/22/2013 1659   NITRITE NEG 11/19/2014 0856   NITRITE POSITIVE* 05/22/2013 1659   LEUKOCYTESUR moderate (2+) 11/19/2014 0856     Patient given Bactrim, she has had UTIs with Escherichia coli resistant to Cipro in the past. Has responded well to Bactrim and needs a medication that is not expensive since she does not have insurance. Also gave patient a prescription for Diflucan, to start one today, and then another after her antibiotic course is completed. Advised her that if her yeast infection did not get better with this regimen, then she is to return to clinic and we will have to do a wet prep. Patient's glucose was greater than 1000 in her urine, she was tachycardic which is her baseline, and still had some elevated blood pressures. Advised her to follow-up with her primary care physician within the next few weeks to discuss these and get it under better control. I have also ordered a BMP for her today, that can be followed up with her PCP in 2 weeks to assess her kidney function. It sounds like her diabetes was better controlled when she was on metformin as well, but was taking off of it secondary to increased creatinine secondary to kidney stones.

## 2014-11-19 NOTE — Patient Instructions (Signed)
It is likely have a urinary tract infection, by the looks of your urinalysis today. I will send it for culture, and somebody will call you if we need to change her antibiotics. Start her antibiotics today. Drink plenty of fluids. Watch her diet more closely, and get her diabetes under better control. Follow up with your primary in 2 weeks to discuss her hypertension and your diabetes. Return to the clinic if you experience more pain, her unable to tolerate liquids, vomiting or high fever. I have also called in Diflucan, one pill for you to take today, and another pill for you to take after your antibiotics are completed.

## 2014-11-20 LAB — URINE CULTURE
Colony Count: NO GROWTH
Organism ID, Bacteria: NO GROWTH

## 2014-11-22 ENCOUNTER — Telehealth: Payer: Self-pay | Admitting: Family Medicine

## 2014-11-22 NOTE — Telephone Encounter (Signed)
Attempted to call 2 different numbers to reach pt today. Nondescript message was left for her to call her doctors office for lab results. Please attempt to call again.   Patient was seen in [facility] for UTI symptoms on Friday. Her urine contained yeast only (no bacteria). She was given appropriate, diflucan. She can discontinue the bactrim and take only the diflucan. Yeast overgrowth is likely secondary to her uncontrolled blood sugars.  Her Blood sugar was elevated to 489. This is too high. She was encouraged to make an appointment to discuss her diabetes (as well as BP and tachycardia) with her PCP within 1-2 weeks.  - Please giver her results of urine culture, Diflucan continued and antibiotic discontinued.  - Please giver her the results of her glucose so she is aware how important it is to make an appointment with her PCP. - Please encourage her to make an appointment with her PCP ASAP to follow up on her high sugar/diabetes.  - If she is experiencing nausea, vomiting, headache, blurry vision, fatigue ... please [facility] or urgent care if after hours, as these are signs of hyperglycemia that may need more emergent treatment. Drink plenty of water.  Thanks.

## 2014-11-22 NOTE — Telephone Encounter (Signed)
Left voice message for patient to call clinic regarding lab results from [facility]. Clovis PuMartin, Tamika L, RN

## 2014-11-23 NOTE — Telephone Encounter (Signed)
LMTCB to advise as below, will try again later. Desree Leap, CMA.

## 2014-11-23 NOTE — Telephone Encounter (Signed)
Advised pt as directed below and verbalized understanding. Pt has FU appt. On 12/08/14 @2 :30pm with PCP. Journee Kohen, CMA.

## 2014-12-08 ENCOUNTER — Encounter: Payer: Self-pay | Admitting: Student

## 2014-12-08 ENCOUNTER — Ambulatory Visit (INDEPENDENT_AMBULATORY_CARE_PROVIDER_SITE_OTHER): Payer: Self-pay | Admitting: Student

## 2014-12-08 VITALS — BP 136/78 | HR 96 | Temp 97.9°F | Ht 64.0 in | Wt 270.4 lb

## 2014-12-08 DIAGNOSIS — Z720 Tobacco use: Secondary | ICD-10-CM

## 2014-12-08 DIAGNOSIS — B379 Candidiasis, unspecified: Secondary | ICD-10-CM

## 2014-12-08 DIAGNOSIS — F17200 Nicotine dependence, unspecified, uncomplicated: Secondary | ICD-10-CM

## 2014-12-08 DIAGNOSIS — E119 Type 2 diabetes mellitus without complications: Secondary | ICD-10-CM

## 2014-12-08 DIAGNOSIS — I10 Essential (primary) hypertension: Secondary | ICD-10-CM

## 2014-12-08 DIAGNOSIS — E118 Type 2 diabetes mellitus with unspecified complications: Secondary | ICD-10-CM

## 2014-12-08 LAB — POCT GLYCOSYLATED HEMOGLOBIN (HGB A1C): HEMOGLOBIN A1C: 14.2

## 2014-12-08 MED ORDER — LISINOPRIL 10 MG PO TABS: 10.0000 mg | ORAL_TABLET | Freq: Every day | ORAL | Status: DC

## 2014-12-08 MED ORDER — FLUCONAZOLE 150 MG PO TABS: 150.0000 mg | ORAL_TABLET | Freq: Once | ORAL | Status: DC

## 2014-12-08 MED ORDER — METFORMIN HCL 500 MG PO TABS: 500.0000 mg | ORAL_TABLET | Freq: Two times a day (BID) | ORAL | Status: DC

## 2014-12-08 NOTE — Assessment & Plan Note (Addendum)
A1c today 14.2. Given improved DM control when was on metformin it appears that being off the metformin has been deleterious to her sugar control. Cr 1.15 11/19/2014.  - Will restart metformin 500 BID today - switch to lisinopril 10 for BP and d/c metoprolol - last urine protein was negative on dip - last optho appt 1 year ago, pt counseled to get one this year - foot exam today 12/08/2014, next due 2017 - healthy diet and exercise reviewed and encouraged

## 2014-12-08 NOTE — Assessment & Plan Note (Signed)
Pt has not been taklng lisinopril as was told to hold it with kidney issues. BP 159/87 initially then 136/78 today on repeat - given DM will counsel to d/c metoprolol and start lisinopril - monitor closely

## 2014-12-08 NOTE — Patient Instructions (Signed)
Please follow up in 3 months for diabetes check Please stop metoprolol and start lisinopril Take diflucan for yeast once. You have refills, so if no improvement in 3 days repeat dose Please follow with opthomology for eye exam

## 2014-12-08 NOTE — Assessment & Plan Note (Signed)
Continued smoking with 1ppd - Counseled on cessation and encouraged to quit, Discussed risks especially with other cardiac risk factors she has - Does not want to quit at this time - continue to discuss

## 2014-12-08 NOTE — Progress Notes (Signed)
   Subjective:    Patient ID: Holly MusicKimberlea G Pulcini, female    DOB: 1960/10/08, 54 y.o.   MRN: [id number]   CC: Diabetes check  HPI  54 y/o presenting for diabetes check. Additionally reports vaginal itching similar to what she was recenlty treated for  DM A1c today 14.2. Pt reports she is taking NPH and regular as prescribed. She is trying to eat a healthy diet, but is not exercising secondary to back pain. She otherwise denies numbness/tngling/paion in her feet or hands, denies, changes in vision, denies chest pain, SOB, headache Expresses some frustration at hearing her a1c today and is interested in imprroving it. Reports that while she was on metformin her blood sugars were better controlled. Metformin stopped in setting of kidney stones and creatinine elevation. Creatinine was 1.5 in 09/2012 She presented for [facility] for yeast vaginitis at which time her urine glucose was >1000. Reports her sugars at home run 250-300.  Vaginal itching -Has had vaginal itching with discharge for the last two days - Does not wish to have pelvic exam today and requests refill of diflucan. Denies mal-odor, or irritation. Denies dysuria   Past medical history, surgical, family, and social history reviewed and updated in the EMR as appropriate.  Past Medical History  Diagnosis Date  . DEPRESSION, MAJOR, RECURRENT 08/29/2006  . DISORDER, BIPOLAR NOS 09/09/2006  . BACK PAIN, LOW 08/29/2006  . OVARIAN MASS 09/18/2007    Benign ovarian cyst s/p laparotomy for removal   . DMII (diabetes mellitus, type 2) 09/19/2010  . Hyperlipidemia 09/19/2010    LDL 78 on 08/2010. Recheck 08/2011.    Marland Kitchen. HYPERTENSION, BENIGN SYSTEMIC 08/29/2006    Well controlled on Lopressor and lisinopril. Check BMET 12/2011    . TOBACCO DEPENDENCE 08/29/2006    Patient quit in early months of 2012. Continue to follow for possible relapse.    . Kidney infection   . Urosepsis 09/27/2012   History   Social History  . Marital Status: Single   Spouse Name: N/A  . Number of Children: N/A  . Years of Education: N/A   Occupational History  . Not on file.   Social History Main Topics  . Smoking status: Current Every Day Smoker -- 1.00 packs/day    Types: Cigarettes  . Smokeless tobacco: Not on file  . Alcohol Use: No  . Drug Use: No  . Sexual Activity: Not on file   Other Topics Concern  . Not on file   Social History Narrative    Objective:  BP 136/78 mmHg  Pulse 96  Temp(Src) 97.9 F (36.6 C) (Oral)  Ht 5\' 4"  (1.626 m)  Wt 270 lb 6 oz (122.641 kg)  BMI 46.39 kg/m2 Vitals and nursing note reviewed  General: NAD Cardiac: RRR, normal heart sounds, no murmurs. Limited by body habitus Respiratory: CTAB, normal effort. Limited by body habitus Abdomen: soft, nontender, nondistended, Extremities: no edema or cyanosis. WWP. Skin: warm and dry, no rashes noted Neuro: alert and oriented, no focal deficits Feet: See documented foot exam   Assessment & Plan:  See Problem List      Torra Pala A. Kennon RoundsHaney MD, MS Family Medicine Resident PGY-1 Pager [phone]

## 2015-03-30 ENCOUNTER — Encounter: Payer: Self-pay | Admitting: Student

## 2015-03-30 ENCOUNTER — Ambulatory Visit (INDEPENDENT_AMBULATORY_CARE_PROVIDER_SITE_OTHER): Payer: Self-pay | Admitting: Student

## 2015-03-30 VITALS — BP 141/83 | HR 109 | Temp 98.2°F | Ht 64.0 in | Wt 268.5 lb

## 2015-03-30 DIAGNOSIS — Z0000 Encounter for general adult medical examination without abnormal findings: Secondary | ICD-10-CM

## 2015-03-30 DIAGNOSIS — I10 Essential (primary) hypertension: Secondary | ICD-10-CM

## 2015-03-30 DIAGNOSIS — E118 Type 2 diabetes mellitus with unspecified complications: Secondary | ICD-10-CM

## 2015-03-30 LAB — POCT GLYCOSYLATED HEMOGLOBIN (HGB A1C): Hemoglobin A1C: 8.3

## 2015-03-30 NOTE — Patient Instructions (Signed)
Congrats on reducing you A1c!! Your A1c today is 8.3 Please continue your medicines as prescribed You are due for your yearly eye doctor appointment, please obtain it soon Please obtain your mammogram and colonoscopy. You were given given paper with the numbers on them to schedule these tests

## 2015-03-31 DIAGNOSIS — Z0000 Encounter for general adult medical examination without abnormal findings: Secondary | ICD-10-CM | POA: Insufficient documentation

## 2015-03-31 NOTE — Assessment & Plan Note (Signed)
Pap due, will perform a next visit Pt encouraged to obtain flu shot, will obtain today Mammogram and colonoscopy paper work given. Mammo ordered

## 2015-03-31 NOTE — Assessment & Plan Note (Signed)
A1c 8.3 today << 14.2 with addition of metformin - encouraged to obtain eye exam this year - Foot exam due 12/2015 - diet and exercise as tolerated encouraged

## 2015-03-31 NOTE — Progress Notes (Signed)
   Subjective:    Patient ID: [name], female    DOB: [birth year], 53 y.o.   MRN: [id number]   CC: DM check  HPI 54 y/o with PMH HTN, DM presenting for A1c  DM A1c today 8.3 from 14.2. At last visit metorfin was added to regimen which pt has tolerated well. Pt has been limiting unhealthy foods and tries to exercise as tolerated. She does have chronic back pain which she feels limits exercise Does report " sharp pains" in toes 1-2 times per week. But does not wish to start medication for this at this time. She does wear protective foot wear at all times. She has yet to have her ophtho exam this year  HTN Lisinopril added in place of metoprolol at last visit, which pt has tolerated well   Review of Systems   See HPI for ROS.   Past Medical History  Diagnosis Date  . DEPRESSION, MAJOR, RECURRENT 08/29/2006  . DISORDER, BIPOLAR NOS 09/09/2006  . BACK PAIN, LOW 08/29/2006  . OVARIAN MASS 09/18/2007    Benign ovarian cyst s/p laparotomy for removal   . DMII (diabetes mellitus, type 2) 09/19/2010  . Hyperlipidemia 09/19/2010    LDL 78 on 08/2010. Recheck 08/2011.    Marland Kitchen HYPERTENSION, BENIGN SYSTEMIC 08/29/2006    Well controlled on Lopressor and lisinopril. Check BMET 12/2011    . TOBACCO DEPENDENCE 08/29/2006    Patient quit in early months of 2012. Continue to follow for possible relapse.    . Kidney infection   . Urosepsis 09/27/2012   Past Surgical History  Procedure Laterality Date  . Partial hysterectomy      unsure if had cervix removed.    OB History    No data available     Social History   Social History  . Marital Status: Single    Spouse Name: N/A  . Number of Children: N/A  . Years of Education: N/A   Occupational History  . Not on file.   Social History Main Topics  . Smoking status: Current Every Day Smoker -- 1.00 packs/day    Types: Cigarettes  . Smokeless tobacco: Not on file  . Alcohol Use: No  . Drug Use: No  . Sexual Activity: Not on file    Other Topics Concern  . Not on file   Social History Narrative    Objective:  BP 141/83 mmHg  Pulse 109  Temp(Src) 98.2 F (36.8 C) (Oral)  Ht  (1.626 m)  Wt 268 lb 8 oz (121.791 kg)  BMI 46.07 kg/m2 Vitals and nursing note reviewed  General: NAD Cardiac: RRR, Respiratory: CTAB, normal effort Neuro: alert and oriented, no focal deficits   Assessment & Plan:    HYPERTENSION, BENIGN SYSTEMIC Well controlled on Lisinopril. Will continue to monitor  DMII (diabetes mellitus, type 2) A1c 8.3 today << 14.2 with addition of metformin - encouraged to obtain eye exam this year - Foot exam due 12/2015 - diet and exercise as tolerated encouraged   Health care maintenance Pap due, will perform a next visit Pt encouraged to obtain flu shot, will obtain today Mammogram and colonoscopy paper work given. Mammo ordered     Cristi Gwynn A. Kennon Rounds MD, MS Family Medicine Resident PGY-1 Pager [phone]

## 2015-03-31 NOTE — Assessment & Plan Note (Signed)
Well controlled on Lisinopril. Will continue to monitor

## 2015-11-22 ENCOUNTER — Other Ambulatory Visit: Payer: Self-pay | Admitting: *Deleted

## 2015-11-24 MED ORDER — METFORMIN HCL 500 MG PO TABS: 500.0000 mg | ORAL_TABLET | Freq: Two times a day (BID) | ORAL | Status: DC

## 2015-12-26 ENCOUNTER — Other Ambulatory Visit: Payer: Self-pay | Admitting: Student

## 2016-01-04 ENCOUNTER — Emergency Department (HOSPITAL_COMMUNITY)
Admission: EM | Admit: 2016-01-04 | Discharge: 2016-01-05 | Disposition: A | Discharge status: Home / Self Care | Payer: Self-pay | Attending: Emergency Medicine | Admitting: Emergency Medicine

## 2016-01-04 ENCOUNTER — Encounter (HOSPITAL_COMMUNITY): Payer: Self-pay | Admitting: *Deleted

## 2016-01-04 DIAGNOSIS — Z7984 Long term (current) use of oral hypoglycemic drugs: Secondary | ICD-10-CM | POA: Insufficient documentation

## 2016-01-04 DIAGNOSIS — F17210 Nicotine dependence, cigarettes, uncomplicated: Secondary | ICD-10-CM | POA: Insufficient documentation

## 2016-01-04 DIAGNOSIS — Z79899 Other long term (current) drug therapy: Secondary | ICD-10-CM | POA: Insufficient documentation

## 2016-01-04 DIAGNOSIS — N12 Tubulo-interstitial nephritis, not specified as acute or chronic: Secondary | ICD-10-CM | POA: Insufficient documentation

## 2016-01-04 DIAGNOSIS — I10 Essential (primary) hypertension: Secondary | ICD-10-CM | POA: Insufficient documentation

## 2016-01-04 DIAGNOSIS — Z794 Long term (current) use of insulin: Secondary | ICD-10-CM | POA: Insufficient documentation

## 2016-01-04 DIAGNOSIS — E119 Type 2 diabetes mellitus without complications: Secondary | ICD-10-CM | POA: Insufficient documentation

## 2016-01-04 DIAGNOSIS — Z7982 Long term (current) use of aspirin: Secondary | ICD-10-CM | POA: Insufficient documentation

## 2016-01-04 LAB — URINALYSIS, ROUTINE W REFLEX MICROSCOPIC
BILIRUBIN URINE: NEGATIVE
Glucose, UA: NEGATIVE mg/dL
KETONES UR: NEGATIVE mg/dL
NITRITE: POSITIVE — AB
Protein, ur: NEGATIVE mg/dL
Specific Gravity, Urine: 1.018 (ref 1.005–1.030)
pH: 6 (ref 5.0–8.0)

## 2016-01-04 LAB — URINE MICROSCOPIC-ADD ON

## 2016-01-04 MED ORDER — DEXTROSE 5 % IV SOLN
1.0000 g | Freq: Once | INTRAVENOUS | Status: AC
  Administered 2016-01-04: 1 g via INTRAVENOUS
  Filled 2016-01-04: qty 10

## 2016-01-04 MED ORDER — HYDROCODONE-ACETAMINOPHEN 5-325 MG PO TABS
1.0000 | ORAL_TABLET | Freq: Once | ORAL | Status: AC
  Administered 2016-01-04: 1 via ORAL
  Filled 2016-01-04: qty 1

## 2016-01-04 MED ORDER — SODIUM CHLORIDE 0.9 % IV BOLUS (SEPSIS)
1000.0000 mL | Freq: Once | INTRAVENOUS | Status: AC
  Administered 2016-01-04: 1000 mL via INTRAVENOUS

## 2016-01-04 NOTE — ED Notes (Signed)
Pt c/o left flank pain sine last night. Endorses painful urination. States she passed kidney stones 1 month ago.

## 2016-01-04 NOTE — ED Provider Notes (Signed)
CSN: [id number]     Arrival date & time 01/04/16  2143 History  By signing my name below, I, [name], attest that this documentation has been prepared under the direction and in the presence of Shon Batonourtney F [name], MD. Electronically Signed: Doreatha MartinEva [name], ED Scribe. 01/04/2016. 11:19 PM.   Chief Complaint  Patient presents with  . Flank Pain   The history is provided by the patient. No language interpreter was used.   HPI Comments: [name] is a 55 y.o. female with h/o DM2, HTN who presents to the Emergency Department complaining of moderate, sharp, non-radiating, intermittent 8-9/10 left flank pain onset last night with associated mild dysuria. Pt denies taking OTC medications at home to improve symptoms. No other worsening or alleviating factors noted. Pt reports her current symptoms feel more similar to a UTI than her recent h/o renal calculi. Pt denies fever, hematuria, nausea, emesis.   Past Medical History  Diagnosis Date  . DEPRESSION, MAJOR, RECURRENT 08/29/2006  . DISORDER, BIPOLAR NOS 09/09/2006  . BACK PAIN, LOW 08/29/2006  . OVARIAN MASS 09/18/2007    Benign ovarian cyst s/p laparotomy for removal   . DMII (diabetes mellitus, type 2) (HCC) 09/19/2010  . Hyperlipidemia 09/19/2010    LDL 78 on 08/2010. Recheck 08/2011.    Marland Kitchen. HYPERTENSION, BENIGN SYSTEMIC 08/29/2006    Well controlled on Lopressor and lisinopril. Check BMET 12/2011    . TOBACCO DEPENDENCE 08/29/2006    Patient quit in early months of 2012. Continue to follow for possible relapse.    . Kidney infection   . Urosepsis 09/27/2012   Past Surgical History  Procedure Laterality Date  . Partial hysterectomy      unsure if had cervix removed.    No family history on file. Social History  Substance Use Topics  . Smoking status: Current Every Day Smoker -- 1.00 packs/day    Types: Cigarettes  . Smokeless tobacco: None  . Alcohol Use: No   OB History    No data available     Review of Systems   Constitutional: Negative for fever.  Gastrointestinal: Negative for nausea and vomiting.  Genitourinary: Positive for dysuria and flank pain. Negative for hematuria.  All other systems reviewed and are negative.   Allergies  Tamsulosin  Home Medications   Prior to Admission medications   Medication Sig Start Date End Date Taking? Authorizing Provider  aspirin 81 MG EC tablet Take 81 mg by mouth daily.    Yes Historical Provider, MD  insulin NPH Human (HUMULIN N,NOVOLIN N) 100 UNIT/ML injection Inject 0.1-0.12 mLs (10-12 Units total) into the skin 2 (two) times daily. 12 units q am, 10 units q hs 04/27/14  Yes Reva Boresanya S Pratt, MD  insulin regular (HUMULIN R) 100 units/mL injection Inject 0.05 mLs (5 Units total) into the skin 2 (two) times daily before a meal. 04/27/14  Yes Reva Boresanya S Pratt, MD  lisinopril (PRINIVIL,ZESTRIL) 10 MG tablet Take 1 tablet (10 mg total) by mouth daily. 12/08/14 12/25/16 Yes Alyssa A Kennon RoundsHaney, MD  metFORMIN (GLUCOPHAGE) 500 MG tablet Take 1 tablet (500 mg total) by mouth 2 (two) times daily with a meal. 11/24/15  Yes Alyssa A Haney, MD  Multiple Vitamin (MULTIVITAMIN WITH MINERALS) TABS Take 1 tablet by mouth daily.   Yes Historical Provider, MD  cephALEXin (KEFLEX) 500 MG capsule Take 1 capsule (500 mg total) by mouth 3 (three) times daily. 01/05/16   Shon Batonourtney F Amritpal Shropshire, MD  fluconazole (DIFLUCAN) 150 MG tablet Take  1 tab today, take second tab after antibiotics course is finished. Patient not taking: Reported on 12/08/2014 11/19/14   Renee A Kuneff, DO  fluconazole (DIFLUCAN) 150 MG tablet Take 1 tablet (150 mg total) by mouth once. 12/08/14   Bonney Aid, MD  glimepiride (AMARYL) 2 MG tablet Take 1 tablet (2 mg total) by mouth daily before breakfast. Patient not taking: Reported on 12/08/2014 10/28/13   Shelva Majestic, MD  HYDROcodone-acetaminophen (NORCO/VICODIN) 5-325 MG tablet Take 1 tablet by mouth every 6 (six) hours as needed. 01/05/16   Shon Baton, MD  lisinopril  (PRINIVIL,ZESTRIL) 10 MG tablet TAKE 1 TABLET (10 MG TOTAL) BY MOUTH DAILY. 12/26/15   Alyssa A Haney, MD  ondansetron (ZOFRAN ODT) 4 MG disintegrating tablet Take 1 tablet (4 mg total) by mouth every 8 (eight) hours as needed for nausea or vomiting. 01/05/16   Shon Baton, MD  simvastatin (ZOCOR) 20 MG tablet Take 1 tablet (20 mg total) by mouth daily at 6 PM. 10/12/13   Shelva Majestic, MD   BP 121/66 mmHg  Pulse 88  Temp(Src) 98.2 F (36.8 C) (Oral)  Resp 18  SpO2 94% Physical Exam  Constitutional: She is oriented to person, place, and time. She appears well-developed and well-nourished.  Obese  HENT:  Head: Normocephalic and atraumatic.  Cardiovascular: Normal rate, regular rhythm and normal heart sounds.   No murmur heard. Pulmonary/Chest: Effort normal and breath sounds normal. No respiratory distress. She has no wheezes.  Abdominal: Soft. Bowel sounds are normal. There is no tenderness. There is no rebound.  Genitourinary:  Mild left-sided CVA tenderness  Neurological: She is alert and oriented to person, place, and time.  Skin: Skin is warm and dry.  Psychiatric: She has a normal mood and affect.  Nursing note and vitals reviewed.   ED Course  Procedures (including critical care time) DIAGNOSTIC STUDIES: Oxygen Saturation is 93% on RA, adequate by my interpretation.    COORDINATION OF CARE: 11:18 PM Discussed treatment plan with pt at bedside which includes UA and pt agreed to plan.   Labs Review Labs Reviewed  URINALYSIS, ROUTINE W REFLEX MICROSCOPIC (NOT AT [facility]) - Abnormal; Notable for the following:    APPearance CLOUDY (*)    Hgb urine dipstick SMALL (*)    Nitrite POSITIVE (*)    Leukocytes, UA LARGE (*)    All other components within normal limits  URINE MICROSCOPIC-ADD ON - Abnormal; Notable for the following:    Squamous Epithelial / LPF 0-5 (*)    Bacteria, UA MANY (*)    All other components within normal limits  CBC WITH DIFFERENTIAL/PLATELET -  Abnormal; Notable for the following:    WBC 11.3 (*)    Hemoglobin 15.8 (*)    HCT 47.1 (*)    All other components within normal limits  BASIC METABOLIC PANEL - Abnormal; Notable for the following:    Glucose, Bld 113 (*)    All other components within normal limits  URINE CULTURE    Imaging Review US Renal  01/05/2016  CLINICAL DATA:  Acute onset of left flank pain.  Initial encounter. EXAM: RENAL / URINARY TRACT ULTRASOUND COMPLETE COMPARISON:  CT of the abdomen and pelvis from 05/22/2013, and renal ultrasound performed 09/27/2012 FINDINGS: Right Kidney: Length: 11.4 cm. Echogenicity within normal limits. Mild pelvicaliectasis is noted. No mass or hydronephrosis visualized. Stones within the right kidney measure to 0.8 cm in size. Left Kidney: Length: 10.4 cm. Echogenicity within normal limits.  Minimal left-sided hydronephrosis is noted. No mass is seen. Stones within the left kidney measure up to 0.7 cm size. Bladder: Appears normal for degree of bladder distention. IMPRESSION: 1. Minimal left-sided hydronephrosis may reflect recurrent distal obstruction. 2. Nonobstructing bilateral renal stones seen, measuring up to 0.8 cm in size. Electronically Signed   By: Roanna RaiderJeffery  Chang M.D.   On: 01/05/2016 01:02   Ct Renal Stone Study  01/05/2016  CLINICAL DATA:  Acute onset of left flank pain and dysuria. Initial encounter. EXAM: CT ABDOMEN AND PELVIS WITHOUT CONTRAST TECHNIQUE: Multidetector CT imaging of the abdomen and pelvis was performed following the standard protocol without IV contrast. COMPARISON:  CT of the abdomen and pelvis performed 05/22/2013, and renal ultrasound performed earlier today at 12:14 a.m. FINDINGS: The visualized lung bases are clear. The liver and spleen are unremarkable in appearance. The patient is status post cholecystectomy, with clips noted at the gallbladder fossa. The pancreas and adrenal glands are unremarkable. Scattered bilateral renal parenchymal calcifications are  seen, with scattered small bilateral renal stones seen. A few small bilateral renal cysts are seen. There is no evidence of hydronephrosis. No obstructing ureteral stones are identified. Mild nonspecific perinephric stranding is noted bilaterally. No free fluid is identified. The small bowel is unremarkable in appearance. The stomach is within normal limits. No acute vascular abnormalities are seen. Mild calcification is seen along the abdominal aorta and its branches. The appendix is normal in caliber, without evidence of appendicitis. The colon is unremarkable in appearance. The bladder is mildly distended and grossly unremarkable. The remaining uterus is unremarkable in appearance. No suspicious adnexal masses are seen. No inguinal lymphadenopathy is seen. No acute osseous abnormalities are identified. Mild underlying facet disease is noted along the lower lumbar spine. IMPRESSION: 1. No acute abnormality seen to explain the patient's symptoms. 2. Nonobstructing small bilateral renal stones noted. Scattered bilateral renal parenchymal calcifications seen. 3. Few small bilateral renal cysts seen. 4. Mild calcification along the abdominal aorta and its branches. Electronically Signed   By: Roanna RaiderJeffery  Chang M.D.   On: 01/05/2016 02:33   I have personally reviewed and evaluated these images and lab results as part of my medical decision-making.   MDM   Final diagnoses:  Pyelonephritis    Patient presents with left back pain. Nontoxic-appearing on exam. Afebrile. History of kidney stones. Also reports dysuria. Urinalysis appears grossly infected. It is nitrite positive with too numerous to count white cells. Urine culture sent. Patient was given IV Rocephin. She is nontoxic-appearing and afebrile and no signs of sepsis. Basic labwork with a white count of 11.3. Given history of stones, will obtain renal ultrasound to rule out concomitant kidney stone.  Renal ultrasound shows hydronephrosis on the left  concerning for possible distal stone. CT obtained to assess for distal stone. CT negative for stone.  She continues to be nontoxic-appearing. Will treat for pyelonephritis.  After history, exam, and medical workup I feel the patient has been appropriately medically screened and is safe for discharge home. Pertinent diagnoses were discussed with the patient. Patient was given return precautions.  I personally performed the services described in this documentation, which was scribed in my presence. The recorded information has been reviewed and is accurate.   Shon Batonourtney F Natosha Bou, MD 01/05/16 [phone]

## 2016-01-05 ENCOUNTER — Emergency Department (HOSPITAL_COMMUNITY): Payer: Self-pay

## 2016-01-05 ENCOUNTER — Encounter (HOSPITAL_COMMUNITY): Payer: Self-pay | Admitting: Radiology

## 2016-01-05 LAB — CBC WITH DIFFERENTIAL/PLATELET
BASOS ABS: 0.1 10*3/uL (ref 0.0–0.1)
BASOS PCT: 1 %
Eosinophils Absolute: 0.3 10*3/uL (ref 0.0–0.7)
Eosinophils Relative: 2 %
HEMATOCRIT: 47.1 % — AB (ref 36.0–46.0)
Hemoglobin: 15.8 g/dL — ABNORMAL HIGH (ref 12.0–15.0)
Lymphocytes Relative: 32 %
Lymphs Abs: 3.6 10*3/uL (ref 0.7–4.0)
MCH: 31.7 pg (ref 26.0–34.0)
MCHC: 33.5 g/dL (ref 30.0–36.0)
MCV: 94.6 fL (ref 78.0–100.0)
MONO ABS: 0.9 10*3/uL (ref 0.1–1.0)
Monocytes Relative: 8 %
NEUTROS ABS: 6.4 10*3/uL (ref 1.7–7.7)
Neutrophils Relative %: 57 %
PLATELETS: 301 10*3/uL (ref 150–400)
RBC: 4.98 MIL/uL (ref 3.87–5.11)
RDW: 12.5 % (ref 11.5–15.5)
WBC: 11.3 10*3/uL — ABNORMAL HIGH (ref 4.0–10.5)

## 2016-01-05 LAB — BASIC METABOLIC PANEL
ANION GAP: 7 (ref 5–15)
BUN: 13 mg/dL (ref 6–20)
CALCIUM: 10.2 mg/dL (ref 8.9–10.3)
CO2: 26 mmol/L (ref 22–32)
Chloride: 105 mmol/L (ref 101–111)
Creatinine, Ser: 0.93 mg/dL (ref 0.44–1.00)
Glucose, Bld: 113 mg/dL — ABNORMAL HIGH (ref 65–99)
Potassium: 4.7 mmol/L (ref 3.5–5.1)
SODIUM: 138 mmol/L (ref 135–145)

## 2016-01-05 MED ORDER — ONDANSETRON 4 MG PO TBDP: 4.0000 mg | ORAL_TABLET | Freq: Three times a day (TID) | ORAL | Status: DC | PRN

## 2016-01-05 MED ORDER — HYDROCODONE-ACETAMINOPHEN 5-325 MG PO TABS: 1.0000 | ORAL_TABLET | Freq: Four times a day (QID) | ORAL | Status: DC | PRN

## 2016-01-05 MED ORDER — CEPHALEXIN 500 MG PO CAPS: 500.0000 mg | ORAL_CAPSULE | Freq: Three times a day (TID) | ORAL | Status: DC

## 2016-01-05 NOTE — Discharge Instructions (Signed)

## 2016-01-05 NOTE — ED Notes (Signed)
Patient ambulatory to restroom with steady gait.

## 2016-01-05 NOTE — ED Notes (Signed)
Patient left at this time with all belongings. 

## 2016-01-07 LAB — URINE CULTURE

## 2016-01-11 ENCOUNTER — Emergency Department (HOSPITAL_COMMUNITY): Payer: Self-pay

## 2016-01-11 ENCOUNTER — Emergency Department (HOSPITAL_COMMUNITY)
Admission: EM | Admit: 2016-01-11 | Discharge: 2016-01-12 | Disposition: A | Discharge status: Home / Self Care | Payer: Self-pay | Attending: Emergency Medicine | Admitting: Emergency Medicine

## 2016-01-11 ENCOUNTER — Encounter (HOSPITAL_COMMUNITY): Payer: Self-pay

## 2016-01-11 DIAGNOSIS — Z794 Long term (current) use of insulin: Secondary | ICD-10-CM | POA: Insufficient documentation

## 2016-01-11 DIAGNOSIS — E119 Type 2 diabetes mellitus without complications: Secondary | ICD-10-CM | POA: Insufficient documentation

## 2016-01-11 DIAGNOSIS — I10 Essential (primary) hypertension: Secondary | ICD-10-CM | POA: Insufficient documentation

## 2016-01-11 DIAGNOSIS — R109 Unspecified abdominal pain: Secondary | ICD-10-CM | POA: Insufficient documentation

## 2016-01-11 DIAGNOSIS — Z7984 Long term (current) use of oral hypoglycemic drugs: Secondary | ICD-10-CM | POA: Insufficient documentation

## 2016-01-11 DIAGNOSIS — Z7982 Long term (current) use of aspirin: Secondary | ICD-10-CM | POA: Insufficient documentation

## 2016-01-11 DIAGNOSIS — F17210 Nicotine dependence, cigarettes, uncomplicated: Secondary | ICD-10-CM | POA: Insufficient documentation

## 2016-01-11 DIAGNOSIS — N200 Calculus of kidney: Secondary | ICD-10-CM | POA: Insufficient documentation

## 2016-01-11 DIAGNOSIS — N12 Tubulo-interstitial nephritis, not specified as acute or chronic: Secondary | ICD-10-CM | POA: Insufficient documentation

## 2016-01-11 LAB — URINE MICROSCOPIC-ADD ON

## 2016-01-11 LAB — URINALYSIS, ROUTINE W REFLEX MICROSCOPIC
Bilirubin Urine: NEGATIVE
Glucose, UA: 250 mg/dL — AB
Ketones, ur: NEGATIVE mg/dL
NITRITE: NEGATIVE
PH: 7 (ref 5.0–8.0)
Protein, ur: NEGATIVE mg/dL
SPECIFIC GRAVITY, URINE: 1.019 (ref 1.005–1.030)

## 2016-01-11 MED ORDER — HYDROMORPHONE HCL 1 MG/ML IJ SOLN
1.0000 mg | Freq: Once | INTRAMUSCULAR | Status: AC
  Administered 2016-01-12: 1 mg via INTRAVENOUS
  Filled 2016-01-11: qty 1

## 2016-01-11 MED ORDER — ONDANSETRON HCL 4 MG/2ML IJ SOLN
4.0000 mg | Freq: Once | INTRAMUSCULAR | Status: AC
  Administered 2016-01-12: 4 mg via INTRAVENOUS
  Filled 2016-01-11: qty 2

## 2016-01-11 MED ORDER — DEXTROSE 5 % IV SOLN
1.0000 g | Freq: Once | INTRAVENOUS | Status: AC
  Administered 2016-01-12: 1 g via INTRAVENOUS
  Filled 2016-01-11: qty 10

## 2016-01-11 NOTE — ED Provider Notes (Signed)
CSN: [id number]     Arrival date & time 01/11/16  1804 History   First MD Initiated Contact with Patient 01/11/16 2257     Chief Complaint  Patient presents with  . Dysuria  . Flank Pain     (Consider location/radiation/quality/duration/timing/severity/associated sxs/prior Treatment) HPI 55 year old female who presents with dysuria and right flank pain. History of nephrolithiasis, DM, HTN, and recurrent UTI. Was seen in ED 01/04/2016 for left flank pain, dysuria, and urinary frequency. Diagnosed with left sided pyelonephritis, taking keflex and has 3 days left. Last night with severe Right flank pain, left flank pain now resolved. With persistent dysuria, frequency, nausea. No vomiting, fever, chills, night sweats. No hematuria.    Past Medical History  Diagnosis Date  . DEPRESSION, MAJOR, RECURRENT 08/29/2006  . DISORDER, BIPOLAR NOS 09/09/2006  . BACK PAIN, LOW 08/29/2006  . OVARIAN MASS 09/18/2007    Benign ovarian cyst s/p laparotomy for removal   . DMII (diabetes mellitus, type 2) (HCC) 09/19/2010  . Hyperlipidemia 09/19/2010    LDL 78 on 08/2010. Recheck 08/2011.    Marland Kitchen HYPERTENSION, BENIGN SYSTEMIC 08/29/2006    Well controlled on Lopressor and lisinopril. Check BMET 12/2011    . TOBACCO DEPENDENCE 08/29/2006    Patient quit in early months of 2012. Continue to follow for possible relapse.    . Kidney infection   . Urosepsis 09/27/2012   Past Surgical History  Procedure Laterality Date  . Partial hysterectomy      unsure if had cervix removed.    No family history on file. Social History  Substance Use Topics  . Smoking status: Current Every Day Smoker -- 1.00 packs/day    Types: Cigarettes  . Smokeless tobacco: None  . Alcohol Use: No   OB History    No data available     Review of Systems 10/14 systems reviewed and are negative other than those stated in the HPI   Allergies  Tamsulosin  Home Medications   Prior to Admission medications   Medication Sig Start Date  End Date Taking? Authorizing Provider  aspirin 81 MG EC tablet Take 81 mg by mouth daily.     Historical Provider, MD  cephALEXin (KEFLEX) 500 MG capsule Take 1 capsule (500 mg total) by mouth 3 (three) times daily. 01/05/16   Shon Baton, MD  fluconazole (DIFLUCAN) 150 MG tablet Take 1 tab today, take second tab after antibiotics course is finished. Patient not taking: Reported on 12/08/2014 11/19/14   Renee A Kuneff, DO  fluconazole (DIFLUCAN) 150 MG tablet Take 1 tablet (150 mg total) by mouth once. 12/08/14   Bonney Aid, MD  glimepiride (AMARYL) 2 MG tablet Take 1 tablet (2 mg total) by mouth daily before breakfast. Patient not taking: Reported on 12/08/2014 10/28/13   Shelva Majestic, MD  HYDROcodone-acetaminophen (NORCO/VICODIN) 5-325 MG tablet Take 1 tablet by mouth every 6 (six) hours as needed. 01/05/16   Shon Baton, MD  insulin NPH Human (HUMULIN N,NOVOLIN N) 100 UNIT/ML injection Inject 0.1-0.12 mLs (10-12 Units total) into the skin 2 (two) times daily. 12 units q am, 10 units q hs 04/27/14   Reva Bores, MD  insulin regular (HUMULIN R) 100 units/mL injection Inject 0.05 mLs (5 Units total) into the skin 2 (two) times daily before a meal. 04/27/14   Reva Bores, MD  lisinopril (PRINIVIL,ZESTRIL) 10 MG tablet Take 1 tablet (10 mg total) by mouth daily. 12/08/14 12/25/16  Bonney Aid, MD  lisinopril (PRINIVIL,ZESTRIL) 10 MG tablet TAKE 1 TABLET (10 MG TOTAL) BY MOUTH DAILY. 12/26/15   Bonney AidAlyssa A Haney, MD  metFORMIN (GLUCOPHAGE) 500 MG tablet Take 1 tablet (500 mg total) by mouth 2 (two) times daily with a meal. 11/24/15   Bonney AidAlyssa A Haney, MD  Multiple Vitamin (MULTIVITAMIN WITH MINERALS) TABS Take 1 tablet by mouth daily.    Historical Provider, MD  ondansetron (ZOFRAN ODT) 4 MG disintegrating tablet Take 1 tablet (4 mg total) by mouth every 8 (eight) hours as needed for nausea or vomiting. 01/05/16   Shon Batonourtney F Horton, MD  simvastatin (ZOCOR) 20 MG tablet Take 1 tablet (20 mg total) by  mouth daily at 6 PM. 10/12/13   Shelva MajesticStephen O Hunter, MD   BP 131/82 mmHg  Pulse 89  Temp(Src) 98.5 F (36.9 C) (Oral)  Resp 18  SpO2 95% Physical Exam Physical Exam  Nursing note and vitals reviewed. Constitutional: Well developed, well nourished, non-toxic, and in no acute distress Head: Normocephalic and atraumatic.  Mouth/Throat: Oropharynx is clear and moist.  Neck: Normal range of motion. Neck supple.  Cardiovascular: Normal rate and regular rhythm.   Pulmonary/Chest: Effort normal and breath sounds normal.  Abdominal: Soft. There is no tenderness. Right CVA tenderness. No distension. There is no rebound and no guarding.  Musculoskeletal: Normal range of motion.  Neurological: Alert, no facial droop, fluent speech, moves all extremities symmetrically Skin: Skin is warm and dry.  Psychiatric: Cooperative  ED Course  Procedures (including critical care time) Labs Review Labs Reviewed  URINALYSIS, ROUTINE W REFLEX MICROSCOPIC (NOT AT [facility]) - Abnormal; Notable for the following:    APPearance TURBID (*)    Glucose, UA 250 (*)    Hgb urine dipstick MODERATE (*)    Leukocytes, UA LARGE (*)    All other components within normal limits  URINE MICROSCOPIC-ADD ON - Abnormal; Notable for the following:    Squamous Epithelial / LPF 0-5 (*)    Bacteria, UA MANY (*)    All other components within normal limits  CBC WITH DIFFERENTIAL/PLATELET - Abnormal; Notable for the following:    WBC 13.4 (*)    Hemoglobin 15.6 (*)    HCT 46.7 (*)    Neutro Abs 8.3 (*)    All other components within normal limits  URINE CULTURE  BASIC METABOLIC PANEL    Imaging Review No results found. I have personally reviewed and evaluated these images and lab results as part of my medical decision-making.   EKG Interpretation None      MDM   Final diagnoses:  Pyelonephritis   55 year old female who presents with dysuria and right flank pain while getting treatment for left pyelonephritis. Well  appearing, in no acute distress. Afebrile with stable vital signs. UA with blood and WBCs/leukocytes c/f infection. Urine culture last week did not grow out organisms. She has soft and benign abdomen with right CVA tenderness. Will need to perform CT renal stone to evaluate for kidney stone concurrently on right side. Doubt perinephric abscess, given pain on opposite side this time. Pending CBC, BMP. Patient very motivated to go home. If unremarkable blood work and no concomitant kidney stone, may discharge with course of cipro and d/c keflex. Received 1 g ceftriaxone in ED. Signed out to Dr. Wille Glas[place]oldston     Beth Spackman Duo Yama Nielson, MD 01/12/16 [phone]

## 2016-01-11 NOTE — ED Notes (Signed)
Patient was diagnosed with uti last week and taking antibiotic as prescribed. Last night developed increased right flank pain and decreased urination and bladder pressure. Nausea with same

## 2016-01-12 LAB — BASIC METABOLIC PANEL
Anion gap: 7 (ref 5–15)
BUN: 19 mg/dL (ref 6–20)
CHLORIDE: 101 mmol/L (ref 101–111)
CO2: 25 mmol/L (ref 22–32)
Calcium: 9.5 mg/dL (ref 8.9–10.3)
Creatinine, Ser: 1.11 mg/dL — ABNORMAL HIGH (ref 0.44–1.00)
GFR calc Af Amer: >60 mL/min (ref >60)
GFR, EST NON AFRICAN AMERICAN: 56 mL/min — AB (ref >60)
GLUCOSE: 170 mg/dL — AB (ref 65–99)
POTASSIUM: 4.1 mmol/L (ref 3.5–5.1)
Sodium: 133 mmol/L — ABNORMAL LOW (ref 135–145)

## 2016-01-12 LAB — CBC WITH DIFFERENTIAL/PLATELET
BASOS ABS: 0.1 10*3/uL (ref 0.0–0.1)
Basophils Relative: 1 %
EOS PCT: 2 %
Eosinophils Absolute: 0.2 10*3/uL (ref 0.0–0.7)
HEMATOCRIT: 46.7 % — AB (ref 36.0–46.0)
Hemoglobin: 15.6 g/dL — ABNORMAL HIGH (ref 12.0–15.0)
LYMPHS PCT: 29 %
Lymphs Abs: 3.9 10*3/uL (ref 0.7–4.0)
MCH: 31.5 pg (ref 26.0–34.0)
MCHC: 33.4 g/dL (ref 30.0–36.0)
MCV: 94.3 fL (ref 78.0–100.0)
MONO ABS: 0.9 10*3/uL (ref 0.1–1.0)
Monocytes Relative: 7 %
Neutro Abs: 8.3 10*3/uL — ABNORMAL HIGH (ref 1.7–7.7)
Neutrophils Relative %: 61 %
PLATELETS: 249 10*3/uL (ref 150–400)
RBC: 4.95 MIL/uL (ref 3.87–5.11)
RDW: 12.5 % (ref 11.5–15.5)
WBC: 13.4 10*3/uL — ABNORMAL HIGH (ref 4.0–10.5)

## 2016-01-12 MED ORDER — CIPROFLOXACIN HCL 500 MG PO TABS: 500.0000 mg | ORAL_TABLET | Freq: Two times a day (BID) | ORAL | Status: DC

## 2016-01-12 MED ORDER — HYDROCODONE-ACETAMINOPHEN 5-325 MG PO TABS: 1.0000 | ORAL_TABLET | Freq: Four times a day (QID) | ORAL | Status: DC | PRN

## 2016-01-12 NOTE — ED Provider Notes (Addendum)
CT shows no obstructing stone. Pain improved. VS unremarkable. Likely recurrent urinary infection. Pain meds ran out about 12 hours ago. Will give norco, change to cipro. Discussed return precautions and need for f/u with urology and PCP. Discussed checking glucose more frequently due to possible glimepiride and cipro interactions but she states she's no longer on glimepiride  Results for orders placed or performed during the hospital encounter of 01/11/16  Urinalysis, Routine w reflex microscopic (not at [facility])  Result Value Ref Range   Color, Urine YELLOW YELLOW   APPearance TURBID (A) CLEAR   Specific Gravity, Urine 1.019 1.005 - 1.030   pH 7.0 5.0 - 8.0   Glucose, UA 250 (A) NEGATIVE mg/dL   Hgb urine dipstick MODERATE (A) NEGATIVE   Bilirubin Urine NEGATIVE NEGATIVE   Ketones, ur NEGATIVE NEGATIVE mg/dL   Protein, ur NEGATIVE NEGATIVE mg/dL   Nitrite NEGATIVE NEGATIVE   Leukocytes, UA LARGE (A) NEGATIVE  Urine microscopic-add on  Result Value Ref Range   Squamous Epithelial / LPF 0-5 (A) NONE SEEN   WBC, UA TOO NUMEROUS TO COUNT 0 - 5 WBC/hpf   RBC / HPF TOO NUMEROUS TO COUNT 0 - 5 RBC/hpf   Bacteria, UA MANY (A) NONE SEEN  CBC with Differential  Result Value Ref Range   WBC 13.4 (H) 4.0 - 10.5 K/uL   RBC 4.95 3.87 - 5.11 MIL/uL   Hemoglobin 15.6 (H) 12.0 - 15.0 g/dL   HCT 28.446.7 (H) 13.236.0 - 44.046.0 %   MCV 94.3 78.0 - 100.0 fL   MCH 31.5 26.0 - 34.0 pg   MCHC 33.4 30.0 - 36.0 g/dL   RDW 10.212.5 72.511.5 - 36.615.5 %   Platelets 249 150 - 400 K/uL   Neutrophils Relative % 61 %   Neutro Abs 8.3 (H) 1.7 - 7.7 K/uL   Lymphocytes Relative 29 %   Lymphs Abs 3.9 0.7 - 4.0 K/uL   Monocytes Relative 7 %   Monocytes Absolute 0.9 0.1 - 1.0 K/uL   Eosinophils Relative 2 %   Eosinophils Absolute 0.2 0.0 - 0.7 K/uL   Basophils Relative 1 %   Basophils Absolute 0.1 0.0 - 0.1 K/uL  Basic metabolic panel  Result Value Ref Range   Sodium 133 (L) 135 - 145 mmol/L   Potassium 4.1 3.5 - 5.1 mmol/L    Chloride 101 101 - 111 mmol/L   CO2 25 22 - 32 mmol/L   Glucose, Bld 170 (H) 65 - 99 mg/dL   BUN 19 6 - 20 mg/dL   Creatinine, Ser 4.401.11 (H) 0.44 - 1.00 mg/dL   Calcium 9.5 8.9 - 34.710.3 mg/dL   GFR calc non Af Amer 56 (L) >60 mL/min   GFR calc Af Amer >60 >60 mL/min   Anion gap 7 5 - 15   [place] Renal  01/05/2016  CLINICAL DATA:  Acute onset of left flank pain.  Initial encounter. EXAM: RENAL / URINARY TRACT ULTRASOUND COMPLETE COMPARISON:  CT of the abdomen and pelvis from 05/22/2013, and renal ultrasound performed 09/27/2012 FINDINGS: Right Kidney: Length: 11.4 cm. Echogenicity within normal limits. Mild pelvicaliectasis is noted. No mass or hydronephrosis visualized. Stones within the right kidney measure to 0.8 cm in size. Left Kidney: Length: 10.4 cm. Echogenicity within normal limits. Minimal left-sided hydronephrosis is noted. No mass is seen. Stones within the left kidney measure up to 0.7 cm size. Bladder: Appears normal for degree of bladder distention. IMPRESSION: 1. Minimal left-sided hydronephrosis may reflect recurrent distal obstruction. 2.  Nonobstructing bilateral renal stones seen, measuring up to 0.8 cm in size. Electronically Signed   By: Roanna Raider M.D.   On: 01/05/2016 01:02   Ct Renal Stone Study  01/12/2016  CLINICAL DATA:  55 year old female since with history of left-sided pyelonephritis presenting with right flank pain. EXAM: CT ABDOMEN AND PELVIS WITHOUT CONTRAST TECHNIQUE: Multidetector CT imaging of the abdomen and pelvis was performed following the standard protocol without IV contrast. COMPARISON:  CT dated 01/05/2016 FINDINGS: Evaluation of this exam is limited in the absence of intravenous contrast. The visualized lung bases are clear. No intra-abdominal free air or free fluid. Diffuse fatty infiltration of the liver. Cholecystectomy. The pancreas, spleen, and adrenal glands appear unremarkable. There is bilateral renal cortical irregularity and lobularity likely related to  chronic infection and infarct. Small scattered bilateral renal calculi similar to prior study some all which appears to be parenchymal. The largest stone measures 5 mm in the right renal inferior pole collecting system similar to prior study. No obstructing calculus identified. There is no hydronephrosis on either side. No perinephric stranding. The visualized ureters and urinary bladder appear unremarkable. The uterus is anteverted and grossly unremarkable. It is a 3.5 x 1.7 cm hypodense structure in the region of the right adnexa may represent ovarian cyst versus normal ovarian tissue. The All there is moderate stool throughout the colon. No evidence of bowel obstruction or active inflammation. Normal appendix. There is moderate aortoiliac atherosclerotic disease. The abdominal aorta and IVC are grossly unremarkable on this noncontrast study. No portal venous gas identified. There is no adenopathy. The abdominal wall soft tissues appear unremarkable. There is degenerative changes of the spine. No acute fracture. IMPRESSION: Nonobstructing bilateral renal calculi similar to prior study and some of which may be parenchymal. No hydronephrosis or obstructing stone. Bilateral renal parenchymal atrophy and cortical irregularity, likely sequela of chronic infection and infarct. No bowel obstruction or active inflammation.  Normal appendix. Fatty liver. Electronically Signed   By: Elgie Collard M.D.   On: 01/12/2016 01:58   Ct Renal Stone Study  01/05/2016  CLINICAL DATA:  Acute onset of left flank pain and dysuria. Initial encounter. EXAM: CT ABDOMEN AND PELVIS WITHOUT CONTRAST TECHNIQUE: Multidetector CT imaging of the abdomen and pelvis was performed following the standard protocol without IV contrast. COMPARISON:  CT of the abdomen and pelvis performed 05/22/2013, and renal ultrasound performed earlier today at 12:14 a.m. FINDINGS: The visualized lung bases are clear. The liver and spleen are unremarkable in  appearance. The patient is status post cholecystectomy, with clips noted at the gallbladder fossa. The pancreas and adrenal glands are unremarkable. Scattered bilateral renal parenchymal calcifications are seen, with scattered small bilateral renal stones seen. A few small bilateral renal cysts are seen. There is no evidence of hydronephrosis. No obstructing ureteral stones are identified. Mild nonspecific perinephric stranding is noted bilaterally. No free fluid is identified. The small bowel is unremarkable in appearance. The stomach is within normal limits. No acute vascular abnormalities are seen. Mild calcification is seen along the abdominal aorta and its branches. The appendix is normal in caliber, without evidence of appendicitis. The colon is unremarkable in appearance. The bladder is mildly distended and grossly unremarkable. The remaining uterus is unremarkable in appearance. No suspicious adnexal masses are seen. No inguinal lymphadenopathy is seen. No acute osseous abnormalities are identified. Mild underlying facet disease is noted along the lower lumbar spine. IMPRESSION: 1. No acute abnormality seen to explain the patient's symptoms. 2. Nonobstructing small bilateral  renal stones noted. Scattered bilateral renal parenchymal calcifications seen. 3. Few small bilateral renal cysts seen. 4. Mild calcification along the abdominal aorta and its branches. Electronically Signed   By: Roanna Raider M.D.   On: 01/05/2016 02:33      Pricilla Loveless, MD 01/12/16 1610  Pricilla Loveless, MD 01/12/16 Earle Gell

## 2016-01-13 LAB — URINE CULTURE

## 2016-06-03 ENCOUNTER — Encounter (HOSPITAL_COMMUNITY): Payer: Self-pay | Admitting: *Deleted

## 2016-06-03 ENCOUNTER — Emergency Department (HOSPITAL_COMMUNITY)
Admission: EM | Admit: 2016-06-03 | Discharge: 2016-06-03 | Disposition: A | Discharge status: Home / Self Care | Payer: Self-pay | Attending: Emergency Medicine | Admitting: Emergency Medicine

## 2016-06-03 DIAGNOSIS — Z794 Long term (current) use of insulin: Secondary | ICD-10-CM | POA: Insufficient documentation

## 2016-06-03 DIAGNOSIS — N390 Urinary tract infection, site not specified: Secondary | ICD-10-CM | POA: Insufficient documentation

## 2016-06-03 DIAGNOSIS — F17210 Nicotine dependence, cigarettes, uncomplicated: Secondary | ICD-10-CM | POA: Insufficient documentation

## 2016-06-03 DIAGNOSIS — I10 Essential (primary) hypertension: Secondary | ICD-10-CM | POA: Insufficient documentation

## 2016-06-03 DIAGNOSIS — N219 Calculus of lower urinary tract, unspecified: Secondary | ICD-10-CM | POA: Insufficient documentation

## 2016-06-03 DIAGNOSIS — E119 Type 2 diabetes mellitus without complications: Secondary | ICD-10-CM | POA: Insufficient documentation

## 2016-06-03 DIAGNOSIS — N218 Other lower urinary tract calculus: Secondary | ICD-10-CM

## 2016-06-03 DIAGNOSIS — Z7982 Long term (current) use of aspirin: Secondary | ICD-10-CM | POA: Insufficient documentation

## 2016-06-03 LAB — CBC
HEMATOCRIT: 43.5 % (ref 36.0–46.0)
HEMOGLOBIN: 14.9 g/dL (ref 12.0–15.0)
MCH: 31.9 pg (ref 26.0–34.0)
MCHC: 34.3 g/dL (ref 30.0–36.0)
MCV: 93.1 fL (ref 78.0–100.0)
Platelets: 311 10*3/uL (ref 150–400)
RBC: 4.67 MIL/uL (ref 3.87–5.11)
RDW: 12.6 % (ref 11.5–15.5)
WBC: 9.8 10*3/uL (ref 4.0–10.5)

## 2016-06-03 LAB — URINALYSIS, ROUTINE W REFLEX MICROSCOPIC
BILIRUBIN URINE: NEGATIVE
Glucose, UA: 500 mg/dL — AB
Ketones, ur: NEGATIVE mg/dL
Nitrite: NEGATIVE
PH: 6 (ref 5.0–8.0)
Protein, ur: NEGATIVE mg/dL
SPECIFIC GRAVITY, URINE: 1.016 (ref 1.005–1.030)

## 2016-06-03 LAB — COMPREHENSIVE METABOLIC PANEL
ALBUMIN: 3.1 g/dL — AB (ref 3.5–5.0)
ALK PHOS: 131 U/L — AB (ref 38–126)
ALT: 27 U/L (ref 14–54)
ANION GAP: 10 (ref 5–15)
AST: 27 U/L (ref 15–41)
BUN: 14 mg/dL (ref 6–20)
CHLORIDE: 104 mmol/L (ref 101–111)
CO2: 21 mmol/L — AB (ref 22–32)
Calcium: 9.1 mg/dL (ref 8.9–10.3)
Creatinine, Ser: 1.03 mg/dL — ABNORMAL HIGH (ref 0.44–1.00)
GFR calc Af Amer: >60 mL/min (ref >60)
GFR calc non Af Amer: >60 mL/min (ref >60)
GLUCOSE: 268 mg/dL — AB (ref 65–99)
POTASSIUM: 4 mmol/L (ref 3.5–5.1)
SODIUM: 135 mmol/L (ref 135–145)
Total Bilirubin: <0.1 mg/dL — ABNORMAL LOW (ref 0.3–1.2)
Total Protein: 6.8 g/dL (ref 6.5–8.1)

## 2016-06-03 LAB — URINE MICROSCOPIC-ADD ON

## 2016-06-03 MED ORDER — CEPHALEXIN 500 MG PO CAPS: 500.0000 mg | ORAL_CAPSULE | Freq: Four times a day (QID) | ORAL | 0 refills | Status: DC

## 2016-06-03 MED ORDER — HYDROCODONE-ACETAMINOPHEN 5-325 MG PO TABS
1.0000 | ORAL_TABLET | Freq: Once | ORAL | Status: AC
  Administered 2016-06-03: 1 via ORAL
  Filled 2016-06-03: qty 1

## 2016-06-03 MED ORDER — HYDROCODONE-ACETAMINOPHEN 5-325 MG PO TABS: 1.0000 | ORAL_TABLET | ORAL | 0 refills | Status: DC | PRN

## 2016-06-03 NOTE — Discharge Instructions (Signed)
Get plenty of rest, and drink a lot of fluids.  See your primary care doctor for checkup, next week.

## 2016-06-03 NOTE — ED Provider Notes (Signed)
[facility]-EMERGENCY DEPT Provider Note   CSN: [id number] Arrival date & time: 06/03/16  1911     History   Chief Complaint Chief Complaint  Patient presents with  . Back Pain  . Abdominal Pain  . Dizziness    HPI [name] is a 55 y.o. female.  She is here for evaluation of bilateral flank pain related to the groin associated urinary frequency, burning and urinary hesitancy. She denies fever, chills, cough, chest pain, weakness or dizziness. She feels like she may have passed a kidney stone. No recent evaluation, or interventions, by urology. No nausea, vomiting. History of frequent urinary tract infections .There are no other known modifying factors.  HPI  Past Medical History:  Diagnosis Date  . BACK PAIN, LOW 08/29/2006  . DEPRESSION, MAJOR, RECURRENT 08/29/2006  . DISORDER, BIPOLAR NOS 09/09/2006  . DMII (diabetes mellitus, type 2) (HCC) 09/19/2010  . Hyperlipidemia 09/19/2010   LDL 78 on 08/2010. Recheck 08/2011.    Marland Kitchen. HYPERTENSION, BENIGN SYSTEMIC 08/29/2006   Well controlled on Lopressor and lisinopril. Check BMET 12/2011    . Kidney infection   . OVARIAN MASS 09/18/2007   Benign ovarian cyst s/p laparotomy for removal   . TOBACCO DEPENDENCE 08/29/2006   Patient quit in early months of 2012. Continue to follow for possible relapse.    . Urosepsis 09/27/2012    Patient Active Problem List   Diagnosis Date Noted  . Health care maintenance 03/31/2015  . Yeast infection 12/08/2014  . Recurrent kidney stones 10/03/2012  . Pyelonephritis 09/27/2012  . AKI (acute kidney injury) (HCC) 11/12/2011  . Obesity, Class III, BMI 40-49.9 (morbid obesity) (HCC) 11/08/2011  . Right knee pain 03/19/2011  . DMII (diabetes mellitus, type 2) (HCC) 09/19/2010  . Hyperlipidemia 09/19/2010  . DISORDER, BIPOLAR NOS 09/09/2006  . DEPRESSION, MAJOR, RECURRENT 08/29/2006  . TOBACCO DEPENDENCE 08/29/2006  . HYPERTENSION, BENIGN SYSTEMIC 08/29/2006  . VENOUS INSUFFICIENCY, CHRONIC 08/29/2006     Past Surgical History:  Procedure Laterality Date  . PARTIAL HYSTERECTOMY     unsure if had cervix removed.     OB History    No data available       Home Medications    Prior to Admission medications   Medication Sig Start Date End Date Taking? Authorizing Provider  aspirin 81 MG EC tablet Take 81 mg by mouth daily.    Yes Historical Provider, MD  insulin NPH Human (HUMULIN N,NOVOLIN N) 100 UNIT/ML injection Inject 0.1-0.12 mLs (10-12 Units total) into the skin 2 (two) times daily. 12 units q am, 10 units q hs Patient taking differently: Inject 10-12 Units into the skin See admin instructions. 12 units q am, 10 units q hs 04/27/14  Yes Reva Boresanya S Pratt, MD  insulin regular (HUMULIN R) 100 units/mL injection Inject 0.05 mLs (5 Units total) into the skin 2 (two) times daily before a meal. 04/27/14  Yes Reva Boresanya S Pratt, MD  lisinopril (PRINIVIL,ZESTRIL) 10 MG tablet Take 1 tablet (10 mg total) by mouth daily. 12/08/14 12/25/16 Yes Alyssa A Kennon RoundsHaney, MD  metFORMIN (GLUCOPHAGE) 500 MG tablet Take 1 tablet (500 mg total) by mouth 2 (two) times daily with a meal. 11/24/15  Yes Alyssa A Haney, MD  Multiple Vitamin (MULTIVITAMIN WITH MINERALS) TABS Take 1 tablet by mouth daily.   Yes Historical Provider, MD  cephALEXin (KEFLEX) 500 MG capsule Take 1 capsule (500 mg total) by mouth 4 (four) times daily. 06/03/16   Mancel BaleElliott Adriann Thau, MD  HYDROcodone-acetaminophen (NORCO) 5-325 MG  tablet Take 1 tablet by mouth every 4 (four) hours as needed. 06/03/16   Mancel Bale, MD    Family History No family history on file.  Social History Social History  Substance Use Topics  . Smoking status: Current Every Day Smoker    Packs/day: 1.00    Types: Cigarettes  . Smokeless tobacco: Never Used  . Alcohol use Yes     Allergies   Tamsulosin   Review of Systems Review of Systems  All other systems reviewed and are negative.    Physical Exam Updated Vital Signs BP (!) 121/106   Pulse 90   Temp 98.4 F  (36.9 C) (Oral)   Resp 12   Ht 5\' 4"  (1.626 m)   Wt 271 lb 6 oz (123.1 kg)   SpO2 96%   BMI 46.58 kg/m   Physical Exam  Constitutional: She is oriented to person, place, and time. She appears well-developed. No distress.  Morbidly obese  HENT:  Head: Normocephalic and atraumatic.  Eyes: Conjunctivae and EOM are normal. Pupils are equal, round, and reactive to light.  Neck: Normal range of motion and phonation normal. Neck supple.  Cardiovascular: Normal rate and regular rhythm.   Pulmonary/Chest: Effort normal and breath sounds normal. She exhibits no tenderness.  Abdominal: Soft. She exhibits no distension. There is no tenderness. There is no guarding.  Genitourinary:  Genitourinary Comments: Mild bilateral costovertebral angle tenderness with percussion  Musculoskeletal: Normal range of motion.  Neurological: She is alert and oriented to person, place, and time. She exhibits normal muscle tone.  Skin: Skin is warm and dry.  Psychiatric: She has a normal mood and affect. Her behavior is normal. Judgment and thought content normal.  Nursing note and vitals reviewed.    ED Treatments / Results  Labs (all labs ordered are listed, but only abnormal results are displayed) Labs Reviewed  COMPREHENSIVE METABOLIC PANEL - Abnormal; Notable for the following:       Result Value   CO2 21 (*)    Glucose, Bld 268 (*)    Creatinine, Ser 1.03 (*)    Albumin 3.1 (*)    Alkaline Phosphatase 131 (*)    Total Bilirubin <0.1 (*)    All other components within normal limits  URINALYSIS, ROUTINE W REFLEX MICROSCOPIC (NOT AT [facility]) - Abnormal; Notable for the following:    APPearance CLOUDY (*)    Glucose, UA 500 (*)    Hgb urine dipstick MODERATE (*)    Leukocytes, UA LARGE (*)    All other components within normal limits  URINE MICROSCOPIC-ADD ON - Abnormal; Notable for the following:    Squamous Epithelial / LPF 0-5 (*)    Bacteria, UA MANY (*)    All other components within normal  limits  CBC    EKG  EKG Interpretation  Date/Time:  Sunday June 03 2016 20:05:07 EST Ventricular Rate:  101 PR Interval:    QRS Duration: 92 QT Interval:  361 QTC Calculation: 468 R Axis:   57 Text Interpretation:  Sinus tachycardia Low voltage, precordial leads Since last tracing of earlier today No significant change was found Confirmed by Effie Shy  MD, Asianae Minkler ([id number]) on 06/03/2016 8:49:47 PM       Radiology No results found.  Procedures Procedures (including critical care time)  Medications Ordered in ED Medications  HYDROcodone-acetaminophen (NORCO/VICODIN) 5-325 MG per tablet 1 tablet (1 tablet Oral Given 06/03/16 2208)     Initial Impression / Assessment and Plan / ED Course  I have reviewed the triage vital signs and the nursing notes.  Pertinent labs & imaging results that were available during my care of the patient were reviewed by me and considered in my medical decision making (see chart for details).  Clinical Course     Medications  HYDROcodone-acetaminophen (NORCO/VICODIN) 5-325 MG per tablet 1 tablet (1 tablet Oral Given 06/03/16 2208)    Patient Vitals for the past 24 hrs:  BP Temp Temp src Pulse Resp SpO2 Height Weight  06/03/16 2230 (!) 121/106 - - 90 12 96 % - -  06/03/16 2215 122/64 - - 90 17 97 % - -  06/03/16 2145 116/58 - - 93 (!) 28 94 % - -  06/03/16 2134 115/82 - - 93 14 94 % - -  06/03/16 2100 124/87 - - 94 21 96 % - -  06/03/16 2015 131/55 - - 95 14 95 % - -  06/03/16 1945 143/76 - - 100 - 95 % - -  06/03/16 1916 - - - - - - 5\' 4"  (1.626 m) 271 lb 6 oz (123.1 kg)  06/03/16 1915 142/82 98.4 F (36.9 C) Oral 116 16 99 % - -    At D/C Reevaluation with update and discussion. After initial assessment and treatment, an updated evaluation reveals She is comfortable, no further complaints, findings discussed with patient and all questions answered. Janiya Millirons L    Final Clinical Impressions(s) / ED Diagnoses   Final diagnoses:    Urinary tract infection without hematuria, site unspecified  Calculus of other lower urinary tract location    Evaluation consistent with UTI. Doubt obstructing kidney stone. At this time. Doubt sepsis, metabolic instability or impending vascular collapse.  Nursing Notes Reviewed/ Care Coordinated Applicable Imaging Reviewed Interpretation of Laboratory Data incorporated into ED treatment  The patient appears reasonably screened and/or stabilized for discharge and I doubt any other medical condition or other [facility] requiring further screening, evaluation, or treatment in the ED at this time prior to discharge.  Plan: Home Medications- continue; Home Treatments- rest, fluids; return here if the recommended treatment, does not improve the symptoms; Recommended follow up- PCP prn   New Prescriptions Discharge Medication List as of 06/03/2016 11:13 PM    START taking these medications   Details  HYDROcodone-acetaminophen (NORCO) 5-325 MG tablet Take 1 tablet by mouth every 4 (four) hours as needed., Starting Sun 06/03/2016, Print    cephALEXin (KEFLEX) 500 MG capsule Take 1 capsule (500 mg total) by mouth 4 (four) times daily., Starting Sun 06/03/2016, Print         Mancel BaleElliott Shuntay Everetts, MD 06/04/16 0021

## 2016-06-03 NOTE — ED Triage Notes (Signed)
Pt c/o lower back pain x 1 week with bilateral flank and lower abdominal pain. Endorses nause, no vomiting. Last BM today. Reports pain increases after bowel movements.

## 2016-09-30 ENCOUNTER — Emergency Department (HOSPITAL_COMMUNITY): Payer: Self-pay

## 2016-09-30 ENCOUNTER — Encounter (HOSPITAL_COMMUNITY): Payer: Self-pay | Admitting: Emergency Medicine

## 2016-09-30 ENCOUNTER — Inpatient Hospital Stay (HOSPITAL_COMMUNITY)
Admission: EM | Admit: 2016-09-30 | Discharge: 2016-10-03 | DRG: 690 | Disposition: A | Discharge status: Home / Self Care | Payer: Self-pay | Attending: Internal Medicine | Admitting: Internal Medicine

## 2016-09-30 DIAGNOSIS — E669 Obesity, unspecified: Secondary | ICD-10-CM

## 2016-09-30 DIAGNOSIS — Z7982 Long term (current) use of aspirin: Secondary | ICD-10-CM

## 2016-09-30 DIAGNOSIS — E1169 Type 2 diabetes mellitus with other specified complication: Secondary | ICD-10-CM

## 2016-09-30 DIAGNOSIS — E0865 Diabetes mellitus due to underlying condition with hyperglycemia: Secondary | ICD-10-CM

## 2016-09-30 DIAGNOSIS — N12 Tubulo-interstitial nephritis, not specified as acute or chronic: Principal | ICD-10-CM

## 2016-09-30 DIAGNOSIS — N200 Calculus of kidney: Secondary | ICD-10-CM

## 2016-09-30 DIAGNOSIS — E785 Hyperlipidemia, unspecified: Secondary | ICD-10-CM | POA: Diagnosis present

## 2016-09-30 DIAGNOSIS — I129 Hypertensive chronic kidney disease with stage 1 through stage 4 chronic kidney disease, or unspecified chronic kidney disease: Secondary | ICD-10-CM | POA: Diagnosis present

## 2016-09-30 DIAGNOSIS — Z6841 Body Mass Index (BMI) 40.0 and over, adult: Secondary | ICD-10-CM

## 2016-09-30 DIAGNOSIS — N179 Acute kidney failure, unspecified: Secondary | ICD-10-CM | POA: Diagnosis present

## 2016-09-30 DIAGNOSIS — Z888 Allergy status to other drugs, medicaments and biological substances status: Secondary | ICD-10-CM

## 2016-09-30 DIAGNOSIS — N182 Chronic kidney disease, stage 2 (mild): Secondary | ICD-10-CM

## 2016-09-30 DIAGNOSIS — E871 Hypo-osmolality and hyponatremia: Secondary | ICD-10-CM

## 2016-09-30 DIAGNOSIS — E118 Type 2 diabetes mellitus with unspecified complications: Secondary | ICD-10-CM

## 2016-09-30 DIAGNOSIS — E1122 Type 2 diabetes mellitus with diabetic chronic kidney disease: Secondary | ICD-10-CM | POA: Diagnosis present

## 2016-09-30 DIAGNOSIS — Z23 Encounter for immunization: Secondary | ICD-10-CM

## 2016-09-30 DIAGNOSIS — E1165 Type 2 diabetes mellitus with hyperglycemia: Secondary | ICD-10-CM | POA: Diagnosis present

## 2016-09-30 DIAGNOSIS — Z79899 Other long term (current) drug therapy: Secondary | ICD-10-CM

## 2016-09-30 DIAGNOSIS — I10 Essential (primary) hypertension: Secondary | ICD-10-CM

## 2016-09-30 DIAGNOSIS — Z794 Long term (current) use of insulin: Secondary | ICD-10-CM

## 2016-09-30 DIAGNOSIS — F17210 Nicotine dependence, cigarettes, uncomplicated: Secondary | ICD-10-CM | POA: Diagnosis present

## 2016-09-30 DIAGNOSIS — Z90710 Acquired absence of both cervix and uterus: Secondary | ICD-10-CM

## 2016-09-30 LAB — URINALYSIS, ROUTINE W REFLEX MICROSCOPIC
Bilirubin Urine: NEGATIVE
Ketones, ur: NEGATIVE mg/dL
NITRITE: POSITIVE — AB
PH: 5 (ref 5.0–8.0)
Protein, ur: 30 mg/dL — AB
SPECIFIC GRAVITY, URINE: 1.015 (ref 1.005–1.030)

## 2016-09-30 LAB — BASIC METABOLIC PANEL
Anion gap: 11 (ref 5–15)
BUN: 15 mg/dL (ref 6–20)
CHLORIDE: 101 mmol/L (ref 101–111)
CO2: 21 mmol/L — AB (ref 22–32)
CREATININE: 1.06 mg/dL — AB (ref 0.44–1.00)
Calcium: 9.5 mg/dL (ref 8.9–10.3)
GFR calc non Af Amer: 58 mL/min — ABNORMAL LOW (ref >60)
Glucose, Bld: 279 mg/dL — ABNORMAL HIGH (ref 65–99)
Potassium: 4.4 mmol/L (ref 3.5–5.1)
SODIUM: 133 mmol/L — AB (ref 135–145)

## 2016-09-30 LAB — CBG MONITORING, ED: Glucose-Capillary: 180 mg/dL — ABNORMAL HIGH (ref 65–99)

## 2016-09-30 LAB — CBC
HCT: 43.6 % (ref 36.0–46.0)
Hemoglobin: 15 g/dL (ref 12.0–15.0)
MCH: 31.4 pg (ref 26.0–34.0)
MCHC: 34.4 g/dL (ref 30.0–36.0)
MCV: 91.4 fL (ref 78.0–100.0)
PLATELETS: 312 10*3/uL (ref 150–400)
RBC: 4.77 MIL/uL (ref 3.87–5.11)
RDW: 12.9 % (ref 11.5–15.5)
WBC: 11.1 10*3/uL — ABNORMAL HIGH (ref 4.0–10.5)

## 2016-09-30 MED ORDER — ACETAMINOPHEN 325 MG PO TABS: 650.0000 mg | ORAL_TABLET | Freq: Four times a day (QID) | ORAL | Status: DC | PRN

## 2016-09-30 MED ORDER — SODIUM CHLORIDE 0.9 % IV SOLN
INTRAVENOUS | Status: DC
  Administered 2016-09-30: 23:00:00 via INTRAVENOUS

## 2016-09-30 MED ORDER — MORPHINE SULFATE (PF) 4 MG/ML IV SOLN
4.0000 mg | Freq: Once | INTRAVENOUS | Status: AC
  Administered 2016-09-30: 4 mg via INTRAMUSCULAR
  Filled 2016-09-30: qty 1

## 2016-09-30 MED ORDER — INSULIN ASPART 100 UNIT/ML ~~LOC~~ SOLN
4.0000 [IU] | Freq: Three times a day (TID) | SUBCUTANEOUS | Status: DC
Start: 2016-10-01 — End: 2016-10-01

## 2016-09-30 MED ORDER — ACETAMINOPHEN 650 MG RE SUPP: 650.0000 mg | Freq: Four times a day (QID) | RECTAL | Status: DC | PRN

## 2016-09-30 MED ORDER — ADULT MULTIVITAMIN W/MINERALS CH
1.0000 | ORAL_TABLET | Freq: Every day | ORAL | Status: DC
  Administered 2016-10-01 – 2016-10-03 (×3): 1 via ORAL
  Filled 2016-09-30 (×3): qty 1

## 2016-09-30 MED ORDER — BISACODYL 5 MG PO TBEC
5.0000 mg | DELAYED_RELEASE_TABLET | Freq: Every day | ORAL | Status: DC | PRN
  Filled 2016-09-30: qty 1

## 2016-09-30 MED ORDER — ONDANSETRON HCL 4 MG/2ML IJ SOLN: 4.0000 mg | Freq: Four times a day (QID) | INTRAMUSCULAR | Status: DC | PRN

## 2016-09-30 MED ORDER — ONDANSETRON 4 MG PO TBDP
8.0000 mg | ORAL_TABLET | Freq: Once | ORAL | Status: AC
  Administered 2016-09-30: 8 mg via ORAL
  Filled 2016-09-30: qty 2

## 2016-09-30 MED ORDER — INSULIN ASPART 100 UNIT/ML ~~LOC~~ SOLN
0.0000 [IU] | Freq: Three times a day (TID) | SUBCUTANEOUS | Status: DC
  Administered 2016-10-01 (×2): 3 [IU] via SUBCUTANEOUS
  Administered 2016-10-01 – 2016-10-02 (×2): 5 [IU] via SUBCUTANEOUS
  Administered 2016-10-02 (×2): 2 [IU] via SUBCUTANEOUS
  Administered 2016-10-03 (×2): 3 [IU] via SUBCUTANEOUS

## 2016-09-30 MED ORDER — ASPIRIN EC 81 MG PO TBEC
81.0000 mg | DELAYED_RELEASE_TABLET | Freq: Every day | ORAL | Status: DC
  Administered 2016-10-01 – 2016-10-03 (×3): 81 mg via ORAL
  Filled 2016-09-30 (×3): qty 1

## 2016-09-30 MED ORDER — PIPERACILLIN-TAZOBACTAM 3.375 G IVPB 30 MIN
3.3750 g | Freq: Once | INTRAVENOUS | Status: AC
  Administered 2016-09-30: 3.375 g via INTRAVENOUS
  Filled 2016-09-30: qty 50

## 2016-09-30 MED ORDER — PIPERACILLIN-TAZOBACTAM 3.375 G IVPB
3.3750 g | Freq: Three times a day (TID) | INTRAVENOUS | Status: DC
  Administered 2016-10-01 – 2016-10-02 (×5): 3.375 g via INTRAVENOUS
  Filled 2016-09-30 (×5): qty 50

## 2016-09-30 MED ORDER — HYDRALAZINE HCL 20 MG/ML IJ SOLN
5.0000 mg | INTRAMUSCULAR | Status: DC | PRN
  Filled 2016-09-30: qty 1

## 2016-09-30 MED ORDER — POLYETHYLENE GLYCOL 3350 17 G PO PACK: 17.0000 g | PACK | Freq: Every day | ORAL | Status: DC | PRN

## 2016-09-30 MED ORDER — INSULIN GLARGINE 100 UNIT/ML ~~LOC~~ SOLN
10.0000 [IU] | Freq: Two times a day (BID) | SUBCUTANEOUS | Status: DC
  Administered 2016-09-30 – 2016-10-01 (×3): 10 [IU] via SUBCUTANEOUS
  Filled 2016-09-30 (×4): qty 0.1

## 2016-09-30 MED ORDER — ENOXAPARIN SODIUM 40 MG/0.4ML ~~LOC~~ SOLN
40.0000 mg | SUBCUTANEOUS | Status: DC
  Administered 2016-10-01 – 2016-10-02 (×2): 40 mg via SUBCUTANEOUS
  Filled 2016-09-30 (×4): qty 0.4

## 2016-09-30 MED ORDER — ONDANSETRON HCL 4 MG PO TABS: 4.0000 mg | ORAL_TABLET | Freq: Four times a day (QID) | ORAL | Status: DC | PRN

## 2016-09-30 MED ORDER — INSULIN ASPART 100 UNIT/ML ~~LOC~~ SOLN
0.0000 [IU] | Freq: Every day | SUBCUTANEOUS | Status: DC
Start: 2016-09-30 — End: 2016-10-03
  Administered 2016-10-01: 3 [IU] via SUBCUTANEOUS
  Administered 2016-10-02: 2 [IU] via SUBCUTANEOUS

## 2016-09-30 MED ORDER — MORPHINE SULFATE (PF) 4 MG/ML IV SOLN
2.0000 mg | INTRAVENOUS | Status: DC | PRN
  Administered 2016-09-30 – 2016-10-01 (×2): 2 mg via INTRAVENOUS
  Filled 2016-09-30 (×2): qty 1

## 2016-09-30 MED ORDER — HYDROCODONE-ACETAMINOPHEN 5-325 MG PO TABS
1.0000 | ORAL_TABLET | ORAL | Status: DC | PRN
  Administered 2016-10-01 – 2016-10-03 (×10): 2 via ORAL
  Filled 2016-09-30 (×10): qty 2

## 2016-09-30 NOTE — ED Provider Notes (Signed)
[facility]-EMERGENCY DEPT Provider Note   CSN: [id number] Arrival date & time: 09/30/16  1916     History   Chief Complaint Chief Complaint  Patient presents with  . Flank Pain    HPI [name] is a 56 y.o. female.  HPI  Pt was seen at 2015. Per pt, c/o gradual onset and persistence of constant right and left sided flank "pain" that began 1 week ago. Pt describes the pain as "maybe like my last kidney stone or urine infection." Pain radiates into the right and left sides of her abd.  Has been associated with nausea and dysuria. Denies hematuria, no abd pain, no vomiting/diarrhea, no black or blood in stools, no CP/SOB, no fevers, no rash.    Past Medical History:  Diagnosis Date  . BACK PAIN, LOW 08/29/2006  . DEPRESSION, MAJOR, RECURRENT 08/29/2006  . DISORDER, BIPOLAR NOS 09/09/2006  . DMII (diabetes mellitus, type 2) (HCC) 09/19/2010  . Hyperlipidemia 09/19/2010   LDL 78 on 08/2010. Recheck 08/2011.    Marland Kitchen HYPERTENSION, BENIGN SYSTEMIC 08/29/2006   Well controlled on Lopressor and lisinopril. Check BMET 12/2011    . Kidney infection   . OVARIAN MASS 09/18/2007   Benign ovarian cyst s/p laparotomy for removal   . TOBACCO DEPENDENCE 08/29/2006   Patient quit in early months of 2012. Continue to follow for possible relapse.    . Urosepsis 09/27/2012    Patient Active Problem List   Diagnosis Date Noted  . Health care maintenance 03/31/2015  . Yeast infection 12/08/2014  . Recurrent kidney stones 10/03/2012  . 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 Obesity, Class III, BMI 40-49.9 (morbid obesity) (HCC) 11/08/2011  . Right knee pain 03/19/2011  . DMII (diabetes mellitus, type 2) (HCC) 09/19/2010  . Hyperlipidemia 09/19/2010  . DISORDER, BIPOLAR NOS 09/09/2006  . DEPRESSION, MAJOR, RECURRENT 08/29/2006  . TOBACCO DEPENDENCE 08/29/2006  . HYPERTENSION, BENIGN SYSTEMIC 08/29/2006  . VENOUS INSUFFICIENCY,  CHRONIC 08/29/2006    Past Surgical History:  Procedure Laterality Date  . PARTIAL HYSTERECTOMY     unsure if had cervix removed.   . stents Bilateral    Kidneys    OB History    No data available       Home Medications    Prior to Admission medications   Medication Sig Start Date End Date Taking? Authorizing Provider  aspirin 81 MG EC tablet Take 81 mg by mouth daily.    Yes Historical Provider, MD  insulin NPH Human (HUMULIN N,NOVOLIN N) 100 UNIT/ML injection Inject 0.1-0.12 mLs (10-12 Units total) into the skin 2 (two) times daily. 12 units q am, 10 units q hs Patient taking differently: Inject 10-12 Units into the skin See admin instructions. 12 units q am, 10 units q hs 04/27/14  Yes Reva Bores, MD  insulin regular (HUMULIN R) 100 units/mL injection Inject 0.05 mLs (5 Units total) into the skin 2 (two) times daily before a meal. 04/27/14  Yes Reva Bores, MD  lisinopril (PRINIVIL,ZESTRIL) 10 MG tablet Take 1 tablet (10 mg total) by mouth daily. 12/08/14 12/25/16 Yes Alyssa A Kennon Rounds, MD  metFORMIN (GLUCOPHAGE) 500 MG tablet Take 1 tablet (500 mg total) by mouth 2 (two) times daily with a meal. 11/24/15  Yes Alyssa A Haney, MD  Multiple Vitamin (MULTIVITAMIN WITH MINERALS) TABS Take 1 tablet by mouth daily.   Yes Historical Provider, MD    Family History History reviewed.  No pertinent family history.  Social History Social History  Substance Use Topics  . Smoking status: Current Every Day Smoker    Packs/day: 1.00    Types: Cigarettes  . Smokeless tobacco: Never Used  . Alcohol use Yes     Allergies   Tamsulosin   Review of Systems Review of Systems ROS: Statement: All systems negative except as marked or noted in the HPI; Constitutional: Negative for fever and chills. ; ; Eyes: Negative for eye pain, redness and discharge. ; ; ENMT: Negative for ear pain, hoarseness, nasal congestion, sinus pressure and sore throat. ; ; Cardiovascular: Negative for chest pain,  palpitations, diaphoresis, dyspnea and peripheral edema. ; ; Respiratory: Negative for cough, wheezing and stridor. ; ; Gastrointestinal: +nausea. Negative for vomiting, diarrhea, abdominal pain, blood in stool, hematemesis, jaundice and rectal bleeding. . ; ; Genitourinary: +flank pain, dysuria. Negative for hematuria. ; ; Musculoskeletal: Negative for back pain and neck pain. Negative for swelling and trauma.; ; Skin: Negative for pruritus, rash, abrasions, blisters, bruising and skin lesion.; ; Neuro: Negative for headache, lightheadedness and neck stiffness. Negative for weakness, altered level of consciousness, altered mental status, extremity weakness, paresthesias, involuntary movement, seizure and syncope.       Physical Exam Updated Vital Signs BP 132/71   Pulse (!) 115   Temp 97.6 F (36.4 C) (Oral)   Resp 18   Ht  (1.626 m)   Wt 267 lb (121.1 kg)   SpO2 97%   BMI 45.83 kg/m    Patient Vitals for the past 24 hrs:  BP Temp Temp src Pulse Resp SpO2 Height Weight  09/30/16 2034 - - - (!) 115 - 97 % - -  09/30/16 2033 132/71 - - - - - - -  09/30/16 1927 - - - - - -  (1.626 m) 267 lb (121.1 kg)  09/30/16 1925 117/78 97.6 F (36.4 C) Oral (!) 116 18 96 % - -  09/30/16 1922 - - - - - -  (1.626 m) 268 lb (121.6 kg)     Physical Exam 2020: Physical examination:  Nursing notes reviewed; Vital signs and O2 SAT reviewed;  Constitutional: Well developed, Well nourished, Well hydrated, In no acute distress; Head:  Normocephalic, atraumatic; Eyes: EOMI, PERRL, No scleral icterus; ENMT: Mouth and pharynx normal, Mucous membranes moist; Neck: Supple, Full range of motion, No lymphadenopathy; Cardiovascular: Regular rate and rhythm, No gallop; Respiratory: Breath sounds clear & equal bilaterally, No wheezes.  Speaking full sentences with ease, Normal respiratory effort/excursion; Chest: Nontender, Movement normal; Abdomen: Soft, Nontender, Nondistended, Normal bowel sounds;  Genitourinary: +bilat flank tenderness; Extremities: Pulses normal, No tenderness, No edema, No calf edema or asymmetry.; Neuro: AA&Ox3, Major CN grossly intact.  Speech clear. No gross focal motor or sensory deficits in extremities.; Skin: Color normal, Warm, Dry.   ED Treatments / Results  Labs (all labs ordered are listed, but only abnormal results are displayed)   EKG  EKG Interpretation None       Radiology   Procedures Procedures (including critical care time)  Medications Ordered in ED Medications  piperacillin-tazobactam (ZOSYN) IVPB 3.375 g (not administered)  ondansetron (ZOFRAN-ODT) disintegrating tablet 8 mg (8 mg Oral Given 09/30/16 2039)  morphine 4 MG/ML injection 4 mg (4 mg Intramuscular Given 09/30/16 2038)     Initial Impression / Assessment and Plan / ED Course  I have reviewed the triage vital signs and the nursing notes.  Pertinent labs & imaging results that  were available during my care of the patient were reviewed by me and considered in my medical decision making (see chart for details).  MDM Reviewed: previous chart, nursing note and vitals Reviewed previous: labs Interpretation: labs and CT scan   Results for orders placed or performed during the hospital encounter of 09/30/16  Urinalysis, Routine w reflex microscopic- may I&O cath if menses  Result Value Ref Range   Color, Urine YELLOW YELLOW   APPearance CLOUDY (A) CLEAR   Specific Gravity, Urine 1.015 1.005 - 1.030   pH 5.0 5.0 - 8.0   Glucose, UA >=500 (A) NEGATIVE mg/dL   Hgb urine dipstick MODERATE (A) NEGATIVE   Bilirubin Urine NEGATIVE NEGATIVE   Ketones, ur NEGATIVE NEGATIVE mg/dL   Protein, ur 30 (A) NEGATIVE mg/dL   Nitrite POSITIVE (A) NEGATIVE   Leukocytes, UA LARGE (A) NEGATIVE   RBC / HPF TOO NUMEROUS TO COUNT 0 - 5 RBC/hpf   WBC, UA TOO NUMEROUS TO COUNT 0 - 5 WBC/hpf   Bacteria, UA MANY (A) NONE SEEN   Squamous Epithelial / LPF 0-5 (A) NONE SEEN   Mucous PRESENT     Budding Yeast PRESENT   Basic metabolic panel  Result Value Ref Range   Sodium 133 (L) 135 - 145 mmol/L   Potassium 4.4 3.5 - 5.1 mmol/L   Chloride 101 101 - 111 mmol/L   CO2 21 (L) 22 - 32 mmol/L   Glucose, Bld 279 (H) 65 - 99 mg/dL   BUN 15 6 - 20 mg/dL   Creatinine, Ser 4.09 (H) 0.44 - 1.00 mg/dL   Calcium 9.5 8.9 - 81.1 mg/dL   GFR calc non Af Amer 58 (L) >60 mL/min   GFR calc Af Amer >60 >60 mL/min   Anion gap 11 5 - 15  CBC  Result Value Ref Range   WBC 11.1 (H) 4.0 - 10.5 K/uL   RBC 4.77 3.87 - 5.11 MIL/uL   Hemoglobin 15.0 12.0 - 15.0 g/dL   HCT 91.4 78.2 - 95.6 %   MCV 91.4 78.0 - 100.0 fL   MCH 31.4 26.0 - 34.0 pg   MCHC 34.4 30.0 - 36.0 g/dL   RDW 21.3 08.6 - 57.8 %   Platelets 312 150 - 400 K/uL   Ct Renal Stone Study Result Date: 09/30/2016 CLINICAL DATA:  Bilateral flank pain, history of ovarian mass, renal infection, renal stones and stents. History of partial hysterectomy. EXAM: CT ABDOMEN AND PELVIS WITHOUT CONTRAST TECHNIQUE: Multidetector CT imaging of the abdomen and pelvis was performed following the standard protocol without IV contrast. COMPARISON:  01/12/2016 CT FINDINGS: Evaluation of the solid organs limited by lack of IV contrast. Lower chest: No acute abnormality. Minimal scarring or atelectasis in the right middle lobe Hepatobiliary: Hepatic steatosis. Cholecystectomy. No biliary dilatation or hepatic mass. Pancreas: Unremarkable. No pancreatic ductal dilatation or surrounding inflammatory changes. Spleen: Normal in size without focal abnormality. Adrenals/Urinary Tract: Normal bilateral adrenal glands. Bilateral cortical thinning consistent with scarring. Punctate and small bilateral renal calculi are identified without hydroureteronephrosis, the largest on the right is approximately 6 mm in the lower pole and on the left, 1-2 mm. A trace amount of air is seen within the renal collecting system on the left involving left upper and interpolar renal calices  possibly from prior instrumentation. Emphysematous pyelitis might account for the gas within the collecting system only. No definite intraparenchymal extension of the gas to suggest emphysematous pyelonephritis. Clinical correlation over would prove useful. Stomach/Bowel: Contracted  stomach. Normal small bowel rotation. No bowel obstruction or acute inflammation. Normal appearing appendix. Vascular/Lymphatic: Aortic atherosclerosis. No enlarged abdominal or pelvic lymph nodes. Reproductive: Uterus and bilateral adnexa are unremarkable. Other: No abdominal wall hernia or abnormality. No abdominopelvic ascites. Musculoskeletal: Degenerative disc disease L4-5 with slight anterolisthesis of L4 on L5 grade 1. No acute osseous appearing abnormality. IMPRESSION: 1. Bilateral nephrolithiasis similar to prior CT. Tiny foci of air within the collecting system of the left upper and interpolar kidney may reflect changes from prior recent instrumentation such as urinary stent removal. Alternatively emphysematous pyelitis given air contained within the collecting system without extension into the parenchyma might account for this appearance (on the spectrum of emphysematous pyelonephritis). After discussion with Dr. Clarene Duke, the patient has not had any recent instrumentation but is positive for urinary tract infection. 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 Critical Value/emergent results were called by telephone at the time of interpretation on 09/30/2016 at 9:07 pm to Dr. Samuel Jester , who verbally acknowledged these results. 2. Stable hepatic steatosis.  Status post cholecystectomy. 3. No acute bowel obstruction or inflammation. Electronically Signed   By: Tollie Eth M.D.   On: 09/30/2016 21:08    2115:  BUN/Cr per baseline. Pt and family state pt has not seen a Urologist "in years." No recent instrumentation to account for gas seen within the collecting system. UC obtained. Will tx  with IV zosyn. Dx and testing d/w pt and family.  Questions answered.  Verb understanding, agreeable to admit. T/C to Uro Dr. Vernie Ammons, case discussed, including:  HPI, pertinent PM/SHx, VS/PE, dx testing, ED course and treatment:  Agreeable with ED treatment, no acute surgical issue at this time. T/C to [facility] Dr. Antionette Char, case discussed, including:  HPI, pertinent PM/SHx, VS/PE, dx testing, ED course and treatment:  Agreeable to admit.  Final Clinical Impressions(s) / ED Diagnoses   Final diagnoses:  None    New Prescriptions New Prescriptions   No medications on file     Samuel Jester, DO 10/03/16 1731

## 2016-09-30 NOTE — H&P (Signed)
History and Physical    [name] ZOX:[id number] DOB: 12-13-1960 DOA: 09/30/2016  PCP: Velora Heckler, MD   Patient coming from: Home  Chief Complaint: Flank pain, dysuria   HPI: [name] is a 56 y.o. female with medical history significant for bilateral nephrolithiasis, hypertension, and insulin-dependent diabetes mellitus who now presents the emergency department with severe bilateral flank pain and dysuria. Patient reports that she was in her usual state of health until approximately one week ago when she developed pain in the bilateral flanks with mild nausea and dysuria. Since that time, her pain is continued to worsen and has become intolerable. Patient denies any vomiting or diarrhea and denies fevers or chills. She reports passing some kidney stones a little over a week ago and reports the flank pain to be similar to that. There has been no recent urologic procedures performed. She denies chest pain or palpitations and denies dyspnea or cough. There has been no lightheadedness, headache, change in vision or hearing, or focal numbness or weakness. She endorses dysuria, but denies gross hematuria. She has not attempted any interventions for her symptoms prior to coming in. Prior urine cultures had grown Escherichia coli resistant to ampicillin and fluoroquinolones.  ED Course: Upon arrival to the ED, patient is found to be afebrile, saturating well on room air, tachycardic in the 110s, and with vitals otherwise stable. Chemistry panels notable for mild hyponatremia and a mild elevation in serum creatinine. Serum glucose is noted to be 279. CBC features a mild leukocytosis with WBC 11,100. Urinalysis is highly suggestive of infection with pyuria, positive leukocytes and nitrites, and microscopic hematuria. CT of the abdomen and pelvis was obtained and features bilateral nephrolithiasis which appears similar to prior study, as well as new tiny foci of air in the left upper and  inner polar region consistent with emphysematous pyelitis. Urology was consulted by the ED physician, but advised that as there was no obstruction present and no abscess requiring drainage, the patient could be managed medically with antibiotics. Patient was treated with Zofran, IV morphine, and started on empiric Zosyn after urine culture had been obtained. Tachycardia improved with normal saline infusion and there has been no apparent respiratory distress. Patient will be admitted to the medical surgical unit for ongoing evaluation and management of severe flank pain and dysuria secondary to emphysematous pyelonephritis.  Review of Systems:  All other systems reviewed and apart from HPI, are negative.  Past Medical History:  Diagnosis Date  . BACK PAIN, LOW 08/29/2006  . DEPRESSION, MAJOR, RECURRENT 08/29/2006  . DISORDER, BIPOLAR NOS 09/09/2006  . DMII (diabetes mellitus, type 2) (HCC) 09/19/2010  . Hyperlipidemia 09/19/2010   LDL 78 on 08/2010. Recheck 08/2011.    Marland Kitchen HYPERTENSION, BENIGN SYSTEMIC 08/29/2006   Well controlled on Lopressor and lisinopril. Check BMET 12/2011    . Kidney infection   . OVARIAN MASS 09/18/2007   Benign ovarian cyst s/p laparotomy for removal   . TOBACCO DEPENDENCE 08/29/2006   Patient quit in early months of 2012. Continue to follow for possible relapse.    . Urosepsis 09/27/2012    Past Surgical History:  Procedure Laterality Date  . PARTIAL HYSTERECTOMY     unsure if had cervix removed.   . stents Bilateral    Kidneys     reports that she has been smoking Cigarettes.  She has been smoking about 1.00 pack per day. She has never used smokeless tobacco. She reports that she drinks alcohol. She reports that she  does not use drugs.  Allergies  Allergen Reactions  . Tamsulosin Shortness Of Breath    History reviewed. No pertinent family history.   Prior to Admission medications   Medication Sig Start Date End Date Taking? Authorizing Provider  aspirin 81 MG  EC tablet Take 81 mg by mouth daily.    Yes Historical Provider, MD  insulin NPH Human (HUMULIN N,NOVOLIN N) 100 UNIT/ML injection Inject 0.1-0.12 mLs (10-12 Units total) into the skin 2 (two) times daily. 12 units q am, 10 units q hs Patient taking differently: Inject 10-12 Units into the skin See admin instructions. 12 units q am, 10 units q hs 04/27/14  Yes Reva Bores, MD  insulin regular (HUMULIN R) 100 units/mL injection Inject 0.05 mLs (5 Units total) into the skin 2 (two) times daily before a meal. 04/27/14  Yes Reva Bores, MD  lisinopril (PRINIVIL,ZESTRIL) 10 MG tablet Take 1 tablet (10 mg total) by mouth daily. 12/08/14 12/25/16 Yes Alyssa A Kennon Rounds, MD  metFORMIN (GLUCOPHAGE) 500 MG tablet Take 1 tablet (500 mg total) by mouth 2 (two) times daily with a meal. 11/24/15  Yes Alyssa A Haney, MD  Multiple Vitamin (MULTIVITAMIN WITH MINERALS) TABS Take 1 tablet by mouth daily.   Yes Historical Provider, MD    Physical Exam: Vitals:   09/30/16 1925 09/30/16 1927 09/30/16 2033 09/30/16 2034  BP: 117/78  132/71   Pulse: (!) 116   (!) 115  Resp: 18     Temp: 97.6 F (36.4 C)     TempSrc: Oral     SpO2: 96%   97%  Weight:  121.1 kg (267 lb)    Height:   (1.626 m)        Constitutional: No respiratory distress, obese, in apparent discomfort.  Eyes: PERTLA, lids and conjunctivae normal ENMT: Mucous membranes are moist. Posterior pharynx clear of any exudate or lesions.   Neck: normal, supple, no masses, no thyromegaly Respiratory: clear to auscultation bilaterally, no wheezing, no crackles. Normal respiratory effort.   Cardiovascular: Rate ~110 and regular. No significant JVD. Abdomen: No distension, soft, no masses palpated, bilateral CVA tenderness. Bowel sounds active. Musculoskeletal: no clubbing / cyanosis. No joint deformity upper and lower extremities. Normal muscle tone.  Skin: no significant rashes, lesions, ulcers. Warm, dry, well-perfused. Neurologic: CN 2-12 grossly  intact. Sensation intact, DTR normal. Strength 5/5 in all 4 limbs.  Psychiatric: Alert and oriented x 3. Odd affect. Cooperative.     Labs on Admission: I have personally reviewed following labs and imaging studies  CBC:  Recent Labs Lab 09/30/16 1931  WBC 11.1*  HGB 15.0  HCT 43.6  MCV 91.4  PLT 312   Basic Metabolic Panel:  Recent Labs Lab 09/30/16 1931  NA 133*  K 4.4  CL 101  CO2 21*  GLUCOSE 279*  BUN 15  CREATININE 1.06*  CALCIUM 9.5   GFR: Estimated Creatinine Clearance: 77.9 mL/min (A) (by C-G formula based on SCr of 1.06 mg/dL (H)). Liver Function Tests: No results for input(s): AST, ALT, ALKPHOS, BILITOT, PROT, ALBUMIN in the last 168 hours. No results for input(s): LIPASE, AMYLASE in the last 168 hours. No results for input(s): AMMONIA in the last 168 hours. Coagulation Profile: No results for input(s): INR, PROTIME in the last 168 hours. Cardiac Enzymes: No results for input(s): CKTOTAL, CKMB, CKMBINDEX, TROPONINI in the last 168 hours. BNP (last 3 results) No results for input(s): PROBNP in the last 8760 hours. HbA1C: No results for  input(s): HGBA1C in the last 72 hours. CBG: No results for input(s): GLUCAP in the last 168 hours. Lipid Profile: No results for input(s): CHOL, HDL, LDLCALC, TRIG, CHOLHDL, LDLDIRECT in the last 72 hours. Thyroid Function Tests: No results for input(s): TSH, T4TOTAL, FREET4, T3FREE, THYROIDAB in the last 72 hours. Anemia Panel: No results for input(s): VITAMINB12, FOLATE, FERRITIN, TIBC, IRON, RETICCTPCT in the last 72 hours. Urine analysis:    Component Value Date/Time   COLORURINE YELLOW 09/30/2016 1933   APPEARANCEUR CLOUDY (A) 09/30/2016 1933   LABSPEC 1.015 09/30/2016 1933   PHURINE 5.0 09/30/2016 1933   GLUCOSEU >=500 (A) 09/30/2016 1933   HGBUR MODERATE (A) 09/30/2016 1933   HGBUR large 07/25/2009 1125   BILIRUBINUR NEGATIVE 09/30/2016 1933   BILIRUBINUR NEG 11/19/2014 0856   KETONESUR NEGATIVE  09/30/2016 1933   PROTEINUR 30 (A) 09/30/2016 1933   UROBILINOGEN 0.2 11/19/2014 0856   UROBILINOGEN 1.0 05/22/2013 1659   NITRITE POSITIVE (A) 09/30/2016 1933   LEUKOCYTESUR LARGE (A) 09/30/2016 1933   Sepsis Labs: (procalcitonin:4,lacticidven:4) )No results found for this or any previous visit (from the past 240 hour(s)).   Radiological Exams on Admission: Ct Renal Stone Study  Result Date: 09/30/2016 CLINICAL DATA:  Bilateral flank pain, history of ovarian mass, renal infection, renal stones and stents. History of partial hysterectomy. EXAM: CT ABDOMEN AND PELVIS WITHOUT CONTRAST TECHNIQUE: Multidetector CT imaging of the abdomen and pelvis was performed following the standard protocol without IV contrast. COMPARISON:  01/12/2016 CT FINDINGS: Evaluation of the solid organs limited by lack of IV contrast. Lower chest: No acute abnormality. Minimal scarring or atelectasis in the right middle lobe Hepatobiliary: Hepatic steatosis. Cholecystectomy. No biliary dilatation or hepatic mass. Pancreas: Unremarkable. No pancreatic ductal dilatation or surrounding inflammatory changes. Spleen: Normal in size without focal abnormality. Adrenals/Urinary Tract: Normal bilateral adrenal glands. Bilateral cortical thinning consistent with scarring. Punctate and small bilateral renal calculi are identified without hydroureteronephrosis, the largest on the right is approximately 6 mm in the lower pole and on the left, 1-2 mm. A trace amount of air is seen within the renal collecting system on the left involving left upper and interpolar renal calices possibly from prior instrumentation. Emphysematous pyelitis might account for the gas within the collecting system only. No definite intraparenchymal extension of the gas to suggest emphysematous pyelonephritis. Clinical correlation over would prove useful. Stomach/Bowel: Contracted stomach. Normal small bowel rotation. No bowel obstruction or acute inflammation.  Normal appearing appendix. Vascular/Lymphatic: Aortic atherosclerosis. No enlarged abdominal or pelvic lymph nodes. Reproductive: Uterus and bilateral adnexa are unremarkable. Other: No abdominal wall hernia or abnormality. No abdominopelvic ascites. Musculoskeletal: Degenerative disc disease L4-5 with slight anterolisthesis of L4 on L5 grade 1. No acute osseous appearing abnormality. IMPRESSION: 1. Bilateral nephrolithiasis similar to prior CT. Tiny foci of air within the collecting system of the left upper and interpolar kidney may reflect changes from prior recent instrumentation such as urinary stent removal. Alternatively emphysematous pyelitis given air contained within the collecting system without extension into the parenchyma might account for this appearance (on the spectrum of emphysematous pyelonephritis). After discussion with Dr. Clarene Duke, the patient has not had any recent instrumentation but is positive for urinary tract infection. 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 Critical Value/emergent results were called by telephone at the time of interpretation on 09/30/2016 at 9:07 pm to Dr. Samuel Jester , who verbally acknowledged these results. 2. Stable hepatic steatosis.  Status post cholecystectomy. 3. No acute bowel obstruction or  inflammation. Electronically Signed   By: Tollie Eth M.D.   On: 09/30/2016 21:08    EKG: Not performed, will obtain as appropriate.   Assessment/Plan  1. Emphysematous pyelonephritis, class 1  - Pt presents with severe flank pain and dysuria, noted to have mild leukocytosis and UA consistent with infection  - There has been mild nausea, but no vomiting, and patient appears very uncomfortable, but non-toxic  - CT findings consistent with emphysematous pyelonephritis without obstruction or abscess - ED physician discussed with urology, but without obstruction or abscess, they advise empiric abx and formal consult should the need  arise  - Prior urine cultures had grown E coli resistant to ampicillin and fluoroquinolones  - Urine was sent for culture and she was started on empiric Zosyn  - Continue supportive care with anti-emetics and analgesia  - Follow cultures and clinical response to therapy   2. Nephrolithiasis  - Appears stable on CT, no obstruction   3. Insulin-dependent DM  - A1c was 8.3% in 2016  - Managed at home with metformin, Humulin N 12 units qAM and 10 units qHS, and Humulin R 5 units BID  - Check CBG with meals and qHS  - Start Lantus 10 units BID, Novolog 3 units with meals, with a moderate-intensity Novolog correctional   4. CKD stage II  - SCr is 1.06 on admission, stable from priors  - Lisinopril held on admission given apparent dehydration and soft BP, will be resumed as appropriate   5. Hypertension  - BP a little soft in ED and her lisinopril is held on admission  - Monitor and resume treatment as needed    6. Hyponatremia  - Mild, hypovolemic  - Providing a gentle IVF hydration with NS - Repeat chem panel in am     DVT prophylaxis: sq Lovenox  Code Status: Full  Family Communication: Daughter updated at bedside with patient's permission Disposition Plan: Admit to med-surg Consults called: Urology Admission status: Inpatient    Briscoe Deutscher, MD [facility] Pager [phone]  If 7PM-7AM, please contact night-coverage [email or web address] Password [email or web address]  09/30/2016, 10:01 PM

## 2016-09-30 NOTE — Progress Notes (Signed)
Pharmacy Antibiotic Note  [name] is a 56 y.o. 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  Pharmacy has been consulted for Zosyn dosing. WBC 11.1 Renal function ok.   Plan: -Zosyn 3.375G IV q8h to be infused over 4 hours -Tren WBC, temp, renal function -F/U urine culture for directed therapy   Height:  (162.6 cm) Weight: 267 lb (121.1 kg) IBW/kg (Calculated) : 54.7  Temp (24hrs), Avg:97.6 F (36.4 C), Min:97.6 F (36.4 C), Max:97.6 F (36.4 C)   Recent Labs Lab 09/30/16 1931  WBC 11.1*  CREATININE 1.06*    Estimated Creatinine Clearance: 77.9 mL/min (A) (by C-G formula based on SCr of 1.06 mg/dL (H)).    Allergies  Allergen Reactions  . Tamsulosin Shortness Of Breath    Abran Duke 09/30/2016 10:18 PM

## 2016-09-30 NOTE — ED Triage Notes (Signed)
c/o bil flank pain.  Reports passing a few kidney stones a week ago and thinks she has some more.  Also c/o nausea.  Hx of Kidney stones and bil stents.

## 2016-10-01 LAB — BASIC METABOLIC PANEL
Anion gap: 11 (ref 5–15)
BUN: 18 mg/dL (ref 6–20)
CO2: 24 mmol/L (ref 22–32)
CREATININE: 1.48 mg/dL — AB (ref 0.44–1.00)
Calcium: 9.2 mg/dL (ref 8.9–10.3)
Chloride: 102 mmol/L (ref 101–111)
GFR calc Af Amer: 45 mL/min — ABNORMAL LOW (ref >60)
GFR calc non Af Amer: 39 mL/min — ABNORMAL LOW (ref >60)
GLUCOSE: 354 mg/dL — AB (ref 65–99)
Potassium: 3.9 mmol/L (ref 3.5–5.1)
Sodium: 137 mmol/L (ref 135–145)

## 2016-10-01 LAB — CBC WITH DIFFERENTIAL/PLATELET
Basophils Absolute: 0.1 10*3/uL (ref 0.0–0.1)
Basophils Relative: 1 %
Eosinophils Absolute: 0.2 10*3/uL (ref 0.0–0.7)
Eosinophils Relative: 2 %
HEMATOCRIT: 42.8 % (ref 36.0–46.0)
Hemoglobin: 14.4 g/dL (ref 12.0–15.0)
LYMPHS ABS: 2.4 10*3/uL (ref 0.7–4.0)
LYMPHS PCT: 25 %
MCH: 31 pg (ref 26.0–34.0)
MCHC: 33.6 g/dL (ref 30.0–36.0)
MCV: 92 fL (ref 78.0–100.0)
MONO ABS: 0.8 10*3/uL (ref 0.1–1.0)
MONOS PCT: 8 %
NEUTROS ABS: 6.3 10*3/uL (ref 1.7–7.7)
Neutrophils Relative %: 64 %
Platelets: 319 10*3/uL (ref 150–400)
RBC: 4.65 MIL/uL (ref 3.87–5.11)
RDW: 13.1 % (ref 11.5–15.5)
WBC: 9.8 10*3/uL (ref 4.0–10.5)

## 2016-10-01 LAB — GLUCOSE, CAPILLARY
GLUCOSE-CAPILLARY: 225 mg/dL — AB (ref 65–99)
GLUCOSE-CAPILLARY: 168 mg/dL — AB (ref 65–99)
Glucose-Capillary: 177 mg/dL — ABNORMAL HIGH (ref 65–99)
Glucose-Capillary: 267 mg/dL — ABNORMAL HIGH (ref 65–99)

## 2016-10-01 LAB — HIV ANTIBODY (ROUTINE TESTING W REFLEX): HIV Screen 4th Generation wRfx: NONREACTIVE

## 2016-10-01 MED ORDER — PNEUMOCOCCAL VAC POLYVALENT 25 MCG/0.5ML IJ INJ
0.5000 mL | INJECTION | INTRAMUSCULAR | Status: AC
  Administered 2016-10-03: 0.5 mL via INTRAMUSCULAR
  Filled 2016-10-01 (×2): qty 0.5

## 2016-10-01 MED ORDER — INSULIN ASPART 100 UNIT/ML ~~LOC~~ SOLN
3.0000 [IU] | Freq: Three times a day (TID) | SUBCUTANEOUS | Status: DC
  Administered 2016-10-01 (×3): 3 [IU] via SUBCUTANEOUS

## 2016-10-01 MED ORDER — DIPHENHYDRAMINE HCL 25 MG PO CAPS
25.0000 mg | ORAL_CAPSULE | ORAL | Status: DC | PRN
  Administered 2016-10-02 (×2): 25 mg via ORAL
  Filled 2016-10-01 (×2): qty 1

## 2016-10-01 MED ORDER — SODIUM CHLORIDE 0.9 % IV SOLN
INTRAVENOUS | Status: DC
  Administered 2016-10-01 – 2016-10-02 (×3): via INTRAVENOUS

## 2016-10-01 MED ORDER — DIPHENHYDRAMINE HCL 25 MG PO CAPS
50.0000 mg | ORAL_CAPSULE | Freq: Once | ORAL | Status: AC
  Administered 2016-10-01: 50 mg via ORAL
  Filled 2016-10-01: qty 2

## 2016-10-01 NOTE — Progress Notes (Signed)
PROGRESS NOTE    [name]  ZOX:[id number] DOB: [birth year] DOA: 09/30/2016 PCP: Velora Heckler, MD     Brief Narrative:  [name] is a 56 y.o. female with medical history significant for bilateral nephrolithiasis, hypertension, and insulin-dependent diabetes mellitus who now presents the emergency department with severe bilateral flank pain and dysuria. Urinalysis is highly suggestive of infection with pyuria, positive leukocytes and nitrites, and microscopic hematuria. CT of the abdomen and pelvis was obtained and features bilateral nephrolithiasis which appears similar to prior study, as well as new tiny foci of air in the left upper and inner polar region consistent with emphysematous pyelitis. Urology was consulted by the ED physician, but advised that as there was no obstruction present and no abscess requiring drainage, the patient could be managed medically with antibiotics. She was admitted for IV antibiotic treatment due to emphysematous pyelonephritis.  Assessment & Plan:   Principal Problem:   Emphysematous pyelonephritis Active Problems:   HYPERTENSION, BENIGN SYSTEMIC   DMII (diabetes mellitus, type 2) (HCC)   CKD (chronic kidney disease), stage II   Recurrent kidney stones   Hyponatremia  Emphysematous pyelonephritis - CT findings consistent with emphysematous pyelonephritis without obstruction or abscess - ED physician discussed with urology, but without obstruction or abscess, they advise empiric abx and formal consult should the need arise  - Prior urine cultures had grown E coli resistant to ampicillin and fluoroquinolones  - Urine was sent for culture and she was started on empiric Zosyn   Non obstructive nephrolithiasis  - Appears stable on CT, no obstruction   Insulin-dependent DM  - Managed at home with metformin, Humulin N 12 units qAM and 10 units qHS, and Humulin R 5 units BID  - Lantus 10 units BID, Novolog 3 units with meals, with a  moderate-intensity Novolog correctional   AKI on CKD stage II  -Baseline Cr 1 -IVF, trend BMP   Hypertension  - BP a little soft in ED and her lisinopril is held on admission due to AKI   DVT prophylaxis: lovenox Code Status: full Family Communication: no family at bedside Disposition Plan: pending improvement   Consultants:   None  Procedures:   None  Antimicrobials:   Zosyn    Subjective: Patient continues to have bilateral flank pain, dysuria. She states that her symptoms are mildly improved since admission. She admits to low-grade fevers at home prior to admission. She has been urinating well, denies any chest pain or shortness of breath, no nausea or vomiting or diarrhea.  Objective: Vitals:   09/30/16 2347 10/01/16 0000 10/01/16 0055 10/01/16 0433  BP: 131/85 (!) 121/59 (!) 99/52 (!) 90/55  Pulse: (!) 102 (!) 104 (!) 107 86  Resp:   20   Temp:   99.1 F (37.3 C) 97.9 F (36.6 C)  TempSrc:   Oral Oral  SpO2: 97% 95% 94% 97%  Weight:      Height:        Intake/Output Summary (Last 24 hours) at 10/01/16 1447 Last data filed at 10/01/16 1028  Gross per 24 hour  Intake             1010 ml  Output              700 ml  Net              310 ml   Filed Weights   09/30/16 1922 09/30/16 1927  Weight: 121.6 kg (268 lb) 121.1 kg (267 lb)  Examination:  General exam: Appears calm and comfortable  Respiratory system: Clear to auscultation. Respiratory effort normal. Cardiovascular system: S1 & S2 heard, RRR. No JVD, murmurs, rubs, gallops or clicks. No pedal edema. Gastrointestinal system: Abdomen is nondistended, soft and nontender. No organomegaly or masses felt. Normal bowel sounds heard. Central nervous system: Alert and oriented. No focal neurological deficits. Extremities: Symmetric 5 x 5 power. Skin: No rashes, lesions or ulcers Psychiatry: Judgement and insight appear normal. Mood & affect appropriate.   Data Reviewed: I have personally reviewed  following labs and imaging studies  CBC:  Recent Labs Lab 09/30/16 1931 10/01/16 0307  WBC 11.1* 9.8  NEUTROABS  --  6.3  HGB 15.0 14.4  HCT 43.6 42.8  MCV 91.4 92.0  PLT 312 319   Basic Metabolic Panel:  Recent Labs Lab 09/30/16 1931 10/01/16 0307  NA 133* 137  K 4.4 3.9  CL 101 102  CO2 21* 24  GLUCOSE 279* 354*  BUN 15 18  CREATININE 1.06* 1.48*  CALCIUM 9.5 9.2   GFR: Estimated Creatinine Clearance: 55.8 mL/min (A) (by C-G formula based on SCr of 1.48 mg/dL (H)). Liver Function Tests: No results for input(s): AST, ALT, ALKPHOS, BILITOT, PROT, ALBUMIN in the last 168 hours. No results for input(s): LIPASE, AMYLASE in the last 168 hours. No results for input(s): AMMONIA in the last 168 hours. Coagulation Profile: No results for input(s): INR, PROTIME in the last 168 hours. Cardiac Enzymes: No results for input(s): CKTOTAL, CKMB, CKMBINDEX, TROPONINI in the last 168 hours. BNP (last 3 results) No results for input(s): PROBNP in the last 8760 hours. HbA1C: No results for input(s): HGBA1C in the last 72 hours. CBG:  Recent Labs Lab 09/30/16 2331 10/01/16 0806 10/01/16 1258  GLUCAP 180* 225* 177*   Lipid Profile: No results for input(s): CHOL, HDL, LDLCALC, TRIG, CHOLHDL, LDLDIRECT in the last 72 hours. Thyroid Function Tests: No results for input(s): TSH, T4TOTAL, FREET4, T3FREE, THYROIDAB in the last 72 hours. Anemia Panel: No results for input(s): VITAMINB12, FOLATE, FERRITIN, TIBC, IRON, RETICCTPCT in the last 72 hours. Sepsis Labs: No results for input(s): PROCALCITON, LATICACIDVEN in the last 168 hours.  No results found for this or any previous visit (from the past 240 hour(s)).     Radiology Studies: Ct Renal Stone Study  Result Date: 09/30/2016 CLINICAL DATA:  Bilateral flank pain, history of ovarian mass, renal infection, renal stones and stents. History of partial hysterectomy. EXAM: CT ABDOMEN AND PELVIS WITHOUT CONTRAST TECHNIQUE:  Multidetector CT imaging of the abdomen and pelvis was performed following the standard protocol without IV contrast. COMPARISON:  01/12/2016 CT FINDINGS: Evaluation of the solid organs limited by lack of IV contrast. Lower chest: No acute abnormality. Minimal scarring or atelectasis in the right middle lobe Hepatobiliary: Hepatic steatosis. Cholecystectomy. No biliary dilatation or hepatic mass. Pancreas: Unremarkable. No pancreatic ductal dilatation or surrounding inflammatory changes. Spleen: Normal in size without focal abnormality. Adrenals/Urinary Tract: Normal bilateral adrenal glands. Bilateral cortical thinning consistent with scarring. Punctate and small bilateral renal calculi are identified without hydroureteronephrosis, the largest on the right is approximately 6 mm in the lower pole and on the left, 1-2 mm. A trace amount of air is seen within the renal collecting system on the left involving left upper and interpolar renal calices possibly from prior instrumentation. Emphysematous pyelitis might account for the gas within the collecting system only. No definite intraparenchymal extension of the gas to suggest emphysematous pyelonephritis. Clinical correlation over would prove useful. Stomach/Bowel:  Contracted stomach. Normal small bowel rotation. No bowel obstruction or acute inflammation. Normal appearing appendix. Vascular/Lymphatic: Aortic atherosclerosis. No enlarged abdominal or pelvic lymph nodes. Reproductive: Uterus and bilateral adnexa are unremarkable. Other: No abdominal wall hernia or abnormality. No abdominopelvic ascites. Musculoskeletal: Degenerative disc disease L4-5 with slight anterolisthesis of L4 on L5 grade 1. No acute osseous appearing abnormality. IMPRESSION: 1. Bilateral nephrolithiasis similar to prior CT. Tiny foci of air within the collecting system of the left upper and interpolar kidney may reflect changes from prior recent instrumentation such as urinary stent removal.  Alternatively emphysematous pyelitis given air contained within the collecting system without extension into the parenchyma might account for this appearance (on the spectrum of emphysematous pyelonephritis). After discussion with Dr. Clarene Duke, the patient has not had any recent instrumentation but is positive for urinary tract infection. 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 Critical Value/emergent results were called by telephone at the time of interpretation on 09/30/2016 at 9:07 pm to Dr. Samuel Jester , who verbally acknowledged these results. 2. Stable hepatic steatosis.  Status post cholecystectomy. 3. No acute bowel obstruction or inflammation. Electronically Signed   By: Tollie Eth M.D.   On: 09/30/2016 21:08      Scheduled Meds: . aspirin EC  81 mg Oral Daily  . enoxaparin (LOVENOX) injection  40 mg Subcutaneous Q24H  . insulin aspart  0-15 Units Subcutaneous TID WC  . insulin aspart  0-5 Units Subcutaneous QHS  . insulin aspart  3 Units Subcutaneous TID WC  . insulin glargine  10 Units Subcutaneous BID  . multivitamin with minerals  1 tablet Oral Daily  . piperacillin-tazobactam (ZOSYN)  IV  3.375 g Intravenous Q8H  . [START ON 10/02/2016] pneumococcal 23 valent vaccine  0.5 mL Intramuscular Tomorrow-1000   Continuous Infusions: . sodium chloride 75 mL/hr at 10/01/16 1305     LOS: 1 day    Time spent: 40 minutes   Noralee Stain, DO [facility] [email or web address] Password [facility] 10/01/2016, 2:47 PM

## 2016-10-02 LAB — CBC
HCT: 37.7 % (ref 36.0–46.0)
Hemoglobin: 12.8 g/dL (ref 12.0–15.0)
MCH: 31.2 pg (ref 26.0–34.0)
MCHC: 34 g/dL (ref 30.0–36.0)
MCV: 92 fL (ref 78.0–100.0)
Platelets: 256 10*3/uL (ref 150–400)
RBC: 4.1 MIL/uL (ref 3.87–5.11)
RDW: 12.9 % (ref 11.5–15.5)
WBC: 6.9 10*3/uL (ref 4.0–10.5)

## 2016-10-02 LAB — BASIC METABOLIC PANEL
ANION GAP: 5 (ref 5–15)
BUN: 17 mg/dL (ref 6–20)
CALCIUM: 8.7 mg/dL — AB (ref 8.9–10.3)
CHLORIDE: 105 mmol/L (ref 101–111)
CO2: 25 mmol/L (ref 22–32)
CREATININE: 1.11 mg/dL — AB (ref 0.44–1.00)
GFR calc Af Amer: >60 mL/min (ref >60)
GFR calc non Af Amer: 55 mL/min — ABNORMAL LOW (ref >60)
Glucose, Bld: 294 mg/dL — ABNORMAL HIGH (ref 65–99)
Potassium: 3.7 mmol/L (ref 3.5–5.1)
SODIUM: 135 mmol/L (ref 135–145)

## 2016-10-02 LAB — URINE CULTURE

## 2016-10-02 LAB — HEMOGLOBIN A1C
Hgb A1c MFr Bld: 11.2 % — ABNORMAL HIGH (ref 4.8–5.6)
MEAN PLASMA GLUCOSE: 275 mg/dL

## 2016-10-02 LAB — GLUCOSE, CAPILLARY
GLUCOSE-CAPILLARY: 221 mg/dL — AB (ref 65–99)
GLUCOSE-CAPILLARY: 143 mg/dL — AB (ref 65–99)
Glucose-Capillary: 148 mg/dL — ABNORMAL HIGH (ref 65–99)

## 2016-10-02 MED ORDER — SULFAMETHOXAZOLE-TRIMETHOPRIM 800-160 MG PO TABS
1.0000 | ORAL_TABLET | Freq: Two times a day (BID) | ORAL | Status: DC
  Administered 2016-10-02 – 2016-10-03 (×2): 1 via ORAL
  Filled 2016-10-02 (×2): qty 1

## 2016-10-02 MED ORDER — INSULIN ASPART 100 UNIT/ML ~~LOC~~ SOLN
5.0000 [IU] | Freq: Three times a day (TID) | SUBCUTANEOUS | Status: DC
  Administered 2016-10-02 – 2016-10-03 (×5): 5 [IU] via SUBCUTANEOUS

## 2016-10-02 MED ORDER — INSULIN GLARGINE 100 UNIT/ML ~~LOC~~ SOLN
25.0000 [IU] | Freq: Every day | SUBCUTANEOUS | Status: DC
  Administered 2016-10-03: 25 [IU] via SUBCUTANEOUS
  Filled 2016-10-02: qty 0.25

## 2016-10-02 MED ORDER — INSULIN GLARGINE 100 UNIT/ML ~~LOC~~ SOLN
20.0000 [IU] | Freq: Every day | SUBCUTANEOUS | Status: DC
  Administered 2016-10-02: 20 [IU] via SUBCUTANEOUS
  Filled 2016-10-02: qty 0.2

## 2016-10-02 NOTE — Progress Notes (Signed)
Pharmacy Antibiotic Note  [name] is a 56 y.o. 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  Pharmacy has been consulted for Zosyn dosing. Pt is afebrile and WBCi s WNL. SCr back down to 1.11 today. Dose remains appropriate and cultures are still pending.   Plan: Continue zosyn 3.375g IV Q8H (4 hr inf) F/u renal fxn, C&S, clinical status and de-escalate based on culture results *Pharmacy will sign off as no dose adjustments are anticipated. Thank you for the consult!   Height:  (162.6 cm) Weight: 267 lb (121.1 kg) IBW/kg (Calculated) : 54.7  Temp (24hrs), Avg:98.2 F (36.8 C), Min:97.9 F (36.6 C), Max:98.4 F (36.9 C)   Recent Labs Lab 09/30/16 1931 10/01/16 0307 10/02/16 0406  WBC 11.1* 9.8 6.9  CREATININE 1.06* 1.48* 1.11*    Estimated Creatinine Clearance: 74.4 mL/min (A) (by C-G formula based on SCr of 1.11 mg/dL (H)).    Allergies  Allergen Reactions  . Tamsulosin Shortness Of Breath    Yuriana Gaal, Drake Leach 10/02/2016 8:36 AM

## 2016-10-02 NOTE — Progress Notes (Signed)
Inpatient Diabetes Program Recommendations  AACE/ADA: New Consensus Statement on Inpatient Glycemic Control (2015)  Target Ranges:  Prepandial:   less than 140 mg/dL      Peak postprandial:   less than 180 mg/dL (1-2 hours)      Critically ill patients:  140 - 180 mg/dL   Results for [name], [name] (MRN [id number]) as of 10/02/2016 10:48  Ref. Range 10/01/2016 08:06 10/01/2016 12:58 10/01/2016 17:20 10/01/2016 21:45 10/02/2016 08:02  Glucose-Capillary Latest Ref Range: 65 - 99 mg/dL 119 (H) 147 (H) 829 (H) 267 (H) 221 (H)   Review of Glycemic Control  Diabetes history: DM 2 Outpatient Diabetes medications: NPH 12 units qam, 10 units qpm, Humulin R 5 units BID with meals, Metformin 500 mg BID Current orders for Inpatient glycemic control: Lantus 20 units Daily, Novolog Moderate + Novolog HS scale, Novolog 5 units TID meal coverage  Inpatient Diabetes Program Recommendations:   Fasting glucose over 200 this am with patient receiving 20 units of Lantus. Consider increasing basal insulin to Lantus 25 units Daily. Consider administering an additional 5 units this am.  Thanks,  Christena Deem RN, MSN, [facility] Inpatient Diabetes Coordinator Team Pager [phone] (8a-5p)

## 2016-10-02 NOTE — Progress Notes (Signed)
PROGRESS NOTE    [name]  ZOX:[id number] DOB: 1960/09/22 DOA: 09/30/2016 PCP: Velora Heckler, MD     Brief Narrative:  [name] is a 56 y.o. female with medical history significant for bilateral nephrolithiasis, hypertension, and insulin-dependent diabetes mellitus who now presents the emergency department with severe bilateral flank pain and dysuria. Urinalysis is highly suggestive of infection with pyuria, positive leukocytes and nitrites, and microscopic hematuria. CT of the abdomen and pelvis was obtained and features bilateral nephrolithiasis which appears similar to prior study, as well as new tiny foci of air in the left upper and inner polar region consistent with emphysematous pyelitis. Urology was consulted by the ED physician, but advised that as there was no obstruction present and no abscess requiring drainage, the patient could be managed medically with antibiotics. She was admitted for IV antibiotic treatment due to emphysematous pyelonephritis.  Assessment & Plan:   Principal Problem:   Emphysematous pyelonephritis Active Problems:   HYPERTENSION, BENIGN SYSTEMIC   DMII (diabetes mellitus, type 2) (HCC)   CKD (chronic kidney disease), stage II   Recurrent kidney stones   Hyponatremia  Emphysematous pyelonephritis - CT findings consistent with emphysematous pyelonephritis without obstruction or abscess - ED physician discussed with urology, but without obstruction or abscess, they advise empiric abx and formal consult should the need arise  - Prior urine cultures had grown E coli resistant to ampicillin and fluoroquinolones  - Urine was sent for culture and she was started on empiric Zosyn. Urine culture contaminated. Will switch to bactrim since previous urine culture with E coli has been sensitive.   Non obstructive nephrolithiasis  - Appears stable on CT, no obstruction   Insulin-dependent DM  - Managed at home with metformin, Humulin N 12 units  qAM and 10 units qHS, and Humulin R 5 units BID  - Adjusted insulin for better control today   AKI on CKD stage II  - Baseline Cr 1 - Improved with IVF   Hypertension  - BP a little soft in ED and her lisinopril is held on admission due to AKI   DVT prophylaxis: lovenox Code Status: full Family Communication: no family at bedside Disposition Plan: pending improvement   Consultants:   None  Procedures:   None  Antimicrobials:   Zosyn - stopped   Bactrim >>>   Subjective: Patient continues to have bilateral flank pain, dysuria but they have improved since admission. Afebrile, no chest pain, SOB.   Objective: Vitals:   10/01/16 0433 10/01/16 1541 10/01/16 2149 10/02/16 0511  BP: (!) 90/55 (!) 95/54 (!) 116/54 92/65  Pulse: 86 84 77 72  Resp:  Temp: 97.9 F (36.6 C) 98.4 F (36.9 C) 98.2 F (36.8 C) 97.9 F (36.6 C)  TempSrc: Oral Oral Oral Oral  SpO2: 97% 97% 100% 96%  Weight:      Height:        Intake/Output Summary (Last 24 hours) at 10/02/16 1429 Last data filed at 10/02/16 1235  Gross per 24 hour  Intake             2250 ml  Output             2500 ml  Net             -250 ml   Filed Weights   09/30/16 1922 09/30/16 1927  Weight: 121.6 kg (268 lb) 121.1 kg (267 lb)    Examination:  General exam: Appears calm and comfortable  Respiratory system: Clear to auscultation. Respiratory effort normal. Cardiovascular system: S1 & S2 heard, RRR. No JVD, murmurs, rubs, gallops or clicks. No pedal edema. Gastrointestinal system: Abdomen is nondistended, soft and nontender. No organomegaly or masses felt. Normal bowel sounds heard. Central nervous system: Alert and oriented. No focal neurological deficits. Extremities: Symmetric 5 x 5 power. Skin: No rashes, lesions or ulcers Psychiatry: Judgement and insight appear normal. Mood & affect appropriate.   Data Reviewed: I have personally reviewed following labs and imaging  studies  CBC:  Recent Labs Lab 09/30/16 1931 10/01/16 0307 10/02/16 0406  WBC 11.1* 9.8 6.9  NEUTROABS  --  6.3  --   HGB 15.0 14.4 12.8  HCT 43.6 42.8 37.7  MCV 91.4 92.0 92.0  PLT 312 319 256   Basic Metabolic Panel:  Recent Labs Lab 09/30/16 1931 10/01/16 0307 10/02/16 0406  NA 133* 137 135  K 4.4 3.9 3.7  CL 101 102 105  CO2 21* 24 25  GLUCOSE 279* 354* 294*  BUN CREATININE 1.06* 1.48* 1.11*  CALCIUM 9.5 9.2 8.7*   GFR: Estimated Creatinine Clearance: 74.4 mL/min (A) (by C-G formula based on SCr of 1.11 mg/dL (H)). Liver Function Tests: No results for input(s): AST, ALT, ALKPHOS, BILITOT, PROT, ALBUMIN in the last 168 hours. No results for input(s): LIPASE, AMYLASE in the last 168 hours. No results for input(s): AMMONIA in the last 168 hours. Coagulation Profile: No results for input(s): INR, PROTIME in the last 168 hours. Cardiac Enzymes: No results for input(s): CKTOTAL, CKMB, CKMBINDEX, TROPONINI in the last 168 hours. BNP (last 3 results) No results for input(s): PROBNP in the last 8760 hours. HbA1C:  Recent Labs  10/01/16 0307  HGBA1C 11.2*   CBG:  Recent Labs Lab 10/01/16 1258 10/01/16 1720 10/01/16 2145 10/02/16 0802 10/02/16 1249  GLUCAP 177* 168* 267* 221* 143*   Lipid Profile: No results for input(s): CHOL, HDL, LDLCALC, TRIG, CHOLHDL, LDLDIRECT in the last 72 hours. Thyroid Function Tests: No results for input(s): TSH, T4TOTAL, FREET4, T3FREE, THYROIDAB in the last 72 hours. Anemia Panel: No results for input(s): VITAMINB12, FOLATE, FERRITIN, TIBC, IRON, RETICCTPCT in the last 72 hours. Sepsis Labs: No results for input(s): PROCALCITON, LATICACIDVEN in the last 168 hours.  Recent Results (from the past 240 hour(s))  Urine C&S     Status: Abnormal   Collection Time: 09/30/16  7:33 PM  Result Value Ref Range Status   Specimen Description URINE, RANDOM  Final   Special Requests NONE  Final   Culture MULTIPLE SPECIES  PRESENT, SUGGEST RECOLLECTION (A)  Final   Report Status 10/02/2016 FINAL  Final       Radiology Studies: Ct Renal Stone Study  Result Date: 09/30/2016 CLINICAL DATA:  Bilateral flank pain, history of ovarian mass, renal infection, renal stones and stents. History of partial hysterectomy. EXAM: CT ABDOMEN AND PELVIS WITHOUT CONTRAST TECHNIQUE: Multidetector CT imaging of the abdomen and pelvis was performed following the standard protocol without IV contrast. COMPARISON:  01/12/2016 CT FINDINGS: Evaluation of the solid organs limited by lack of IV contrast. Lower chest: No acute abnormality. Minimal scarring or atelectasis in the right middle lobe Hepatobiliary: Hepatic steatosis. Cholecystectomy. No biliary dilatation or hepatic mass. Pancreas: Unremarkable. No pancreatic ductal dilatation or surrounding inflammatory changes. Spleen: Normal in size without focal abnormality. Adrenals/Urinary Tract: Normal bilateral adrenal glands. Bilateral cortical thinning consistent with scarring. Punctate and small bilateral renal calculi are identified without hydroureteronephrosis, the largest on the right  is approximately 6 mm in the lower pole and on the left, 1-2 mm. A trace amount of air is seen within the renal collecting system on the left involving left upper and interpolar renal calices possibly from prior instrumentation. Emphysematous pyelitis might account for the gas within the collecting system only. No definite intraparenchymal extension of the gas to suggest emphysematous pyelonephritis. Clinical correlation over would prove useful. Stomach/Bowel: Contracted stomach. Normal small bowel rotation. No bowel obstruction or acute inflammation. Normal appearing appendix. Vascular/Lymphatic: Aortic atherosclerosis. No enlarged abdominal or pelvic lymph nodes. Reproductive: Uterus and bilateral adnexa are unremarkable. Other: No abdominal wall hernia or abnormality. No abdominopelvic ascites. Musculoskeletal:  Degenerative disc disease L4-5 with slight anterolisthesis of L4 on L5 grade 1. No acute osseous appearing abnormality. IMPRESSION: 1. Bilateral nephrolithiasis similar to prior CT. Tiny foci of air within the collecting system of the left upper and interpolar kidney may reflect changes from prior recent instrumentation such as urinary stent removal. Alternatively emphysematous pyelitis given air contained within the collecting system without extension into the parenchyma might account for this appearance (on the spectrum of emphysematous pyelonephritis). After discussion with Dr. Clarene Duke, the patient has not had any recent instrumentation but is positive for urinary tract infection. 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 Critical Value/emergent results were called by telephone at the time of interpretation on 09/30/2016 at 9:07 pm to Dr. Samuel Jester , who verbally acknowledged these results. 2. Stable hepatic steatosis.  Status post cholecystectomy. 3. No acute bowel obstruction or inflammation. Electronically Signed   By: Tollie Eth M.D.   On: 09/30/2016 21:08      Scheduled Meds: . aspirin EC  81 mg Oral Daily  . enoxaparin (LOVENOX) injection  40 mg Subcutaneous Q24H  . insulin aspart  0-15 Units Subcutaneous TID WC  . insulin aspart  0-5 Units Subcutaneous QHS  . insulin aspart  5 Units Subcutaneous TID WC  . [START ON 10/03/2016] insulin glargine  25 Units Subcutaneous Daily  . multivitamin with minerals  1 tablet Oral Daily  . pneumococcal 23 valent vaccine  0.5 mL Intramuscular Tomorrow-1000   Continuous Infusions:    LOS: 2 days    Time spent: 30 minutes   Noralee Stain, DO [facility] [email or web address] Password [email or web address] 10/02/2016, 2:29 PM

## 2016-10-03 DIAGNOSIS — E0865 Diabetes mellitus due to underlying condition with hyperglycemia: Secondary | ICD-10-CM

## 2016-10-03 DIAGNOSIS — Z794 Long term (current) use of insulin: Secondary | ICD-10-CM

## 2016-10-03 LAB — CBC
HCT: 39 % (ref 36.0–46.0)
Hemoglobin: 13.1 g/dL (ref 12.0–15.0)
MCH: 31 pg (ref 26.0–34.0)
MCHC: 33.6 g/dL (ref 30.0–36.0)
MCV: 92.2 fL (ref 78.0–100.0)
PLATELETS: 265 10*3/uL (ref 150–400)
RBC: 4.23 MIL/uL (ref 3.87–5.11)
RDW: 12.7 % (ref 11.5–15.5)
WBC: 8.2 10*3/uL (ref 4.0–10.5)

## 2016-10-03 LAB — GLUCOSE, CAPILLARY
GLUCOSE-CAPILLARY: 211 mg/dL — AB (ref 65–99)
GLUCOSE-CAPILLARY: 179 mg/dL — AB (ref 65–99)
Glucose-Capillary: 156 mg/dL — ABNORMAL HIGH (ref 65–99)

## 2016-10-03 LAB — BASIC METABOLIC PANEL
ANION GAP: 6 (ref 5–15)
BUN: 17 mg/dL (ref 6–20)
CALCIUM: 9.3 mg/dL (ref 8.9–10.3)
CO2: 29 mmol/L (ref 22–32)
Chloride: 102 mmol/L (ref 101–111)
Creatinine, Ser: 1.05 mg/dL — ABNORMAL HIGH (ref 0.44–1.00)
GFR calc Af Amer: >60 mL/min (ref >60)
GFR, EST NON AFRICAN AMERICAN: 59 mL/min — AB (ref >60)
GLUCOSE: 148 mg/dL — AB (ref 65–99)
POTASSIUM: 4.1 mmol/L (ref 3.5–5.1)
SODIUM: 137 mmol/L (ref 135–145)

## 2016-10-03 MED ORDER — HYDROCODONE-ACETAMINOPHEN 5-325 MG PO TABS: 2.0000 | ORAL_TABLET | ORAL | 0 refills | Status: DC | PRN

## 2016-10-03 MED ORDER — SULFAMETHOXAZOLE-TRIMETHOPRIM 800-160 MG PO TABS: 1.0000 | ORAL_TABLET | Freq: Two times a day (BID) | ORAL | 0 refills | Status: AC

## 2016-10-03 NOTE — Discharge Summary (Signed)
Physician Discharge Summary  [name] KGM:[id number] DOB: 1961-03-13 DOA: 09/30/2016  PCP: Velora Heckler, MD  Admit date: 09/30/2016 Discharge date: 10/03/2016  Admitted From: Home Disposition:  Home  Recommendations for Outpatient Follow-up:  1. Follow up with PCP in 1-2 weeks 2. Patient will complete 2 weeks course of antibiotics on 10/14/2016   Home Health:None  Equipment/Devices:None   Discharge Condition:fair CODE STATUS:Full code  Diet recommendation: carb modified      Discharge Diagnoses:  Principal Problem:   Emphysematous pyelonephritis   Active Problems:   HYPERTENSION, BENIGN SYSTEMIC   CKD (chronic kidney disease), stage II   Recurrent kidney stones   Hyponatremia   Diabetes mellitus due to underlying condition, uncontrolled, with hyperglycemia, with long-term current use of insulin (HCC)  Brief narrative/ HPI Please refer to admission H&P for details, in brief, 56 y.o.femalewith medical history significant forbilateral nephrolithiasis, hypertension, and insulin-dependent diabetes mellitus who now presents the emergency department with severe bilateral flank pain and dysuria. Urinalysis is highly suggestive of infection with pyuria, positive leukocytes and nitrites, and microscopic hematuria. CT of the abdomen and pelvis was obtained and features bilateral nephrolithiasis which appears similar to prior study, as well as new tiny foci of air in the left upper and inner polar region consistent with emphysematous pyelitis. Urology was consulted by the ED physician, but advised that as there was no obstruction present and no abscess requiring drainage, the patient could be managed medically with antibiotics. She was admitted for IV antibiotic treatment due to emphysematous pyelonephritis.   Hospital course Emphysematous pyelonephritis - CT findings consistent with emphysematous pyelonephritis without obstruction or abscess. -Placed on empiric IV Zosyn as per  urology recommendations to manage medically. Urine culture with mixed growth. - Prior urine cultures had grown E coli resistant to ampicillin and fluoroquinolones . -Patient remains afebrile and flank pain markedly improved. Switched antibiotics to Bactrim based on prior sensitivity and plantar treat for total 2 weeks course. -Prescribe short duration of pain medications.  Patient will be discharged home and follow-up with PCP in 1 week.  Non obstructive nephrolithiasis  - Appears stable on CT.  Insulin-dependent DM  -Insulin adjusted for better blood glucose control while in the hospital. Currently stable. Will be discharged on home insulin regimen and metformin.  AKI on CKD stage II  -Secondary to pyelonephritis and dehydration. -Resolved with IV fluids.  Essential hypertension Resume blood pressure medications.  Morbid obesity Counseled on weight loss and exercise.  Family Communication: no family at bedside Disposition Plan : home   Consultants:   None  Procedures:   None   Discharge Instructions   Allergies as of 10/03/2016      Reactions   Tamsulosin Shortness Of Breath      Medication List    TAKE these medications   aspirin 81 MG EC tablet Take 81 mg by mouth daily.   HYDROcodone-acetaminophen 5-325 MG tablet Commonly known as:  NORCO/VICODIN Take 2 tablets by mouth every 4 (four) hours as needed for moderate pain.   insulin NPH Human 100 UNIT/ML injection Commonly known as:  HUMULIN N,NOVOLIN N Inject 0.1-0.12 mLs (10-12 Units total) into the skin 2 (two) times daily. 12 units q am, 10 units q hs What changed:  when to take this  additional instructions   insulin regular 250 units/2.40mL (100 units/mL) injection Commonly known as:  HUMULIN R Inject 0.05 mLs (5 Units total) into the skin 2 (two) times daily before a meal.   lisinopril 10 MG tablet Commonly  known as:  PRINIVIL,ZESTRIL Take 1 tablet (10 mg total) by mouth daily.    metFORMIN 500 MG tablet Commonly known as:  GLUCOPHAGE Take 1 tablet (500 mg total) by mouth 2 (two) times daily with a meal.   multivitamin with minerals Tabs tablet Take 1 tablet by mouth daily.   sulfamethoxazole-trimethoprim 800-160 MG tablet Commonly known as:  BACTRIM DS,SEPTRA DS Take 1 tablet by mouth every 12 (twelve) hours.      Follow-up Information    Haney,Alyssa, MD. Schedule an appointment as soon as possible for a visit in 1 week(s).   Specialty:  Family Medicine Contact information: [address] [place] [address] [phone]          Allergies  Allergen Reactions  . Tamsulosin Shortness Of Breath        Procedures/Studies: Ct Renal Stone Study  Result Date: 09/30/2016 CLINICAL DATA:  Bilateral flank pain, history of ovarian mass, renal infection, renal stones and stents. History of partial hysterectomy. EXAM: CT ABDOMEN AND PELVIS WITHOUT CONTRAST TECHNIQUE: Multidetector CT imaging of the abdomen and pelvis was performed following the standard protocol without IV contrast. COMPARISON:  01/12/2016 CT FINDINGS: Evaluation of the solid organs limited by lack of IV contrast. Lower chest: No acute abnormality. Minimal scarring or atelectasis in the right middle lobe Hepatobiliary: Hepatic steatosis. Cholecystectomy. No biliary dilatation or hepatic mass. Pancreas: Unremarkable. No pancreatic ductal dilatation or surrounding inflammatory changes. Spleen: Normal in size without focal abnormality. Adrenals/Urinary Tract: Normal bilateral adrenal glands. Bilateral cortical thinning consistent with scarring. Punctate and small bilateral renal calculi are identified without hydroureteronephrosis, the largest on the right is approximately 6 mm in the lower pole and on the left, 1-2 mm. A trace amount of air is seen within the renal collecting system on the left involving left upper and interpolar renal calices possibly from prior instrumentation. Emphysematous  pyelitis might account for the gas within the collecting system only. No definite intraparenchymal extension of the gas to suggest emphysematous pyelonephritis. Clinical correlation over would prove useful. Stomach/Bowel: Contracted stomach. Normal small bowel rotation. No bowel obstruction or acute inflammation. Normal appearing appendix. Vascular/Lymphatic: Aortic atherosclerosis. No enlarged abdominal or pelvic lymph nodes. Reproductive: Uterus and bilateral adnexa are unremarkable. Other: No abdominal wall hernia or abnormality. No abdominopelvic ascites. Musculoskeletal: Degenerative disc disease L4-5 with slight anterolisthesis of L4 on L5 grade 1. No acute osseous appearing abnormality. IMPRESSION: 1. Bilateral nephrolithiasis similar to prior CT. Tiny foci of air within the collecting system of the left upper and interpolar kidney may reflect changes from prior recent instrumentation such as urinary stent removal. Alternatively emphysematous pyelitis given air contained within the collecting system without extension into the parenchyma might account for this appearance (on the spectrum of emphysematous pyelonephritis). After discussion with Dr. Clarene Duke, the patient has not had any recent instrumentation but is positive for urinary tract infection. 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 Critical Value/emergent results were called by telephone at the time of interpretation on 09/30/2016 at 9:07 pm to Dr. Samuel Jester , who verbally acknowledged these results. 2. Stable hepatic steatosis.  Status post cholecystectomy. 3. No acute bowel obstruction or inflammation. Electronically Signed   By: Tollie Eth M.D.   On: 09/30/2016 21:08      Subjective: Has mild bilateral flank pain but improved. Afebrile. No dysuria.  Discharge Exam: Vitals:   10/02/16 2150 10/03/16 0550  BP: (!) 118/56 119/70  Pulse: 81 79  Resp: 18 18  Temp:  98.2 F (36.8 C) 98.2 F (36.8 C)    Vitals:   10/02/16 0511 10/02/16 1450 10/02/16 2150 10/03/16 0550  BP: 92/65 110/63 (!) 118/56 119/70  Pulse: 72 77 81 79  Resp: Temp: 97.9 F (36.6 C) 98 F (36.7 C) 98.2 F (36.8 C) 98.2 F (36.8 C)  TempSrc: Oral Oral Oral Oral  SpO2: 96% 95% 95% 96%  Weight:      Height:        General:Middle aged obese female not in distress HEENT: Moist mucosa, supple neck Chest: Clear to auscultation bilaterally CVS: Normal S1 and S2, no murmurs GI: Soft, nondistended, bowel sounds present, mild bilateral CVA tenderness Musculoskeletal: Warm, no edema      The results of significant diagnostics from this hospitalization (including imaging, microbiology, ancillary and laboratory) are listed below for reference.     Microbiology: Recent Results (from the past 240 hour(s))  Urine C&S     Status: Abnormal   Collection Time: 09/30/16  7:33 PM  Result Value Ref Range Status   Specimen Description URINE, RANDOM  Final   Special Requests NONE  Final   Culture MULTIPLE SPECIES PRESENT, SUGGEST RECOLLECTION (A)  Final   Report Status 10/02/2016 FINAL  Final     Labs: BNP (last 3 results) No results for input(s): BNP in the last 8760 hours. Basic Metabolic Panel:  Recent Labs Lab 09/30/16 1931 10/01/16 0307 10/02/16 0406 10/03/16 0510  NA 133* 137 135 137  K 4.4 3.9 3.7 4.1  CL 101 102 105 102  CO2 21* GLUCOSE 279* 354* 294* 148*  BUN CREATININE 1.06* 1.48* 1.11* 1.05*  CALCIUM 9.5 9.2 8.7* 9.3   Liver Function Tests: No results for input(s): AST, ALT, ALKPHOS, BILITOT, PROT, ALBUMIN in the last 168 hours. No results for input(s): LIPASE, AMYLASE in the last 168 hours. No results for input(s): AMMONIA in the last 168 hours. CBC:  Recent Labs Lab 09/30/16 1931 10/01/16 0307 10/02/16 0406 10/03/16 0510  WBC 11.1* 9.8 6.9 8.2  NEUTROABS  --  6.3  --   --   HGB 15.0 14.4 12.8 13.1  HCT 43.6 42.8 37.7 39.0  MCV 91.4 92.0 92.0  92.2  PLT 312 319 256 265   Cardiac Enzymes: No results for input(s): CKTOTAL, CKMB, CKMBINDEX, TROPONINI in the last 168 hours. BNP: Invalid input(s): POCBNP CBG:  Recent Labs Lab 10/02/16 0802 10/02/16 1249 10/02/16 1723 10/02/16 2153 10/03/16 0751  GLUCAP 221* 143* 148* 211* 156*   D-Dimer No results for input(s): DDIMER in the last 72 hours. Hgb A1c  Recent Labs  10/01/16 0307  HGBA1C 11.2*   Lipid Profile No results for input(s): CHOL, HDL, LDLCALC, TRIG, CHOLHDL, LDLDIRECT in the last 72 hours. Thyroid function studies No results for input(s): TSH, T4TOTAL, T3FREE, THYROIDAB in the last 72 hours.  Invalid input(s): FREET3 Anemia work up No results for input(s): VITAMINB12, FOLATE, FERRITIN, TIBC, IRON, RETICCTPCT in the last 72 hours. Urinalysis    Component Value Date/Time   COLORURINE YELLOW 09/30/2016 1933   APPEARANCEUR CLOUDY (A) 09/30/2016 1933   LABSPEC 1.015 09/30/2016 1933   PHURINE 5.0 09/30/2016 1933   GLUCOSEU >=500 (A) 09/30/2016 1933   HGBUR MODERATE (A) 09/30/2016 1933   HGBUR large 07/25/2009 1125   BILIRUBINUR NEGATIVE 09/30/2016 1933   BILIRUBINUR NEG 11/19/2014 0856   KETONESUR NEGATIVE 09/30/2016 1933   PROTEINUR 30 (A) 09/30/2016 1933   UROBILINOGEN  0.2 11/19/2014 0856   UROBILINOGEN 1.0 05/22/2013 1659   NITRITE POSITIVE (A) 09/30/2016 1933   LEUKOCYTESUR LARGE (A) 09/30/2016 1933   Sepsis Labs Invalid input(s): PROCALCITONIN,  WBC,  LACTICIDVEN Microbiology Recent Results (from the past 240 hour(s))  Urine C&S     Status: Abnormal   Collection Time: 09/30/16  7:33 PM  Result Value Ref Range Status   Specimen Description URINE, RANDOM  Final   Special Requests NONE  Final   Culture MULTIPLE SPECIES PRESENT, SUGGEST RECOLLECTION (A)  Final   Report Status 10/02/2016 FINAL  Final     Time coordinating discharge: Over 30 minutes  SIGNED:   Eddie North, MD  [facility] 10/03/2016, 9:39 AM Pager   If  7PM-7AM, please contact night-coverage [email or web address] Password [email or web address]

## 2016-10-03 NOTE — Discharge Instructions (Signed)
Pyelonephritis, Adult  Pyelonephritis is a kidney infection. The kidneys are organs that help clean your blood by moving waste out of your blood and into your pee (urine). This infection can happen quickly, or it can last for a long time. In most cases, it clears up with treatment and does not cause other problems.  Follow these instructions at home:  Medicines   · Take over-the-counter and prescription medicines only as told by your doctor.  · Take your antibiotic medicine as told by your doctor. Do not stop taking the medicine even if you start to feel better.  General instructions   · Drink enough fluid to keep your pee clear or pale yellow.  · Avoid caffeine, tea, and carbonated drinks.  · Pee (urinate) often. Avoid holding in pee for long periods of time.  · Pee before and after sex.  · After pooping (having a bowel movement), women should wipe from front to back. Use each tissue only once.  · Keep all follow-up visits as told by your doctor. This is important.  Contact a doctor if:  · You do not feel better after 2 days.  · Your symptoms get worse.  · You have a fever.  Get help right away if:  · You cannot take your medicine or drink fluids as told.  · You have chills and shaking.  · You throw up (vomit).  · You have very bad pain in your side (flank) or back.  · You feel very weak or you pass out (faint).  This information is not intended to replace advice given to you by your health care provider. Make sure you discuss any questions you have with your health care provider.  Document Released: 07/26/2004 Document Revised: 11/24/2015 Document Reviewed: 10/11/2014  Elsevier Interactive Patient Education © 2017 [employer]

## 2016-10-03 NOTE — Progress Notes (Signed)
Discharge instructions gone over with patient. Home medications gone over. Prescription given. Follow up appointment to be made. Diet, activity, and reasons to call the doctor discussed. Completing antibiotic therapy discussed. Patient verbalized understanding of instructions.

## 2016-10-17 ENCOUNTER — Ambulatory Visit (INDEPENDENT_AMBULATORY_CARE_PROVIDER_SITE_OTHER): Payer: Self-pay | Admitting: Student

## 2016-10-17 ENCOUNTER — Encounter: Payer: Self-pay | Admitting: Student

## 2016-10-17 VITALS — BP 118/82 | HR 96 | Temp 98.3°F | Wt 269.0 lb

## 2016-10-17 DIAGNOSIS — N12 Tubulo-interstitial nephritis, not specified as acute or chronic: Secondary | ICD-10-CM

## 2016-10-17 DIAGNOSIS — N200 Calculus of kidney: Secondary | ICD-10-CM

## 2016-10-17 DIAGNOSIS — R300 Dysuria: Secondary | ICD-10-CM

## 2016-10-17 LAB — POCT URINALYSIS DIP (MANUAL ENTRY)
Bilirubin, UA: NEGATIVE
Glucose, UA: NEGATIVE mg/dL
Ketones, POC UA: NEGATIVE mg/dL
NITRITE UA: NEGATIVE
PH UA: 6.5 (ref 5.0–8.0)
PROTEIN UA: NEGATIVE mg/dL
SPEC GRAV UA: 1.02 (ref 1.010–1.025)
UROBILINOGEN UA: 0.2 U/dL

## 2016-10-17 LAB — POCT UA - MICROSCOPIC ONLY

## 2016-10-17 MED ORDER — HYDROCODONE-ACETAMINOPHEN 5-325 MG PO TABS: 2.0000 | ORAL_TABLET | ORAL | 0 refills | Status: DC | PRN

## 2016-10-17 NOTE — Progress Notes (Signed)
   Subjective:    Patient ID: Holly G GoodJoaquin MusicOB: Mar 21, 1961, 56 y.o.   MRN: [id number]   CC: hospital follow up  HPI: 56 y/o F with history of bilateral chronic kidney stones presents for follow up of bilateral pyelnephritis  Pyelonephritis - initially treated with rocephin and then discharged with bactrim - she reports completing the course - she feels that the back pain she had with her acute illness has improved on the left but she still has pain on the right with dysuria - she has not had fevers  Kidney stones - urology was consulted while in the hospital and felt she did not need urgent therapy given they were non obstructing - she has been followed but urology in past but felt they did not do much for her - she did pass a stone last week and feels that she may be passing another one now    Smoking status reviewed  Review of Systems  Per HPI, else denies  chest pain, shortness of breath, abdominal pain,     Objective:  BP 118/82   Pulse 96   Temp 98.3 F (36.8 C) (Oral)   Wt 269 lb (122 kg)   SpO2 97%   BMI 46.17 kg/m  Vitals and nursing note reviewed  General: NAD, obese Cardiac: RRR,  Respiratory: CTAB, normal effort MSK: right CVA tenderness, no left CVA tenderness. Skin: warm and dry, no rashes noted Neuro: alert and oriented, no focal deficits   Assessment & Plan:    Emphysematous pyelonephritis Recently admitted for pyelonephritis, now with continued dysuria and right CVA tenderness - repeat UA and UCx - treat empirically with cipro and alter antibiotics as needed - norco x12 tabs given today per request given pain - will follow closely  Recurrent kidney stones Referral to urology for concern of renal stones in the setting of urinary tract infection    Holly Samples A. Kennon Rounds MD, MS Family Medicine Resident PGY-3 Pager [phone]

## 2016-10-17 NOTE — Patient Instructions (Addendum)
Follow up in 1-2 weeks for Diabetes check If you develop worsenig back pain, or fevers go to the ED Follow up as needed

## 2016-10-17 NOTE — Progress Notes (Signed)
Please obtain culture

## 2016-10-18 ENCOUNTER — Telehealth: Payer: Self-pay | Admitting: Student

## 2016-10-18 MED ORDER — CIPROFLOXACIN HCL 500 MG PO TABS: 500.0000 mg | ORAL_TABLET | Freq: Two times a day (BID) | ORAL | 0 refills | Status: DC

## 2016-10-18 NOTE — Assessment & Plan Note (Signed)
Referral to urology for concern of renal stones in the setting of urinary tract infection

## 2016-10-18 NOTE — Telephone Encounter (Signed)
Attempted to contact pt and inform her of UTI and antibiotic to pharmacy. However, no answer, VM left to call the office. Please inform her of urinary tract infection and antibiotic sent to her pharmacy. Thanks!

## 2016-10-18 NOTE — Assessment & Plan Note (Addendum)
Recently admitted for pyelonephritis, now with continued dysuria and right CVA tenderness - repeat UA and UCx - treat empirically with cipro and alter antibiotics as needed - norco x12 tabs given today per request given pain - will follow closely

## 2016-10-18 NOTE — Telephone Encounter (Signed)
Please call the patient to tell her that antibiotics were sent to her pharmacy. Please also make sure she makes an appointment with her urologist for kidney stones

## 2016-10-19 LAB — URINE CULTURE

## 2017-02-15 ENCOUNTER — Encounter (HOSPITAL_COMMUNITY): Payer: Self-pay

## 2017-02-15 ENCOUNTER — Emergency Department (HOSPITAL_COMMUNITY)
Admission: EM | Admit: 2017-02-15 | Discharge: 2017-02-15 | Disposition: A | Discharge status: Home / Self Care | Payer: Self-pay | Attending: Emergency Medicine | Admitting: Emergency Medicine

## 2017-02-15 ENCOUNTER — Emergency Department (HOSPITAL_COMMUNITY): Payer: Self-pay

## 2017-02-15 DIAGNOSIS — Z79899 Other long term (current) drug therapy: Secondary | ICD-10-CM | POA: Insufficient documentation

## 2017-02-15 DIAGNOSIS — F17210 Nicotine dependence, cigarettes, uncomplicated: Secondary | ICD-10-CM | POA: Insufficient documentation

## 2017-02-15 DIAGNOSIS — N10 Acute tubulo-interstitial nephritis: Secondary | ICD-10-CM | POA: Insufficient documentation

## 2017-02-15 DIAGNOSIS — F329 Major depressive disorder, single episode, unspecified: Secondary | ICD-10-CM | POA: Insufficient documentation

## 2017-02-15 DIAGNOSIS — E1122 Type 2 diabetes mellitus with diabetic chronic kidney disease: Secondary | ICD-10-CM | POA: Insufficient documentation

## 2017-02-15 DIAGNOSIS — Z794 Long term (current) use of insulin: Secondary | ICD-10-CM | POA: Insufficient documentation

## 2017-02-15 DIAGNOSIS — N12 Tubulo-interstitial nephritis, not specified as acute or chronic: Secondary | ICD-10-CM

## 2017-02-15 DIAGNOSIS — N182 Chronic kidney disease, stage 2 (mild): Secondary | ICD-10-CM | POA: Insufficient documentation

## 2017-02-15 DIAGNOSIS — I129 Hypertensive chronic kidney disease with stage 1 through stage 4 chronic kidney disease, or unspecified chronic kidney disease: Secondary | ICD-10-CM | POA: Insufficient documentation

## 2017-02-15 DIAGNOSIS — Z7982 Long term (current) use of aspirin: Secondary | ICD-10-CM | POA: Insufficient documentation

## 2017-02-15 LAB — PREGNANCY, URINE: Preg Test, Ur: NEGATIVE

## 2017-02-15 LAB — COMPREHENSIVE METABOLIC PANEL
ALT: 28 U/L (ref 14–54)
AST: 29 U/L (ref 15–41)
Albumin: 3.6 g/dL (ref 3.5–5.0)
Alkaline Phosphatase: 128 U/L — ABNORMAL HIGH (ref 38–126)
Anion gap: 10 (ref 5–15)
BILIRUBIN TOTAL: 0.6 mg/dL (ref 0.3–1.2)
BUN: 16 mg/dL (ref 6–20)
CALCIUM: 9.6 mg/dL (ref 8.9–10.3)
CHLORIDE: 104 mmol/L (ref 101–111)
CO2: 23 mmol/L (ref 22–32)
Creatinine, Ser: 1.11 mg/dL — ABNORMAL HIGH (ref 0.44–1.00)
GFR calc Af Amer: >60 mL/min (ref >60)
GFR calc non Af Amer: 55 mL/min — ABNORMAL LOW (ref >60)
GLUCOSE: 205 mg/dL — AB (ref 65–99)
Potassium: 4.1 mmol/L (ref 3.5–5.1)
Sodium: 137 mmol/L (ref 135–145)
Total Protein: 7.4 g/dL (ref 6.5–8.1)

## 2017-02-15 LAB — URINALYSIS, ROUTINE W REFLEX MICROSCOPIC
BILIRUBIN URINE: NEGATIVE
GLUCOSE, UA: 100 mg/dL — AB
KETONES UR: NEGATIVE mg/dL
Nitrite: NEGATIVE
PROTEIN: NEGATIVE mg/dL
Specific Gravity, Urine: 1.02 (ref 1.005–1.030)
pH: 6.5 (ref 5.0–8.0)

## 2017-02-15 LAB — URINALYSIS, MICROSCOPIC (REFLEX)

## 2017-02-15 LAB — CBC
HCT: 46.5 % — ABNORMAL HIGH (ref 36.0–46.0)
HEMOGLOBIN: 15.9 g/dL — AB (ref 12.0–15.0)
MCH: 31 pg (ref 26.0–34.0)
MCHC: 34.2 g/dL (ref 30.0–36.0)
MCV: 90.6 fL (ref 78.0–100.0)
Platelets: 313 10*3/uL (ref 150–400)
RBC: 5.13 MIL/uL — ABNORMAL HIGH (ref 3.87–5.11)
RDW: 12.4 % (ref 11.5–15.5)
WBC: 12.3 10*3/uL — ABNORMAL HIGH (ref 4.0–10.5)

## 2017-02-15 LAB — I-STAT CG4 LACTIC ACID, ED: Lactic Acid, Venous: 2.23 mmol/L (ref 0.5–1.9)

## 2017-02-15 LAB — I-STAT BETA HCG BLOOD, ED (MC, WL, AP ONLY): HCG, QUANTITATIVE: 8.3 m[IU]/mL — AB (ref <5)

## 2017-02-15 LAB — LIPASE, BLOOD: LIPASE: 47 U/L (ref 11–51)

## 2017-02-15 MED ORDER — HYDROCODONE-ACETAMINOPHEN 5-325 MG PO TABS: 2.0000 | ORAL_TABLET | ORAL | 0 refills | Status: DC | PRN

## 2017-02-15 MED ORDER — SODIUM CHLORIDE 0.9 % IV BOLUS (SEPSIS): 1000.0000 mL | Freq: Once | INTRAVENOUS | Status: DC

## 2017-02-15 MED ORDER — SULFAMETHOXAZOLE-TRIMETHOPRIM 800-160 MG PO TABS: 1.0000 | ORAL_TABLET | Freq: Two times a day (BID) | ORAL | 0 refills | Status: AC

## 2017-02-15 MED ORDER — PIPERACILLIN-TAZOBACTAM 3.375 G IVPB 30 MIN
3.3750 g | Freq: Once | INTRAVENOUS | Status: AC
  Administered 2017-02-15: 3.375 g via INTRAVENOUS
  Filled 2017-02-15: qty 50

## 2017-02-15 MED ORDER — ONDANSETRON HCL 4 MG/2ML IJ SOLN
4.0000 mg | Freq: Once | INTRAMUSCULAR | Status: AC
  Administered 2017-02-15: 4 mg via INTRAVENOUS
  Filled 2017-02-15: qty 2

## 2017-02-15 MED ORDER — MORPHINE SULFATE (PF) 4 MG/ML IV SOLN
4.0000 mg | Freq: Once | INTRAVENOUS | Status: AC
  Administered 2017-02-15: 4 mg via INTRAVENOUS
  Filled 2017-02-15: qty 1

## 2017-02-15 MED ORDER — SODIUM CHLORIDE 0.9 % IV BOLUS (SEPSIS)
1000.0000 mL | Freq: Once | INTRAVENOUS | Status: AC
  Administered 2017-02-15: 1000 mL via INTRAVENOUS

## 2017-02-15 MED ORDER — CEPHALEXIN 500 MG PO CAPS: 500.0000 mg | ORAL_CAPSULE | Freq: Three times a day (TID) | ORAL | 0 refills | Status: DC

## 2017-02-15 NOTE — Discharge Instructions (Signed)
Take keflex three times daily for a week.   Take bactrim twice daily for a week as well.   Take motrin for pain.   Take vicodin for severe pain.   See your doctor in a week for follow up   Return to ER if you have worse abdominal pain, flank pain, vomiting, fevers, dehydration.

## 2017-02-15 NOTE — ED Provider Notes (Signed)
[facility]-EMERGENCY DEPT Provider Note   CSN: [id number] Arrival date & time: 02/15/17  1224     History   Chief Complaint Chief Complaint  Patient presents with  . Flank Pain    HPI Holly VANDERHEIDEN is a 56 y.o. female history of hypertension, diabetes, previous kidney infection with emphysematous pyelonephritis here presenting with bilateral flank pain, chills. Patient states that for the last 3 days she's been having worsening bilateral flank pain more on the left side. Patient states that she has subjective fever today and feel lightheaded and dizzy. Patient has been urinating frequently and has some mild dysuria. Patient states that she was admitted to the hospital in April of this year for kidney infection. She had a CT that showed emphysematous pyelonephritis requiring IV zosyn and she was discharged with bactrim. No urological procedures were done at that time.    The history is provided by the patient.    Past Medical History:  Diagnosis Date  . BACK PAIN, LOW 08/29/2006  . DEPRESSION, MAJOR, RECURRENT 08/29/2006  . DISORDER, BIPOLAR NOS 09/09/2006  . DMII (diabetes mellitus, type 2) (HCC) 09/19/2010  . Hyperlipidemia 09/19/2010   LDL 78 on 08/2010. Recheck 08/2011.    Marland Kitchen HYPERTENSION, BENIGN SYSTEMIC 08/29/2006   Well controlled on Lopressor and lisinopril. Check BMET 12/2011    . Kidney infection   . OVARIAN MASS 09/18/2007   Benign ovarian cyst s/p laparotomy for removal   . TOBACCO DEPENDENCE 08/29/2006   Patient quit in early months of 2012. Continue to follow for possible relapse.    . Urosepsis 09/27/2012    Patient Active Problem List   Diagnosis Date Noted  . Diabetes mellitus due to underlying condition, uncontrolled, with hyperglycemia, with long-term current use of insulin (HCC) 10/03/2016  . Health care maintenance 03/31/2015  . Yeast infection 12/08/2014  . Recurrent kidney stones 10/03/2012  . 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 CKD (chronic kidney  disease), stage II 11/12/2011  . Obesity, Class III, BMI 40-49.9 (morbid obesity) (HCC) 11/08/2011  . Right knee pain 03/19/2011  . DMII (diabetes mellitus, type 2) (HCC) 09/19/2010  . Hyperlipidemia 09/19/2010  . DISORDER, BIPOLAR NOS 09/09/2006  . DEPRESSION, MAJOR, RECURRENT 08/29/2006  . TOBACCO DEPENDENCE 08/29/2006  . HYPERTENSION, BENIGN SYSTEMIC 08/29/2006  . VENOUS INSUFFICIENCY, CHRONIC 08/29/2006    Past Surgical History:  Procedure Laterality Date  . PARTIAL HYSTERECTOMY     unsure if had cervix removed.   . stents Bilateral    Kidneys    OB History    No data available       Home Medications    Prior to Admission medications   Medication Sig Start Date End Date Taking? Authorizing Provider  aspirin 81 MG EC tablet Take 81 mg by mouth daily.    Yes [provider]  HYDROcodone-acetaminophen (NORCO/VICODIN) 5-325 MG tablet Take 2 tablets by mouth every 4 (four) hours as needed for moderate pain. 10/17/16  Yes Haney, Alyssa A, MD  insulin NPH Human (HUMULIN N,NOVOLIN N) 100 UNIT/ML injection Inject 0.1-0.12 mLs (10-12 Units total) into the skin 2 (two) times daily. 12 units q am, 10 units q hs Patient taking differently: Inject 10-12 Units into the skin See admin instructions. 10 units q am, 12 units q hs 04/27/14  Yes Reva Bores, MD  insulin regular (HUMULIN R) 100 units/mL injection Inject 0.05 mLs (5 Units total) into the skin 2 (two) times daily before a meal. Patient taking differently: Inject 8-10 Units into the  skin 2 (two) times daily before a meal.  04/27/14  Yes Reva Bores, MD  Multiple Vitamin (MULTIVITAMIN WITH MINERALS) TABS Take 1 tablet by mouth daily.   Yes [provider]    Family History No family history on file.  Social History Social History  Substance Use Topics  . Smoking status: Current Every Day Smoker    Packs/day: 1.00    Types: Cigarettes  . Smokeless tobacco: Never Used  . Alcohol use Yes      Allergies   Tamsulosin   Review of Systems Review of Systems  Gastrointestinal: Positive for abdominal pain.  Genitourinary: Positive for flank pain.  All other systems reviewed and are negative.    Physical Exam Updated Vital Signs BP (!) 124/55   Pulse 90   Temp 98 F (36.7 C) (Oral)   Resp 18   Ht 5\' 3"  (1.6 m)   Wt 122.5 kg (270 lb)   SpO2 97%   BMI 47.83 kg/m   Physical Exam  Constitutional: She is oriented to person, place, and time.  Slightly uncomfortable   HENT:  Head: Normocephalic.  MM slightly dry   Eyes: Pupils are equal, round, and reactive to light. Conjunctivae and EOM are normal.  Neck: Normal range of motion. Neck supple.  Cardiovascular: Regular rhythm and normal heart sounds.   Slightly tachycardic   Pulmonary/Chest: Effort normal and breath sounds normal. No respiratory distress. She has no wheezes. She has no rales.  Abdominal: Soft. Bowel sounds are normal. She exhibits no distension. There is no tenderness.  + bilateral CVAT   Musculoskeletal: Normal range of motion.  Neurological: She is alert and oriented to person, place, and time.  Skin: Skin is warm.  Psychiatric: She has a normal mood and affect.  Nursing note and vitals reviewed.    ED Treatments / Results  Labs (all labs ordered are listed, but only abnormal results are displayed) Labs Reviewed  URINALYSIS, ROUTINE W REFLEX MICROSCOPIC - Abnormal; Notable for the following:       Result Value   APPearance CLOUDY (*)    Glucose, UA 100 (*)    Hgb urine dipstick TRACE (*)    Leukocytes, UA LARGE (*)    All other components within normal limits  COMPREHENSIVE METABOLIC PANEL - Abnormal; Notable for the following:    Glucose, Bld 205 (*)    Creatinine, Ser 1.11 (*)    Alkaline Phosphatase 128 (*)    GFR calc non Af Amer 55 (*)    All other components within normal limits  CBC - Abnormal; Notable for the following:    WBC 12.3 (*)    RBC 5.13 (*)    Hemoglobin 15.9  (*)    HCT 46.5 (*)    All other components within normal limits  URINALYSIS, MICROSCOPIC (REFLEX) - Abnormal; Notable for the following:    Bacteria, UA MANY (*)    Squamous Epithelial / LPF 0-5 (*)    All other components within normal limits  I-STAT CG4 LACTIC ACID, ED - Abnormal; Notable for the following:    Lactic Acid, Venous 2.23 (*)    All other components within normal limits  I-STAT BETA HCG BLOOD, ED ([facility], [facility], [facility] ONLY) - Abnormal; Notable for the following:    I-stat hCG, quantitative 8.3 (*)    All other components within normal limits  URINE CULTURE  CULTURE, BLOOD (ROUTINE X 2)  CULTURE, BLOOD (ROUTINE X 2)  LIPASE, BLOOD  PREGNANCY, URINE  EKG  EKG Interpretation  Date/Time:  Friday February 15 2017 14:14:10 EDT Ventricular Rate:  116 PR Interval:  132 QRS Duration: 76 QT Interval:  352 QTC Calculation: 489 R Axis:   115 Text Interpretation:  Sinus tachycardia Left posterior fascicular block Inferior infarct , age undetermined Abnormal ECG No significant change since last tracing Reconfirmed by Richardean Canal ([id number]) on 02/15/2017 11:10:45 PM       Radiology Ct Renal Stone Study  Result Date: 02/15/2017 CLINICAL DATA:  Left flank pain. EXAM: CT ABDOMEN AND PELVIS WITHOUT CONTRAST TECHNIQUE: Multidetector CT imaging of the abdomen and pelvis was performed following the standard protocol without IV contrast. COMPARISON:  Most recent CT 09/30/2016 FINDINGS: Lower chest: The lung bases are clear. Hepatobiliary: Liver is prominent size spanning 20 cm cranial caudal with diffusely decreased density. No evidence of focal lesion. Clips in the gallbladder fossa postcholecystectomy. No biliary dilatation. Pancreas: No ductal dilatation or inflammation. Spleen: Normal in size without focal abnormality. Adrenals/Urinary Tract: No adrenal nodule. No hydronephrosis or perinephric edema. Scattered calcifications within both kidneys likely combination of nonobstructing stones in  parenchymal calcifications. Lobular renal contours with renal scarring. No perinephric edema. Previous air in the left renal collecting system has resolved. Both ureters are decompressed without stones along the course. Urinary bladder is minimally distended. No bladder stone. Stomach/Bowel: Stomach is within normal limits. Appendix appears normal. No evidence of bowel wall thickening, distention, or inflammatory changes. Mild distal colonic diverticulosis without acute inflammation. Vascular/Lymphatic: Dense aortic atherosclerosis without aneurysm. Iliac vessels are small in caliber. Prominent porta hepatis nodes are likely reactive. Reproductive: Stable 16 mm right adnexal cyst dating back to 2014, benign. Uterus is unremarkable. Left ovary not confidently visualized, no adnexal mass. Other: No free air, free fluid, or intra-abdominal fluid collection. Musculoskeletal: There are no acute or suspicious osseous abnormalities. There is degenerative change in the spine. IMPRESSION: 1. No hydronephrosis or obstructive uropathy. 2. Bilateral renal calcifications likely combination of nonobstructing stones in parenchymal calcifications. Bilateral renal scarring. 3. Hepatic steatosis.  Minimal colonic diverticulosis. 4.  Aortic Atherosclerosis (ICD10-I70.0). Electronically Signed   By: Rubye Oaks M.D.   On: 02/15/2017 22:43    Procedures Procedures (including critical care time)  Medications Ordered in ED Medications  sodium chloride 0.9 % bolus 1,000 mL (0 mLs Intravenous Stopped 02/15/17 2307)  morphine 4 MG/ML injection 4 mg (4 mg Intravenous Given 02/15/17 2140)  ondansetron (ZOFRAN) injection 4 mg (4 mg Intravenous Given 02/15/17 2139)  piperacillin-tazobactam (ZOSYN) IVPB 3.375 g (0 g Intravenous Stopped 02/15/17 2259)     Initial Impression / Assessment and Plan / ED Course  I have reviewed the triage vital signs and the nursing notes.  Pertinent labs & imaging results that were available during  my care of the patient were reviewed by me and considered in my medical decision making (see chart for details).    DIONISIA PACHOLSKI is a 56 y.o. female here with flank pain, nausea, chills. Hx of emphysematous pyelonephritis so will get CT renal stone, labs, lactate, UA, lactate, cultures.   11:10 PM WBC 12. Lactate minimally elevated at 2.2. UA + leuk and too many to count WBC. CT renal stone showed no emphysematous pyelonephritis, just intra renal stones with no hydro. Pain controlled. Afebrile. Given 1 L NS bolus and zosyn. Reviewed previous dc summary. She had previous urine culture that is resistant to ampicillin and cipro but she was given zosyn and dc home with bactrim. Given clinical pyelo, I don't  think bactrim is sufficient. Will dc home with keflex, bactrim for better coverage. Blood and urine cultures sent.    Final Clinical Impressions(s) / ED Diagnoses   Final diagnoses:  None    New Prescriptions New Prescriptions   No medications on file     Charlynne Pander, MD 02/15/17 2311

## 2017-02-15 NOTE — ED Triage Notes (Signed)
Pt presents to the ed with complaints of left sided flank pain and feeling lightheaded. The patient denies any chest pain shortness of breath or urinary frequency, states that her stomach feels like it is in knots.

## 2017-02-15 NOTE — ED Notes (Signed)
Pt verbalized understanding of d/c instructions and has no further questions. Pt is stable, A&Ox4, VSS.  

## 2017-02-17 ENCOUNTER — Encounter (HOSPITAL_COMMUNITY): Payer: Self-pay

## 2017-02-17 ENCOUNTER — Emergency Department (HOSPITAL_COMMUNITY)
Admission: EM | Admit: 2017-02-17 | Discharge: 2017-02-17 | Disposition: A | Discharge status: Home / Self Care | Payer: Self-pay | Attending: Emergency Medicine | Admitting: Emergency Medicine

## 2017-02-17 ENCOUNTER — Emergency Department (HOSPITAL_COMMUNITY): Payer: Self-pay

## 2017-02-17 DIAGNOSIS — Z7982 Long term (current) use of aspirin: Secondary | ICD-10-CM | POA: Insufficient documentation

## 2017-02-17 DIAGNOSIS — E119 Type 2 diabetes mellitus without complications: Secondary | ICD-10-CM | POA: Insufficient documentation

## 2017-02-17 DIAGNOSIS — M5432 Sciatica, left side: Secondary | ICD-10-CM | POA: Insufficient documentation

## 2017-02-17 DIAGNOSIS — F17210 Nicotine dependence, cigarettes, uncomplicated: Secondary | ICD-10-CM | POA: Insufficient documentation

## 2017-02-17 DIAGNOSIS — Z794 Long term (current) use of insulin: Secondary | ICD-10-CM | POA: Insufficient documentation

## 2017-02-17 DIAGNOSIS — E785 Hyperlipidemia, unspecified: Secondary | ICD-10-CM | POA: Insufficient documentation

## 2017-02-17 DIAGNOSIS — I10 Essential (primary) hypertension: Secondary | ICD-10-CM | POA: Insufficient documentation

## 2017-02-17 LAB — URINE CULTURE

## 2017-02-17 MED ORDER — KETOROLAC TROMETHAMINE 60 MG/2ML IM SOLN
15.0000 mg | Freq: Once | INTRAMUSCULAR | Status: AC
  Administered 2017-02-17: 15 mg via INTRAMUSCULAR
  Filled 2017-02-17: qty 2

## 2017-02-17 MED ORDER — OXYCODONE HCL 5 MG PO TABS
5.0000 mg | ORAL_TABLET | Freq: Once | ORAL | Status: AC
  Administered 2017-02-17: 5 mg via ORAL
  Filled 2017-02-17: qty 1

## 2017-02-17 MED ORDER — ACETAMINOPHEN 500 MG PO TABS
1000.0000 mg | ORAL_TABLET | Freq: Once | ORAL | Status: AC
  Administered 2017-02-17: 1000 mg via ORAL
  Filled 2017-02-17: qty 2

## 2017-02-17 MED ORDER — DIAZEPAM 5 MG PO TABS
5.0000 mg | ORAL_TABLET | Freq: Once | ORAL | Status: AC
  Administered 2017-02-17: 5 mg via ORAL
  Filled 2017-02-17: qty 1

## 2017-02-17 NOTE — Discharge Instructions (Signed)
Take 4 over the counter ibuprofen tablets 3 times a day or 2 over-the-counter naproxen tablets twice a day for pain. Also take tylenol 1000mg(2 extra strength) four times a day.    

## 2017-02-17 NOTE — ED Notes (Signed)
Pt understood dc material. NAD noted. 

## 2017-02-17 NOTE — ED Provider Notes (Signed)
[facility] Provider Note   CSN: [id number] Arrival date & time: 02/17/17  0031     History   Chief Complaint Chief Complaint  Patient presents with  . Hip Pain    HPI [name] is a 56 y.o. female.  56 yo F low back pain that radiates to her leg. Going on for the past day. Denies injury. The pain wraps from her left SI joint area to the anterior aspect of her left leg. Denies weakness or numbness to the leg. Denies change in bowel or bladder. Denies loss of perirectal sensation. Denies fevers or chills. Denies recent injection or surgery..   The history is provided by the patient.  Hip Pain  This is a new problem. The current episode started 6 to 12 hours ago. The problem occurs constantly. The problem has not changed since onset.Pertinent negatives include no chest pain, no headaches and no shortness of breath. The symptoms are aggravated by walking and twisting. Relieved by: sitting indian style. She has tried nothing for the symptoms. The treatment provided no relief.    Past Medical History:  Diagnosis Date  . BACK PAIN, LOW 08/29/2006  . DEPRESSION, MAJOR, RECURRENT 08/29/2006  . DISORDER, BIPOLAR NOS 09/09/2006  . DMII (diabetes mellitus, type 2) (HCC) 09/19/2010  . Hyperlipidemia 09/19/2010   LDL 78 on 08/2010. Recheck 08/2011.    Marland Kitchen HYPERTENSION, BENIGN SYSTEMIC 08/29/2006   Well controlled on Lopressor and lisinopril. Check BMET 12/2011    . Kidney infection   . OVARIAN MASS 09/18/2007   Benign ovarian cyst s/p laparotomy for removal   . TOBACCO DEPENDENCE 08/29/2006   Patient quit in early months of 2012. Continue to follow for possible relapse.    . Urosepsis 09/27/2012    Patient Active Problem List   Diagnosis Date Noted  . Diabetes mellitus due to underlying condition, uncontrolled, with hyperglycemia, with long-term current use of insulin (HCC) 10/03/2016  . Health care maintenance 03/31/2015  . Yeast infection 12/08/2014  . Recurrent kidney  stones 10/03/2012  . 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 Obesity, Class III, BMI 40-49.9 (morbid obesity) (HCC) 11/08/2011  . Right knee pain 03/19/2011  . DMII (diabetes mellitus, type 2) (HCC) 09/19/2010  . Hyperlipidemia 09/19/2010  . DISORDER, BIPOLAR NOS 09/09/2006  . DEPRESSION, MAJOR, RECURRENT 08/29/2006  . TOBACCO DEPENDENCE 08/29/2006  . HYPERTENSION, BENIGN SYSTEMIC 08/29/2006  . VENOUS INSUFFICIENCY, CHRONIC 08/29/2006    Past Surgical History:  Procedure Laterality Date  . PARTIAL HYSTERECTOMY     unsure if had cervix removed.   . stents Bilateral    Kidneys    OB History    No data available       Home Medications    Prior to Admission medications   Medication Sig Start Date End Date Taking? Authorizing Provider  aspirin 81 MG EC tablet Take 81 mg by mouth daily.     [provider]  cephALEXin (KEFLEX) 500 MG capsule Take 1 capsule (500 mg total) by mouth 3 (three) times daily. 02/15/17   Charlynne Pander, MD  HYDROcodone-acetaminophen (NORCO/VICODIN) 5-325 MG tablet Take 2 tablets by mouth every 4 (four) hours as needed for moderate pain. 02/15/17   Charlynne Pander, MD  insulin NPH Human (HUMULIN N,NOVOLIN N) 100 UNIT/ML injection Inject 0.1-0.12 mLs (10-12 Units total) into the skin 2 (two) times daily. 12 units q am, 10 units q hs Patient taking differently: Inject 10-12 Units into  the skin See admin instructions. 10 units q am, 12 units q hs 04/27/14   Reva Bores, MD  insulin regular (HUMULIN R) 100 units/mL injection Inject 0.05 mLs (5 Units total) into the skin 2 (two) times daily before a meal. Patient taking differently: Inject 8-10 Units into the skin 2 (two) times daily before a meal.  04/27/14   Reva Bores, MD  Multiple Vitamin (MULTIVITAMIN WITH MINERALS) TABS Take 1 tablet by mouth daily.    [provider]  sulfamethoxazole-trimethoprim (BACTRIM DS,SEPTRA DS)  800-160 MG tablet Take 1 tablet by mouth 2 (two) times daily. 02/15/17 02/22/17  Charlynne Pander, MD    Family History No family history on file.  Social History Social History  Substance Use Topics  . Smoking status: Current Every Day Smoker    Packs/day: 1.00    Types: Cigarettes  . Smokeless tobacco: Never Used  . Alcohol use Yes     Allergies   Tamsulosin   Review of Systems Review of Systems  Constitutional: Negative for chills and fever.  HENT: Negative for congestion and rhinorrhea.   Eyes: Negative for redness and visual disturbance.  Respiratory: Negative for shortness of breath and wheezing.   Cardiovascular: Negative for chest pain and palpitations.  Gastrointestinal: Negative for nausea and vomiting.  Genitourinary: Negative for dysuria and urgency.  Musculoskeletal: Positive for arthralgias and myalgias.  Skin: Negative for pallor and wound.  Neurological: Negative for dizziness and headaches.     Physical Exam Updated Vital Signs BP (!) 144/74   Pulse 95   Temp 98.1 F (36.7 C) (Oral)   Resp 18   Ht 5\' 3"  (1.6 m)   Wt 122.5 kg (270 lb)   SpO2 95%   BMI 47.83 kg/m   Physical Exam  Constitutional: She is oriented to person, place, and time. She appears well-developed and well-nourished. No distress.  HENT:  Head: Normocephalic and atraumatic.  Eyes: Pupils are equal, round, and reactive to light. EOM are normal.  Neck: Normal range of motion. Neck supple.  Cardiovascular: Normal rate and regular rhythm.  Exam reveals no gallop and no friction rub.   No murmur heard. Pulmonary/Chest: Effort normal. She has no wheezes. She has no rales.  Abdominal: Soft. She exhibits no distension and no mass. There is no tenderness. There is no guarding.  Musculoskeletal: She exhibits tenderness. She exhibits no edema.  Motor and sensation intact to the LLE.  L foot cool to touch compared to right, no noted pulse palpated to either food. Dopplerable triphasic PT  bilat without DP.   Neurological: She is alert and oriented to person, place, and time.  Skin: Skin is warm and dry. She is not diaphoretic.  Psychiatric: She has a normal mood and affect. Her behavior is normal.  Nursing note and vitals reviewed.    ED Treatments / Results  Labs (all labs ordered are listed, but only abnormal results are displayed) Labs Reviewed - No data to display  EKG  EKG Interpretation None       Radiology Ct Renal Stone Study  Result Date: 02/15/2017 CLINICAL DATA:  Left flank pain. EXAM: CT ABDOMEN AND PELVIS WITHOUT CONTRAST TECHNIQUE: Multidetector CT imaging of the abdomen and pelvis was performed following the standard protocol without IV contrast. COMPARISON:  Most recent CT 09/30/2016 FINDINGS: Lower chest: The lung bases are clear. Hepatobiliary: Liver is prominent size spanning 20 cm cranial caudal with diffusely decreased density. No evidence of focal lesion. Clips in the  gallbladder fossa postcholecystectomy. No biliary dilatation. Pancreas: No ductal dilatation or inflammation. Spleen: Normal in size without focal abnormality. Adrenals/Urinary Tract: No adrenal nodule. No hydronephrosis or perinephric edema. Scattered calcifications within both kidneys likely combination of nonobstructing stones in parenchymal calcifications. Lobular renal contours with renal scarring. No perinephric edema. Previous air in the left renal collecting system has resolved. Both ureters are decompressed without stones along the course. Urinary bladder is minimally distended. No bladder stone. Stomach/Bowel: Stomach is within normal limits. Appendix appears normal. No evidence of bowel wall thickening, distention, or inflammatory changes. Mild distal colonic diverticulosis without acute inflammation. Vascular/Lymphatic: Dense aortic atherosclerosis without aneurysm. Iliac vessels are small in caliber. Prominent porta hepatis nodes are likely reactive. Reproductive: Stable 16 mm  right adnexal cyst dating back to 2014, benign. Uterus is unremarkable. Left ovary not confidently visualized, no adnexal mass. Other: No free air, free fluid, or intra-abdominal fluid collection. Musculoskeletal: There are no acute or suspicious osseous abnormalities. There is degenerative change in the spine. IMPRESSION: 1. No hydronephrosis or obstructive uropathy. 2. Bilateral renal calcifications likely combination of nonobstructing stones in parenchymal calcifications. Bilateral renal scarring. 3. Hepatic steatosis.  Minimal colonic diverticulosis. 4.  Aortic Atherosclerosis (ICD10-I70.0). Electronically Signed   By: Rubye Oaks M.D.   On: 02/15/2017 22:43   Dg Hip Unilat With Pelvis 2-3 Views Left  Result Date: 02/17/2017 CLINICAL DATA:  LEFT hip pain for 1 day.  No injury. EXAM: DG HIP (WITH OR WITHOUT PELVIS) 2-3V LEFT COMPARISON:  CT abdomen and pelvis February 15, 2017 FINDINGS: Large body habitus. There is no evidence of hip fracture or dislocation. There is no evidence of arthropathy or other focal bone abnormality. Surgical clip LEFT pelvis. Mild vascular calcifications. IMPRESSION: Negative. Electronically Signed   By: Awilda Metro M.D.   On: 02/17/2017 01:45    Procedures Procedures (including critical care time)  Medications Ordered in ED Medications  ketorolac (TORADOL) injection 15 mg (15 mg Intramuscular Given 02/17/17 0524)  acetaminophen (TYLENOL) tablet 1,000 mg (1,000 mg Oral Given 02/17/17 0525)  oxyCODONE (Oxy IR/ROXICODONE) immediate release tablet 5 mg (5 mg Oral Given 02/17/17 0525)  diazepam (VALIUM) tablet 5 mg (5 mg Oral Given 02/17/17 0525)     Initial Impression / Assessment and Plan / ED Course  I have reviewed the triage vital signs and the nursing notes.  Pertinent labs & imaging results that were available during my care of the patient were reviewed by me and considered in my medical decision making (see chart for details).     56 yo F With a chief  complaint of left-sided low back pain. Sounds like a radicular pain. No red flags. However on exam the patient had no noted pulses to the left lower extremity and had a cool to touch foot. There is also nonpalpable pulse on the other foot. Equal dopplerable pulses to the posterior tibialis. I doubt that this is an acute arterial occlusion based on history and physical doesn't seem consistent with it as well. Treat symptomatically. PCP follow-up.  Discussed smoking cessation with patient and was they were offerred resources to help stop.  Total time was 5 min CPT code 16109.   6:04 AM:  I have discussed the diagnosis/risks/treatment options with the patient and family and believe the pt to be eligible for discharge home to follow-up with PCP. We also discussed returning to the ED immediately if new or worsening sx occur. We discussed the sx which are most concerning (e.g., sudden worsening pain,  fever, cauda equina s/sx) that necessitate immediate return. Medications administered to the patient during their visit and any new prescriptions provided to the patient are listed below.  Medications given during this visit Medications  ketorolac (TORADOL) injection 15 mg (15 mg Intramuscular Given 02/17/17 0524)  acetaminophen (TYLENOL) tablet 1,000 mg (1,000 mg Oral Given 02/17/17 0525)  oxyCODONE (Oxy IR/ROXICODONE) immediate release tablet 5 mg (5 mg Oral Given 02/17/17 0525)  diazepam (VALIUM) tablet 5 mg (5 mg Oral Given 02/17/17 0525)     The patient appears reasonably screen and/or stabilized for discharge and I doubt any other medical condition or other [facility] requiring further screening, evaluation, or treatment in the ED at this time prior to discharge.    Final Clinical Impressions(s) / ED Diagnoses   Final diagnoses:  Sciatica of left side    New Prescriptions Discharge Medication List as of 02/17/2017  5:18 AM       Melene Plan, DO 02/17/17 0223

## 2017-02-17 NOTE — ED Triage Notes (Signed)
Pt to ED from home c/o new L hip pain that radiates down to her L knee onset yesterday. Pt was recently seen 2 days ago for kidney infection and placed on abx. Pt reports she cannot bear weight on L leg d/t severe pain, worse with movement. Full ROM despite pain. Pt denies weakness in extremity. Denies injury.

## 2017-02-20 ENCOUNTER — Ambulatory Visit (INDEPENDENT_AMBULATORY_CARE_PROVIDER_SITE_OTHER): Payer: Self-pay | Admitting: Internal Medicine

## 2017-02-20 ENCOUNTER — Encounter: Payer: Self-pay | Admitting: Internal Medicine

## 2017-02-20 VITALS — BP 158/84 | HR 85 | Temp 97.9°F | Ht 63.0 in

## 2017-02-20 DIAGNOSIS — Z794 Long term (current) use of insulin: Secondary | ICD-10-CM

## 2017-02-20 DIAGNOSIS — M5442 Lumbago with sciatica, left side: Secondary | ICD-10-CM

## 2017-02-20 DIAGNOSIS — E118 Type 2 diabetes mellitus with unspecified complications: Secondary | ICD-10-CM

## 2017-02-20 LAB — POCT GLYCOSYLATED HEMOGLOBIN (HGB A1C): Hemoglobin A1C: 8.3

## 2017-02-20 MED ORDER — HYDROCODONE-ACETAMINOPHEN 5-325 MG PO TABS: 2.0000 | ORAL_TABLET | ORAL | 0 refills | Status: DC | PRN

## 2017-02-20 MED ORDER — GABAPENTIN 300 MG PO CAPS
300.0000 mg | ORAL_CAPSULE | Freq: Three times a day (TID) | ORAL | 1 refills | Status: DC
Start: 2017-02-20 — End: 2017-02-20

## 2017-02-20 MED ORDER — GABAPENTIN 300 MG PO CAPS: 300.0000 mg | ORAL_CAPSULE | Freq: Two times a day (BID) | ORAL | 1 refills | Status: DC

## 2017-02-20 NOTE — Patient Instructions (Addendum)
Ms. Spotts,  I suspect you have a herniated disc or acute strain worsening sciatica. Symptoms can take about 6 weeks to improve. Typically treatment involves ibuprofen, tylenol, a nerve pain medication (gabapentin) and opioid pain medication.  I will refill the norco and have you try gabapentin. Do continue to try to stay somewhat active as able. If pain worsens, see [place] back, but otherwise follow-up in about 2 weeks.  Best, Dr. Sampson Goon

## 2017-02-20 NOTE — Progress Notes (Signed)
[facility] Progress Note  Subjective:  [name] is a 56 y.o. female with history of HLD, HTN, depression, T2DM, bipolar disorder, and low back pain who presents for severe back pain. She is accompanied by her daughter. She was seen for this 02/17/17 in the ED. She is having pain radiating into her groin, lower back, and thigh, so L hip x-rays were obtained in ED and were normal. Her L foot was felt to be much cooler than her R during that visit, but she had equal dopplerable pulses. Symptoms began night of ED visit. Denies any recent injuries.   Since ED visit, pain has not gotten better. She is taking ibuprofen and tylenol and occasional oxy IR. She is now using a walker to get around at home. Staying seated for a long time hurts. She has never taken gabapentin for her back pain before.   Of note, she is on bactrim for UTI found at another ED visit 02/15/17. CT renal stone study showed some non-obstructing stones without hydronephrosis.   Patient with history of some disc space narrowing at L4-5 seenon lumbar xray in 2007.   ROS: No urinary or bladder incontinence, no fevers   Allergies  Allergen Reactions  . Tamsulosin Shortness Of Breath    Objective: Blood pressure (!) 158/84, pulse 85, temperature 97.9 F (36.6 C), temperature source Oral, height 5\' 3"  (1.6 m), SpO2 93 %.  Constitutional: Obese female, sitting in clinic wheelchair Cardiovascular: RRR, S1, S2, no m/r/g.  Pulmonary/Chest: Effort normal and breath sounds normal. No respiratory distress. .  Musculoskeletal: TTP over lower lumbar spine and paraspinal muscles. No radiation of pain with straight leg raise but reports pain in knees.  Neurological: AOx3, patellar reflex 1+ on L compared to 2+ on R, though difficult for patient to relax when obtaining reflexes on L. Skin: Skin is warm and dry. Both feet warm to the touch, though R a little more so.  Psychiatric: Tearful.  Vitals  reviewed  Assessment/Plan: Acute left-sided low back pain with left-sided sciatica - Differential includes flare of herniated disc, pinched nerve, muscle spasms and compression fracture. Views of lumbar spine available by renal stone study performed last week and do not show signs of compression fracture. Given previous history of narrowing at L4-5 and pain consistent with sciatica, think herniated disc most likely.  - Recommended starting gabapentin at 300 mg BID, as patient has reduced kidney function. - Continue ibuprofen and tylenol. - Counseled patient that it could take several weeks to feel much better. If pain does not improve, would consider MRI. - Provided rx for norco 5-325 mg, #30  Follow-up in about 1 month.  Dani Gobble, MD [facility], PGY-3

## 2017-02-21 DIAGNOSIS — M5442 Lumbago with sciatica, left side: Secondary | ICD-10-CM | POA: Insufficient documentation

## 2017-02-21 LAB — CULTURE, BLOOD (ROUTINE X 2)
CULTURE: NO GROWTH
Special Requests: ADEQUATE

## 2017-02-21 NOTE — Assessment & Plan Note (Signed)
-   Differential includes flare of herniated disc, pinched nerve, muscle spasms and compression fracture. Views of lumbar spine available by renal stone study performed last week and do not show signs of compression fracture. Given previous history of narrowing at L4-5 and pain consistent with sciatica, think herniated disc most likely.  - Recommended starting gabapentin at 300 mg BID, as patient has reduced kidney function. - Continue ibuprofen and tylenol. - Counseled patient that it could take several weeks to feel much better. If pain does not improve, would consider MRI. - Provided rx for norco 5-325 mg, #30

## 2017-02-26 ENCOUNTER — Emergency Department (HOSPITAL_COMMUNITY)
Admission: EM | Admit: 2017-02-26 | Discharge: 2017-02-26 | Disposition: A | Discharge status: Home / Self Care | Payer: Self-pay | Attending: Emergency Medicine | Admitting: Emergency Medicine

## 2017-02-26 ENCOUNTER — Encounter (HOSPITAL_COMMUNITY): Payer: Self-pay | Admitting: Emergency Medicine

## 2017-02-26 ENCOUNTER — Emergency Department (HOSPITAL_COMMUNITY): Payer: Self-pay

## 2017-02-26 DIAGNOSIS — M25572 Pain in left ankle and joints of left foot: Secondary | ICD-10-CM | POA: Insufficient documentation

## 2017-02-26 DIAGNOSIS — Y92009 Unspecified place in unspecified non-institutional (private) residence as the place of occurrence of the external cause: Secondary | ICD-10-CM

## 2017-02-26 DIAGNOSIS — I129 Hypertensive chronic kidney disease with stage 1 through stage 4 chronic kidney disease, or unspecified chronic kidney disease: Secondary | ICD-10-CM | POA: Insufficient documentation

## 2017-02-26 DIAGNOSIS — E1122 Type 2 diabetes mellitus with diabetic chronic kidney disease: Secondary | ICD-10-CM | POA: Insufficient documentation

## 2017-02-26 DIAGNOSIS — Z7982 Long term (current) use of aspirin: Secondary | ICD-10-CM | POA: Insufficient documentation

## 2017-02-26 DIAGNOSIS — M79662 Pain in left lower leg: Secondary | ICD-10-CM

## 2017-02-26 DIAGNOSIS — N182 Chronic kidney disease, stage 2 (mild): Secondary | ICD-10-CM | POA: Insufficient documentation

## 2017-02-26 DIAGNOSIS — Y929 Unspecified place or not applicable: Secondary | ICD-10-CM | POA: Insufficient documentation

## 2017-02-26 DIAGNOSIS — Y9301 Activity, walking, marching and hiking: Secondary | ICD-10-CM | POA: Insufficient documentation

## 2017-02-26 DIAGNOSIS — M79672 Pain in left foot: Secondary | ICD-10-CM

## 2017-02-26 DIAGNOSIS — Z79899 Other long term (current) drug therapy: Secondary | ICD-10-CM | POA: Insufficient documentation

## 2017-02-26 DIAGNOSIS — Y998 Other external cause status: Secondary | ICD-10-CM | POA: Insufficient documentation

## 2017-02-26 DIAGNOSIS — F17210 Nicotine dependence, cigarettes, uncomplicated: Secondary | ICD-10-CM | POA: Insufficient documentation

## 2017-02-26 DIAGNOSIS — F319 Bipolar disorder, unspecified: Secondary | ICD-10-CM | POA: Insufficient documentation

## 2017-02-26 DIAGNOSIS — Z794 Long term (current) use of insulin: Secondary | ICD-10-CM | POA: Insufficient documentation

## 2017-02-26 DIAGNOSIS — W19XXXA Unspecified fall, initial encounter: Secondary | ICD-10-CM

## 2017-02-26 DIAGNOSIS — W010XXA Fall on same level from slipping, tripping and stumbling without subsequent striking against object, initial encounter: Secondary | ICD-10-CM | POA: Insufficient documentation

## 2017-02-26 NOTE — ED Triage Notes (Signed)
Pt here for left ankle pain after trip and fall on Friday; some swelling noted

## 2017-02-26 NOTE — ED Notes (Signed)
Patient transported to X-ray 

## 2017-02-26 NOTE — ED Provider Notes (Signed)
[facility] Provider Note   CSN: [id number] Arrival date & time: 02/26/17  1833     History   Chief Complaint Chief Complaint  Patient presents with  . Ankle Pain    HPI ADDYSYN FERN is a 56 y.o. female.  Patient states her left leg "gave away" while walking on Friday, resulting in a fall in which her left leg ending up underneath her body weight while knee was flexed. She is reporting pain to her lower left leg, outer left ankle, and across the top of the left foot.    Fall  This is a new problem. The current episode started more than 2 days ago.    Past Medical History:  Diagnosis Date  . BACK PAIN, LOW 08/29/2006  . DEPRESSION, MAJOR, RECURRENT 08/29/2006  . DISORDER, BIPOLAR NOS 09/09/2006  . DMII (diabetes mellitus, type 2) (HCC) 09/19/2010  . Hyperlipidemia 09/19/2010   LDL 78 on 08/2010. Recheck 08/2011.    Marland Kitchen HYPERTENSION, BENIGN SYSTEMIC 08/29/2006   Well controlled on Lopressor and lisinopril. Check BMET 12/2011    . Kidney infection   . OVARIAN MASS 09/18/2007   Benign ovarian cyst s/p laparotomy for removal   . TOBACCO DEPENDENCE 08/29/2006   Patient quit in early months of 2012. Continue to follow for possible relapse.    . Urosepsis 09/27/2012    Patient Active Problem List   Diagnosis Date Noted  . Acute left-sided low back pain with left-sided sciatica 02/21/2017  . Diabetes mellitus due to underlying condition, uncontrolled, with hyperglycemia, with long-term current use of insulin (HCC) 10/03/2016  . Health care maintenance 03/31/2015  . Yeast infection 12/08/2014  . Recurrent kidney stones 10/03/2012  . 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 Obesity, Class III, BMI 40-49.9 (morbid obesity) (HCC) 11/08/2011  . Right knee pain 03/19/2011  . DMII (diabetes mellitus, type 2) (HCC) 09/19/2010  . Hyperlipidemia 09/19/2010  . DISORDER, BIPOLAR NOS 09/09/2006  . DEPRESSION, MAJOR, RECURRENT  08/29/2006  . TOBACCO DEPENDENCE 08/29/2006  . HYPERTENSION, BENIGN SYSTEMIC 08/29/2006  . VENOUS INSUFFICIENCY, CHRONIC 08/29/2006    Past Surgical History:  Procedure Laterality Date  . PARTIAL HYSTERECTOMY     unsure if had cervix removed.   . stents Bilateral    Kidneys    OB History    No data available       Home Medications    Prior to Admission medications   Medication Sig Start Date End Date Taking? Authorizing Provider  aspirin 81 MG EC tablet Take 81 mg by mouth daily.     [provider]  cephALEXin (KEFLEX) 500 MG capsule Take 1 capsule (500 mg total) by mouth 3 (three) times daily. 02/15/17   Charlynne Pander, MD  gabapentin (NEURONTIN) 300 MG capsule Take 1 capsule (300 mg total) by mouth 2 (two) times daily. 02/20/17   Casey Burkitt, MD  HYDROcodone-acetaminophen (NORCO/VICODIN) 5-325 MG tablet Take 2 tablets by mouth every 4 (four) hours as needed for moderate pain. 02/20/17   Casey Burkitt, MD  insulin NPH Human (HUMULIN N,NOVOLIN N) 100 UNIT/ML injection Inject 0.1-0.12 mLs (10-12 Units total) into the skin 2 (two) times daily. 12 units q am, 10 units q hs Patient taking differently: Inject 10-12 Units into the skin See admin instructions. 10 units q am, 12 units q hs 04/27/14   Reva Bores, MD  insulin regular (HUMULIN R) 100 units/mL injection Inject 0.05 mLs (5 Units  total) into the skin 2 (two) times daily before a meal. Patient taking differently: Inject 8-10 Units into the skin 2 (two) times daily before a meal.  04/27/14   Reva Bores, MD  Multiple Vitamin (MULTIVITAMIN WITH MINERALS) TABS Take 1 tablet by mouth daily.    [provider]    Family History History reviewed. No pertinent family history.  Social History Social History  Substance Use Topics  . Smoking status: Current Every Day Smoker    Packs/day: 1.00    Types: Cigarettes  . Smokeless tobacco: Never Used  . Alcohol use Yes      Allergies   Tamsulosin   Review of Systems Review of Systems  All other systems reviewed and are negative.    Physical Exam Updated Vital Signs BP (!) 153/69 (BP Location: Right Arm)   Pulse 88   Temp 97.8 F (36.6 C) (Oral)   Resp 18   Ht 5\' 3"  (1.6 m)   Wt 122.5 kg (270 lb)   SpO2 97%   BMI 47.83 kg/m   Physical Exam  Constitutional: She is oriented to person, place, and time. She appears well-developed and well-nourished.  HENT:  Head: Atraumatic.  Eyes: Conjunctivae are normal.  Neck: Neck supple.  Cardiovascular: Normal rate and regular rhythm.   Pulmonary/Chest: Effort normal and breath sounds normal.  Abdominal: Soft. Bowel sounds are normal.  Musculoskeletal: She exhibits tenderness. She exhibits no deformity.  Neurological: She is alert and oriented to person, place, and time.  Skin: Skin is warm and dry.  Psychiatric: She has a normal mood and affect.  Nursing note and vitals reviewed.    ED Treatments / Results  Labs (all labs ordered are listed, but only abnormal results are displayed) Labs Reviewed - No data to display  EKG  EKG Interpretation None       Radiology Dg Tibia/fibula Left  Result Date: 02/26/2017 CLINICAL DATA:  Status post fall with painful low left lower extremity. EXAM: LEFT TIBIA AND FIBULA - 2 VIEW COMPARISON:  None. FINDINGS: There is no evidence of fracture or dislocation. Soft tissues are unremarkable. IMPRESSION: No acute abnormality. Electronically Signed   By: Sherian Rein M.D.   On: 02/26/2017 20:27   Dg Ankle Complete Left  Result Date: 02/26/2017 CLINICAL DATA:  Status post fall with painful left lower extremity. EXAM: LEFT ANKLE COMPLETE - 3+ VIEW COMPARISON:  None. FINDINGS: There is no evidence of fracture, dislocation, or joint effusion. Plantar calcaneal spur is identified. Soft tissues are unremarkable. IMPRESSION: No acute abnormality. Electronically Signed   By: Sherian Rein M.D.   On: 02/26/2017  20:26   Dg Foot Complete Left  Result Date: 02/26/2017 CLINICAL DATA:  Status post fall with pain of the left lower extremity. EXAM: LEFT FOOT - COMPLETE 3+ VIEW COMPARISON:  None. FINDINGS: There is no evidence of fracture or dislocation. Plantar calcaneal spur is identified. Soft tissues are unremarkable. IMPRESSION: No acute abnormality. Electronically Signed   By: Sherian Rein M.D.   On: 02/26/2017 20:26    Procedures Procedures (including critical care time)  Medications Ordered in ED Medications - No data to display   Initial Impression / Assessment and Plan / ED Course  I have reviewed the triage vital signs and the nursing notes.  Pertinent labs & imaging results that were available during my care of the patient were reviewed by me and considered in my medical decision making (see chart for details).     Patient X-Ray  negative for obvious fracture or dislocation.  Pt advised to follow up with primary care provider. Patient given ASO ankle splint while in ED, conservative therapy recommended and discussed. Patient will be discharged home & is agreeable with above plan. Returns precautions discussed. Pt appears safe for discharge.  Final Clinical Impressions(s) / ED Diagnoses   Final diagnoses:  Fall in home, initial encounter  Acute left ankle pain  Left foot pain  Pain in left lower leg    New Prescriptions New Prescriptions   No medications on file     Felicie Morn, NP 02/26/17 2132    Pricilla Loveless, MD 02/27/17 [phone]

## 2017-03-08 ENCOUNTER — Telehealth: Payer: Self-pay | Admitting: *Deleted

## 2017-03-08 ENCOUNTER — Other Ambulatory Visit: Payer: Self-pay | Admitting: Internal Medicine

## 2017-03-08 MED ORDER — CYCLOBENZAPRINE HCL 5 MG PO TABS: 5.0000 mg | ORAL_TABLET | Freq: Three times a day (TID) | ORAL | 0 refills | Status: DC | PRN

## 2017-03-08 NOTE — Telephone Encounter (Signed)
Patient left message on voicemail stating that Dr. Sampson GoonFitzgerald told her to call if gabapentin was not working for her. Patient states MD told her that she could possibly try another medication. Will forward to MD.

## 2017-03-08 NOTE — Telephone Encounter (Signed)
Called patient to discuss. Placed order for flexeril.

## 2017-03-08 NOTE — Telephone Encounter (Signed)
Called patient regarding gabapentin no longer helping much. She said initially it helped some. Recommended increasing dose to 600 mg BID vs trying a muscle relaxant. Pain bothers her most at night when trying to sleep. She prefers to try muscle relaxant. Ordered flexeril 5 mg TID prn to [employer].   Dani GobbleHillary Taysom Glymph, MD [facility], PGY-3

## 2017-07-22 IMAGING — US US RENAL
1 series · 14 of 25 positions shown · non-contrast
Comparison: CT of the abdomen and pelvis from 05/22/2013, and renal
ultrasound performed 09/27/2012

CLINICAL DATA: Acute onset of left flank pain.  Initial encounter.

EXAM:
RENAL / URINARY TRACT ULTRASOUND COMPLETE

[Series 1: us renal · 0.25mm/px · 14 of 38 slices shown]
[im 1/38]
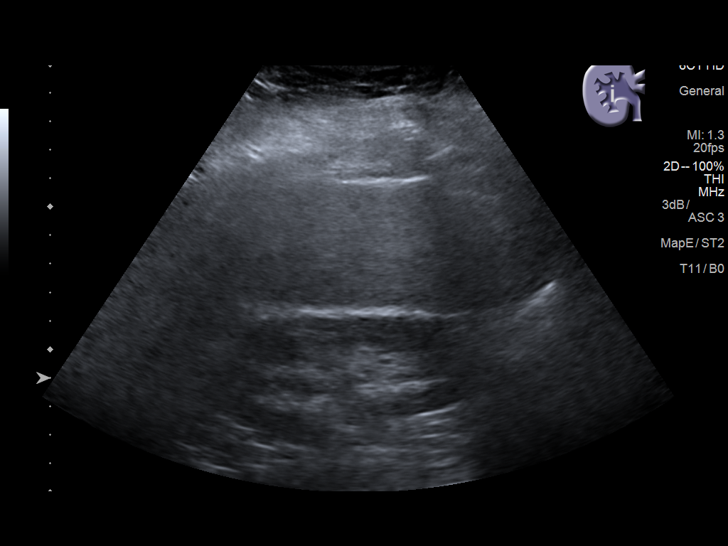
[im 4/38]
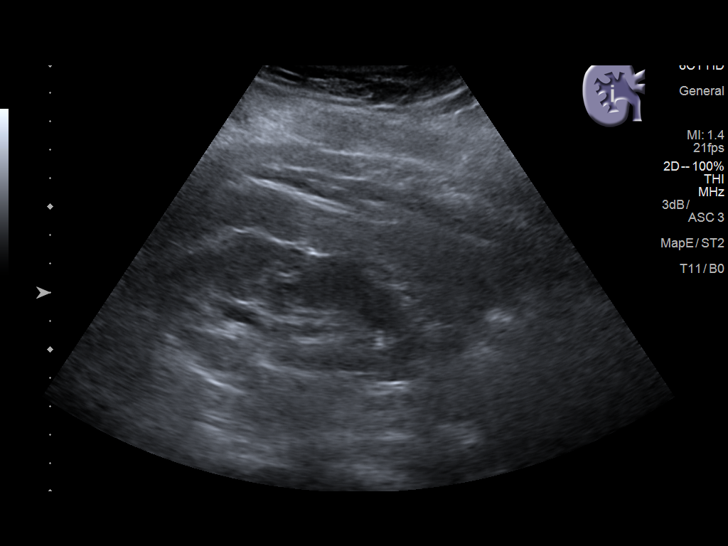
[im 7/38]
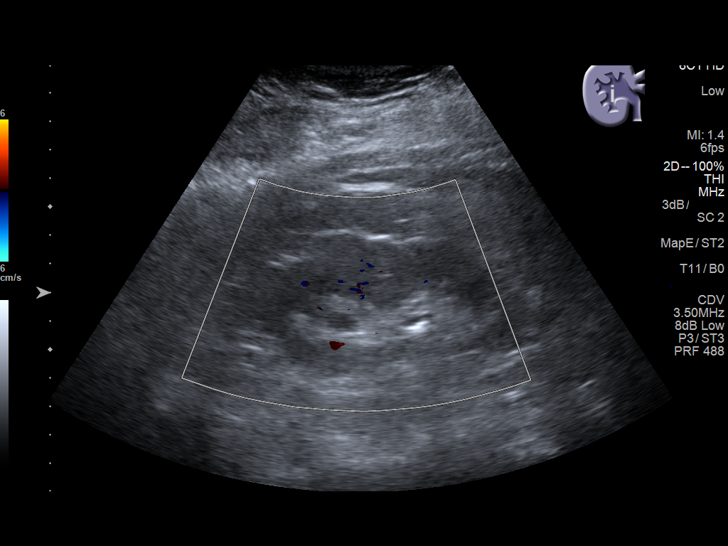
[im 10/38]
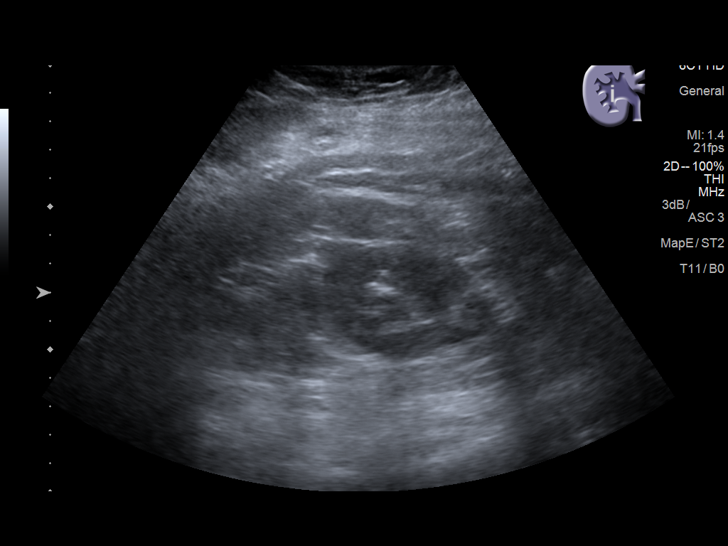
[im 13/38]
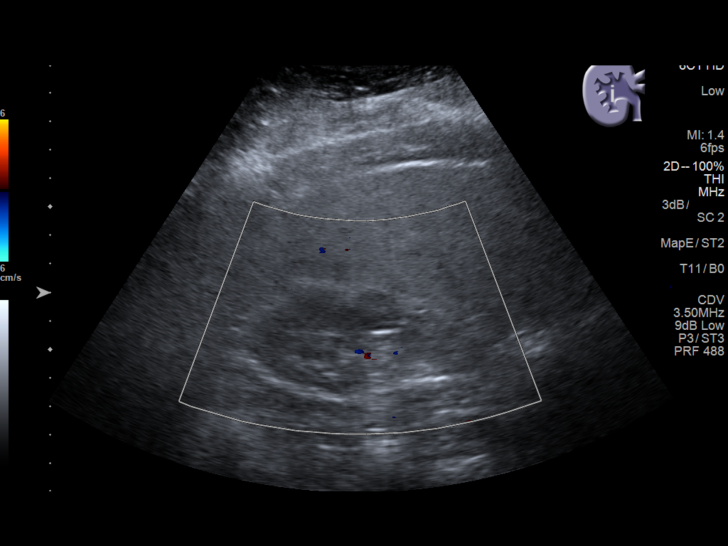
[im 14/38]
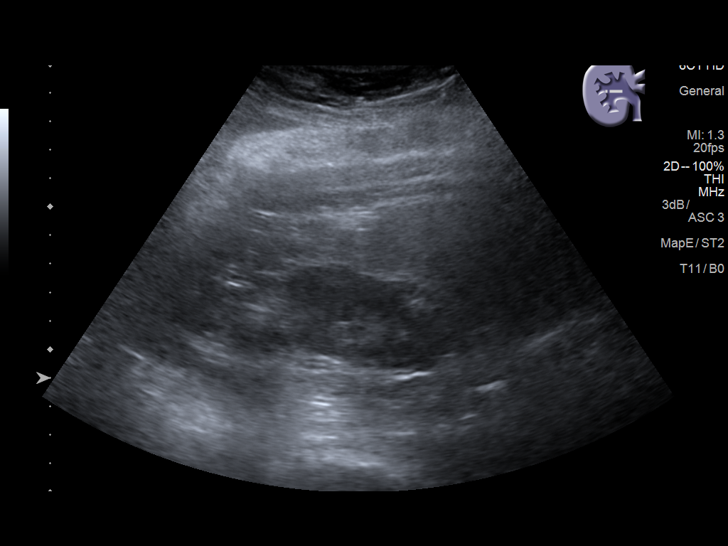
[im 17/38]
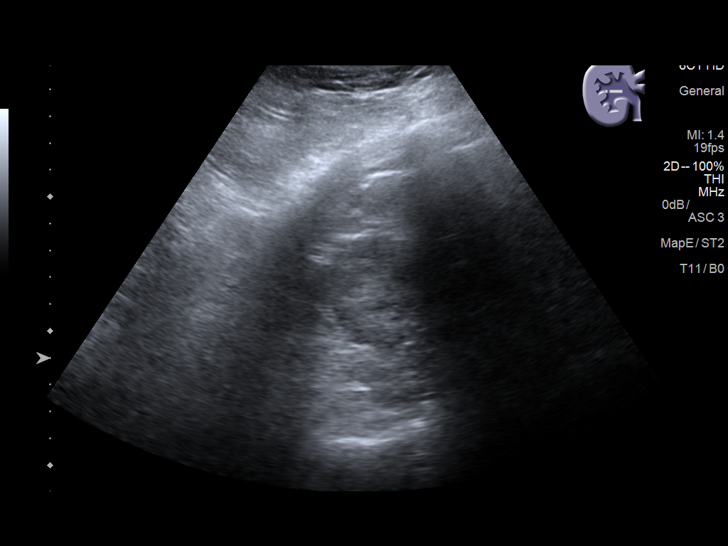
[im 21/38]
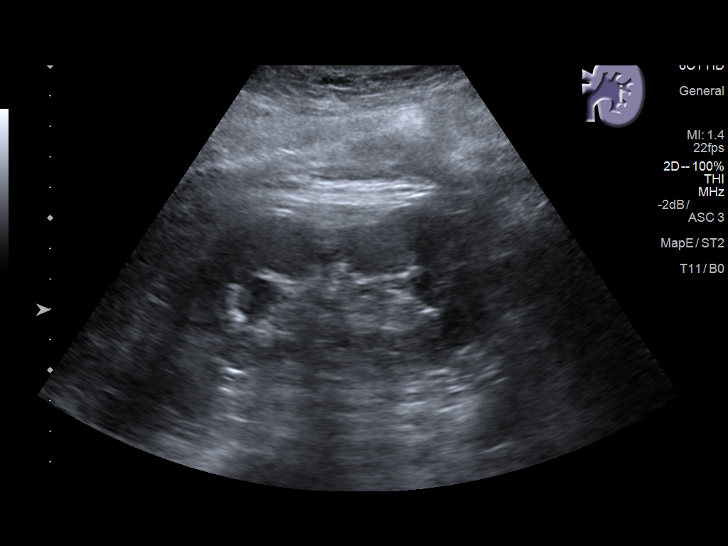
[im 24/38]
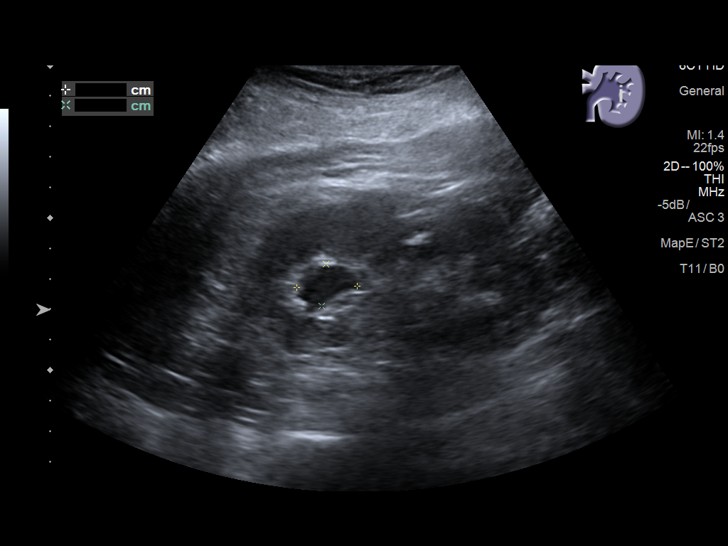
[im 25/38]
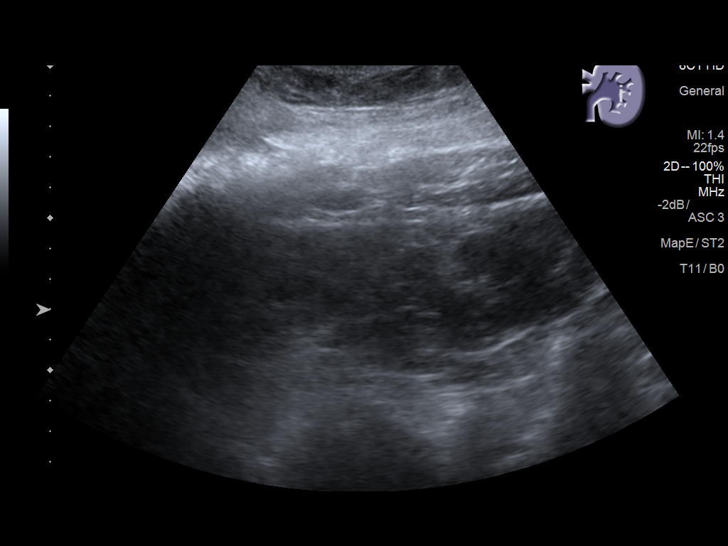
[im 28/38]
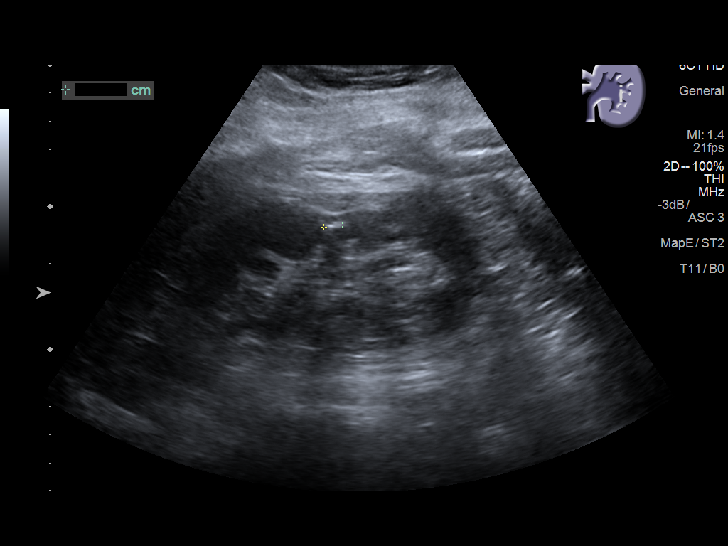
[im 31/38]
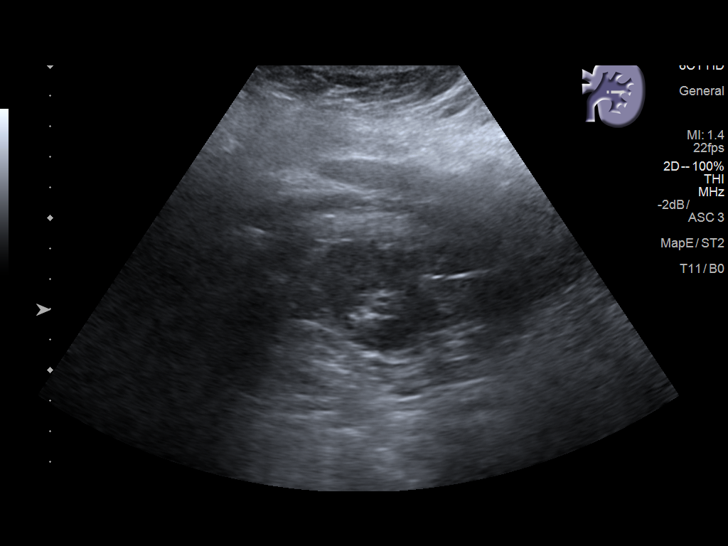
[im 34/38]
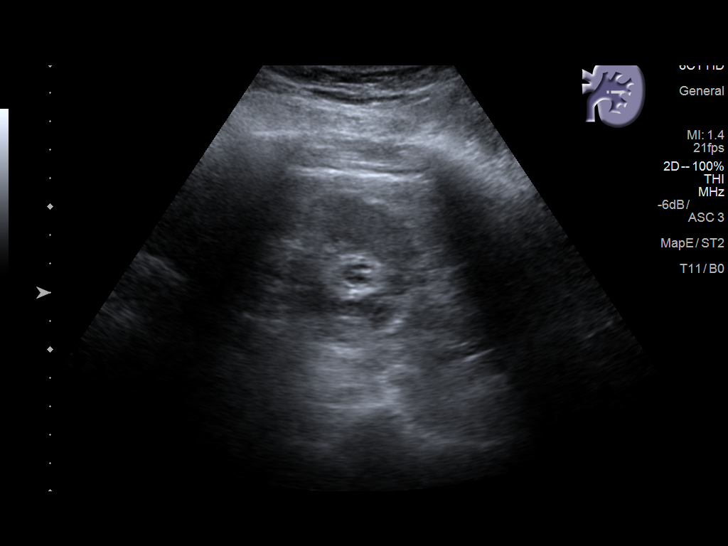
[im 38/38]
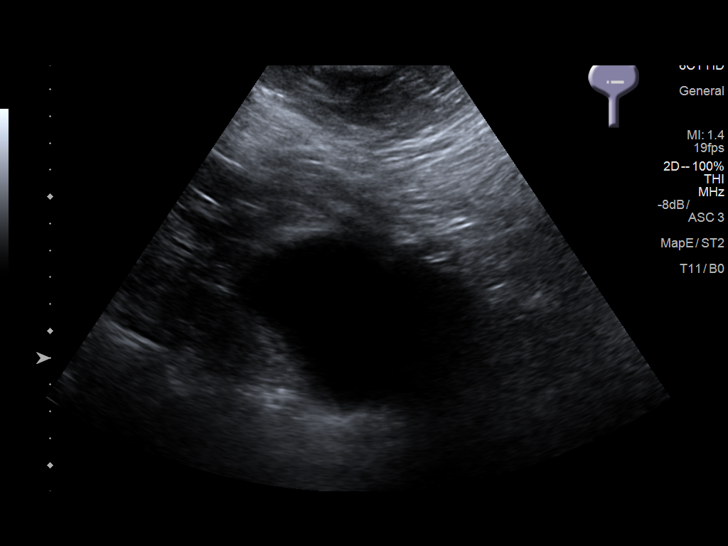

[14 of 25 positions shown; findings below may reference images not displayed]

FINDINGS: Right Kidney:

Length: 11.4 cm. Echogenicity within normal limits. Mild
pelvicaliectasis is noted. No mass or hydronephrosis visualized.
Stones within the right kidney measure to 0.8 cm in size.

Left Kidney:

Length: 10.4 cm. Echogenicity within normal limits. Minimal
left-sided hydronephrosis is noted. No mass is seen. Stones within
the left kidney measure up to 0.7 cm size.

Bladder:

Appears normal for degree of bladder distention.
IMPRESSION: 1. Minimal left-sided hydronephrosis may reflect recurrent distal
obstruction.
2. Nonobstructing bilateral renal stones seen, measuring up to
cm in size.

## 2017-08-11 ENCOUNTER — Emergency Department (HOSPITAL_COMMUNITY)
Admission: EM | Admit: 2017-08-11 | Discharge: 2017-08-11 | Disposition: A | Discharge status: Home / Self Care | Payer: Self-pay | Attending: Emergency Medicine | Admitting: Emergency Medicine

## 2017-08-11 ENCOUNTER — Emergency Department (HOSPITAL_COMMUNITY): Payer: Self-pay

## 2017-08-11 ENCOUNTER — Encounter (HOSPITAL_COMMUNITY): Payer: Self-pay | Admitting: Emergency Medicine

## 2017-08-11 DIAGNOSIS — I130 Hypertensive heart and chronic kidney disease with heart failure and stage 1 through stage 4 chronic kidney disease, or unspecified chronic kidney disease: Secondary | ICD-10-CM | POA: Insufficient documentation

## 2017-08-11 DIAGNOSIS — E785 Hyperlipidemia, unspecified: Secondary | ICD-10-CM | POA: Insufficient documentation

## 2017-08-11 DIAGNOSIS — S61451A Open bite of right hand, initial encounter: Secondary | ICD-10-CM

## 2017-08-11 DIAGNOSIS — N182 Chronic kidney disease, stage 2 (mild): Secondary | ICD-10-CM | POA: Insufficient documentation

## 2017-08-11 DIAGNOSIS — Z203 Contact with and (suspected) exposure to rabies: Secondary | ICD-10-CM

## 2017-08-11 DIAGNOSIS — Z79899 Other long term (current) drug therapy: Secondary | ICD-10-CM | POA: Insufficient documentation

## 2017-08-11 DIAGNOSIS — Y939 Activity, unspecified: Secondary | ICD-10-CM | POA: Insufficient documentation

## 2017-08-11 DIAGNOSIS — Y998 Other external cause status: Secondary | ICD-10-CM | POA: Insufficient documentation

## 2017-08-11 DIAGNOSIS — E1122 Type 2 diabetes mellitus with diabetic chronic kidney disease: Secondary | ICD-10-CM | POA: Insufficient documentation

## 2017-08-11 DIAGNOSIS — I509 Heart failure, unspecified: Secondary | ICD-10-CM | POA: Insufficient documentation

## 2017-08-11 DIAGNOSIS — Y92008 Other place in unspecified non-institutional (private) residence as the place of occurrence of the external cause: Secondary | ICD-10-CM | POA: Insufficient documentation

## 2017-08-11 DIAGNOSIS — W540XXA Bitten by dog, initial encounter: Secondary | ICD-10-CM | POA: Insufficient documentation

## 2017-08-11 DIAGNOSIS — Z2914 Encounter for prophylactic rabies immune globin: Secondary | ICD-10-CM | POA: Insufficient documentation

## 2017-08-11 DIAGNOSIS — F17210 Nicotine dependence, cigarettes, uncomplicated: Secondary | ICD-10-CM | POA: Insufficient documentation

## 2017-08-11 DIAGNOSIS — Z794 Long term (current) use of insulin: Secondary | ICD-10-CM | POA: Insufficient documentation

## 2017-08-11 DIAGNOSIS — Z7982 Long term (current) use of aspirin: Secondary | ICD-10-CM | POA: Insufficient documentation

## 2017-08-11 HISTORY — DX: Heart failure, unspecified: I50.9

## 2017-08-11 MED ORDER — AMOXICILLIN-POT CLAVULANATE 875-125 MG PO TABS
1.0000 | ORAL_TABLET | Freq: Once | ORAL | Status: AC
  Administered 2017-08-11: 1 via ORAL
  Filled 2017-08-11: qty 1

## 2017-08-11 MED ORDER — RABIES IMMUNE GLOBULIN 150 UNIT/ML IM INJ
20.0000 [IU]/kg | INJECTION | Freq: Once | INTRAMUSCULAR | Status: AC
  Administered 2017-08-11: 2475 [IU]
  Filled 2017-08-11: qty 16.5

## 2017-08-11 MED ORDER — LIDOCAINE HCL (PF) 1 % IJ SOLN
10.0000 mL | Freq: Once | INTRAMUSCULAR | Status: AC
  Administered 2017-08-11: 10 mL
  Filled 2017-08-11: qty 10

## 2017-08-11 MED ORDER — HYDROCODONE-ACETAMINOPHEN 5-325 MG PO TABS: ORAL_TABLET | ORAL | 0 refills | Status: DC

## 2017-08-11 MED ORDER — RABIES VACCINE, PCEC IM SUSR
1.0000 mL | Freq: Once | INTRAMUSCULAR | Status: AC
  Administered 2017-08-11: 1 mL via INTRAMUSCULAR
  Filled 2017-08-11: qty 1

## 2017-08-11 MED ORDER — AMOXICILLIN-POT CLAVULANATE 875-125 MG PO TABS: 1.0000 | ORAL_TABLET | Freq: Two times a day (BID) | ORAL | 0 refills | Status: DC

## 2017-08-11 NOTE — Discharge Instructions (Signed)
Please read and follow all provided instructions.  Your diagnoses today include:  1. Dog bite of right hand, initial encounter   2. Need for post exposure prophylaxis for rabies    Tests performed today include:  X-ray of the affected area that did not show any foreign bodies or broken bones  Vital signs. See below for your results today.   Medications prescribed:   Vicodin (hydrocodone/acetaminophen) - narcotic pain medication  DO NOT drive or perform any activities that require you to be awake and alert because this medicine can make you drowsy. BE VERY CAREFUL not to take multiple medicines containing Tylenol (also called acetaminophen). Doing so can lead to an overdose which can damage your liver and cause liver failure and possibly death.   Augmentin - antibiotic  You have been prescribed an antibiotic medicine: take the entire course of medicine even if you are feeling better. Stopping early can cause the antibiotic not to work.  Take any prescribed medications only as directed.   Home care instructions:  Follow any educational materials and wound care instructions contained in this packet.   Keep affected area above the level of your heart when possible to minimize swelling. Wash area gently twice a day with warm soapy water. Do not apply alcohol or hydrogen peroxide. Cover the area if it draining or weeping.   Follow-up instructions: Call Dr. Glenna Durandrtmann's office in the morning for follow-up appointment to have your hand checked.   Suture Removal: Follow-up with orthopedic hand surgeon   Return instructions:  Return to the Emergency Department if you have:  Fever  Worsening pain  Worsening swelling of the wound  Pus draining from the wound  Redness of the skin that moves away from the wound, especially if it streaks away from the affected area   Any other emergent concerns  Your vital signs today were: BP (!) 142/80    Pulse 93    Temp 97.8 F (36.6 C) (Oral)     Resp 18    Ht 5\' 4"  (1.626 m)    Wt 124.7 kg (275 lb)    SpO2 96%    BMI 47.20 kg/m  If your blood pressure (BP) was elevated above 135/85 this visit, please have this repeated by your doctor within one month. --------------

## 2017-08-11 NOTE — ED Provider Notes (Signed)
[facility] Provider Note   CSN: [id number] Arrival date & time: 08/11/17  1912     History   Chief Complaint Chief Complaint  Patient presents with  . Animal Bite    HPI [name] is a 57 y.o. female.  Patient with history of diabetes, last tetanus less than 5 years ago -- presents the emergency department with complaints of dog bite to the right hand sustained at approximately 6 AM today.  Patient states that she got between a cat and a dog that were on her porch and the dog bit her hand.  She rinsed it with water and then went to work after wrapping it.  She had pain in the hand all day.  No fevers, nausea, vomiting or weakness.  No treatment other treatments prior to arrival.  The dog is unknown.  She has seen the dog in the neighborhood before but does not know if it belongs to anyone in particular. The onset of this condition was acute. The course is constant. Aggravating factors: none. Alleviating factors: none. She reports difficulty with gripping and turning a doorknob.        Past Medical History:  Diagnosis Date  . BACK PAIN, LOW 08/29/2006  . CHF (congestive heart failure) (HCC)   . DEPRESSION, MAJOR, RECURRENT 08/29/2006  . DISORDER, BIPOLAR NOS 09/09/2006  . DMII (diabetes mellitus, type 2) (HCC) 09/19/2010  . Hyperlipidemia 09/19/2010   LDL 78 on 08/2010. Recheck 08/2011.    Marland Kitchen. HYPERTENSION, BENIGN SYSTEMIC 08/29/2006   Well controlled on Lopressor and lisinopril. Check BMET 12/2011    . Kidney infection   . OVARIAN MASS 09/18/2007   Benign ovarian cyst s/p laparotomy for removal   . TOBACCO DEPENDENCE 08/29/2006   Patient quit in early months of 2012. Continue to follow for possible relapse.    . Urosepsis 09/27/2012    Patient Active Problem List   Diagnosis Date Noted  . Acute left-sided low back pain with left-sided sciatica 02/21/2017  . Diabetes mellitus due to underlying condition, uncontrolled, with  hyperglycemia, with long-term current use of insulin (HCC) 10/03/2016  . Health care maintenance 03/31/2015  . Yeast infection 12/08/2014  . Recurrent kidney stones 10/03/2012  . 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 Obesity, Class III, BMI 40-49.9 (morbid obesity) (HCC) 11/08/2011  . Right knee pain 03/19/2011  . DMII (diabetes mellitus, type 2) (HCC) 09/19/2010  . Hyperlipidemia 09/19/2010  . DISORDER, BIPOLAR NOS 09/09/2006  . DEPRESSION, MAJOR, RECURRENT 08/29/2006  . TOBACCO DEPENDENCE 08/29/2006  . HYPERTENSION, BENIGN SYSTEMIC 08/29/2006  . VENOUS INSUFFICIENCY, CHRONIC 08/29/2006    Past Surgical History:  Procedure Laterality Date  . PARTIAL HYSTERECTOMY     unsure if had cervix removed.   . stents Bilateral    Kidneys    OB History    No data available       Home Medications    Prior to Admission medications   Medication Sig Start Date End Date Taking? Authorizing Provider  aspirin 81 MG EC tablet Take 81 mg by mouth daily.     [provider]  cephALEXin (KEFLEX) 500 MG capsule Take 1 capsule (500 mg total) by mouth 3 (three) times daily. 02/15/17   Charlynne PanderYao, David Hsienta, MD  cyclobenzaprine (FLEXERIL) 5 MG tablet Take 1 tablet (5 mg total) by mouth 3 (three) times daily as needed for muscle spasms. 03/08/17   Casey BurkittFitzgerald, Hillary Moen, MD  gabapentin (NEURONTIN) 300 MG capsule Take 1 capsule (300 mg total) by mouth 2 (two) times daily. 02/20/17   Casey Burkitt, MD  HYDROcodone-acetaminophen (NORCO/VICODIN) 5-325 MG tablet Take 2 tablets by mouth every 4 (four) hours as needed for moderate pain. 02/20/17   Casey Burkitt, MD  insulin NPH Human (HUMULIN N,NOVOLIN N) 100 UNIT/ML injection Inject 0.1-0.12 mLs (10-12 Units total) into the skin 2 (two) times daily. 12 units q am, 10 units q hs Patient taking differently: Inject 10-12 Units into the skin See admin instructions. 10 units q am,  12 units q hs 04/27/14   Reva Bores, MD  insulin regular (HUMULIN R) 100 units/mL injection Inject 0.05 mLs (5 Units total) into the skin 2 (two) times daily before a meal. Patient taking differently: Inject 8-10 Units into the skin 2 (two) times daily before a meal.  04/27/14   Reva Bores, MD  Multiple Vitamin (MULTIVITAMIN WITH MINERALS) TABS Take 1 tablet by mouth daily.    [provider]    Family History No family history on file.  Social History Social History   Tobacco Use  . Smoking status: Current Every Day Smoker    Packs/day: 1.00    Types: Cigarettes  . Smokeless tobacco: Never Used  Substance Use Topics  . Alcohol use: Yes  . Drug use: No     Allergies   Tamsulosin   Review of Systems Review of Systems  Constitutional: Negative for activity change and fever.  HENT: Negative for rhinorrhea and sore throat.   Eyes: Negative for redness.  Respiratory: Negative for cough.   Cardiovascular: Negative for chest pain.  Gastrointestinal: Negative for abdominal pain, diarrhea, nausea and vomiting.  Genitourinary: Negative for dysuria.  Musculoskeletal: Positive for myalgias. Negative for arthralgias, back pain, joint swelling and neck pain.  Skin: Positive for wound. Negative for rash.  Neurological: Positive for weakness and numbness. Negative for headaches.     Physical Exam Updated Vital Signs BP (!) 191/79 (BP Location: Left Arm)   Pulse (!) 105   Temp 97.8 F (36.6 C) (Oral)   Resp 18   Ht 5\' 4"  (1.626 m)   Wt 124.7 kg (275 lb)   SpO2 98%   BMI 47.20 kg/m   Physical Exam  Constitutional: She appears well-developed and well-nourished.  HENT:  Head: Normocephalic and atraumatic.  Eyes: Pupils are equal, round, and reactive to light.  Neck: Normal range of motion. Neck supple.  Cardiovascular: Normal pulses. Exam reveals no decreased pulses.  Musculoskeletal: She exhibits tenderness. She exhibits no edema.  Neurological: She is  alert. No sensory deficit.  Patient with 5 cm laceration noted to the thenar eminence of the right hand.  Wound is gaping with expelled subcutaneous tissue.  Unable to fully visualize the base of the wound due to depth of about 2 cm.   Patient reports numbness on the radial aspect of the thumb but not the ulnar aspect.  She is not able to fully oppose the thumb, however does report pain with this.  She reports having difficulty gripping and turning a doorknob.  There are some other minor abrasions to the dorsal aspect of the hand consistent with bite wound.  Skin: Skin is warm and dry.  Psychiatric: She has a normal mood and affect.  Nursing note and vitals reviewed.        ED Treatments / Results   Radiology Dg Hand Complete Right  Result Date: 08/11/2017 CLINICAL DATA:  Dog  bite EXAM: RIGHT HAND - COMPLETE 3+ VIEW COMPARISON:  None. FINDINGS: Frontal, oblique, and lateral views were obtained. There is soft tissue swelling volar to the first metacarpal. No radiopaque foreign body. No fracture or dislocation. No erosive change or bony destruction. Joint spaces appear unremarkable except for mild osteoarthritic change in the first IP and fifth DIP joints. No erosive change. IMPRESSION: Soft tissue edema in the soft tissues in the region of the first metacarpal. No radiopaque foreign body. Mild osteoarthritic change in the first IP and fifth DIP joints. No fracture or dislocation. No bony destruction. Electronically Signed   By: Bretta Bang III M.D.   On: 08/11/2017 20:14    Procedures .Marland KitchenLaceration Repair Date/Time: 08/11/2017 10:52 PM Performed by: Renne Crigler, PA-C Authorized by: Renne Crigler, PA-C   Consent:    Consent obtained:  Verbal   Consent given by:  Patient   Risks discussed:  Infection, need for additional repair, pain, poor cosmetic result, poor wound healing, nerve damage, vascular damage, tendon damage and retained foreign body   Alternatives discussed:   Delayed treatment Anesthesia (see MAR for exact dosages):    Anesthesia method:  Local infiltration   Local anesthetic:  Lidocaine 1% w/o epi Laceration details:    Location:  Hand   Hand location:  R palm   Length (cm):  5   Depth (mm):  20 Repair type:    Repair type:  Intermediate Pre-procedure details:    Preparation:  Patient was prepped and draped in usual sterile fashion and imaging obtained to evaluate for foreign bodies Exploration:    Hemostasis achieved with:  Direct pressure   Wound exploration: wound explored through full range of motion     Wound exploration comment:  Unable to fully visualize base given orientaiton of wound   Wound extent: muscle damage     Wound extent: no underlying fracture noted  Nerve damage: unclear. Tendon damage: unclear.   Treatment:    Area cleansed with:  Saline and Shur-Clens   Amount of cleaning:  Extensive   Irrigation solution:  Sterile saline   Irrigation volume:  1000cc   Irrigation method:  Pressure wash   Visualized foreign bodies/material removed: no   Skin repair:    Repair method:  Sutures   Suture size:  3-0   Suture material:  Plain gut   Suture technique:  Simple interrupted   Number of sutures:  3 Approximation:    Approximation:  Loose Post-procedure details:    Patient tolerance of procedure:  Tolerated well, no immediate complications   (including critical care time)  Medications Ordered in ED Medications  amoxicillin-clavulanate (AUGMENTIN) 875-125 MG per tablet 1 tablet (1 tablet Oral Given 08/11/17 1958)  lidocaine (PF) (XYLOCAINE) 1 % injection 10 mL (10 mLs Infiltration Given 08/11/17 1958)  rabies immune globulin (HYPERAB/KEDRAB) injection 2,475 Units (2,475 Units Infiltration Given 08/11/17 2116)  rabies vaccine (RABAVERT) injection 1 mL (1 mL Intramuscular Given 08/11/17 2116)     Initial Impression / Assessment and Plan / ED Course  I have reviewed the triage vital signs and the nursing  notes.  Pertinent labs & imaging results that were available during my care of the patient were reviewed by me and considered in my medical decision making (see chart for details).     Patient seen and examined. Work-up initiated. Medications ordered.  Augmentin ordered.  Patient will need a rabies vaccine.  Will irrigate and repair wound.  Discussed risks of infection.  Vital signs reviewed  and are as follows: BP (!) 191/79 (BP Location: Left Arm)   Pulse (!) 105   Temp 97.8 F (36.6 C) (Oral)   Resp 18   Ht 5\' 4"  (1.626 m)   Wt 124.7 kg (275 lb)   SpO2 98%   BMI 47.20 kg/m   X-ray negative.  Will explore wound.  Wound anesthetized and irrigated with 1000 cc normal saline.  Upon exploration, wound found to be fairly deep extending approximately 2 cm.  Wound explored further with Dr. Donnald Garre at the bedside.  Given extended time, approximately 14 hours, since wound occurred, patient at high risk given diabetes and depth of wound, also reported mild weakness and inability to visualize base of wound, I spoke with orthopedic hand surgery.  I spoke with Dr. Aundria Rud who is on-call tonight.  Recommends good irrigation, antibiotics.  Recommends partial but not full closure with stitches.  Plan is for patient to call office in the morning and be seen by Dr. Melvyn Novas most likely, in the next 1-2 days.  Patient and family updated on plan.  Wound closed as above.  Patient has received rabies prophylaxis.  Patient counseled on wound care. Patient was urged to return to the Emergency Department urgently with worsening pain, swelling, expanding erythema especially if it streaks away from the affected area, fever, or if they have any other concerns. Patient verbalized understanding.    Final Clinical Impressions(s) / ED Diagnoses   Final diagnoses:  Dog bite of right hand, initial encounter  Need for post exposure prophylaxis for rabies   Patient with complex dog bite of the right hand.  She  is diabetic and presented greater than 12 hours after bite.  Wound is deep.  Unclear if there is any tendon injury or significant nerve injury given difficulties fully exploring the wound.  X-rays are negative.  Patient is up-to-date on tetanus.  She was treated with rabies prophylaxis and encouraged to follow-up with animal control.  Discussed need for additional rabies vaccines and when to present.  I spoke with orthopedic surgery tonight to discuss plan.  Patient to follow-up in orthopedic office this week for close monitoring and reevaluation.  Patient was given Augmentin in emergency department and prescription for home.  Wound repair as above.  No indications for admission at this time.  ED Discharge Orders        Ordered    HYDROcodone-acetaminophen (NORCO/VICODIN) 5-325 MG tablet     08/11/17 2235    amoxicillin-clavulanate (AUGMENTIN) 875-125 MG tablet  Every 12 hours     08/11/17 2235       Renne Crigler, PA-C 08/11/17 2256    Arby Barrette, MD 08/12/17 0001

## 2017-08-11 NOTE — ED Triage Notes (Signed)
Pt reports trying to break up fight between a cat and dog, the dog bit pt R hand. Large wound noted to palm with adipose tissue exposed. Scratches and puncture wound noted to top of R hand. Tetanus UTD. Dog was random, unsure of vaccination status

## 2017-09-16 IMAGING — CT CT RENAL STONE PROTOCOL
2 of 4 series · 16 of 46 positions shown, 18 images · non-contrast
Comparison: CT of the abdomen and pelvis performed 05/22/2013, and
renal ultrasound performed earlier today at [DATE] a.m.

CLINICAL DATA: Acute onset of left flank pain and dysuria. Initial
encounter.

EXAM:
CT ABDOMEN AND PELVIS WITHOUT CONTRAST
TECHNIQUE: Multidetector CT imaging of the abdomen and pelvis was performed
following the standard protocol without IV contrast.

[Series 2: renal stone 5mm · axial · 0.88mm/px · z∈[-536,-86]mm · 13 of 100 slices shown, 15 images]
[im 5/100  soft-tissue]
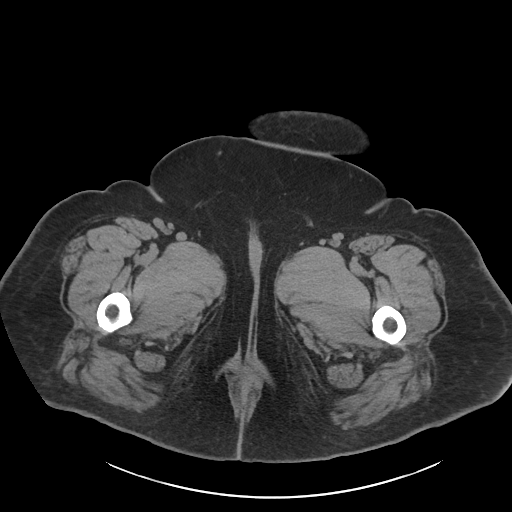
[im 5/100  bone]
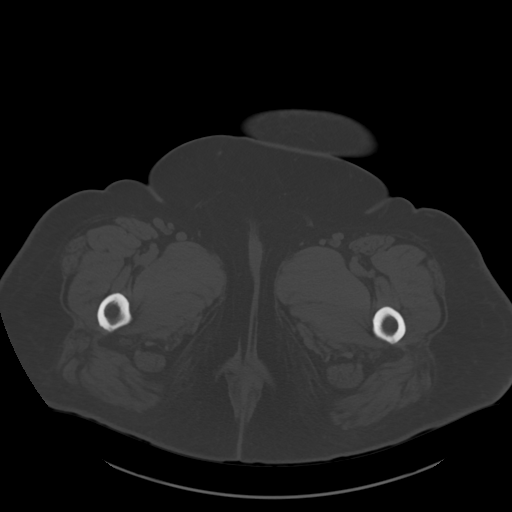
[im 13/100  soft-tissue]
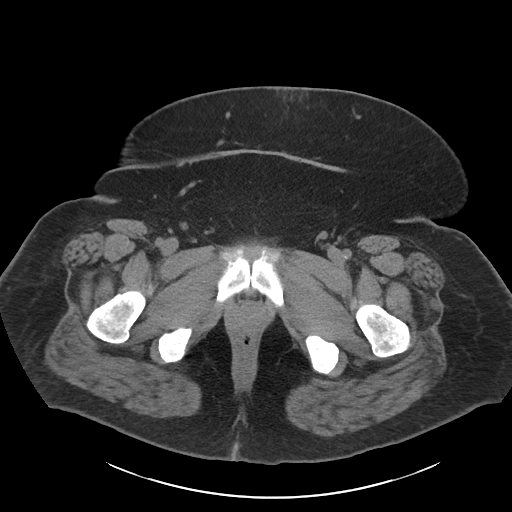
[im 22/100  soft-tissue]
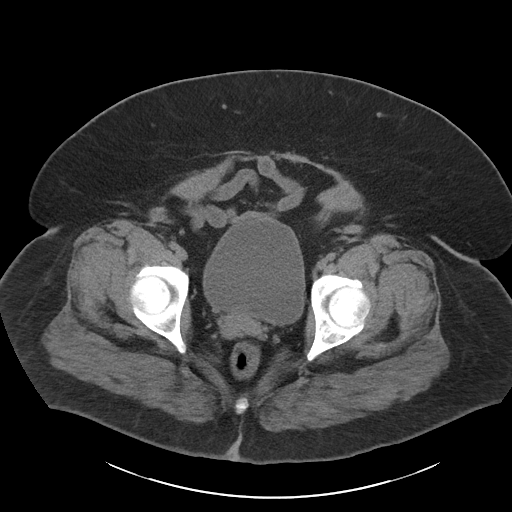
[im 26/100  soft-tissue]
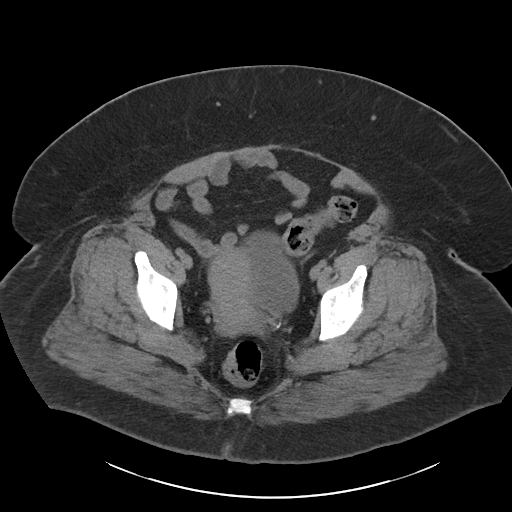
[im 35/100  soft-tissue]
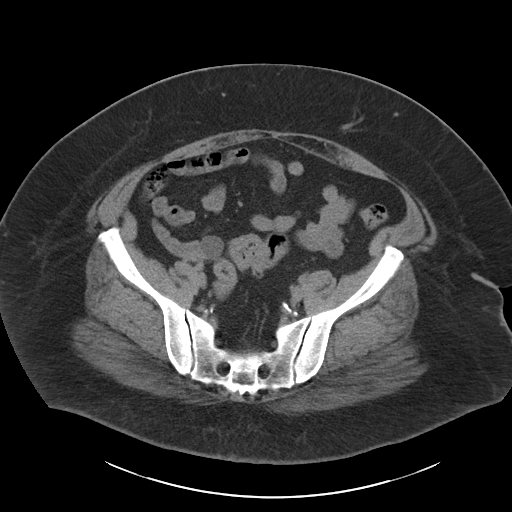
[im 44/100  soft-tissue]
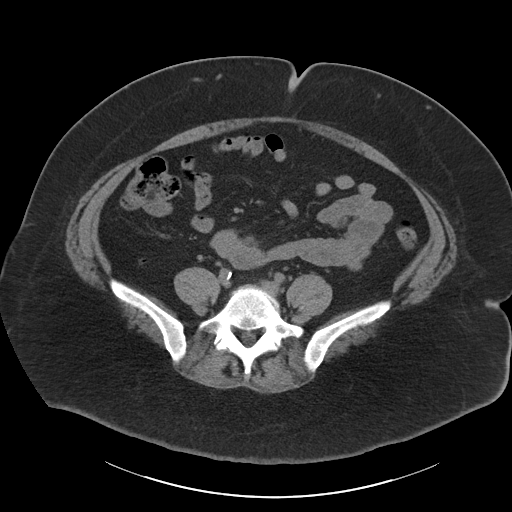
[im 52/100  soft-tissue]
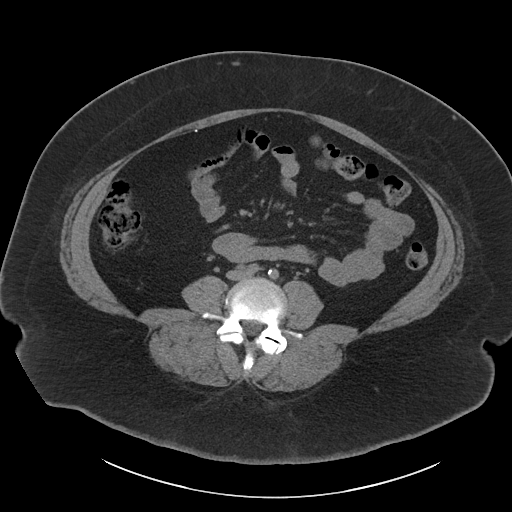
[im 56/100  soft-tissue]
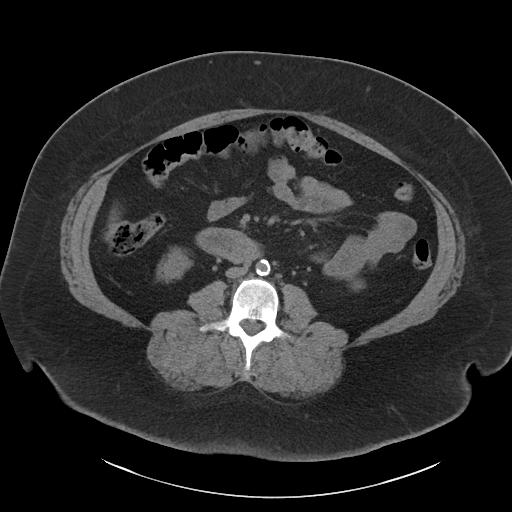
[im 65/100  soft-tissue]
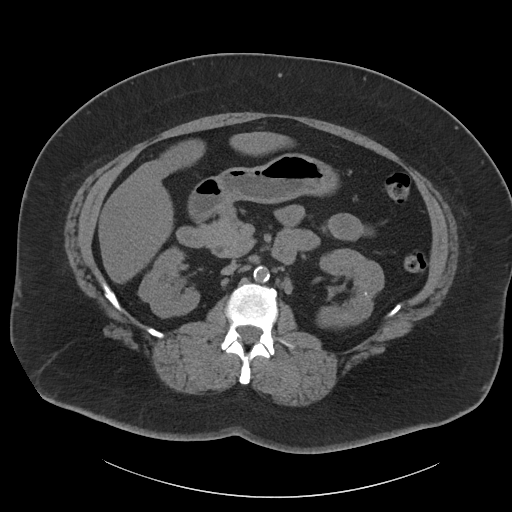
[im 65/100  bone]
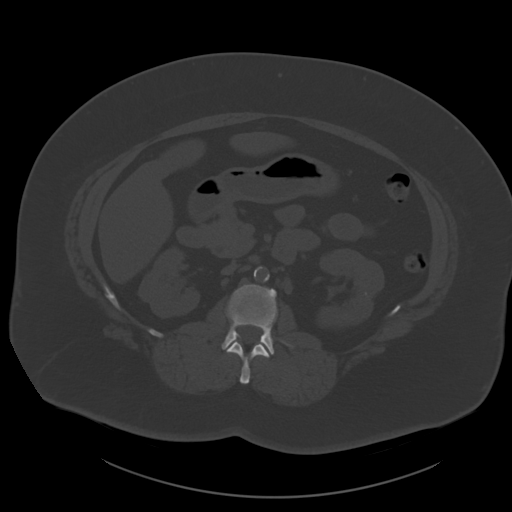
[im 74/100  soft-tissue]
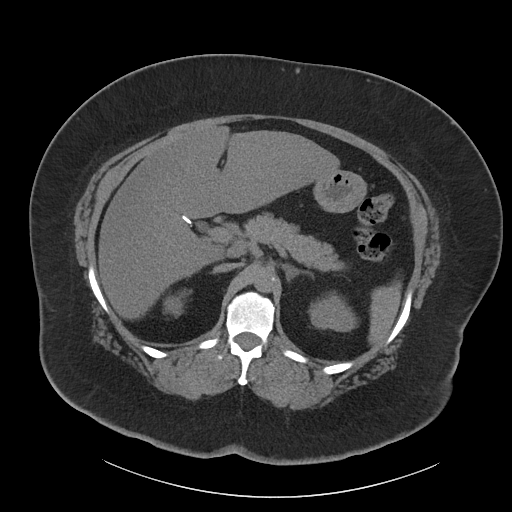
[im 78/100  soft-tissue]
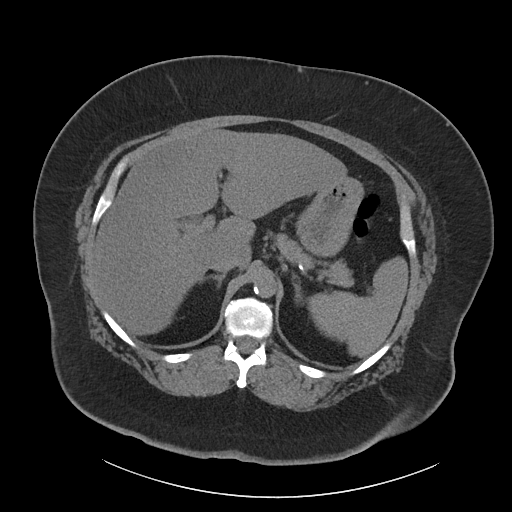
[im 87/100  soft-tissue]
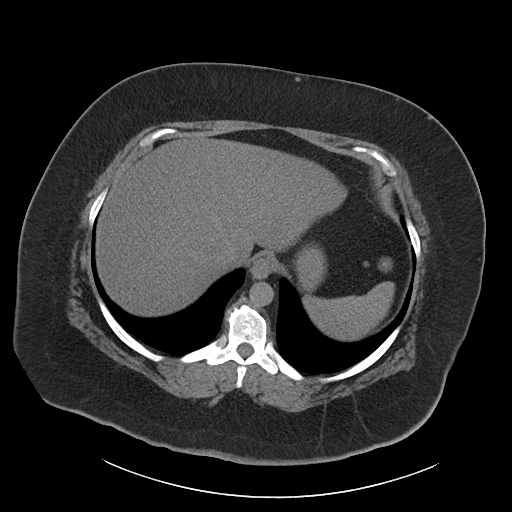
[im 95/100  soft-tissue]
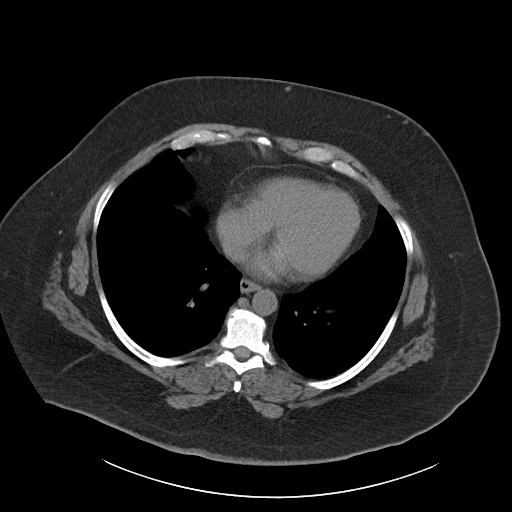

[Series 4: renal stone 3.0 cor · coronal · 0.90mm/px · 3 of 111 slices shown]
[im 37/111  soft-tissue]
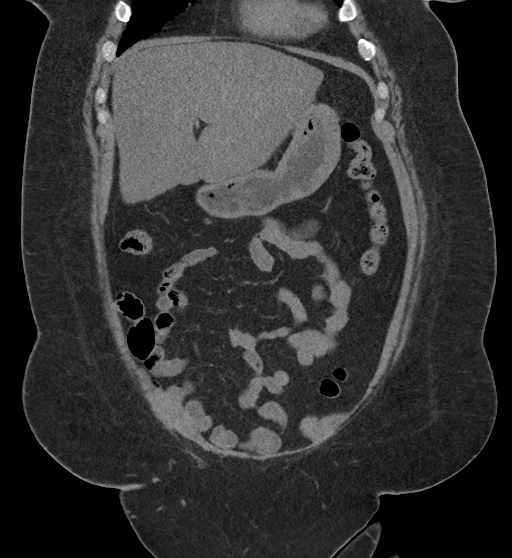
[im 49/111  soft-tissue]
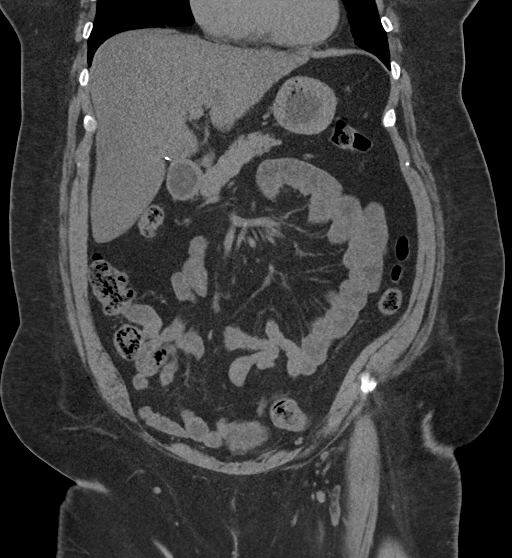
[im 62/111  soft-tissue]
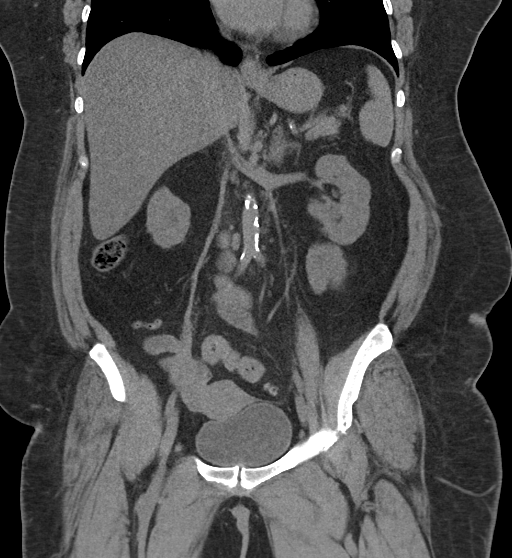

[16 of 46 positions shown; findings below may reference images not displayed]

FINDINGS: The visualized lung bases are clear.

The liver and spleen are unremarkable in appearance. The patient is
status post cholecystectomy, with clips noted at the gallbladder
fossa. The pancreas and adrenal glands are unremarkable.

Scattered bilateral renal parenchymal calcifications are seen, with
scattered small bilateral renal stones seen. A few small bilateral
renal cysts are seen. There is no evidence of hydronephrosis. No
obstructing ureteral stones are identified. Mild nonspecific
perinephric stranding is noted bilaterally.

No free fluid is identified. The small bowel is unremarkable in
appearance. The stomach is within normal limits. No acute vascular
abnormalities are seen. Mild calcification is seen along the
abdominal aorta and its branches.

The appendix is normal in caliber, without evidence of appendicitis.
The colon is unremarkable in appearance.

The bladder is mildly distended and grossly unremarkable. The
remaining uterus is unremarkable in appearance. No suspicious
adnexal masses are seen. No inguinal lymphadenopathy is seen.

No acute osseous abnormalities are identified. Mild underlying facet
disease is noted along the lower lumbar spine.
IMPRESSION: 1. No acute abnormality seen to explain the patient's symptoms.
2. Nonobstructing small bilateral renal stones noted. Scattered
bilateral renal parenchymal calcifications seen.
3. Few small bilateral renal cysts seen.
4. Mild calcification along the abdominal aorta and its branches.

## 2017-10-21 ENCOUNTER — Encounter (HOSPITAL_COMMUNITY): Payer: Self-pay

## 2017-10-21 ENCOUNTER — Encounter: Payer: Self-pay | Admitting: Family Medicine

## 2017-10-21 ENCOUNTER — Emergency Department (HOSPITAL_COMMUNITY)
Admission: EM | Admit: 2017-10-21 | Discharge: 2017-10-21 | Disposition: A | Discharge status: Other Acute Inpatient Hospital | Payer: Self-pay | Attending: Emergency Medicine | Admitting: Emergency Medicine

## 2017-10-21 ENCOUNTER — Ambulatory Visit (INDEPENDENT_AMBULATORY_CARE_PROVIDER_SITE_OTHER): Payer: Self-pay | Admitting: Family Medicine

## 2017-10-21 ENCOUNTER — Other Ambulatory Visit: Payer: Self-pay

## 2017-10-21 VITALS — BP 172/92 | HR 126 | Temp 98.1°F | Wt 264.8 lb

## 2017-10-21 DIAGNOSIS — I509 Heart failure, unspecified: Secondary | ICD-10-CM | POA: Insufficient documentation

## 2017-10-21 DIAGNOSIS — N12 Tubulo-interstitial nephritis, not specified as acute or chronic: Secondary | ICD-10-CM | POA: Insufficient documentation

## 2017-10-21 DIAGNOSIS — F17210 Nicotine dependence, cigarettes, uncomplicated: Secondary | ICD-10-CM | POA: Insufficient documentation

## 2017-10-21 DIAGNOSIS — Z7982 Long term (current) use of aspirin: Secondary | ICD-10-CM | POA: Insufficient documentation

## 2017-10-21 DIAGNOSIS — I130 Hypertensive heart and chronic kidney disease with heart failure and stage 1 through stage 4 chronic kidney disease, or unspecified chronic kidney disease: Secondary | ICD-10-CM | POA: Insufficient documentation

## 2017-10-21 DIAGNOSIS — R300 Dysuria: Secondary | ICD-10-CM

## 2017-10-21 DIAGNOSIS — N182 Chronic kidney disease, stage 2 (mild): Secondary | ICD-10-CM | POA: Insufficient documentation

## 2017-10-21 DIAGNOSIS — Z794 Long term (current) use of insulin: Secondary | ICD-10-CM | POA: Insufficient documentation

## 2017-10-21 DIAGNOSIS — E1122 Type 2 diabetes mellitus with diabetic chronic kidney disease: Secondary | ICD-10-CM | POA: Insufficient documentation

## 2017-10-21 DIAGNOSIS — Z79899 Other long term (current) drug therapy: Secondary | ICD-10-CM | POA: Insufficient documentation

## 2017-10-21 LAB — URINALYSIS, ROUTINE W REFLEX MICROSCOPIC
Bilirubin Urine: NEGATIVE
Glucose, UA: 50 mg/dL — AB
Ketones, ur: NEGATIVE mg/dL
Nitrite: POSITIVE — AB
PH: 6 (ref 5.0–8.0)
PROTEIN: NEGATIVE mg/dL
RBC / HPF: NONE SEEN RBC/hpf (ref 0–5)
Specific Gravity, Urine: 1.014 (ref 1.005–1.030)

## 2017-10-21 LAB — POCT URINALYSIS DIP (MANUAL ENTRY)
BILIRUBIN UA: NEGATIVE
Glucose, UA: 100 mg/dL — AB
NITRITE UA: POSITIVE — AB
PH UA: 6.5 (ref 5.0–8.0)
Spec Grav, UA: 1.02 (ref 1.010–1.025)
Urobilinogen, UA: 0.2 E.U./dL

## 2017-10-21 LAB — COMPREHENSIVE METABOLIC PANEL
ALBUMIN: 3.9 g/dL (ref 3.5–5.0)
ALK PHOS: 144 U/L — AB (ref 38–126)
ALT: 28 U/L (ref 14–54)
AST: 25 U/L (ref 15–41)
Anion gap: 12 (ref 5–15)
BILIRUBIN TOTAL: 0.9 mg/dL (ref 0.3–1.2)
BUN: 11 mg/dL (ref 6–20)
CALCIUM: 10.8 mg/dL — AB (ref 8.9–10.3)
CO2: 21 mmol/L — AB (ref 22–32)
CREATININE: 1.08 mg/dL — AB (ref 0.44–1.00)
Chloride: 103 mmol/L (ref 101–111)
GFR calc Af Amer: >60 mL/min (ref >60)
GFR calc non Af Amer: 57 mL/min — ABNORMAL LOW (ref >60)
GLUCOSE: 196 mg/dL — AB (ref 65–99)
Potassium: 4.1 mmol/L (ref 3.5–5.1)
SODIUM: 136 mmol/L (ref 135–145)
TOTAL PROTEIN: 8 g/dL (ref 6.5–8.1)

## 2017-10-21 LAB — CBC
HCT: 48.7 % — ABNORMAL HIGH (ref 36.0–46.0)
Hemoglobin: 16.7 g/dL — ABNORMAL HIGH (ref 12.0–15.0)
MCH: 31.2 pg (ref 26.0–34.0)
MCHC: 34.3 g/dL (ref 30.0–36.0)
MCV: 91 fL (ref 78.0–100.0)
Platelets: 291 10*3/uL (ref 150–400)
RBC: 5.35 MIL/uL — ABNORMAL HIGH (ref 3.87–5.11)
RDW: 12.9 % (ref 11.5–15.5)
WBC: 11.9 10*3/uL — ABNORMAL HIGH (ref 4.0–10.5)

## 2017-10-21 LAB — I-STAT BETA HCG BLOOD, ED (MC, WL, AP ONLY): I-stat hCG, quantitative: 7.5 m[IU]/mL — ABNORMAL HIGH (ref <5)

## 2017-10-21 LAB — POCT UA - MICROSCOPIC ONLY

## 2017-10-21 LAB — LIPASE, BLOOD: Lipase: 32 U/L (ref 11–51)

## 2017-10-21 MED ORDER — KETOROLAC TROMETHAMINE 30 MG/ML IJ SOLN
30.0000 mg | Freq: Once | INTRAMUSCULAR | Status: AC
  Administered 2017-10-21: 30 mg via INTRAVENOUS
  Filled 2017-10-21: qty 1

## 2017-10-21 MED ORDER — CEPHALEXIN 250 MG PO CAPS
500.0000 mg | ORAL_CAPSULE | Freq: Once | ORAL | Status: AC
  Administered 2017-10-21: 500 mg via ORAL
  Filled 2017-10-21: qty 2

## 2017-10-21 MED ORDER — ONDANSETRON HCL 4 MG PO TABS: 4.0000 mg | ORAL_TABLET | Freq: Four times a day (QID) | ORAL | 0 refills | Status: DC

## 2017-10-21 MED ORDER — CEPHALEXIN 500 MG PO CAPS: 500.0000 mg | ORAL_CAPSULE | Freq: Three times a day (TID) | ORAL | 0 refills | Status: DC

## 2017-10-21 MED ORDER — HYDROCORTISONE 0.5 % EX CREA: 1.0000 "application " | TOPICAL_CREAM | Freq: Two times a day (BID) | CUTANEOUS | 0 refills | Status: DC

## 2017-10-21 MED ORDER — SODIUM CHLORIDE 0.9 % IV BOLUS
500.0000 mL | Freq: Once | INTRAVENOUS | Status: AC
  Administered 2017-10-21: 500 mL via INTRAVENOUS

## 2017-10-21 MED ORDER — OXYCODONE-ACETAMINOPHEN 5-325 MG PO TABS
1.0000 | ORAL_TABLET | Freq: Once | ORAL | Status: AC
  Administered 2017-10-21: 1 via ORAL
  Filled 2017-10-21: qty 1

## 2017-10-21 NOTE — ED Provider Notes (Signed)
  Physical Exam  BP (!) 119/57 (BP Location: Left Arm)   Pulse 91   Temp 97.9 F (36.6 C) (Oral)   Resp 16   SpO2 96%   Physical Exam  Constitutional: She appears well-developed and well-nourished. No distress.  Resting comfortably in no acute distress.  HENT:  Head: Normocephalic and atraumatic.  Eyes: Conjunctivae and EOM are normal. No scleral icterus.  Neck: Normal range of motion.  Pulmonary/Chest: Effort normal. No respiratory distress.  Neurological: She is alert.  Skin: No rash noted. She is not diaphoretic.  Psychiatric: She has a normal mood and affect.  Nursing note and vitals reviewed.   ED Course/Procedures     Procedures  MDM  Care handed off from previous provider J.Hedges, PA-C.  Please see their note for further detail.  Briefly, patient presents to ED for complaints of lower back pain, dysuria for 3 weeks.  Urinalysis positive for UTI with positive nitrites, leukocytes and WBCs.  Remainder of lab work unremarkable.  Questionable pyelonephritis as she has had this in the past.  She is afebrile here.  Mildly tachycardic at 120s, hypertensive with systolics up to 200s..  This has improved with supportive measures including fluids and pain medication. Plan is to complete fluid bolus and recheck heart rate and symptom improvement.  On recheck, patient resting comfortably.  Heart rate improved to 91 and systolic BP improved to 119.  Patient is comfortable with discharge home with remainder of antibiotic course and antiemetics.  I did advise her to follow-up with her primary care provider for further evaluation and to return to the ED for any severe or worsening symptoms.  Portions of this note were generated with Scientist, clinical (histocompatibility and immunogenetics)Dragon dictation software. Dictation errors may occur despite best attempts at proofreading.    Dietrich PatesKhatri, Kalyn Hofstra, PA-C 10/21/17 2257    Shaune PollackIsaacs, [place], MD 10/22/17 [phone]

## 2017-10-21 NOTE — Progress Notes (Signed)
Date of Visit: 10/21/2017   HPI:  Patient presents for a same day appointment to discuss back pain and abdominal pain.  Has had back pain for months, located all over her back. Began to feel worse in the last couple of days. Last night began having generalized abdominal pain as well, that almost prompted her to go to ED in middle of night. Feels bloated, gassy. Has had some diarrhea and burping today. No vomiting but has felt nauseated. Decreased PO intake but is drinking ok. No fevers. Has had urinary frequency, burning, and malodorous urine recently. Was incontinent of stool today, couldn't make it to bathroom in time with diarrhea.   Rash on shin - has had rash on anterior shins for many months. Tried a variety of over the counter remedies without relief. Itchy. Thought it was due to getting a new comforter but has persisted.  Notably patient has history of diabetes but has not followed up for diabetes or chronic medical problems in some time.   ROS: See HPI  PMFSH: history of type 2 diabetes, recurrent nephrolithiasis, hypertension, CKD2, hyperlipidemia, depression, bipolar disorder, chronic venous insufficiency  PHYSICAL EXAM: BP (!) 172/92   Pulse (!) 126   Temp 98.1 F (36.7 C) (Oral)   Wt 264 lb 12.8 oz (120.1 kg)   SpO2 95%   BMI 45.45 kg/m  Gen: no acute distress, cooperative HEENT: normocephalic, atraumatic, moist mucous membranes  Heart: tachycardic, regular rhythm. No murmurs. Lungs: clear bilaterally, normal effort Abdomen: normoactive bowel sounds. Abdomen moderately tender throughout. No masses palpable.  Neuro: alert, grossly nonfocal, speech normal Extremities: erythematous macular rash to anterior shins with some excoriation, no signs of superinfection Back: back musculature tender to palpation throughout, + CVA tenderness bilaterally though exam difficult as she was tender to light touch throughout as well.  ASSESSMENT/PLAN:  57 yo F with history of diabetes  presenting with back pain and abdominal pain as well as urinary symptoms. Primary concern is for pyelonephritis at this time. UA and urine micro appear grossly infected in clinic today. Of concern is her heart rate of 126. with source of infection and tachycardia, if patient has leukocytosis then she would meet sepsis criteria. I do think imaging of her abdomen is prudent given the degree of tenderness and tachycardia. Discussed options for outpatient workup and treatment versus expedited evaluation in the ED today, and patient elected to go to the ED via private vehicle.  Rash on shins appears to possibly be venous stasis dermatitis. Will rx hydrocortisone. Follow up with PCP if not improving.  Stressed importance of following up with PCP for diabetes and other chronic medical problems after this acute issue is resolved.  GrenadaBrittany J. Pollie MeyerMcIntyre, MD [facility]

## 2017-10-21 NOTE — ED Triage Notes (Signed)
PT presents to ED from [facility] with complaints of lower back pain, abdominal pain, dysuria x 3 weeks. PT UA showed uti in office

## 2017-10-21 NOTE — ED Notes (Signed)
Signature pad unavailable at time of discharge. Pt verbalized understanding of instructions and prescriptions.

## 2017-10-21 NOTE — ED Provider Notes (Signed)
[facility] EMERGENCY DEPARTMENT Provider Note   CSN: [id number] Arrival date & time: 10/21/17  1440     History   Chief Complaint Chief Complaint  Patient presents with  . Urinary Tract Infection    HPI [name] is a 57 y.o. female.  HPI   57 year old female presents today with complaints of dysuria and back pain.  Patient notes history of chronic back pain and notes over the last several months she has had consistent bilateral lower back pain.  She notes the back pain is worse with movement worse with palpation, denies any distal neurological complaints.  Patient notes that over the last several days she has had pain with urinating, some nausea no vomiting, no fever.  She notes some intermittent epigastric discomfort nonfocal.  Patient denies any vaginal discharge or bleeding.  Patient notes taking Tums earlier today.  Patient reports significant past medical history of cholecystectomy.  She also notes history of recurring urinary tract infections.  She denies any recent antibiotic usage.   Past Medical History:  Diagnosis Date  . BACK PAIN, LOW 08/29/2006  . CHF (congestive heart failure) (HCC)   . DEPRESSION, MAJOR, RECURRENT 08/29/2006  . DISORDER, BIPOLAR NOS 09/09/2006  . DMII (diabetes mellitus, type 2) (HCC) 09/19/2010  . Hyperlipidemia 09/19/2010   LDL 78 on 08/2010. Recheck 08/2011.    Marland Kitchen HYPERTENSION, BENIGN SYSTEMIC 08/29/2006   Well controlled on Lopressor and lisinopril. Check BMET 12/2011    . Kidney infection   . OVARIAN MASS 09/18/2007   Benign ovarian cyst s/p laparotomy for removal   . TOBACCO DEPENDENCE 08/29/2006   Patient quit in early months of 2012. Continue to follow for possible relapse.    . Urosepsis 09/27/2012    Patient Active Problem List   Diagnosis Date Noted  . Acute left-sided low back pain with left-sided sciatica 02/21/2017  . Diabetes mellitus due to underlying condition, uncontrolled, with hyperglycemia, with  long-term current use of insulin (HCC) 10/03/2016  . Health care maintenance 03/31/2015  . Yeast infection 12/08/2014  . Recurrent kidney stones 10/03/2012  . 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 Obesity, Class III, BMI 40-49.9 (morbid obesity) (HCC) 11/08/2011  . Right knee pain 03/19/2011  . DMII (diabetes mellitus, type 2) (HCC) 09/19/2010  . Hyperlipidemia 09/19/2010  . DISORDER, BIPOLAR NOS 09/09/2006  . DEPRESSION, MAJOR, RECURRENT 08/29/2006  . TOBACCO DEPENDENCE 08/29/2006  . HYPERTENSION, BENIGN SYSTEMIC 08/29/2006  . VENOUS INSUFFICIENCY, CHRONIC 08/29/2006    Past Surgical History:  Procedure Laterality Date  . PARTIAL HYSTERECTOMY     unsure if had cervix removed.   . stents Bilateral    Kidneys     OB History   None      Home Medications    Prior to Admission medications   Medication Sig Start Date End Date Taking? Authorizing Provider  aspirin 81 MG EC tablet Take 81 mg by mouth daily.    Yes [provider]  Bismuth Subsalicylate (PEPTO-BISMOL) 262 MG TABS Take 1-2 tablets by mouth every 6 (six) hours as needed (for gas or "pressure").   Yes [provider]  calcium carbonate (TUMS - DOSED IN MG ELEMENTAL CALCIUM) 500 MG chewable tablet Chew 1-2 tablets by mouth as needed for indigestion or heartburn.   Yes [provider]  insulin NPH Human (HUMULIN N,NOVOLIN N) 100 UNIT/ML injection Inject 0.1-0.12 mLs (10-12 Units total) into the skin 2 (two) times daily. 12  units q am, 10 units q hs Patient taking differently: Inject 10-12 Units into the skin See admin instructions. Inject 10 units into the skin before breakfast and 10 units after supper 04/27/14  Yes Reva Bores, MD  insulin regular (HUMULIN R) 100 units/mL injection Inject 0.05 mLs (5 Units total) into the skin 2 (two) times daily before a meal. Patient taking differently: Inject 10 Units into the skin 2 (two) times  daily before a meal.  04/27/14  Yes Reva Bores, MD  Multiple Vitamin (MULTIVITAMIN WITH MINERALS) TABS Take 1 tablet by mouth daily.   Yes [provider]  simethicone (GAS-X EXTRA STRENGTH) 125 MG chewable tablet Chew 125 mg by mouth every 6 (six) hours as needed for flatulence.   Yes [provider]  amoxicillin-clavulanate (AUGMENTIN) 875-125 MG tablet Take 1 tablet by mouth every 12 (twelve) hours. Patient not taking: Reported on 10/21/2017 08/11/17   Renne Crigler, PA-C  cephALEXin (KEFLEX) 500 MG capsule Take 1 capsule (500 mg total) by mouth 3 (three) times daily. 10/21/17   Velora Horstman, Tinnie Gens, PA-C  HYDROcodone-acetaminophen (NORCO/VICODIN) 5-325 MG tablet Take 1-2 tablets every 6 hours as needed for severe pain Patient not taking: Reported on 10/21/2017 08/11/17   Renne Crigler, PA-C  hydrocortisone cream 0.5 % Apply 1 application topically 2 (two) times daily. 10/21/17   Latrelle Dodrill, MD  ondansetron (ZOFRAN) 4 MG tablet Take 1 tablet (4 mg total) by mouth every 6 (six) hours. 10/21/17   Eyvonne Mechanic, PA-C    Family History No family history on file.  Social History Social History   Tobacco Use  . Smoking status: Current Every Day Smoker    Packs/day: 1.00    Types: Cigarettes  . Smokeless tobacco: Never Used  Substance Use Topics  . Alcohol use: Yes  . Drug use: No    Allergies   Tamsulosin  Review of Systems Review of Systems  All other systems reviewed and are negative.  Physical Exam Updated Vital Signs BP 137/82   Pulse (!) 101   Temp 97.9 F (36.6 C) (Oral)   Resp 18   SpO2 97%   Physical Exam  Constitutional: She is oriented to person, place, and time. She appears well-developed and well-nourished.  HENT:  Head: Normocephalic and atraumatic.  Eyes: Pupils are equal, round, and reactive to light. Conjunctivae are normal. Right eye exhibits no discharge. Left eye exhibits no discharge. No scleral icterus.  Neck: Normal range of  motion. No JVD present. No tracheal deviation present.  Pulmonary/Chest: Effort normal. No stridor.  Abdominal: Soft. She exhibits no mass. There is tenderness. There is no guarding. No hernia.  Minimal epigastric TTP- minor ttp of bilateral upper lumbar and lower thoracic musculature - no lower abd TTP  Neurological: She is alert and oriented to person, place, and time. Coordination normal.  Psychiatric: She has a normal mood and affect. Her behavior is normal. Judgment and thought content normal.  Nursing note and vitals reviewed.    ED Treatments / Results  Labs (all labs ordered are listed, but only abnormal results are displayed) Labs Reviewed  COMPREHENSIVE METABOLIC PANEL - Abnormal; Notable for the following components:      Result Value   CO2 21 (*)    Glucose, Bld 196 (*)    Creatinine, Ser 1.08 (*)    Calcium 10.8 (*)    Alkaline Phosphatase 144 (*)    GFR calc non Af Amer 57 (*)    All other components  within normal limits  CBC - Abnormal; Notable for the following components:   WBC 11.9 (*)    RBC 5.35 (*)    Hemoglobin 16.7 (*)    HCT 48.7 (*)    All other components within normal limits  URINALYSIS, ROUTINE W REFLEX MICROSCOPIC - Abnormal; Notable for the following components:   APPearance HAZY (*)    Glucose, UA 50 (*)    Hgb urine dipstick SMALL (*)    Nitrite POSITIVE (*)    Leukocytes, UA LARGE (*)    Bacteria, UA MANY (*)    Squamous Epithelial / LPF 0-5 (*)    All other components within normal limits  I-STAT BETA HCG BLOOD, ED ([facility], [facility], [facility] ONLY) - Abnormal; Notable for the following components:   I-stat hCG, quantitative 7.5 (*)    All other components within normal limits  URINE CULTURE  LIPASE, BLOOD    EKG None  Radiology No results found.  Procedures Procedures (including critical care time)  Medications Ordered in ED Medications  sodium chloride 0.9 % bolus 500 mL (500 mLs Intravenous New Bag/Given 10/21/17 2137)    oxyCODONE-acetaminophen (PERCOCET/ROXICET) 5-325 MG per tablet 1 tablet (1 tablet Oral Given 10/21/17 2114)  ketorolac (TORADOL) 30 MG/ML injection 30 mg (30 mg Intravenous Given 10/21/17 2148)  cephALEXin (KEFLEX) capsule 500 mg (500 mg Oral Given 10/21/17 2147)     Initial Impression / Assessment and Plan / ED Course  I have reviewed the triage vital signs and the nursing notes.  Pertinent labs & imaging results that were available during my care of the patient were reviewed by me and considered in my medical decision making (see chart for details).    Labs: UA, I stat beta, Lipase, CMP, CBC  Imaging:  Consults:  Therapeutics: NS, Keflex  Discharge Meds: Keflex, Zofran   Assessment/Plan: 57 year old female presents today with likely urinary tract infection.  Patient has slight elevation in white count with minor tachycardia.  She is afebrile nontoxic-appearing in no acute distress.  I have low suspicion for significant systemic illness.  Patient does have past medical history of recurring urinary tract infections.  Her urine will be cultured here today, she will be given normal saline, Toradol as needed for discomfort and discharged with antinausea medication and antibiotics.  Patient will follow-up as an outpatient with her primary care, she will return to the emergency room immediately with any new or worsening signs or symptoms, or in 48 hours if symptoms are not improving.  She does have history kidney stones, low suspicion for acute kidney stone as she is not having typical pain no need for imaging studies at this time. PT care signed out to oncoming provider fluids, reassessment, and disposition.    Final Clinical Impressions(s) / ED Diagnoses   Final diagnoses:  Pyelonephritis    ED Discharge Orders        Ordered    cephALEXin (KEFLEX) 500 MG capsule  3 times daily     10/21/17 2143    ondansetron (ZOFRAN) 4 MG tablet  Every 6 hours     10/21/17 2143       Eyvonne MechanicHedges,  Tyrease Vandeberg, PA-C 10/21/17 2219    Margarita Grizzleay, Danielle, MD 10/22/17 1025

## 2017-10-21 NOTE — Patient Instructions (Signed)
Go on over to the ED to get labwork and likely a CT scan Schedule an appointment with Dr. Artist PaisYoo to follow up on your regular medical issues  Sent cream for your shins to your pharmacy  Be well, Dr. Pollie MeyerMcIntyre

## 2017-10-21 NOTE — Discharge Instructions (Signed)
Please read attached information. If you experience any new or worsening signs or symptoms please return to the emergency room for evaluation. Please follow-up with your primary care provider or specialist as discussed. Please use medication prescribed only as directed and discontinue taking if you have any concerning signs or symptoms.   °

## 2017-10-24 LAB — URINE CULTURE: Culture: >=100000 — AB

## 2017-10-25 ENCOUNTER — Telehealth: Payer: Self-pay

## 2017-10-25 NOTE — Telephone Encounter (Signed)
Post ED Visit - Positive Culture Follow-up  Culture report reviewed by antimicrobial stewardship pharmacist:  []  [name], Pharm.D. []  [name], Pharm.D., BCPS AQ-ID []  [name], Pharm.D., BCPS [x]  [name], Pharm.D., BCPS []  [name], [address] BoulevardPharm.D., BCPS, AAHIVP []  [name], Pharm.D., BCPS, AAHIVP []  [name], PharmD, BCPS []  [name], PharmD []  [name], PharmD, BCPS  Positive urine culture Treated with Cephalexin, organism sensitive to the same and no further patient follow-up is required at this time.  Jerry CarasCullom, [name] 10/25/2017, 9:26 AM

## 2017-12-24 ENCOUNTER — Emergency Department (HOSPITAL_COMMUNITY)
Admission: EM | Admit: 2017-12-24 | Discharge: 2017-12-24 | Disposition: A | Discharge status: Home / Self Care | Payer: Self-pay | Attending: Emergency Medicine | Admitting: Emergency Medicine

## 2017-12-24 ENCOUNTER — Encounter (HOSPITAL_COMMUNITY): Payer: Self-pay | Admitting: Emergency Medicine

## 2017-12-24 ENCOUNTER — Other Ambulatory Visit: Payer: Self-pay

## 2017-12-24 DIAGNOSIS — F17210 Nicotine dependence, cigarettes, uncomplicated: Secondary | ICD-10-CM | POA: Insufficient documentation

## 2017-12-24 DIAGNOSIS — Z7982 Long term (current) use of aspirin: Secondary | ICD-10-CM | POA: Insufficient documentation

## 2017-12-24 DIAGNOSIS — R300 Dysuria: Secondary | ICD-10-CM | POA: Insufficient documentation

## 2017-12-24 DIAGNOSIS — E1122 Type 2 diabetes mellitus with diabetic chronic kidney disease: Secondary | ICD-10-CM | POA: Insufficient documentation

## 2017-12-24 DIAGNOSIS — I509 Heart failure, unspecified: Secondary | ICD-10-CM | POA: Insufficient documentation

## 2017-12-24 DIAGNOSIS — N182 Chronic kidney disease, stage 2 (mild): Secondary | ICD-10-CM | POA: Insufficient documentation

## 2017-12-24 DIAGNOSIS — I130 Hypertensive heart and chronic kidney disease with heart failure and stage 1 through stage 4 chronic kidney disease, or unspecified chronic kidney disease: Secondary | ICD-10-CM | POA: Insufficient documentation

## 2017-12-24 DIAGNOSIS — Z794 Long term (current) use of insulin: Secondary | ICD-10-CM | POA: Insufficient documentation

## 2017-12-24 DIAGNOSIS — R509 Fever, unspecified: Secondary | ICD-10-CM | POA: Insufficient documentation

## 2017-12-24 DIAGNOSIS — N12 Tubulo-interstitial nephritis, not specified as acute or chronic: Secondary | ICD-10-CM | POA: Insufficient documentation

## 2017-12-24 LAB — URINALYSIS, ROUTINE W REFLEX MICROSCOPIC
BILIRUBIN URINE: NEGATIVE
Ketones, ur: NEGATIVE mg/dL
NITRITE: POSITIVE — AB
PROTEIN: 100 mg/dL — AB
RBC / HPF: >50 RBC/hpf — ABNORMAL HIGH (ref 0–5)
Specific Gravity, Urine: 1.023 (ref 1.005–1.030)
WBC, UA: >50 WBC/hpf — ABNORMAL HIGH (ref 0–5)
pH: 5 (ref 5.0–8.0)

## 2017-12-24 LAB — CBC WITH DIFFERENTIAL/PLATELET
Basophils Absolute: 0.3 10*3/uL — ABNORMAL HIGH (ref 0.0–0.1)
Basophils Relative: 1 %
EOS ABS: 0 10*3/uL (ref 0.0–0.7)
Eosinophils Relative: 0 %
HCT: 51.9 % — ABNORMAL HIGH (ref 36.0–46.0)
HEMOGLOBIN: 17.4 g/dL — AB (ref 12.0–15.0)
LYMPHS PCT: 4 %
Lymphs Abs: 1.1 10*3/uL (ref 0.7–4.0)
MCH: 30.6 pg (ref 26.0–34.0)
MCHC: 33.5 g/dL (ref 30.0–36.0)
MCV: 91.4 fL (ref 78.0–100.0)
MONO ABS: 1.4 10*3/uL — AB (ref 0.1–1.0)
Monocytes Relative: 5 %
NEUTROS PCT: 90 %
Neutro Abs: 24.4 10*3/uL — ABNORMAL HIGH (ref 1.7–7.7)
PLATELETS: 339 10*3/uL (ref 150–400)
RBC: 5.68 MIL/uL — ABNORMAL HIGH (ref 3.87–5.11)
RDW: 12.7 % (ref 11.5–15.5)
WBC: 27.2 10*3/uL — ABNORMAL HIGH (ref 4.0–10.5)

## 2017-12-24 LAB — CBG MONITORING, ED: GLUCOSE-CAPILLARY: 390 mg/dL — AB (ref 70–99)

## 2017-12-24 LAB — COMPREHENSIVE METABOLIC PANEL
ALK PHOS: 178 U/L — AB (ref 38–126)
ALT: 33 U/L (ref 0–44)
ANION GAP: 13 (ref 5–15)
AST: 34 U/L (ref 15–41)
Albumin: 3.5 g/dL (ref 3.5–5.0)
BUN: 20 mg/dL (ref 6–20)
CALCIUM: 9.6 mg/dL (ref 8.9–10.3)
CO2: 21 mmol/L — ABNORMAL LOW (ref 22–32)
CREATININE: 1.34 mg/dL — AB (ref 0.44–1.00)
Chloride: 101 mmol/L (ref 98–111)
GFR, EST AFRICAN AMERICAN: 51 mL/min — AB (ref >60)
GFR, EST NON AFRICAN AMERICAN: 44 mL/min — AB (ref >60)
Glucose, Bld: 389 mg/dL — ABNORMAL HIGH (ref 70–99)
Potassium: 5 mmol/L (ref 3.5–5.1)
SODIUM: 135 mmol/L (ref 135–145)
Total Bilirubin: 0.6 mg/dL (ref 0.3–1.2)
Total Protein: 7.3 g/dL (ref 6.5–8.1)

## 2017-12-24 MED ORDER — INSULIN ASPART 100 UNIT/ML ~~LOC~~ SOLN
6.0000 [IU] | Freq: Once | SUBCUTANEOUS | Status: AC
  Administered 2017-12-24: 6 [IU] via SUBCUTANEOUS
  Filled 2017-12-24: qty 1

## 2017-12-24 MED ORDER — SODIUM CHLORIDE 0.9 % IV BOLUS
1000.0000 mL | Freq: Once | INTRAVENOUS | Status: AC
  Administered 2017-12-24: 1000 mL via INTRAVENOUS

## 2017-12-24 MED ORDER — KETOROLAC TROMETHAMINE 30 MG/ML IJ SOLN
30.0000 mg | Freq: Once | INTRAMUSCULAR | Status: AC
  Administered 2017-12-24: 30 mg via INTRAMUSCULAR
  Filled 2017-12-24: qty 1

## 2017-12-24 MED ORDER — ACETAMINOPHEN 325 MG PO TABS
650.0000 mg | ORAL_TABLET | Freq: Once | ORAL | Status: AC
  Administered 2017-12-24: 650 mg via ORAL
  Filled 2017-12-24: qty 2

## 2017-12-24 MED ORDER — ONDANSETRON 4 MG PO TBDP
4.0000 mg | ORAL_TABLET | Freq: Once | ORAL | Status: AC
Start: 2017-12-24 — End: 2017-12-24
  Administered 2017-12-24: 4 mg via ORAL
  Filled 2017-12-24: qty 1

## 2017-12-24 MED ORDER — CEFTRIAXONE SODIUM 1 G IJ SOLR
1.0000 g | Freq: Once | INTRAMUSCULAR | Status: AC
  Administered 2017-12-24: 1 g via INTRAVENOUS
  Filled 2017-12-24: qty 10

## 2017-12-24 MED ORDER — CEPHALEXIN 500 MG PO CAPS: 500.0000 mg | ORAL_CAPSULE | Freq: Three times a day (TID) | ORAL | 0 refills | Status: DC

## 2017-12-24 NOTE — ED Provider Notes (Signed)
Patient placed in Quick Look pathway, seen and evaluated   Chief Complaint: Right sided abdominal pain, fever  HPI:   Patient reports that around 2-3pm she started having right sided abdominal pain that shoots down to her groin.  She had a fever at home of 100.7.  She reprots when she has had similar in the past it has been a UTI.  She reports dysuria, frequency, urgency.    ROS: Fevers, nausea, no vomiting  Physical Exam:   Gen: No distress  Neuro: Awake and Alert  Skin: Warm    Focused Exam: Right sided CVA TT percussion.     Initiation of care has begun. The patient has been counseled on the process, plan, and necessity for staying for the completion/evaluation, and the remainder of the medical screening examination    Norman ClayHammond, Sarahgrace Broman W, PA-C 12/24/17 1952    Terrilee FilesButler, Michael C, MD 12/25/17 1122

## 2017-12-24 NOTE — ED Triage Notes (Signed)
Pt has had right side pain and vaginal itching for the past day and today started having fever/chills. Fever controlled with tylenol and not present on triage. Pt states in the past these symptoms were experienced and were a UTI

## 2017-12-24 NOTE — ED Provider Notes (Signed)
[facility] EMERGENCY DEPARTMENT Provider Note   CSN: [id number] Arrival date & time: 12/24/17  1923     History   Chief Complaint Chief Complaint  Patient presents with  . Pelvic Pain  . Flank Pain    HPI [name] is a 57 y.o. female.  HPI   57 year old female presents today with complaints of right-sided flank pain.  Patient notes a significant past medical history of frequent urinary tract infections.  She notes this feels similar to previous.  She notes that symptoms started around 2:58 PM today with associated fever of 100.7 at home.  She notes some radiation around her flank, denies any lower abdominal pain, pelvic pain, vaginal discharge or bleeding.  She notes normal color and clarity of her urine but dysuria.  She denies any changes in bowel movements.  Patient eating.  She does report a history of kidney stones but notes this does not feel similar in nature.  She does note a history of diabetes and reports that she did take her insulin today.  Past Medical History:  Diagnosis Date  . BACK PAIN, LOW 08/29/2006  . CHF (congestive heart failure) (HCC)   . DEPRESSION, MAJOR, RECURRENT 08/29/2006  . DISORDER, BIPOLAR NOS 09/09/2006  . DMII (diabetes mellitus, type 2) (HCC) 09/19/2010  . Hyperlipidemia 09/19/2010   LDL 78 on 08/2010. Recheck 08/2011.    Marland Kitchen HYPERTENSION, BENIGN SYSTEMIC 08/29/2006   Well controlled on Lopressor and lisinopril. Check BMET 12/2011    . Kidney infection   . OVARIAN MASS 09/18/2007   Benign ovarian cyst s/p laparotomy for removal   . TOBACCO DEPENDENCE 08/29/2006   Patient quit in early months of 2012. Continue to follow for possible relapse.    . Urosepsis 09/27/2012    Patient Active Problem List   Diagnosis Date Noted  . Acute left-sided low back pain with left-sided sciatica 02/21/2017  . Diabetes mellitus due to underlying condition, uncontrolled, with hyperglycemia, with long-term current use of insulin (HCC)  10/03/2016  . Health care maintenance 03/31/2015  . Yeast infection 12/08/2014  . Recurrent kidney stones 10/03/2012  . 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 Obesity, Class III, BMI 40-49.9 (morbid obesity) (HCC) 11/08/2011  . Right knee pain 03/19/2011  . DMII (diabetes mellitus, type 2) (HCC) 09/19/2010  . Hyperlipidemia 09/19/2010  . DISORDER, BIPOLAR NOS 09/09/2006  . DEPRESSION, MAJOR, RECURRENT 08/29/2006  . TOBACCO DEPENDENCE 08/29/2006  . HYPERTENSION, BENIGN SYSTEMIC 08/29/2006  . VENOUS INSUFFICIENCY, CHRONIC 08/29/2006    Past Surgical History:  Procedure Laterality Date  . PARTIAL HYSTERECTOMY     unsure if had cervix removed.   . stents Bilateral    Kidneys     OB History   None      Home Medications    Prior to Admission medications   Medication Sig Start Date End Date Taking? Authorizing Provider  amoxicillin-clavulanate (AUGMENTIN) 875-125 MG tablet Take 1 tablet by mouth every 12 (twelve) hours. Patient not taking: Reported on 10/21/2017 08/11/17   Renne Crigler, PA-C  aspirin 81 MG EC tablet Take 81 mg by mouth daily.     [provider]  Bismuth Subsalicylate (PEPTO-BISMOL) 262 MG TABS Take 1-2 tablets by mouth every 6 (six) hours as needed (for gas or "pressure").    [provider]  calcium carbonate (TUMS - DOSED IN MG ELEMENTAL CALCIUM) 500 MG chewable tablet Chew 1-2 tablets by mouth as needed for indigestion  or heartburn.    [provider]  cephALEXin (KEFLEX) 500 MG capsule Take 1 capsule (500 mg total) by mouth 3 (three) times daily. 12/24/17   Marykathleen Russi, Tinnie GensJeffrey, PA-C  HYDROcodone-acetaminophen (NORCO/VICODIN) 5-325 MG tablet Take 1-2 tablets every 6 hours as needed for severe pain Patient not taking: Reported on 10/21/2017 08/11/17   Renne CriglerGeiple, Joshua, PA-C  hydrocortisone cream 0.5 % Apply 1 application topically 2 (two) times daily. 10/21/17   Latrelle DodrillMcIntyre, Brittany J, MD    insulin NPH Human (HUMULIN N,NOVOLIN N) 100 UNIT/ML injection Inject 0.1-0.12 mLs (10-12 Units total) into the skin 2 (two) times daily. 12 units q am, 10 units q hs Patient taking differently: Inject 10-12 Units into the skin See admin instructions. Inject 10 units into the skin before breakfast and 10 units after supper 04/27/14   Reva BoresPratt, Tanya S, MD  insulin regular (HUMULIN R) 100 units/mL injection Inject 0.05 mLs (5 Units total) into the skin 2 (two) times daily before a meal. Patient taking differently: Inject 10 Units into the skin 2 (two) times daily before a meal.  04/27/14   Reva BoresPratt, Tanya S, MD  Multiple Vitamin (MULTIVITAMIN WITH MINERALS) TABS Take 1 tablet by mouth daily.    [provider]  ondansetron (ZOFRAN) 4 MG tablet Take 1 tablet (4 mg total) by mouth every 6 (six) hours. 10/21/17   Sahvanna Mcmanigal, Tinnie GensJeffrey, PA-C  simethicone (GAS-X EXTRA STRENGTH) 125 MG chewable tablet Chew 125 mg by mouth every 6 (six) hours as needed for flatulence.    [provider]    Family History No family history on file.  Social History Social History   Tobacco Use  . Smoking status: Current Every Day Smoker    Packs/day: 1.00    Types: Cigarettes  . Smokeless tobacco: Never Used  Substance Use Topics  . Alcohol use: Yes  . Drug use: No     Allergies   Tamsulosin   Review of Systems Review of Systems  All other systems reviewed and are negative.    Physical Exam Updated Vital Signs BP (!) 152/69 (BP Location: Right Wrist)   Pulse (!) 102   Temp (!) 97.5 F (36.4 C) (Oral)   Resp 18   SpO2 97%   Physical Exam  Constitutional: She is oriented to person, place, and time. She appears well-developed and well-nourished.  HENT:  Head: Normocephalic and atraumatic.  Eyes: Pupils are equal, round, and reactive to light. Conjunctivae are normal. Right eye exhibits no discharge. Left eye exhibits no discharge. No scleral icterus.  Neck: Normal range of motion. No JVD  present. No tracheal deviation present.  Pulmonary/Chest: Effort normal. No stridor.  Abdominal:  Minor right-sided CVA tenderness, abdomen soft nontender  Neurological: She is alert and oriented to person, place, and time. Coordination normal.  Psychiatric: She has a normal mood and affect. Her behavior is normal. Judgment and thought content normal.  Nursing note and vitals reviewed.    ED Treatments / Results  Labs (all labs ordered are listed, but only abnormal results are displayed) Labs Reviewed  URINALYSIS, ROUTINE W REFLEX MICROSCOPIC - Abnormal; Notable for the following components:      Result Value   APPearance CLOUDY (*)    Glucose, UA >=500 (*)    Hgb urine dipstick MODERATE (*)    Protein, ur 100 (*)    Nitrite POSITIVE (*)    Leukocytes, UA LARGE (*)    RBC / HPF >50 (*)    WBC, UA >50 (*)  Bacteria, UA MANY (*)    All other components within normal limits  COMPREHENSIVE METABOLIC PANEL - Abnormal; Notable for the following components:   CO2 21 (*)    Glucose, Bld 389 (*)    Creatinine, Ser 1.34 (*)    Alkaline Phosphatase 178 (*)    GFR calc non Af Amer 44 (*)    GFR calc Af Amer 51 (*)    All other components within normal limits  CBC WITH DIFFERENTIAL/PLATELET - Abnormal; Notable for the following components:   WBC 27.2 (*)    RBC 5.68 (*)    Hemoglobin 17.4 (*)    HCT 51.9 (*)    Neutro Abs 24.4 (*)    Monocytes Absolute 1.4 (*)    Basophils Absolute 0.3 (*)    All other components within normal limits  CBG MONITORING, ED - Abnormal; Notable for the following components:   Glucose-Capillary 390 (*)    All other components within normal limits  URINE CULTURE    EKG None  Radiology No results found.  Procedures Procedures (including critical care time)  Medications Ordered in ED Medications  ketorolac (TORADOL) 30 MG/ML injection 30 mg (30 mg Intramuscular Given 12/24/17 2052)  ondansetron (ZOFRAN-ODT) disintegrating tablet 4 mg (4 mg  Oral Given 12/24/17 2052)  sodium chloride 0.9 % bolus 1,000 mL (0 mLs Intravenous Stopped 12/24/17 2351)  cefTRIAXone (ROCEPHIN) 1 g in sodium chloride 0.9 % 100 mL IVPB (0 g Intravenous Stopped 12/24/17 2219)  insulin aspart (novoLOG) injection 6 Units (6 Units Subcutaneous Given 12/24/17 2142)  acetaminophen (TYLENOL) tablet 650 mg (650 mg Oral Given 12/24/17 2154)     Initial Impression / Assessment and Plan / ED Course  I have reviewed the triage vital signs and the nursing notes.  Pertinent labs & imaging results that were available during my care of the patient were reviewed by me and considered in my medical decision making (see chart for details).     Labs: Urine culture, urinalysis, CMP CBC  Imaging:  Consults:  Therapeutics: Ceftriaxone, insulin regular, Tylenol saline  Discharge Meds:   Assessment/Plan: 57 year old female presents today with likely pyelonephritis.  Symptoms with acute onset today, she is afebrile here but slightly tachycardic.  She is noted to have elevated glucose at 389, no signs of DKA.  She appears very well in the exam bed in no acute distress and nontoxic.  She is tolerating p.o.  Although patient does have a history of kidney stones I have very low suspicion for obstructive pathology as patient notes this is unlike previous kidney stones and more like previous episodes of infection.  I find it reasonable to treat patient here with a dose of ceftriaxone, normal saline and insulin.  She will be discharged with instructions to monitor her glucose at home with adequate insulin use, oral antibiotics and close outpatient follow-up with her primary care.  If she develops any new or worsening signs or symptoms patient will return immediately to the emergency room.  She verbalized understanding and agreement to today's plan had no further questions or concerns at the time discharge.  Patient's daughters at bedside who agreed to today's plan as well.      Final  Clinical Impressions(s) / ED Diagnoses   Final diagnoses:  Pyelonephritis    ED Discharge Orders        Ordered    cephALEXin (KEFLEX) 500 MG capsule  3 times daily     12/24/17 2153  Eyvonne Mechanic, PA-C 12/25/17 2137    Raeford Razor, MD 12/30/17 [phone]

## 2017-12-24 NOTE — Discharge Instructions (Addendum)
Please read attached information. If you experience any new or worsening signs or symptoms please return to the emergency room for evaluation. Please use medication prescribed only as directed and discontinue taking if you have any concerning signs or symptoms.   °

## 2017-12-27 LAB — URINE CULTURE: Culture: >=100000 — AB

## 2017-12-29 ENCOUNTER — Telehealth: Payer: Self-pay

## 2017-12-29 NOTE — Telephone Encounter (Signed)
Post ED Visit - Positive Culture Follow-up  Culture report reviewed by antimicrobial stewardship pharmacist:  []  Enzo BiNathan Batchelder, Pharm.D. []  Celedonio MiyamotoJeremy Frens, Pharm.D., BCPS AQ-ID []  Garvin FilaMike Maccia, Pharm.D., BCPS []  Georgina PillionElizabeth Martin, Pharm.D., BCPS []  Hawk SpringsMinh Pham, VermontPharm.D., BCPS, AAHIVP []  Estella HuskMichelle Turner, Pharm.D., BCPS, AAHIVP []  Lysle Pearlachel Rumbarger, PharmD, BCPS []  Sherlynn CarbonAustin Lucas, PharmD []  Pollyann SamplesAndy Johnston, PharmD, BCPS [facility] Pharm D Positive urine culture Treated with Cephalexin, organism sensitive to the same and no further patient follow-up is required at this time.  Holly CarasCullom, [name] 12/29/2017, 10:00 AM

## 2018-01-06 ENCOUNTER — Other Ambulatory Visit: Payer: Self-pay

## 2018-01-06 ENCOUNTER — Ambulatory Visit (INDEPENDENT_AMBULATORY_CARE_PROVIDER_SITE_OTHER): Payer: Self-pay | Admitting: Family Medicine

## 2018-01-06 ENCOUNTER — Encounter: Payer: Self-pay | Admitting: Family Medicine

## 2018-01-06 VITALS — BP 138/60 | HR 107 | Temp 98.3°F | Ht 64.0 in | Wt 272.2 lb

## 2018-01-06 DIAGNOSIS — N10 Acute tubulo-interstitial nephritis: Secondary | ICD-10-CM

## 2018-01-06 DIAGNOSIS — R300 Dysuria: Secondary | ICD-10-CM

## 2018-01-06 LAB — POCT UA - MICROSCOPIC ONLY

## 2018-01-06 LAB — POCT URINALYSIS DIP (MANUAL ENTRY)
Bilirubin, UA: NEGATIVE
Glucose, UA: NEGATIVE mg/dL
Ketones, POC UA: NEGATIVE mg/dL
Nitrite, UA: POSITIVE — AB
Protein Ur, POC: NEGATIVE mg/dL
Spec Grav, UA: 1.015
Urobilinogen, UA: 0.2 U/dL
pH, UA: 7

## 2018-01-06 MED ORDER — LEVOFLOXACIN 500 MG PO TABS: 500.0000 mg | ORAL_TABLET | Freq: Every day | ORAL | 0 refills | Status: DC

## 2018-01-06 NOTE — Patient Instructions (Signed)
It was a pleasure to see you today! Thank you for choosing [facility] for your primary care. [name] was seen for urine infection.   Our plans for today were:  Pick up the new antibiotic today.   Go to the emergency room if at any point you feel more ill, have fever, or increasing pain.   Go to your imaging test on Wednesday and come here Thursday afternoon to ensure you are better.   Best,  Dr. Chanetta Marshallimberlake

## 2018-01-06 NOTE — Progress Notes (Deleted)
   CC: ***  HPI  UTI -   ROS: ***Denies CP, SOB, abdominal pain, dysuria, changes in BMs.   CC, SH/smoking status, and VS noted  Objective: BP 138/60   Pulse (!) 107   Temp 98.3 F (36.8 C) (Oral)   Ht 5\' 4"  (1.626 m)   Wt 272 lb 3.2 oz (123.5 kg)   SpO2 94%   BMI 46.72 kg/m  Gen: NAD, alert, cooperative, and pleasant.*** HEENT: NCAT, EOMI, PERRL CV: RRR, no murmur Resp: CTAB, no wheezes, non-labored Abd: SNTND, BS present, no guarding or organomegaly Ext: No edema, warm Neuro: Alert and oriented, Speech clear, No gross deficits  Assessment and plan:  No problem-specific Assessment & Plan notes found for this encounter.   Orders Placed This Encounter  Procedures  . POCT urinalysis dipstick  . POCT UA - Microscopic Only    No orders of the defined types were placed in this encounter.   Health Maintenance reviewed - {health maintenance:315237}.  Loni MuseKate Sylvia Kondracki, MD, PGY3 01/06/2018 4:15 PM

## 2018-01-06 NOTE — Progress Notes (Signed)
   CC: Follow-up UTI  HPI  UTI - APAP and ibuprofen not helping with pain. Both sides. No fevers at home. Took all of the keflex.  States she has had to be admitted for pyelonephritis in the past.  Of note, has a long history of kidney stones, and states that [facility] told her there is still 1 to 2 stones in her kidney that they cannot address.  ROS: Denies CP, SOB, abdominal pain, dysuria, changes in BMs.   CC, SH/smoking status, and VS noted  Objective: BP 138/60   Pulse (!) 107   Temp 98.3 F (36.8 C) (Oral)   Ht 5\' 4"  (1.626 m)   Wt 272 lb 3.2 oz (123.5 kg)   SpO2 94%   BMI 46.72 kg/m  Gen: NAD, alert, cooperative, and pleasant. HEENT: NCAT, EOMI, PERRL CV: RRR, no murmur Resp: CTAB, no wheezes, non-labored Abd: protuberant, +R CVA tenderness, +RLQ and suprapubic tenderness, BS present, no guarding or organomegaly Ext: No edema, warm Neuro: Alert and oriented, Speech clear, No gross deficits  Assessment and plan:  Acute pyelonephritis Patient presents today with persistent CVA tenderness and dysuria.  Recently seen in the emergency room and treated for E. coli UTI with Keflex.  This was appropriate from culture data.  Given persistent symptoms and UA today with what appears to be persistent infection, I am concerned for infected stone at this point.  Patient has stable vital signs and is afebrile.  Will work-up infected stone as an outpatient, with stat CT renal stone study, and if patient has any worsening symptoms at all she will go to the emergency room.  We will also change her antibiotics to Levaquin in order to cover for possible Proteus or enterococcus, which may be more likely to seed on a stone.   Orders Placed This Encounter  Procedures  . Urine Culture  . CT RENAL STONE STUDY    Standing Status:   Future    Standing Expiration Date:   04/09/2019    Order Specific Question:   ** REASON FOR EXAM (FREE TEXT)    Answer:   hx of pyelo and  recurrent stones, question infected stone    Order Specific Question:   Is patient pregnant?    Answer:   No    Order Specific Question:   Preferred imaging location?    Answer[facility]   [facility]    Order Specific Question:   Call Results- Best Contact Number?    Answer:   [phone]    Order Specific Question:   Radiology Contrast Protocol - do NOT remove file path    Answer:   \\[email or web address]  . POCT urinalysis dipstick  . POCT UA - Microscopic Only    Meds ordered this encounter  Medications  . levofloxacin (LEVAQUIN) 500 MG tablet    Sig: Take 1 tablet (500 mg total) by mouth daily.    Dispense:  10 tablet    Refill:  0     Holly MuseKate Shem Plemmons, MD, PGY3 01/08/2018 8:35 AM

## 2018-01-06 NOTE — Progress Notes (Signed)
dip 

## 2018-01-08 ENCOUNTER — Ambulatory Visit (HOSPITAL_COMMUNITY)
Admission: RE | Admit: 2018-01-08 | Discharge: 2018-01-08 | Disposition: A | Discharge status: Home / Self Care | Payer: Self-pay | Source: Ambulatory Visit | Attending: Family Medicine | Admitting: Family Medicine

## 2018-01-08 DIAGNOSIS — N289 Disorder of kidney and ureter, unspecified: Secondary | ICD-10-CM | POA: Insufficient documentation

## 2018-01-08 DIAGNOSIS — N10 Acute tubulo-interstitial nephritis: Secondary | ICD-10-CM | POA: Insufficient documentation

## 2018-01-08 DIAGNOSIS — N200 Calculus of kidney: Secondary | ICD-10-CM | POA: Insufficient documentation

## 2018-01-08 NOTE — Assessment & Plan Note (Addendum)
Patient presents today with persistent CVA tenderness and dysuria.  Recently seen in the emergency room and treated for E. coli UTI with Keflex.  This was appropriate from culture data.  Given persistent symptoms and UA today with what appears to be persistent infection, I am concerned for infected stone at this point.  Patient has stable vital signs and is afebrile.  Will work-up infected stone as an outpatient, with stat CT renal stone study, and if patient has any worsening symptoms at all she will go to the emergency room.  We will also change her antibiotics to Levaquin in order to cover for possible Proteus or enterococcus, which may be more likely to seed on a stone.

## 2018-01-09 ENCOUNTER — Encounter: Payer: Self-pay | Admitting: Family Medicine

## 2018-01-09 ENCOUNTER — Ambulatory Visit (INDEPENDENT_AMBULATORY_CARE_PROVIDER_SITE_OTHER): Payer: Self-pay | Admitting: Family Medicine

## 2018-01-09 ENCOUNTER — Other Ambulatory Visit: Payer: Self-pay

## 2018-01-09 VITALS — BP 138/74 | HR 99 | Temp 98.1°F | Ht 64.0 in | Wt 275.0 lb

## 2018-01-09 DIAGNOSIS — N12 Tubulo-interstitial nephritis, not specified as acute or chronic: Secondary | ICD-10-CM

## 2018-01-09 DIAGNOSIS — N10 Acute tubulo-interstitial nephritis: Secondary | ICD-10-CM

## 2018-01-09 MED ORDER — SULFAMETHOXAZOLE-TRIMETHOPRIM 800-160 MG PO TABS: 1.0000 | ORAL_TABLET | Freq: Two times a day (BID) | ORAL | 0 refills | Status: AC

## 2018-01-09 MED ORDER — HYDROCODONE-ACETAMINOPHEN 5-325 MG PO TABS: ORAL_TABLET | ORAL | 0 refills | Status: DC

## 2018-01-09 NOTE — Assessment & Plan Note (Addendum)
  Patient with persistent dysuria and pain. She appears well hydrated on exam with stable vital signs. HR is 99 which appears to be around her baseline from past office visits.  Culture data indicates that levaquin should be sufficient for the ESBL however culture also has gram positive cocci growing. Will add bactrim, which is also effective for the e. Coli. Patient does not want to be hospitalized. Discussed that if she fails outpatient treatment next options would be IV antibiotics. Encouraged her to call after hours line on Sunday if she is not improved to either be seen Monday or possibly be directly admission. She is to go to ED if she worsens. She is in agreement with this plan. I have given her a short course of norco for pain in this acute phase.  She also will need to follow up with urology at some point for these recurrent stones. Asked her to reach out to [facility]rolog[facility] for follow up. This does not appear urgent as her renal stone study shows small nonobstructing stones. Reviewed study with patient and her daughter.

## 2018-01-09 NOTE — Progress Notes (Signed)
    Subjective:    Patient ID: [name], female    DOB: 09-16-1960, 57 y.o.   MRN: [id number]   CC: follow up pyelonephritis  Patient has a long history of recurrent kidney stones and UTI. She has had nephrostomy tubes and stones removed in past. Her care has largely been at Urology. Had UTI symptoms in June. Went to [facility] ED and was prescribed a course of keflex. Completed course and continued to have symptoms so came to [facility] Monday. Was prescribed levaquin 10 day course. Urine sent for culture and patient had a renal stone study. Culture growing ecoli likely ESBL sensitive to levaquin as well as gram positive cocci. Renal stone study from 7/10 without acute findings, small stones bilaterally with scarring in kidneys.   Reports little improvement. She continues to have right sided back pain and burning with urination. She denies fevers or chills. No nausea or vomiting. Tolerating PO. Feels fatigued and not well overall. She is resistant to hospital admission and is tearful when discussing possible need for admission.   Smoking status reviewed- current smoker.  Review of Systems- see HPI   Objective:  BP 138/74   Pulse 99   Temp 98.1 F (36.7 C) (Oral)   Ht 5\' 4"  (1.626 m)   Wt 275 lb (124.7 kg)   SpO2 94%   BMI 47.20 kg/m  Vitals and nursing note reviewed  General: well appearing, in no acute distress HEENT: normocephalic, MMM Neck: supple, non-tender, without lymphadenopathy Cardiac: RRR, clear S1 and S2, no murmurs, rubs, or gallops Respiratory: clear to auscultation bilaterally, no increased work of breathing Abdomen: obese abdomen, soft, NTND. +CVA tenderness bilaterally Extremities: no edema or cyanosis. Warm, well perfused Skin: warm and dry, cap refill <2 seconds Neuro: alert and oriented, no focal deficits   Assessment & Plan:    Acute pyelonephritis  Patient with persistent dysuria and pain. She appears well hydrated on exam with stable vital signs. HR  is 99 which appears to be around her baseline from past office visits.  Culture data indicates that levaquin should be sufficient for the ESBL however culture also has gram positive cocci growing. Will add bactrim, which is also effective for the e. Coli. Patient does not want to be hospitalized. Discussed that if she fails outpatient treatment next options would be IV antibiotics. Encouraged her to call after hours line on Sunday if she is not improved to either be seen Monday or possibly be directly admission. She is to go to ED if she worsens. She is in agreement with this plan. I have given her a short course of norco for pain in this acute phase.  She also will need to follow up with urology at some point for these recurrent stones. Asked her to reach out to [facility]rologist at [facility] for follow up. This does not appear urgent as her renal stone study shows small nonobstructing stones. Reviewed study with patient and her daughter.    Return if symptoms worsen or fail to improve.  [name], DO Family Medicine Resident PGY-2

## 2018-01-09 NOTE — Patient Instructions (Signed)
  Please call if you are still having symptoms and need to be seen in the office Monday. There's always a resident in the hospital who can talk to you.   Please take both antibiotics.  If you have questions or concerns please do not hesitate to call at [phone].   Dolores PattyAngela Enis Leatherwood, DO PGY-2, [facility] 01/09/2018 4:44 PM

## 2018-01-10 LAB — URINE CULTURE

## 2018-10-27 ENCOUNTER — Telehealth: Payer: Self-pay | Admitting: Family Medicine

## 2018-10-27 DIAGNOSIS — N3000 Acute cystitis without hematuria: Secondary | ICD-10-CM

## 2018-10-27 MED ORDER — CEPHALEXIN 500 MG PO CAPS: 500.0000 mg | ORAL_CAPSULE | Freq: Four times a day (QID) | ORAL | 0 refills | Status: AC

## 2018-10-27 NOTE — Telephone Encounter (Signed)
[facility] Telephone Call  Patient did not consent to have virtual visit so was not billed as a telemedicine visit  Encounter participants: Patient: [name]  Provider: Leland Her  Chief Complaint: UTI symptoms  HPI: Patient states that she notcied today that she has started having UTI symptoms. It hurts when she pees and she is having pain in her side. No fever or nausea. She noticed that she felt dizzy and checked her BP. It was elevated to 180s/100s which is very unusual for her as she does not have blood pressure problems and is not on medications. She states that she had had many UTIs, kidney infections and stones in the past. She states this feels like a UTI and not like a kidney infection or stone to her because she has not had fever or difficulty urinating. She is taking po well.   ROS: per HPI  Pertinent PMHx: Recurrent kidney stones, CKD   Assessment/Plan: UTI Patient has a history of kidney stones with most recent urine cx in July 2019 showing ecoli sensitive to cefazolin. She does not have systemic symptoms. She does have flank pain but also stated that it did not feel like a pyelonephritis or nephrolithiasis episode, both of which she has had in the past. Also reassuring is that patient is taking po well and is urinating without obstructive sx. Discussed with patient that could treat with keflex QID but that should she acutely worsen she should go to ER. Offered telemedicine visit follow up in a couple of days but patient declined stating she would call back if needed. Discussed red flags of fever, obstructive uropathy, worsening abdominal pain, inability to take po. Patient voiced good understanding.  Leland Her, DO PGY-3,  Family Medicine 10/27/2018 10:06 AM

## 2019-02-13 ENCOUNTER — Emergency Department (HOSPITAL_COMMUNITY)
Admission: EM | Admit: 2019-02-13 | Discharge: 2019-02-14 | Disposition: A | Discharge status: Home / Self Care | Payer: Self-pay | Attending: Emergency Medicine | Admitting: Emergency Medicine

## 2019-02-13 ENCOUNTER — Other Ambulatory Visit: Payer: Self-pay

## 2019-02-13 ENCOUNTER — Encounter (HOSPITAL_COMMUNITY): Payer: Self-pay

## 2019-02-13 DIAGNOSIS — Z794 Long term (current) use of insulin: Secondary | ICD-10-CM | POA: Insufficient documentation

## 2019-02-13 DIAGNOSIS — Z7982 Long term (current) use of aspirin: Secondary | ICD-10-CM | POA: Insufficient documentation

## 2019-02-13 DIAGNOSIS — N10 Acute tubulo-interstitial nephritis: Secondary | ICD-10-CM | POA: Insufficient documentation

## 2019-02-13 DIAGNOSIS — N12 Tubulo-interstitial nephritis, not specified as acute or chronic: Secondary | ICD-10-CM

## 2019-02-13 DIAGNOSIS — I130 Hypertensive heart and chronic kidney disease with heart failure and stage 1 through stage 4 chronic kidney disease, or unspecified chronic kidney disease: Secondary | ICD-10-CM | POA: Insufficient documentation

## 2019-02-13 DIAGNOSIS — N182 Chronic kidney disease, stage 2 (mild): Secondary | ICD-10-CM | POA: Insufficient documentation

## 2019-02-13 DIAGNOSIS — I509 Heart failure, unspecified: Secondary | ICD-10-CM | POA: Insufficient documentation

## 2019-02-13 DIAGNOSIS — F17210 Nicotine dependence, cigarettes, uncomplicated: Secondary | ICD-10-CM | POA: Insufficient documentation

## 2019-02-13 DIAGNOSIS — R109 Unspecified abdominal pain: Secondary | ICD-10-CM

## 2019-02-13 DIAGNOSIS — E1122 Type 2 diabetes mellitus with diabetic chronic kidney disease: Secondary | ICD-10-CM | POA: Insufficient documentation

## 2019-02-13 LAB — COMPREHENSIVE METABOLIC PANEL
ALT: 24 U/L (ref 0–44)
AST: 23 U/L (ref 15–41)
Albumin: 3.2 g/dL — ABNORMAL LOW (ref 3.5–5.0)
Alkaline Phosphatase: 151 U/L — ABNORMAL HIGH (ref 38–126)
Anion gap: 8 (ref 5–15)
BUN: 14 mg/dL (ref 6–20)
CO2: 24 mmol/L (ref 22–32)
Calcium: 9.2 mg/dL (ref 8.9–10.3)
Chloride: 105 mmol/L (ref 98–111)
Creatinine, Ser: 1.04 mg/dL — ABNORMAL HIGH (ref 0.44–1.00)
GFR calc Af Amer: >60 mL/min (ref >60)
GFR calc non Af Amer: >60 mL/min (ref >60)
Glucose, Bld: 296 mg/dL — ABNORMAL HIGH (ref 70–99)
Potassium: 3.7 mmol/L (ref 3.5–5.1)
Sodium: 137 mmol/L (ref 135–145)
Total Bilirubin: 0.4 mg/dL (ref 0.3–1.2)
Total Protein: 6.6 g/dL (ref 6.5–8.1)

## 2019-02-13 LAB — CBC
HCT: 46.4 % — ABNORMAL HIGH (ref 36.0–46.0)
Hemoglobin: 16 g/dL — ABNORMAL HIGH (ref 12.0–15.0)
MCH: 32 pg (ref 26.0–34.0)
MCHC: 34.5 g/dL (ref 30.0–36.0)
MCV: 92.8 fL (ref 80.0–100.0)
Platelets: 270 10*3/uL (ref 150–400)
RBC: 5 MIL/uL (ref 3.87–5.11)
RDW: 12.1 % (ref 11.5–15.5)
WBC: 10.1 10*3/uL (ref 4.0–10.5)
nRBC: 0 % (ref 0.0–0.2)

## 2019-02-13 LAB — I-STAT BETA HCG BLOOD, ED (MC, WL, AP ONLY): I-stat hCG, quantitative: 11 m[IU]/mL — ABNORMAL HIGH (ref <5)

## 2019-02-13 LAB — LIPASE, BLOOD: Lipase: 46 U/L (ref 11–51)

## 2019-02-13 MED ORDER — SODIUM CHLORIDE 0.9% FLUSH: 3.0000 mL | Freq: Once | INTRAVENOUS | Status: DC

## 2019-02-13 NOTE — ED Triage Notes (Signed)
Pt reports right sided flank pain that radiates to her abd since last night, hx of UTIs. Nausea but no vomiting, denies diarrhea or constipation.

## 2019-02-14 LAB — URINALYSIS, ROUTINE W REFLEX MICROSCOPIC
Bilirubin Urine: NEGATIVE
Glucose, UA: >=500 mg/dL — AB
Hgb urine dipstick: NEGATIVE
Ketones, ur: NEGATIVE mg/dL
Nitrite: POSITIVE — AB
Protein, ur: NEGATIVE mg/dL
Specific Gravity, Urine: 1.018 (ref 1.005–1.030)
WBC, UA: >50 WBC/hpf — ABNORMAL HIGH (ref 0–5)
pH: 5 (ref 5.0–8.0)

## 2019-02-14 MED ORDER — KETOROLAC TROMETHAMINE 30 MG/ML IJ SOLN
30.0000 mg | Freq: Once | INTRAMUSCULAR | Status: AC
  Administered 2019-02-14: 30 mg via INTRAVENOUS
  Filled 2019-02-14: qty 1

## 2019-02-14 MED ORDER — ONDANSETRON HCL 4 MG/2ML IJ SOLN
4.0000 mg | Freq: Once | INTRAMUSCULAR | Status: AC
  Administered 2019-02-14: 4 mg via INTRAVENOUS
  Filled 2019-02-14: qty 2

## 2019-02-14 MED ORDER — SODIUM CHLORIDE 0.9 % IV BOLUS
1000.0000 mL | Freq: Once | INTRAVENOUS | Status: AC
  Administered 2019-02-14: 05:00:00 1000 mL via INTRAVENOUS

## 2019-02-14 MED ORDER — SODIUM CHLORIDE 0.9 % IV SOLN
1.0000 g | Freq: Once | INTRAVENOUS | Status: AC
  Administered 2019-02-14: 1 g via INTRAVENOUS
  Filled 2019-02-14: qty 10

## 2019-02-14 MED ORDER — HYDROCODONE-ACETAMINOPHEN 5-325 MG PO TABS: 1.0000 | ORAL_TABLET | ORAL | 0 refills | Status: DC | PRN

## 2019-02-14 MED ORDER — LEVOFLOXACIN 500 MG PO TABS: 500.0000 mg | ORAL_TABLET | Freq: Every day | ORAL | 0 refills | Status: DC

## 2019-02-14 NOTE — ED Provider Notes (Signed)
[facility] EMERGENCY DEPARTMENT Provider Note   CSN: [id number] Arrival date & time: 02/13/19  1750     History   Chief Complaint Chief Complaint  Patient presents with  . Abdominal Pain    HPI Holly MusicKimberlea G [name] is a 58 y.o. female.     The history is provided by the patient and medical records.  Abdominal Pain Associated symptoms: dysuria      58 year old female with history of chronic back pain, CHF, depression, bipolar disorder, DM 2, hyperlipidemia, history of frequent UTI/pyelonephritis, presenting to the ED with right flank pain.  This began 2 days ago and has been steadily worsening.  She called her primary care office yesterday, however they did not have any openings or appointments for E-visit.  Reports she was trying to wait until Monday, however pain increased.  She does report urinary frequency and dysuria but no hematuria.  She denies any fever or chills.  Has had some nausea but no vomiting.  Continues drinking water.  History of kidney stones but states this feels like when she has an infection. States generally she responds well to course of antibiotics as an outpatient.  She has followed up with urology about frequency of her UTIs, states they have not yet determined identifiable cause.  Past Medical History:  Diagnosis Date  . BACK PAIN, LOW 08/29/2006  . CHF (congestive heart failure) (HCC)   . DEPRESSION, MAJOR, RECURRENT 08/29/2006  . DISORDER, BIPOLAR NOS 09/09/2006  . DMII (diabetes mellitus, type 2) (HCC) 09/19/2010  . Hyperlipidemia 09/19/2010   LDL 78 on 08/2010. Recheck 08/2011.    Marland Kitchen. HYPERTENSION, BENIGN SYSTEMIC 08/29/2006   Well controlled on Lopressor and lisinopril. Check BMET 12/2011    . Kidney infection   . OVARIAN MASS 09/18/2007   Benign ovarian cyst s/p laparotomy for removal   . TOBACCO DEPENDENCE 08/29/2006   Patient quit in early months of 2012. Continue to follow for possible relapse.    . Urosepsis 09/27/2012    Patient  Active Problem List   Diagnosis Date Noted  . Acute pyelonephritis 01/08/2018  . Diabetes mellitus due to underlying condition, uncontrolled, with hyperglycemia, with long-term current use of insulin (HCC) 10/03/2016  . Health care maintenance 03/31/2015  . Recurrent kidney stones 10/03/2012  . 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 Obesity, Class III, BMI 40-49.9 (morbid obesity) (HCC) 11/08/2011  . DMII (diabetes mellitus, type 2) (HCC) 09/19/2010  . Hyperlipidemia 09/19/2010  . DISORDER, BIPOLAR NOS 09/09/2006  . DEPRESSION, MAJOR, RECURRENT 08/29/2006  . TOBACCO DEPENDENCE 08/29/2006  . HYPERTENSION, BENIGN SYSTEMIC 08/29/2006  . VENOUS INSUFFICIENCY, CHRONIC 08/29/2006    Past Surgical History:  Procedure Laterality Date  . PARTIAL HYSTERECTOMY     unsure if had cervix removed.   . stents Bilateral    Kidneys     OB History   No obstetric history on file.      Home Medications    Prior to Admission medications   Medication Sig Start Date End Date Taking? Authorizing Provider  aspirin 81 MG EC tablet Take 81 mg by mouth daily.     [provider]  Bismuth Subsalicylate (PEPTO-BISMOL) 262 MG TABS Take 1-2 tablets by mouth every 6 (six) hours as needed (for gas or "pressure").    [provider]  calcium carbonate (TUMS - DOSED IN MG ELEMENTAL CALCIUM) 500 MG chewable tablet Chew 1-2 tablets by mouth as needed for indigestion or heartburn.  [provider]  HYDROcodone-acetaminophen (NORCO/VICODIN) 5-325 MG tablet Take 1-2 tablets every 6 hours as needed for severe pain 01/09/18   Dolores Pattyiccio, Angela C, DO  hydrocortisone cream 0.5 % Apply 1 application topically 2 (two) times daily. 10/21/17   Latrelle DodrillMcIntyre, Brittany J, MD  insulin NPH Human (HUMULIN N,NOVOLIN N) 100 UNIT/ML injection Inject 0.1-0.12 mLs (10-12 Units total) into the skin 2 (two) times daily. 12 units q am, 10 units q hs Patient  taking differently: Inject 10-12 Units into the skin See admin instructions. Inject 10 units into the skin before breakfast and 10 units after supper 04/27/14   Reva BoresPratt, Tanya S, MD  insulin regular (HUMULIN R) 100 units/mL injection Inject 0.05 mLs (5 Units total) into the skin 2 (two) times daily before a meal. Patient taking differently: Inject 10 Units into the skin 2 (two) times daily before a meal.  04/27/14   Reva BoresPratt, Tanya S, MD  levofloxacin (LEVAQUIN) 500 MG tablet Take 1 tablet (500 mg total) by mouth daily. 01/06/18   Shon Haleimberlake, Kathryn S, MD  Multiple Vitamin (MULTIVITAMIN WITH MINERALS) TABS Take 1 tablet by mouth daily.    [provider]  ondansetron (ZOFRAN) 4 MG tablet Take 1 tablet (4 mg total) by mouth every 6 (six) hours. 10/21/17   Hedges, Tinnie GensJeffrey, PA-C  simethicone (GAS-X EXTRA STRENGTH) 125 MG chewable tablet Chew 125 mg by mouth every 6 (six) hours as needed for flatulence.    [provider]    Family History No family history on file.  Social History Social History   Tobacco Use  . Smoking status: Current Every Day Smoker    Packs/day: 1.00    Types: Cigarettes  . Smokeless tobacco: Never Used  Substance Use Topics  . Alcohol use: Yes  . Drug use: No     Allergies   Tamsulosin   Review of Systems Review of Systems  Gastrointestinal: Positive for abdominal pain.  Genitourinary: Positive for dysuria, flank pain and frequency.  All other systems reviewed and are negative.    Physical Exam Updated Vital Signs BP (!) 156/53 (BP Location: Right Wrist)   Pulse 79   Temp 97.9 F (36.6 C) (Oral)   Resp 18   Ht 5\' 3"  (1.6 m)   Wt 117.9 kg   SpO2 98%   BMI 46.06 kg/m   Physical Exam Vitals signs and nursing note reviewed.  Constitutional:      Appearance: She is well-developed.  HENT:     Head: Normocephalic and atraumatic.  Eyes:     Conjunctiva/sclera: Conjunctivae normal.     Pupils: Pupils are equal, round, and reactive to  light.  Neck:     Musculoskeletal: Normal range of motion.  Cardiovascular:     Rate and Rhythm: Normal rate and regular rhythm.     Heart sounds: Normal heart sounds.  Pulmonary:     Effort: Pulmonary effort is normal.     Breath sounds: Normal breath sounds.  Abdominal:     General: Bowel sounds are normal.     Palpations: Abdomen is soft.     Tenderness: There is abdominal tenderness. There is right CVA tenderness.     Comments: Right CVA tenderness, mild tenderness in suprapubic region  Musculoskeletal: Normal range of motion.  Skin:    General: Skin is warm and dry.  Neurological:     Mental Status: She is alert and oriented to person, place, and time.      ED Treatments / Results  Labs (  all labs ordered are listed, but only abnormal results are displayed) Labs Reviewed  COMPREHENSIVE METABOLIC PANEL - Abnormal; Notable for the following components:      Result Value   Glucose, Bld 296 (*)    Creatinine, Ser 1.04 (*)    Albumin 3.2 (*)    Alkaline Phosphatase 151 (*)    All other components within normal limits  CBC - Abnormal; Notable for the following components:   Hemoglobin 16.0 (*)    HCT 46.4 (*)    All other components within normal limits  URINALYSIS, ROUTINE W REFLEX MICROSCOPIC - Abnormal; Notable for the following components:   APPearance CLOUDY (*)    Glucose, UA >=500 (*)    Nitrite POSITIVE (*)    Leukocytes,Ua LARGE (*)    WBC, UA >50 (*)    Bacteria, UA MANY (*)    All other components within normal limits  I-STAT BETA HCG BLOOD, ED ([facility], [facility], [facility] ONLY) - Abnormal; Notable for the following components:   I-stat hCG, quantitative 11.0 (*)    All other components within normal limits  URINE CULTURE  LIPASE, BLOOD    EKG None  Radiology No results found.  Procedures Procedures (including critical care time)  Medications Ordered in ED Medications  sodium chloride flush (NS) 0.9 % injection 3 mL (has no administration in time range)   sodium chloride 0.9 % bolus 1,000 mL (1,000 mLs Intravenous New Bag/Given 02/14/19 0451)  ketorolac (TORADOL) 30 MG/ML injection 30 mg (30 mg Intravenous Given 02/14/19 0452)  ondansetron (ZOFRAN) injection 4 mg (4 mg Intravenous Given 02/14/19 0452)  cefTRIAXone (ROCEPHIN) 1 g in sodium chloride 0.9 % 100 mL IVPB (0 g Intravenous Stopped 02/14/19 [id number])     Initial Impression / Assessment and Plan / ED Course  I have reviewed the triage vital signs and the nursing notes.  Pertinent labs & imaging results that were available during my care of the patient were reviewed by me and considered in my medical decision making (see chart for details).  58 year old female presenting to the ED with right-sided flank pain.  This began 2 days ago.  PCP was unable to see her yesterday and pain increased so she came in.  She does report associated urinary frequency and dysuria.  She is afebrile and nontoxic.  Does have right CVA tenderness and mildly in the suprapubic region.  She is not had any pelvic pain or vaginal discharge.  Her labs are overall reassuring.  I-STAT hCG is mildly positive, however this appears consistent with her prior test and I suspect this is a false positive.  She is s/p partial hysterectomy.  UA is pending.  She was given IV fluids, Toradol, Zofran.  3:01 AM UA nitrite + with many bacteria.  Culture pending.  Given IV rocephin.  No blood to suggest stone and her symptoms sound more consistent with pyelonephritis.  CT deferred at this time.  Feeling better after medications here.  Remains afebrile and non-toxic, no vomiting, appears comfortable currently.  Last urine culture grew out E. Coli as well as E. Faecalis, sensitive to flouroquinolones.  She was treated with levaquin previously and did well with this so will start this again pending repeat culture.  She will need close follow-up with PCP. Encouraged good oral hydration. Return here for any new/acute changes-- high fever, uncontrolled  pain, vomiting, etc.  Final Clinical Impressions(s) / ED Diagnoses   Final diagnoses:  Pyelonephritis  Right flank pain    ED Discharge  Orders         Ordered    HYDROcodone-acetaminophen (NORCO/VICODIN) 5-325 MG tablet  Every 4 hours PRN     02/14/19 0625    levofloxacin (LEVAQUIN) 500 MG tablet  Daily     02/14/19 0625           Larene Pickett, PA-C 02/14/19 0630    Orpah Greek, MD 02/18/19 1058

## 2019-02-14 NOTE — Discharge Instructions (Signed)
Take the prescribed medication as directed.  Make sure to continue drinking plenty of water. Follow-up with your primary care doctor. Return to the ED for new or worsening symptoms-- high fever, vomiting, uncontrolled pain, etc.

## 2019-02-16 ENCOUNTER — Ambulatory Visit: Payer: Self-pay

## 2019-02-17 LAB — URINE CULTURE: Culture: >=100000 — AB

## 2019-02-18 ENCOUNTER — Telehealth: Payer: Self-pay | Admitting: Emergency Medicine

## 2019-02-18 NOTE — Telephone Encounter (Signed)
Post ED Visit - Positive Culture Follow-up  Culture report reviewed by antimicrobial stewardship pharmacist: [place] Team []  Elenor Quinones, Pharm.D. []  Heide Guile, Pharm.D., BCPS AQ-ID []  Parks Neptune, Pharm.D., BCPS []  Alycia Rossetti, Pharm.D., BCPS []  [place], Pharm.D., BCPS, AAHIVP []  Legrand Como, Pharm.D., BCPS, AAHIVP []  Salome Arnt, PharmD, BCPS []  Johnnette Gourd, PharmD, BCPS []  Hughes Better, PharmD, BCPS []  Leeroy Cha, PharmD []  Laqueta Linden, PharmD, BCPS []  Albertina Parr, PharmD Nicoletta Dress PharmD  [place] Team []  Leodis Sias, PharmD []  Lindell Spar, PharmD []  Royetta Asal, PharmD []  Graylin Shiver, Rph []  Rema Fendt) Glennon Mac, PharmD []  Arlyn Dunning, PharmD []  Netta Cedars, PharmD []  Dia Sitter, PharmD []  Leone Haven, PharmD []  Gretta Arab, PharmD []  Theodis Shove, PharmD []  Peggyann Juba, PharmD []  Reuel Boom, PharmD   Positive urine culture Treated with levofloxacin, organism sensitive to the same and no further patient follow-up is required at this time.  Hazle Nordmann 02/18/2019, 11:35 AM

## 2019-03-02 ENCOUNTER — Encounter: Payer: Self-pay | Admitting: Family Medicine

## 2019-03-02 ENCOUNTER — Ambulatory Visit (INDEPENDENT_AMBULATORY_CARE_PROVIDER_SITE_OTHER): Payer: Self-pay | Admitting: Family Medicine

## 2019-03-02 ENCOUNTER — Other Ambulatory Visit: Payer: Self-pay

## 2019-03-02 VITALS — BP 140/76 | HR 90 | Temp 98.2°F | Ht 64.0 in | Wt 272.0 lb

## 2019-03-02 DIAGNOSIS — N898 Other specified noninflammatory disorders of vagina: Secondary | ICD-10-CM

## 2019-03-02 DIAGNOSIS — Z794 Long term (current) use of insulin: Secondary | ICD-10-CM

## 2019-03-02 DIAGNOSIS — E1169 Type 2 diabetes mellitus with other specified complication: Secondary | ICD-10-CM

## 2019-03-02 DIAGNOSIS — N10 Acute tubulo-interstitial nephritis: Secondary | ICD-10-CM

## 2019-03-02 LAB — POCT URINALYSIS DIP (MANUAL ENTRY)
Bilirubin, UA: NEGATIVE
Glucose, UA: 250 mg/dL — AB
Ketones, POC UA: NEGATIVE mg/dL
Nitrite, UA: NEGATIVE
Protein Ur, POC: NEGATIVE mg/dL
Spec Grav, UA: 1.02 (ref 1.010–1.025)
Urobilinogen, UA: 0.2 E.U./dL
pH, UA: 7 (ref 5.0–8.0)

## 2019-03-02 LAB — POCT UA - MICROSCOPIC ONLY

## 2019-03-02 LAB — POCT GLYCOSYLATED HEMOGLOBIN (HGB A1C): HbA1c, POC (controlled diabetic range): 9.9 % — AB (ref 0.0–7.0)

## 2019-03-02 MED ORDER — NAPROXEN 500 MG PO TABS: 500.0000 mg | ORAL_TABLET | Freq: Two times a day (BID) | ORAL | 0 refills | Status: AC

## 2019-03-02 MED ORDER — ONDANSETRON HCL 4 MG PO TABS: 4.0000 mg | ORAL_TABLET | Freq: Four times a day (QID) | ORAL | 0 refills | Status: DC

## 2019-03-02 MED ORDER — FLUCONAZOLE 150 MG PO TABS: 150.0000 mg | ORAL_TABLET | Freq: Every day | ORAL | 0 refills | Status: DC

## 2019-03-02 MED ORDER — SULFAMETHOXAZOLE-TRIMETHOPRIM 800-160 MG PO TABS: 1.0000 | ORAL_TABLET | Freq: Two times a day (BID) | ORAL | 0 refills | Status: DC

## 2019-03-02 NOTE — Progress Notes (Signed)
Subjective:   Patient ID: [name]    DOB: [birth year], 58 y.o. female   MRN: [id number]  [name] is a 58 y.o. female with a history of HTN, venous insufficiency, DM2, CKD2, recurrent kidney stones, depression, bipolar d/o, obesity, HLD, tobacco use, recurrent UTI here for   ED follow up - [facility] for R flank pain and dysuria x2 days consistent with pyelonephritis. Given IV CTX with Rx for levaquin. UCx grew Citrobacter sensitive to ciprofloxacin, finished last Monday.  - symptoms the same despite finishing antibiotics. Thinks she may have had a fever, but hasn't checked temp. - previously saw Urology, last saw a couple years ago. -Thinks she gets UTIs about every 6 months.  - Also with prior history of kidney stones and thinks she may also be passing a stone.  - Endorses some thick vaginal d/c since completing antibiotics.  Review of Systems:  Per HPI.  PMFSH, medications and smoking status reviewed.  Objective:   BP 140/76   Pulse 90   Temp 98.2 F (36.8 C) (Oral)   Ht 5\' 4"  (1.626 m)   Wt 272 lb (123.4 kg)   SpO2 99%   BMI 46.69 kg/m  Vitals and nursing note reviewed.  General: Morbidly obese female, in no acute distress with non-toxic appearance CV: regular rate and rhythm without murmurs, rubs, or gallops Lungs: clear to auscultation bilaterally with normal work of breathing Abdomen: soft, non-tender, obese abdomen, no masses or organomegaly palpable, normoactive bowel sounds, R CVA tenderness Skin: warm, dry, no rashes or lesions Extremities: warm and well perfused, normal tone MSK: ROM grossly intact, strength intact, gait normal Neuro: Alert and oriented, speech normal  Assessment & Plan:   Acute pyelonephritis UA today still with moderate leuks although no nitrites, small hemoglobin.  No systemic symptoms concerning for sepsis, afebrile today with stable vitals.  Previously treated with Levaquin, however given symptoms are persistent  with evidence of remaining infection, will additionally give Bactrim.  UA also with small hemoglobin and ED UA with calcium oxalate crystals, patient may also be passing a stone.  Is allergic to Flomax.  Will give naproxen for pain relief and Zofran for nausea.  Instructed patient to follow-up with urology.  Will send urine for repeat culture today, based on results may need to obtain CT scan to evaluate for presence and size of renal/ureteral stone but lack of insurance is significant barrier.  See AVS for details on patient instructions.  Vaginal discharge After completing antibiotics, will provide Diflucan 2 tablets, 1 for now and 1 after completion of repeat antibiotics.  Follow-up for vaginal exam and wet prep if no better.  Orders Placed This Encounter  Procedures  . POCT urinalysis dipstick  . HgB A1c  . POCT UA - Microscopic Only   Meds ordered this encounter  Medications  . ondansetron (ZOFRAN) 4 MG tablet    Sig: Take 1 tablet (4 mg total) by mouth every 6 (six) hours.    Dispense:  12 tablet    Refill:  0  . sulfamethoxazole-trimethoprim (BACTRIM DS) 800-160 MG tablet    Sig: Take 1 tablet by mouth 2 (two) times daily for 7 days.    Dispense:  14 tablet    Refill:  0  . naproxen (NAPROSYN) 500 MG tablet    Sig: Take 1 tablet (500 mg total) by mouth 2 (two) times daily with a meal for 10 days.    Dispense:  20 tablet  Refill:  0  . fluconazole (DIFLUCAN) 150 MG tablet    Sig: Take 1 tablet (150 mg total) by mouth daily.    Dispense:  2 tablet    Refill:  0    Rory Percy, DO PGY-3, [facility] 03/02/2019 10:10 AM

## 2019-03-02 NOTE — Assessment & Plan Note (Signed)
After completing antibiotics, will provide Diflucan 2 tablets, 1 for now and 1 after completion of repeat antibiotics.  Follow-up for vaginal exam and wet prep if no better.

## 2019-03-02 NOTE — Addendum Note (Signed)
Addended by: [place] Pink on: 03/02/2019 01:34 PM   Modules accepted: Orders

## 2019-03-02 NOTE — Patient Instructions (Signed)
It was great to see you!  Our plans for today:  - Your urine sample still looks like you have an infection.  We will also send her urine for culture.  There is also some blood in your urine as well as some crystals which could also be indicative of another kidney stone. -We will give you an additional antibiotic to treat your infection.  We will call you with the results of your urine culture. Based on your urine culture results, we may need to do further imaging such as a CT scan or a kidney ultrasound. -Make sure you drink plenty of water and stay well-hydrated.  Drinking acidic fluids like orange juice or lemonade can help prevent formation of future kidney stones. -You should call your urologist for a follow-up appointment. -Once your infection is cleared, you should make an appointment to update your Pap smear and follow-up for your diabetes.  Take care and seek immediate care sooner if you develop any concerns.   Dr. [facility]

## 2019-03-02 NOTE — Assessment & Plan Note (Addendum)
UA today still with moderate leuks although no nitrites, small hemoglobin.  No systemic symptoms concerning for sepsis, afebrile today with stable vitals.  Previously treated with Levaquin, however given symptoms are persistent with evidence of remaining infection, will additionally give Bactrim.  UA also with small hemoglobin and ED UA with calcium oxalate crystals, patient may also be passing a stone.  Is allergic to Flomax.  Will give naproxen for pain relief and Zofran for nausea.  Instructed patient to follow-up with urology.  Will send urine for repeat culture today, based on results may need to obtain CT scan to evaluate for presence and size of renal/ureteral stone but lack of insurance is significant barrier.  See AVS for details on patient instructions.

## 2019-03-04 ENCOUNTER — Other Ambulatory Visit: Payer: Self-pay | Admitting: Family Medicine

## 2019-03-04 LAB — URINE CULTURE

## 2019-03-04 MED ORDER — NITROFURANTOIN MONOHYD MACRO 100 MG PO CAPS: 100.0000 mg | ORAL_CAPSULE | Freq: Two times a day (BID) | ORAL | 0 refills | Status: AC

## 2019-08-10 ENCOUNTER — Other Ambulatory Visit: Payer: Self-pay

## 2019-08-10 ENCOUNTER — Ambulatory Visit (INDEPENDENT_AMBULATORY_CARE_PROVIDER_SITE_OTHER): Payer: Self-pay | Admitting: Student in an Organized Health Care Education/Training Program

## 2019-08-10 VITALS — BP 148/62 | HR 106 | Ht 64.0 in | Wt 269.2 lb

## 2019-08-10 DIAGNOSIS — Z794 Long term (current) use of insulin: Secondary | ICD-10-CM

## 2019-08-10 DIAGNOSIS — E0865 Diabetes mellitus due to underlying condition with hyperglycemia: Secondary | ICD-10-CM

## 2019-08-10 DIAGNOSIS — M25559 Pain in unspecified hip: Secondary | ICD-10-CM | POA: Insufficient documentation

## 2019-08-10 LAB — POCT GLYCOSYLATED HEMOGLOBIN (HGB A1C): HbA1c, POC (controlled diabetic range): 10.1 % — AB (ref 0.0–7.0)

## 2019-08-10 MED ORDER — KETOROLAC TROMETHAMINE 10 MG PO TABS: 10.0000 mg | ORAL_TABLET | Freq: Three times a day (TID) | ORAL | 0 refills | Status: AC

## 2019-08-10 NOTE — Progress Notes (Signed)
   CHIEF COMPLAINT / HPI: hip pain  Patient has chronic hip and back pain.  Approximately 2 weeks ago patient began having an ache on her left lateral hip which was mild but has gradually worsened to the entire lateral hip radiating down towards her knee which is described as sharp almost burning pain.  Pain is worsened with movement and standing.  Improved by lying on the side.  Endorses some numbness to the area.  Denies any weakness or feeling of the leg being heavy.  No loss of bowel or bladder control, no rash, no increased warmth, erythema, fever. Denies any injuries. Patient has been trialing heating pad, ice, alternating ibuprofen and Tylenol with no improvement in pain.  PERTINENT  PMH / PSH: DJD   OBJECTIVE: BP (!) 148/62   Pulse (!) 106   Ht 5\' 4"  (1.626 m)   Wt 269 lb 4 oz (122.1 kg)   SpO2 98%   BMI 46.22 kg/m   General: NAD, pleasant, able to participate in exam Abdomen: soft, nontender, nondistended, no hepatic or splenomegaly, +BS MSK:  Lumbar spine: Negative for tenderness to palpation, full painless range of motion in flexion and extension.  Negative leg raise bilaterally. Hips: Negative tenderness to hip joint with and Fader. Positive tenderness to palpation over lateral left IT band. Positive weakness with abduction of left hip, otherwise neurovascularly intact for bilateral lower extremities with good strength and normal gait. Skin: warm and dry, no rashes noted Neuro: alert and oriented x4, no focal deficits Psych: Normal affect and mood   ASSESSMENT / PLAN:  Hip pain HPI was initially suspicious of radiculopathy and even more so because of history of DJD. However, physical exam was consistent with acute inflammation of IT band/bursitis. Discussed diagnosis with patient and management plan to start conservatively and escalate if no improvement. Prescribed 5 days of Toradol with instructions to not combine with any other NSAIDs.  Reviewed patient's past  several creatinine levels and risk stratified. Provided with home instructions for stretches and exercises for the IT band as well as referral to physical therapy. If not improved over the next week with these conservative measures, return to clinic for possible steroid injection treatment of bursitis or further work-up as indicated by physical exam at that time.     Pearlean Brownie, DO [facility]

## 2019-08-10 NOTE — Assessment & Plan Note (Signed)
HPI was initially suspicious of radiculopathy and even more so because of history of DJD. However, physical exam was consistent with acute inflammation of IT band/bursitis. Discussed diagnosis with patient and management plan to start conservatively and escalate if no improvement. Prescribed 5 days of Toradol with instructions to not combine with any other NSAIDs.  Reviewed patient's past several creatinine levels and risk stratified. Provided with home instructions for stretches and exercises for the IT band as well as referral to physical therapy. If not improved over the next week with these conservative measures, return to clinic for possible steroid injection treatment of bursitis or further work-up as indicated by physical exam at that time.

## 2019-08-10 NOTE — Patient Instructions (Signed)
It was a pleasure to see you today!  To summarize our discussion for this visit:  I believe you have IT band tendonitis/bursitis. We can treat this with a antiinflammatory medications and strengthening exercises. If these do not improve your symptoms, please come back and we can attempt other treatments such as steroid injections.   The medication I am prescribing should only be taken for the 5 days prescribed and not with any other NSAIDs (motrin, advil, ibuprofen, etc) as this can increase risk to injure your kidneys or stomach lining.  Some additional health maintenance measures we should update are: Health Maintenance Due  Topic Date Due  . Hepatitis C Screening  1960-07-11  . PAP SMEAR-Modifier  12/24/2010  . TETANUS/TDAP  05/02/2012  . MAMMOGRAM  05/28/2012  . COLONOSCOPY  05/28/2012  . OPHTHALMOLOGY EXAM  02/05/2015  . FOOT EXAM  12/08/2015  .    Please return to our clinic to see [place] as needed.  Call the clinic at [phone] if your symptoms worsen or you have any concerns.   Thank you for allowing me to take part in your care,  Dr. Doristine Mango  Iliotibial Band Syndrome Rehab Ask your health care provider which exercises are safe for you. Do exercises exactly as told by your health care provider and adjust them as directed. It is normal to feel mild stretching, pulling, tightness, or discomfort as you do these exercises. Stop right away if you feel sudden pain or your pain gets significantly worse. Do not begin these exercises until told by your health care provider. Stretching and range-of-motion exercises These exercises warm up your muscles and joints and improve the movement and flexibility of your hip and pelvis. Quadriceps stretch, prone  1. Lie on your abdomen on a firm surface, such as a bed or padded floor (prone position). 2. Bend your left / right knee and reach back to hold your ankle or pant leg. If you cannot reach your ankle or pant leg, loop a belt  around your foot and grab the belt instead. 3. Gently pull your heel toward your buttocks. Your knee should not slide out to the side. You should feel a stretch in the front of your thigh and knee (quadriceps). 4. Hold this position for __________ seconds. Repeat __________ times. Complete this exercise __________ times a day. Iliotibial band stretch An iliotibial band is a strong band of muscle tissue that runs from the outer side of your hip to the outer side of your thigh and knee. 1. Lie on your side with your left / right leg in the top position. 2. Bend both of your knees and grab your left / right ankle. Stretch out your bottom arm to help you balance. 3. Slowly bring your top knee back so your thigh goes behind your trunk. 4. Slowly lower your top leg toward the floor until you feel a gentle stretch on the outside of your left / right hip and thigh. If you do not feel a stretch and your knee will not fall farther, place the heel of your other foot on top of your knee and pull your knee down toward the floor with your foot. 5. Hold this position for __________ seconds. Repeat __________ times. Complete this exercise __________ times a day. Strengthening exercises These exercises build strength and endurance in your hip and pelvis. Endurance is the ability to use your muscles for a long time, even after they get tired. Straight leg raises, side-lying This exercise strengthens  the muscles that rotate the leg at the hip and move it away from your body (hip abductors). 1. Lie on your side with your left / right leg in the top position. Lie so your head, shoulder, hip, and knee line up. You may bend your bottom knee to help you balance. 2. Roll your hips slightly forward so your hips are stacked directly over each other and your left / right knee is facing forward. 3. Tense the muscles in your outer thigh and lift your top leg 4-6 inches (10-15 cm). 4. Hold this position for __________  seconds. 5. Slowly return to the starting position. Let your muscles relax completely before doing another repetition. Repeat __________ times. Complete this exercise __________ times a day. Leg raises, prone This exercise strengthens the muscles that move the hips (hip extensors). 1. Lie on your abdomen on your bed or a firm surface. You can put a pillow under your hips if that is more comfortable for your lower back. 2. Bend your left / right knee so your foot is straight up in the air. 3. Squeeze your buttocks muscles and lift your left / right thigh off the bed. Do not let your back arch. 4. Tense your thigh muscle as hard as you can without increasing any knee pain. 5. Hold this position for __________ seconds. 6. Slowly lower your leg to the starting position and allow it to relax completely. Repeat __________ times. Complete this exercise __________ times a day. Hip hike 1. Stand sideways on a bottom step. Stand on your left / right leg with your other foot unsupported next to the step. You can hold on to the railing or wall for balance if needed. 2. Keep your knees straight and your torso square. Then lift your left / right hip up toward the ceiling. 3. Slowly let your left / right hip lower toward the floor, past the starting position. Your foot should get closer to the floor. Do not lean or bend your knees. Repeat __________ times. Complete this exercise __________ times a day. This information is not intended to replace advice given to you by your health care provider. Make sure you discuss any questions you have with your health care provider. Document Revised: 10/09/2018 Document Reviewed: 04/09/2018 Elsevier Patient Education  2020 [employer].

## 2019-12-12 ENCOUNTER — Encounter (HOSPITAL_COMMUNITY): Payer: Self-pay | Admitting: Emergency Medicine

## 2019-12-12 ENCOUNTER — Inpatient Hospital Stay (HOSPITAL_COMMUNITY)
Admit: 2019-12-12 | Discharge: 2019-12-15 | DRG: 872 | Disposition: A | Discharge status: Home / Self Care | Payer: Self-pay | Attending: Family Medicine | Admitting: Family Medicine

## 2019-12-12 ENCOUNTER — Emergency Department (HOSPITAL_COMMUNITY): Payer: Self-pay

## 2019-12-12 ENCOUNTER — Other Ambulatory Visit: Payer: Self-pay

## 2019-12-12 DIAGNOSIS — Z20822 Contact with and (suspected) exposure to covid-19: Secondary | ICD-10-CM | POA: Diagnosis present

## 2019-12-12 DIAGNOSIS — E1165 Type 2 diabetes mellitus with hyperglycemia: Secondary | ICD-10-CM | POA: Diagnosis present

## 2019-12-12 DIAGNOSIS — N182 Chronic kidney disease, stage 2 (mild): Secondary | ICD-10-CM

## 2019-12-12 DIAGNOSIS — E118 Type 2 diabetes mellitus with unspecified complications: Secondary | ICD-10-CM

## 2019-12-12 DIAGNOSIS — I509 Heart failure, unspecified: Secondary | ICD-10-CM

## 2019-12-12 DIAGNOSIS — I10 Essential (primary) hypertension: Secondary | ICD-10-CM

## 2019-12-12 DIAGNOSIS — A4151 Sepsis due to Escherichia coli [E. coli]: Principal | ICD-10-CM | POA: Diagnosis present

## 2019-12-12 DIAGNOSIS — Z79899 Other long term (current) drug therapy: Secondary | ICD-10-CM

## 2019-12-12 DIAGNOSIS — E119 Type 2 diabetes mellitus without complications: Secondary | ICD-10-CM

## 2019-12-12 DIAGNOSIS — I129 Hypertensive chronic kidney disease with stage 1 through stage 4 chronic kidney disease, or unspecified chronic kidney disease: Secondary | ICD-10-CM | POA: Diagnosis present

## 2019-12-12 DIAGNOSIS — E1122 Type 2 diabetes mellitus with diabetic chronic kidney disease: Secondary | ICD-10-CM | POA: Diagnosis present

## 2019-12-12 DIAGNOSIS — Z9049 Acquired absence of other specified parts of digestive tract: Secondary | ICD-10-CM

## 2019-12-12 DIAGNOSIS — N179 Acute kidney failure, unspecified: Secondary | ICD-10-CM | POA: Diagnosis present

## 2019-12-12 DIAGNOSIS — R7881 Bacteremia: Secondary | ICD-10-CM

## 2019-12-12 DIAGNOSIS — N136 Pyonephrosis: Secondary | ICD-10-CM | POA: Diagnosis present

## 2019-12-12 DIAGNOSIS — Z90711 Acquired absence of uterus with remaining cervical stump: Secondary | ICD-10-CM

## 2019-12-12 DIAGNOSIS — A419 Sepsis, unspecified organism: Secondary | ICD-10-CM | POA: Diagnosis present

## 2019-12-12 DIAGNOSIS — Z794 Long term (current) use of insulin: Secondary | ICD-10-CM

## 2019-12-12 DIAGNOSIS — G8929 Other chronic pain: Secondary | ICD-10-CM | POA: Diagnosis present

## 2019-12-12 DIAGNOSIS — Z6841 Body Mass Index (BMI) 40.0 and over, adult: Secondary | ICD-10-CM

## 2019-12-12 DIAGNOSIS — E785 Hyperlipidemia, unspecified: Secondary | ICD-10-CM | POA: Diagnosis present

## 2019-12-12 DIAGNOSIS — J449 Chronic obstructive pulmonary disease, unspecified: Secondary | ICD-10-CM | POA: Diagnosis present

## 2019-12-12 DIAGNOSIS — F319 Bipolar disorder, unspecified: Secondary | ICD-10-CM

## 2019-12-12 DIAGNOSIS — Z7982 Long term (current) use of aspirin: Secondary | ICD-10-CM

## 2019-12-12 DIAGNOSIS — R6520 Severe sepsis without septic shock: Secondary | ICD-10-CM

## 2019-12-12 DIAGNOSIS — F17210 Nicotine dependence, cigarettes, uncomplicated: Secondary | ICD-10-CM | POA: Diagnosis present

## 2019-12-12 LAB — CBC WITH DIFFERENTIAL/PLATELET
Abs Immature Granulocytes: 0 10*3/uL (ref 0.00–0.07)
Basophils Absolute: 0.3 10*3/uL — ABNORMAL HIGH (ref 0.0–0.1)
Basophils Relative: 1 %
Eosinophils Absolute: 0 10*3/uL (ref 0.0–0.5)
Eosinophils Relative: 0 %
HCT: 49.8 % — ABNORMAL HIGH (ref 36.0–46.0)
Hemoglobin: 16.7 g/dL — ABNORMAL HIGH (ref 12.0–15.0)
Lymphocytes Relative: 4 %
Lymphs Abs: 1.2 10*3/uL (ref 0.7–4.0)
MCH: 31.1 pg (ref 26.0–34.0)
MCHC: 33.5 g/dL (ref 30.0–36.0)
MCV: 92.7 fL (ref 80.0–100.0)
Monocytes Absolute: 1.5 10*3/uL — ABNORMAL HIGH (ref 0.1–1.0)
Monocytes Relative: 5 %
Neutro Abs: 27.8 10*3/uL — ABNORMAL HIGH (ref 1.7–7.7)
Neutrophils Relative %: 90 %
Platelets: 300 10*3/uL (ref 150–400)
RBC: 5.37 MIL/uL — ABNORMAL HIGH (ref 3.87–5.11)
RDW: 12.3 % (ref 11.5–15.5)
WBC: 30.9 10*3/uL — ABNORMAL HIGH (ref 4.0–10.5)
nRBC: 0 % (ref 0.0–0.2)
nRBC: 0 /100 WBC

## 2019-12-12 LAB — BASIC METABOLIC PANEL
Anion gap: 14 (ref 5–15)
BUN: 22 mg/dL — ABNORMAL HIGH (ref 6–20)
CO2: 20 mmol/L — ABNORMAL LOW (ref 22–32)
Calcium: 9.2 mg/dL (ref 8.9–10.3)
Chloride: 102 mmol/L (ref 98–111)
Creatinine, Ser: 1.58 mg/dL — ABNORMAL HIGH (ref 0.44–1.00)
GFR calc Af Amer: 42 mL/min — ABNORMAL LOW (ref >60)
GFR calc non Af Amer: 36 mL/min — ABNORMAL LOW (ref >60)
Glucose, Bld: 450 mg/dL — ABNORMAL HIGH (ref 70–99)
Potassium: 4 mmol/L (ref 3.5–5.1)
Sodium: 136 mmol/L (ref 135–145)

## 2019-12-12 LAB — HIV ANTIBODY (ROUTINE TESTING W REFLEX): HIV Screen 4th Generation wRfx: NONREACTIVE

## 2019-12-12 LAB — CBC
HCT: 44.5 % (ref 36.0–46.0)
Hemoglobin: 15.1 g/dL — ABNORMAL HIGH (ref 12.0–15.0)
MCH: 31 pg (ref 26.0–34.0)
MCHC: 33.9 g/dL (ref 30.0–36.0)
MCV: 91.4 fL (ref 80.0–100.0)
Platelets: 249 10*3/uL (ref 150–400)
RBC: 4.87 MIL/uL (ref 3.87–5.11)
RDW: 12.6 % (ref 11.5–15.5)
WBC: 33.9 10*3/uL — ABNORMAL HIGH (ref 4.0–10.5)
nRBC: 0 % (ref 0.0–0.2)

## 2019-12-12 LAB — LIPID PANEL
Cholesterol: 140 mg/dL (ref 0–200)
HDL: 29 mg/dL — ABNORMAL LOW (ref >40)
LDL Cholesterol: 82 mg/dL (ref 0–99)
Total CHOL/HDL Ratio: 4.8 RATIO
Triglycerides: 144 mg/dL (ref <150)
VLDL: 29 mg/dL (ref 0–40)

## 2019-12-12 LAB — URINALYSIS, ROUTINE W REFLEX MICROSCOPIC
Bilirubin Urine: NEGATIVE
Glucose, UA: >=500 mg/dL — AB
Ketones, ur: NEGATIVE mg/dL
Nitrite: NEGATIVE
Protein, ur: 100 mg/dL — AB
RBC / HPF: >50 RBC/hpf — ABNORMAL HIGH (ref 0–5)
Specific Gravity, Urine: 1.029 (ref 1.005–1.030)
WBC, UA: >50 WBC/hpf — ABNORMAL HIGH (ref 0–5)
pH: 5 (ref 5.0–8.0)

## 2019-12-12 LAB — CREATININE, SERUM
Creatinine, Ser: 1.69 mg/dL — ABNORMAL HIGH (ref 0.44–1.00)
GFR calc Af Amer: 38 mL/min — ABNORMAL LOW (ref >60)
GFR calc non Af Amer: 33 mL/min — ABNORMAL LOW (ref >60)

## 2019-12-12 LAB — GLUCOSE, CAPILLARY
Glucose-Capillary: 324 mg/dL — ABNORMAL HIGH (ref 70–99)
Glucose-Capillary: 285 mg/dL — ABNORMAL HIGH (ref 70–99)
Glucose-Capillary: 221 mg/dL — ABNORMAL HIGH (ref 70–99)

## 2019-12-12 LAB — I-STAT BETA HCG BLOOD, ED (MC, WL, AP ONLY): I-stat hCG, quantitative: 5.9 m[IU]/mL — ABNORMAL HIGH (ref <5)

## 2019-12-12 LAB — LACTIC ACID, PLASMA
Lactic Acid, Venous: 3.1 mmol/L (ref 0.5–1.9)
Lactic Acid, Venous: 2.9 mmol/L (ref 0.5–1.9)

## 2019-12-12 LAB — PROTIME-INR
INR: 1.1 (ref 0.8–1.2)
Prothrombin Time: 13.3 seconds (ref 11.4–15.2)

## 2019-12-12 LAB — SARS CORONAVIRUS 2 BY RT PCR (HOSPITAL ORDER, PERFORMED IN ~~LOC~~ HOSPITAL LAB): SARS Coronavirus 2: NEGATIVE

## 2019-12-12 LAB — CBG MONITORING, ED: Glucose-Capillary: 312 mg/dL — ABNORMAL HIGH (ref 70–99)

## 2019-12-12 LAB — APTT: aPTT: 29 seconds (ref 24–36)

## 2019-12-12 MED ORDER — ONDANSETRON HCL 4 MG/2ML IJ SOLN
4.0000 mg | Freq: Once | INTRAMUSCULAR | Status: AC
  Administered 2019-12-12: 4 mg via INTRAVENOUS
  Filled 2019-12-12: qty 2

## 2019-12-12 MED ORDER — SODIUM CHLORIDE 0.9 % IV SOLN
1.0000 g | Freq: Once | INTRAVENOUS | Status: AC
  Administered 2019-12-12: 1 g via INTRAVENOUS
  Filled 2019-12-12: qty 10

## 2019-12-12 MED ORDER — ACETAMINOPHEN 325 MG PO TABS
650.0000 mg | ORAL_TABLET | Freq: Four times a day (QID) | ORAL | Status: DC | PRN
  Administered 2019-12-13 (×2): 650 mg via ORAL
  Filled 2019-12-12 (×2): qty 2

## 2019-12-12 MED ORDER — SODIUM CHLORIDE 0.9 % IV BOLUS
1000.0000 mL | Freq: Once | INTRAVENOUS | Status: AC
  Administered 2019-12-12: 1000 mL via INTRAVENOUS

## 2019-12-12 MED ORDER — INSULIN ASPART 100 UNIT/ML ~~LOC~~ SOLN: 10.0000 [IU] | Freq: Once | SUBCUTANEOUS | Status: DC

## 2019-12-12 MED ORDER — SODIUM CHLORIDE 0.9 % IV SOLN
1.0000 g | Freq: Once | INTRAVENOUS | Status: DC
  Filled 2019-12-12: qty 10

## 2019-12-12 MED ORDER — ENOXAPARIN SODIUM 40 MG/0.4ML ~~LOC~~ SOLN
40.0000 mg | SUBCUTANEOUS | Status: DC
  Administered 2019-12-12 – 2019-12-13 (×2): 40 mg via SUBCUTANEOUS
  Filled 2019-12-12 (×2): qty 0.4

## 2019-12-12 MED ORDER — INSULIN NPH (HUMAN) (ISOPHANE) 100 UNIT/ML ~~LOC~~ SUSP
5.0000 [IU] | Freq: Two times a day (BID) | SUBCUTANEOUS | Status: DC
  Administered 2019-12-13: 5 [IU] via SUBCUTANEOUS
  Filled 2019-12-12: qty 10

## 2019-12-12 MED ORDER — IOHEXOL 300 MG/ML  SOLN
75.0000 mL | Freq: Once | INTRAMUSCULAR | Status: AC | PRN
  Administered 2019-12-12: 75 mL via INTRAVENOUS

## 2019-12-12 MED ORDER — MORPHINE SULFATE (PF) 2 MG/ML IV SOLN
2.0000 mg | INTRAVENOUS | Status: DC | PRN
  Administered 2019-12-12 – 2019-12-13 (×5): 2 mg via INTRAVENOUS
  Filled 2019-12-12 (×5): qty 1

## 2019-12-12 MED ORDER — INSULIN GLARGINE 100 UNIT/ML ~~LOC~~ SOLN
5.0000 [IU] | Freq: Every day | SUBCUTANEOUS | Status: DC
  Administered 2019-12-12: 5 [IU] via SUBCUTANEOUS
  Filled 2019-12-12: qty 0.05

## 2019-12-12 MED ORDER — MORPHINE SULFATE (PF) 4 MG/ML IV SOLN
4.0000 mg | Freq: Once | INTRAVENOUS | Status: AC
  Administered 2019-12-12: 4 mg via INTRAVENOUS
  Filled 2019-12-12: qty 1

## 2019-12-12 MED ORDER — INSULIN ASPART 100 UNIT/ML ~~LOC~~ SOLN
0.0000 [IU] | Freq: Three times a day (TID) | SUBCUTANEOUS | Status: DC
  Administered 2019-12-12 – 2019-12-13 (×2): 8 [IU] via SUBCUTANEOUS
  Administered 2019-12-13 (×2): 5 [IU] via SUBCUTANEOUS
  Administered 2019-12-14: 8 [IU] via SUBCUTANEOUS
  Administered 2019-12-14: 2 [IU] via SUBCUTANEOUS
  Administered 2019-12-15: 3 [IU] via SUBCUTANEOUS

## 2019-12-12 MED ORDER — HYDROMORPHONE HCL 1 MG/ML IJ SOLN
1.0000 mg | Freq: Once | INTRAMUSCULAR | Status: AC
  Administered 2019-12-12: 1 mg via INTRAVENOUS
  Filled 2019-12-12: qty 1

## 2019-12-12 MED ORDER — ONDANSETRON HCL 4 MG PO TABS
4.0000 mg | ORAL_TABLET | Freq: Three times a day (TID) | ORAL | Status: DC | PRN
  Administered 2019-12-12: 4 mg via ORAL
  Filled 2019-12-12: qty 1

## 2019-12-12 NOTE — ED Provider Notes (Signed)
[facility] EMERGENCY DEPARTMENT Provider Note   CSN: [id number] Arrival date & time: 12/12/19  0431     History Chief Complaint  Patient presents with  . Back Pain    [name] is a 59 y.o. female with PMH of IDDM, CHF, HLD, HTN, and urosepsis who presents to the ED with complaints of right-sided low back discomfort and dysuria that began last night.  Patient reports that her symptoms of discomfort are bilateral, however worse on the right side.  She reports that abdomen pain.  She reports intermittent fevers all night, nausea symptoms, and diminished appetite.  She is accompanied by her daughter who was at bedside.  She states that she has not seen a urologist in over 7 years.  She states that her right-sided low back pain also radiates into her right lower quadrant.  She denies any headache or dizziness, chest pain or shortness of breath, cough, change in bowel habits, frank hematuria, or other symptoms.  She states that she has chronic pain and is unsure whether or not her flank discomfort is comparable to her history of kidney stones.  She has been taking her insulin, as directed.  HPI     Past Medical History:  Diagnosis Date  . BACK PAIN, LOW 08/29/2006  . CHF (congestive heart failure) (HCC)   . DEPRESSION, MAJOR, RECURRENT 08/29/2006  . DISORDER, BIPOLAR NOS 09/09/2006  . DMII (diabetes mellitus, type 2) (HCC) 09/19/2010  . Hyperlipidemia 09/19/2010   LDL 78 on 08/2010. Recheck 08/2011.    Marland Kitchen HYPERTENSION, BENIGN SYSTEMIC 08/29/2006   Well controlled on Lopressor and lisinopril. Check BMET 12/2011    . Kidney infection   . OVARIAN MASS 09/18/2007   Benign ovarian cyst s/p laparotomy for removal   . TOBACCO DEPENDENCE 08/29/2006   Patient quit in early months of 2012. Continue to follow for possible relapse.    . Urosepsis 09/27/2012    Patient Active Problem List   Diagnosis Date Noted  . Sepsis (HCC) 12/12/2019  . Hip pain 08/10/2019  . Acute  pyelonephritis 01/08/2018  . Diabetes mellitus due to underlying condition, uncontrolled, with hyperglycemia, with long-term current use of insulin (HCC) 10/03/2016  . Recurrent kidney stones 10/03/2012  . 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 Obesity, Class III, BMI 40-49.9 (morbid obesity) (HCC) 11/08/2011  . DMII (diabetes mellitus, type 2) (HCC) 09/19/2010  . Hyperlipidemia 09/19/2010  . DISORDER, BIPOLAR NOS 09/09/2006  . DEPRESSION, MAJOR, RECURRENT 08/29/2006  . TOBACCO DEPENDENCE 08/29/2006  . HYPERTENSION, BENIGN SYSTEMIC 08/29/2006  . VENOUS INSUFFICIENCY, CHRONIC 08/29/2006    Past Surgical History:  Procedure Laterality Date  . PARTIAL HYSTERECTOMY     unsure if had cervix removed.   . stents Bilateral    Kidneys     OB History   No obstetric history on file.     No family history on file.  Social History   Tobacco Use  . Smoking status: Current Every Day Smoker    Packs/day: 1.00    Types: Cigarettes  . Smokeless tobacco: Never Used  Substance Use Topics  . Alcohol use: Yes  . Drug use: No    Home Medications Prior to Admission medications   Medication Sig Start Date End Date Taking? Authorizing Provider  acetaminophen (TYLENOL) 500 MG tablet Take 1,000 mg by mouth every 6 (six) hours as needed for mild pain.   Yes [provider]  aspirin 81 MG EC  tablet Take 81 mg by mouth daily.    Yes [provider]  Bismuth Subsalicylate (PEPTO-BISMOL) 262 MG TABS Take 1-2 tablets by mouth every 6 (six) hours as needed (for gas or "pressure").   Yes [provider]  calcium carbonate (TUMS - DOSED IN MG ELEMENTAL CALCIUM) 500 MG chewable tablet Chew 1-2 tablets by mouth as needed for indigestion or heartburn.   Yes [provider]  hydrocortisone cream 0.5 % Apply 1 application topically 2 (two) times daily. Patient taking differently: Apply 1 application topically 2 (two)  times daily as needed for itching (rash).  10/21/17  Yes Latrelle DodrillMcIntyre, Brittany J, MD  insulin NPH Human (HUMULIN N,NOVOLIN N) 100 UNIT/ML injection Inject 0.1-0.12 mLs (10-12 Units total) into the skin 2 (two) times daily. 12 units q am, 10 units q hs Patient taking differently: Inject 10-12 Units into the skin See admin instructions. Inject 10 units into the skin before breakfast and 10 units after supper 04/27/14  Yes Reva BoresPratt, Tanya S, MD  insulin regular (HUMULIN R) 100 units/mL injection Inject 0.05 mLs (5 Units total) into the skin 2 (two) times daily before a meal. Patient taking differently: Inject 10 Units into the skin 2 (two) times daily before a meal.  04/27/14  Yes Reva BoresPratt, Tanya S, MD  Multiple Vitamin (MULTIVITAMIN WITH MINERALS) TABS Take 1 tablet by mouth daily.   Yes [provider]  simethicone (GAS-X EXTRA STRENGTH) 125 MG chewable tablet Chew 125 mg by mouth every 6 (six) hours as needed for flatulence.   Yes [provider]  fluconazole (DIFLUCAN) 150 MG tablet Take 1 tablet (150 mg total) by mouth daily. Patient not taking: Reported on 12/12/2019 03/02/19   Ellwood Denseumball, Alison, DO  HYDROcodone-acetaminophen (NORCO/VICODIN) 5-325 MG tablet Take 1 tablet by mouth every 4 (four) hours as needed. Patient not taking: Reported on 12/12/2019 02/14/19   Garlon HatchetSanders, Lisa M, PA-C  levofloxacin (LEVAQUIN) 500 MG tablet Take 1 tablet (500 mg total) by mouth daily. Patient not taking: Reported on 12/12/2019 02/14/19   Garlon HatchetSanders, Lisa M, PA-C  ondansetron (ZOFRAN) 4 MG tablet Take 1 tablet (4 mg total) by mouth every 6 (six) hours. Patient not taking: Reported on 12/12/2019 03/02/19   Ellwood Denseumball, Alison, DO    Allergies    Tamsulosin  Review of Systems   Review of Systems  All other systems reviewed and are negative.   Physical Exam Updated Vital Signs BP 126/65   Pulse 100   Temp 98.4 F (36.9 C)   Resp 16   Ht 5\' 4"  (1.626 m)   Wt 130 kg   SpO2 95%   BMI 49.19 kg/m   Physical  Exam Vitals and nursing note reviewed. Exam conducted with a chaperone present.  Constitutional:      Appearance: She is ill-appearing.  HENT:     Head: Normocephalic and atraumatic.  Eyes:     General: No scleral icterus.    Conjunctiva/sclera: Conjunctivae normal.  Cardiovascular:     Rate and Rhythm: Regular rhythm. Tachycardia present.     Pulses: Normal pulses.     Heart sounds: Normal heart sounds.  Pulmonary:     Effort: Pulmonary effort is normal. No respiratory distress.     Breath sounds: Normal breath sounds. No wheezing or rales.  Abdominal:     Comments: Soft, nondistended.  TTP in RLQ.  There is also significant right-sided CVAT.  No left-sided CVAT.  No tenderness elsewhere in the abdomen.  No overlying skin changes.  Musculoskeletal:     Cervical back: Normal range of motion. No rigidity.  Skin:    General: Skin is dry.     Capillary Refill: Capillary refill takes less than 2 seconds.  Neurological:     Mental Status: She is alert.     GCS: GCS eye subscore is 4. GCS verbal subscore is 5. GCS motor subscore is 6.  Psychiatric:        Mood and Affect: Mood normal.        Behavior: Behavior normal.        Thought Content: Thought content normal.     ED Results / Procedures / Treatments   Labs (all labs ordered are listed, but only abnormal results are displayed) Labs Reviewed  URINALYSIS, ROUTINE W REFLEX MICROSCOPIC - Abnormal; Notable for the following components:      Result Value   APPearance CLOUDY (*)    Glucose, UA >=500 (*)    Hgb urine dipstick LARGE (*)    Protein, ur 100 (*)    Leukocytes,Ua LARGE (*)    RBC / HPF >50 (*)    WBC, UA >50 (*)    Bacteria, UA RARE (*)    All other components within normal limits  CBC WITH DIFFERENTIAL/PLATELET - Abnormal; Notable for the following components:   WBC 30.9 (*)    RBC 5.37 (*)    Hemoglobin 16.7 (*)    HCT 49.8 (*)    Neutro Abs 27.8 (*)    Monocytes Absolute 1.5 (*)    Basophils Absolute 0.3  (*)    All other components within normal limits  BASIC METABOLIC PANEL - Abnormal; Notable for the following components:   CO2 20 (*)    Glucose, Bld 450 (*)    BUN 22 (*)    Creatinine, Ser 1.58 (*)    GFR calc non Af Amer 36 (*)    GFR calc Af Amer 42 (*)    All other components within normal limits  LACTIC ACID, PLASMA - Abnormal; Notable for the following components:   Lactic Acid, Venous 3.1 (*)    All other components within normal limits  LACTIC ACID, PLASMA - Abnormal; Notable for the following components:   Lactic Acid, Venous 2.9 (*)    All other components within normal limits  I-STAT BETA HCG BLOOD, ED ([facility], [facility], [facility] ONLY) - Abnormal; Notable for the following components:   I-stat hCG, quantitative 5.9 (*)    All other components within normal limits  CBG MONITORING, ED - Abnormal; Notable for the following components:   Glucose-Capillary 312 (*)    All other components within normal limits  URINE CULTURE  CULTURE, BLOOD (ROUTINE X 2)  CULTURE, BLOOD (ROUTINE X 2)  SARS CORONAVIRUS 2 BY RT PCR (HOSPITAL ORDER, PERFORMED IN [facility])  APTT  PROTIME-INR    EKG None  Radiology CT ABDOMEN PELVIS W CONTRAST  Result Date: 12/12/2019 CLINICAL DATA:  Right low back pain with dysuria since last night. Possible pyelonephritis. EXAM: CT ABDOMEN AND PELVIS WITH CONTRAST TECHNIQUE: Multidetector CT imaging of the abdomen and pelvis was performed using the standard protocol following bolus administration of intravenous contrast. CONTRAST:  42mL OMNIPAQUE IOHEXOL 300 MG/ML  SOLN COMPARISON:  01/08/2018 FINDINGS: Lower chest: Lung bases are clear. Minimal calcified plaque over the descending thoracic aorta. Hepatobiliary: Mild diffuse low-attenuation of the liver compatible with a degree of steatosis without focal mass. Previous cholecystectomy. Biliary tree is unremarkable. Pancreas: Normal. Spleen: Normal. Adrenals/Urinary Tract:  Adrenal glands are normal. Kidneys are  normal size there are patchy areas of left renal cortical scarring. Several nonobstructing left renal stones. There is delayed excretion by the right kidney. There is mild right-sided hydronephrosis with stranding/fluid in the right perinephric fat. Several right renal stones are present. 1.5 cm oval right midpole cortical hypodensity which may represent distended calyx, cyst or much less likely pyelonephritis. Ureters are normal in caliber without evidence of stones. Bladder is unremarkable. Stomach/Bowel: Stomach and small bowel are normal. Appendix is normal. Minimal diverticulosis of the colon. Vascular/Lymphatic: Mild-to-moderate calcified plaque over the abdominal aorta which is normal in caliber. Few small periaortic lymph nodes are present without significant change. Reproductive: Uterus just right of midline. Right ovary unremarkable. Left ovary not well visualized. Other: Surgical clip over the left pelvis. Musculoskeletal: Mild degenerative change of the spine. IMPRESSION: 1. Bilateral nephrolithiasis. Mild right-sided hydronephrosis with perinephric inflammation/fluid. No obstructing ureteral stones visualized. Findings may be due to recent passage of a stone and less likely pyelonephritis/infection. 2.  Mild colonic diverticulosis. 3.  Mild hepatic steatosis without focal mass. 4.  Aortic Atherosclerosis (ICD10-I70.0). Electronically Signed   By: Marin Olp M.D.   On: 12/12/2019 10:27    Procedures .Critical Care Performed by: Corena Herter, PA-C Authorized by: Corena Herter, PA-C   Critical care provider statement:    Critical care time (minutes):  45   Critical care was necessary to treat or prevent imminent or life-threatening deterioration of the following conditions:  Sepsis   Critical care was time spent personally by me on the following activities:  Discussions with consultants, evaluation of patient's response to treatment, examination of patient, ordering and performing  treatments and interventions, ordering and review of laboratory studies, ordering and review of radiographic studies, pulse oximetry, re-evaluation of patient's condition, obtaining history from patient or surrogate and review of old charts   (including critical care time)  Medications Ordered in ED Medications  sodium chloride 0.9 % bolus 1,000 mL (0 mLs Intravenous Stopped 12/12/19 1138)  morphine 4 MG/ML injection 4 mg (4 mg Intravenous Given 12/12/19 0744)  ondansetron (ZOFRAN) injection 4 mg (4 mg Intravenous Given 12/12/19 0745)  cefTRIAXone (ROCEPHIN) 1 g in sodium chloride 0.9 % 100 mL IVPB (0 g Intravenous Stopped 12/12/19 0915)  sodium chloride 0.9 % bolus 1,000 mL (0 mLs Intravenous Stopped 12/12/19 0916)  iohexol (OMNIPAQUE) 300 MG/ML solution 75 mL (75 mLs Intravenous Contrast Given 12/12/19 0952)  HYDROmorphone (DILAUDID) injection 1 mg (1 mg Intravenous Given 12/12/19 1031)    ED Course  I have reviewed the triage vital signs and the nursing notes.  Pertinent labs & imaging results that were available during my care of the patient were reviewed by me and considered in my medical decision making (see chart for details).  Clinical Course as of Dec 11 1200  Sat Dec 12, 2019  1202 Spoke with Family Medicine who will see and admit patient.     [GG]    Clinical Course User Index [GG] Corena Herter, PA-C   MDM Rules/Calculators/A&P                          Patient history, physical exam, and laboratory work-up is consistent with pyelonephritis.  However, given her history of renal stones in conjunction with right-sided flank pain rating into RLQ, will obtain CT abdomen and pelvis with contrast.  Obstructing ureteral stone should still be visualized on CT with contrast it  is important to assess for abscess in the context of suspected infection.  i-STAT beta-hCG is falsely elevated at 5.9.  Patient reports that she has not had sexual intercourse in over 15 years.  CBC notable for  leukocytosis to 3.9.  Patient demonstrates hyperglycemia at 450 that improved 312 with IV resuscitation.  BMP also demonstrates a mild AKI with creatinine elevated 1.58 and GFR reduced to 36 down from normal limits in last labs obtained.  We will resuscitate with 2 L IV NS prior to obtaining CT. Will start on Rocephin IV 1 g here in the ED.   Zofran for nausea symptoms and will also administer 10 units regular insulin.  Given patient 9 out of 10 pain and concern for sepsis, discussed case with Dr. Elesa Massed and plan is for admission.  Lactic acid elevated at 3.1.  Patient had already received 1 L IV NS and is in the process of receiving a second liter.  Code sepsis initiated given her significant leukocytosis, tachycardia, and obvious source of infection.  Clinically she is concerning for pyelonephritis and given her hyperglycemia and other comorbidities, will have patient admitted to the hospital for ongoing evaluation and management.    CT is obtained and personally reviewed which demonstrates bilateral nephrolithiasis and mild right-sided hydronephrosis with perinephric inflammation.  There are no obstructing ureteral stones visualized and the findings are thought to be suggestive of a recently passed stone.  However, patient continues to be in significant 9 out of 10 flank pain.  Given these findings, will consult hospitalist, but may also need to consult urology if her pain symptoms fail to improve and there is concern for possible obstructing kidney stone in patient with UTI.  COVID-19 testing obtained.  Spoke with Family Medicine who will see and admit patient.    Final Clinical Impression(s) / ED Diagnoses Final diagnoses:  None    Rx / DC Orders ED Discharge Orders    None       Lorelee New, PA-C 12/12/19 1203    Ward, Layla Maw, DO 12/22/19 2329

## 2019-12-12 NOTE — H&P (Addendum)
[facility] Admission History and Physical Service Pager: [phone]  Patient name: [name] Medical record number: [id number] Date of birth: 1961/01/24 Age: 59 y.o. Gender: female  Primary Care Provider: Gladys Damme, MD Consultants: None Code Status: Full Preferred Emergency Contact: Sharyn Lull Daughter: [phone]  Chief Complaint: Right-sided low back pain with dysuria  Assessment and Plan: JAKIYA BOOKBINDER is a 59 y.o. female presenting with right-sided low back pain with dysuria. PMH is significant for recurrent kidney stones, T2DM, HTN, CHF, HLD, history of urosepsis.  Sepsis 2/2 pyelonephritis Patient reported cloudy urine for the last month.  She reports last night she had worsening right lower back pain which radiated around to her right flank.  She reports subjective fever with chills last night but she did not check her temperature.  Denies any blood in her urine.  Reports the last kidney stone she passed was approximately a month ago.  Patient presented to the emergency department and was code sepsis.  Patient was tachycardic and tachypneic on arrival.  WBC was 30.9.  Patient was also hyperglycemic at 450.  She had an AKI with elevated creatinine of 1.58.  Lactic acid was elevated at 3.1.  She was given 2 L of normal saline.  Patient met SIRS criteria given she was tachycardic, tachypneic and had elevated white count.  CT abdomen was obtained showing bilateral nephrolithiasis with mild right-sided hydronephrosis and perinephric inflammation.  No obstructing ureteral stones visualized and the findings were suggestive of recently passed stone.  Urine culture and blood cultures were collected.  Given all the clinical findings as patient meets criteria for urosepsis.  Patient does have significant history of pyelonephritis with most recent being an August 2020.  At that time she grew  multidrug-resistant Citrobacter.  This episode of urosepsis  is most likely related to patient's recurrent nephrolithiasis.  Patient has not followed up with urology since 2015.  She will likely quickly and prove with fluids and antibiotics.  If she fails to show significant improvement in the next day or 2, consider renal ultrasound for further assessment of her 1.5 cm right renal hypodensity noted on CT.  This appears to be more likely a cyst but may represent an abscess. -Admit to inpatient teaching service with Dr. Erin Hearing is attending -Consider urology consult if worsening and there is evidence of obstruction.  Certainly follow-up with urology in the outpatient setting. -Continue ceftriaxone pending urine culture results and speciation -Consider switching to Bactrim or Cipro given her last UTI grew Citrobacter that was intermediate to ceftriaxone and nitrofurantoin -Morphine every 4 hours as needed -Tylenol 650 every 8 hours -Zofran 4 mg every 8 hour as needed -Follow-up on urine cultures -Follow-up on blood cultures -Morning CBC -Morning BMP -Every 4 hour vital signs -Up with assistance -PT/OT eval and treat   DMT2 Home medication includes insulin 10 units long-acting twice daily and 12 units short acting twice daily with meals.  Most recent hemoglobin A1c was in August 2020 and was 9.9. -Monitor CBGs -Hemoglobin A1c -5 units NPH twice daily -Moderate sensitivity sliding scale -Consider addition of GLP-1 or SGL 2  CHF Patient reports diagnosis of congestive heart failure in the past but she is not on any medications.  Per our imaging she does not have any recent echocardiogram or cardiac imaging.  There is a nuclear medicine study from 2002 showing EF of 60%. -Consider repeat echocardiogram if she shows evidence of fluid overload. -S/p 2 L of IVF in the ED.  Hold off on additional IVF and permit p.o. intake. -Monitor fluid status  COPD Patient reports significant smoking history and she smokes 1 pack a day at this time.  Reports history  of diagnosis with COPD but that she is not on any controller medications for this.  No recent COPD exacerbations. -Monitor respiratory status -Consider initiating controller medication  History of bipolar disorder  depression Patient reports he is on no medications at this time -Monitor mood and for signs of depression  FEN/GI: Heart healthy, carb modified Prophylaxis: Lovenox  Disposition: Admit to inpatient teaching service   History of Present Illness:  [name] is a 59 y.o. female presenting with increasing right sided flank pain and low back pain with dysuria.  Patient reports strong history of multiple UTIs and episodes of pyelonephritis which she was treated for in the ED and discharged on antibiotics.  Patient reported that the pain increased last night and that she had episodes of sweating followed by chills.  She never checked her temperature.  Patient also reports that the pain got significantly worse last night.  She denies passing any kidney stones recently and says that the last time she remembers passing a kidney stone was a few weeks ago.  She does acknowledge that her urine has been cloudy over the past few weeks as well.    Regarding patient's other chronic medical conditions she reports that she has not seen her PCP in "a while".  She does not have insurance and so has not been taking any medications for her hypertension, COPD.  She does take medications for her diabetes.  Review Of Systems: Per HPI with the following additions:   Review of Systems  Constitutional: Positive for fever (subjective).  Gastrointestinal: Positive for diarrhea. Constipation: occasional loose stool.  Endocrine: Positive for heat intolerance.  Genitourinary: Positive for dysuria and flank pain.  Musculoskeletal: Positive for back pain (chronic with new right flanck pain).  Neurological: Positive for light-headedness.     Patient Active Problem List   Diagnosis Date Noted  .  Sepsis ([place]) 12/12/2019  . Hip pain 08/10/2019  . Acute pyelonephritis 01/08/2018  . Diabetes mellitus due to underlying condition, uncontrolled, with hyperglycemia, with long-term current use of insulin ([place]) 10/03/2016  . Recurrent kidney stones 10/03/2012  . 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 Obesity, Class III, BMI 40-49.9 (morbid obesity) ([place]) 11/08/2011  . DMII (diabetes mellitus, type 2) ([place]) 09/19/2010  . Hyperlipidemia 09/19/2010  . DISORDER, BIPOLAR NOS 09/09/2006  . DEPRESSION, MAJOR, RECURRENT 08/29/2006  . TOBACCO DEPENDENCE 08/29/2006  . HYPERTENSION, BENIGN SYSTEMIC 08/29/2006  . VENOUS INSUFFICIENCY, CHRONIC 08/29/2006    Past Medical History: Past Medical History:  Diagnosis Date  . BACK PAIN, LOW 08/29/2006  . CHF (congestive heart failure) ([place])   . DEPRESSION, MAJOR, RECURRENT 08/29/2006  . DISORDER, BIPOLAR NOS 09/09/2006  . DMII (diabetes mellitus, type 2) ([place]) 09/19/2010  . Hyperlipidemia 09/19/2010   LDL 78 on 08/2010. Recheck 08/2011.    Marland Kitchen HYPERTENSION, BENIGN SYSTEMIC 08/29/2006   Well controlled on Lopressor and lisinopril. Check BMET 12/2011    . Kidney infection   . OVARIAN MASS 09/18/2007   Benign ovarian cyst s/p laparotomy for removal   . TOBACCO DEPENDENCE 08/29/2006   Patient quit in early months of 2012. Continue to follow for possible relapse.    . Urosepsis 09/27/2012    Past Surgical History: Past Surgical History:  Procedure Laterality Date  .  PARTIAL HYSTERECTOMY     unsure if had cervix removed.   . stents Bilateral    Kidneys    Social History: Social History   Tobacco Use  . Smoking status: Current Every Day Smoker    Packs/day: 1.00    Types: Cigarettes  . Smokeless tobacco: Never Used  Substance Use Topics  . Alcohol use: Yes  . Drug use: No    Family History: No family history on file.   Allergies and Medications: Allergies  Allergen Reactions  .  Tamsulosin Shortness Of Breath   No current facility-administered medications on file prior to encounter.   Current Outpatient Medications on File Prior to Encounter  Medication Sig Dispense Refill  . acetaminophen (TYLENOL) 500 MG tablet Take 1,000 mg by mouth every 6 (six) hours as needed for mild pain.    Marland Kitchen aspirin 81 MG EC tablet Take 81 mg by mouth daily.     . Bismuth Subsalicylate (PEPTO-BISMOL) 262 MG TABS Take 1-2 tablets by mouth every 6 (six) hours as needed (for gas or "pressure").    . calcium carbonate (TUMS - DOSED IN MG ELEMENTAL CALCIUM) 500 MG chewable tablet Chew 1-2 tablets by mouth as needed for indigestion or heartburn.    . hydrocortisone cream 0.5 % Apply 1 application topically 2 (two) times daily. (Patient taking differently: Apply 1 application topically 2 (two) times daily as needed for itching (rash). ) 30 g 0  . insulin NPH Human (HUMULIN N,NOVOLIN N) 100 UNIT/ML injection Inject 0.1-0.12 mLs (10-12 Units total) into the skin 2 (two) times daily. 12 units q am, 10 units q hs (Patient taking differently: Inject 10-12 Units into the skin See admin instructions. Inject 10 units into the skin before breakfast and 10 units after supper) 10 mL 11  . insulin regular (HUMULIN R) 100 units/mL injection Inject 0.05 mLs (5 Units total) into the skin 2 (two) times daily before a meal. (Patient taking differently: Inject 10 Units into the skin 2 (two) times daily before a meal. ) 10 mL 11  . Multiple Vitamin (MULTIVITAMIN WITH MINERALS) TABS Take 1 tablet by mouth daily.    . simethicone (GAS-X EXTRA STRENGTH) 125 MG chewable tablet Chew 125 mg by mouth every 6 (six) hours as needed for flatulence.    . fluconazole (DIFLUCAN) 150 MG tablet Take 1 tablet (150 mg total) by mouth daily. (Patient not taking: Reported on 12/12/2019) 2 tablet 0  . HYDROcodone-acetaminophen (NORCO/VICODIN) 5-325 MG tablet Take 1 tablet by mouth every 4 (four) hours as needed. (Patient not taking: Reported on  12/12/2019) 12 tablet 0  . levofloxacin (LEVAQUIN) 500 MG tablet Take 1 tablet (500 mg total) by mouth daily. (Patient not taking: Reported on 12/12/2019) 10 tablet 0  . ondansetron (ZOFRAN) 4 MG tablet Take 1 tablet (4 mg total) by mouth every 6 (six) hours. (Patient not taking: Reported on 12/12/2019) 12 tablet 0    Objective: BP (!) 127/95 (BP Location: Left Arm)   Pulse 82   Temp 98.6 F (37 C) (Oral)   Resp 19   Ht 5' 4" (1.626 m)   Wt 130 kg   SpO2 94%   BMI 49.19 kg/m  Physical Exam Constitutional:      General: She is not in acute distress.    Appearance: She is obese. She is not ill-appearing.  HENT:     Head: Normocephalic and atraumatic.     Nose: No congestion or rhinorrhea.     Mouth/Throat:  Mouth: Mucous membranes are moist.     Pharynx: Oropharynx is clear.  Eyes:     Extraocular Movements: Extraocular movements intact.     Conjunctiva/sclera: Conjunctivae normal.     Pupils: Pupils are equal, round, and reactive to light.  Cardiovascular:     Rate and Rhythm: Regular rhythm. Tachycardia present.     Pulses: Normal pulses.     Heart sounds: Normal heart sounds.  Pulmonary:     Effort: Pulmonary effort is normal.     Breath sounds: Normal breath sounds.  Abdominal:     Palpations: Abdomen is soft.     Tenderness: There is abdominal tenderness (Tenderness to palpation in right lower quadrant). There is right CVA tenderness and guarding.  Musculoskeletal:        General: No swelling, tenderness or deformity.     Cervical back: Normal range of motion and neck supple.  Lymphadenopathy:     Cervical: No cervical adenopathy.  Skin:    General: Skin is warm and dry.  Neurological:     General: No focal deficit present.     Mental Status: She is alert.     Cranial Nerves: No cranial nerve deficit.    Labs and Imaging: CBC BMET  Recent Labs  Lab 12/12/19 0448  WBC 30.9*  HGB 16.7*  HCT 49.8*  PLT 300   Recent Labs  Lab 12/12/19 0448  NA 136  K  4.0  CL 102  CO2 20*  BUN 22*  CREATININE 1.58*  GLUCOSE 450*  CALCIUM 9.2    Lactic acid-3.1> 2.9 I-STAT hCG-5.9 PT/INR-13.3/1.1 APTT-29 Glucose 450> 312  CT ABDOMEN PELVIS W CONTRAST  Result Date: 12/12/2019 CLINICAL DATA:  Right low back pain with dysuria since last night. Possible pyelonephritis. EXAM: CT ABDOMEN AND PELVIS WITH CONTRAST TECHNIQUE: Multidetector CT imaging of the abdomen and pelvis was performed using the standard protocol following bolus administration of intravenous contrast. CONTRAST:  33m OMNIPAQUE IOHEXOL 300 MG/ML  SOLN COMPARISON:  01/08/2018 FINDINGS: Lower chest: Lung bases are clear. Minimal calcified plaque over the descending thoracic aorta. Hepatobiliary: Mild diffuse low-attenuation of the liver compatible with a degree of steatosis without focal mass. Previous cholecystectomy. Biliary tree is unremarkable. Pancreas: Normal. Spleen: Normal. Adrenals/Urinary Tract: Adrenal glands are normal. Kidneys are normal size there are patchy areas of left renal cortical scarring. Several nonobstructing left renal stones. There is delayed excretion by the right kidney. There is mild right-sided hydronephrosis with stranding/fluid in the right perinephric fat. Several right renal stones are present. 1.5 cm oval right midpole cortical hypodensity which may represent distended calyx, cyst or much less likely pyelonephritis. Ureters are normal in caliber without evidence of stones. Bladder is unremarkable. Stomach/Bowel: Stomach and small bowel are normal. Appendix is normal. Minimal diverticulosis of the colon. Vascular/Lymphatic: Mild-to-moderate calcified plaque over the abdominal aorta which is normal in caliber. Few small periaortic lymph nodes are present without significant change. Reproductive: Uterus just right of midline. Right ovary unremarkable. Left ovary not well visualized. Other: Surgical clip over the left pelvis. Musculoskeletal: Mild degenerative change of the  spine. IMPRESSION: 1. Bilateral nephrolithiasis. Mild right-sided hydronephrosis with perinephric inflammation/fluid. No obstructing ureteral stones visualized. Findings may be due to recent passage of a stone and less likely pyelonephritis/infection. 2.  Mild colonic diverticulosis. 3.  Mild hepatic steatosis without focal mass. 4.  Aortic Atherosclerosis (ICD10-I70.0). Electronically Signed   By: DMarin OlpM.D.   On: 12/12/2019 10:27     CGifford Shave MD 12/12/2019, 1:37  PM PGY-1, [place] Intern pager: [phone], text pages welcome  FPTS Upper-Level Resident Addendum   I have independently interviewed and examined the patient. I have discussed the above with the original author and agree with their documentation. My edits for correction/addition/clarification are in blue. Please see also any attending notes.    Matilde Haymaker MD PGY-2, [facility] 12/12/2019 9:20 PM  [place] Service pager: [phone] (text pages welcome through [facility])

## 2019-12-12 NOTE — Progress Notes (Signed)
Patient arrived to 6N19, aox4, vss, oriented to room, use of call light and bed controls within reach. Visitation policy informed. Denies pain. Will continue to monitor patient

## 2019-12-12 NOTE — ED Notes (Signed)
Pt's O2 sats observed to be in the upper 80's on room air after administration of morphine; pt placed on 2L O2 via [place]; O2 sats now in the 90's

## 2019-12-12 NOTE — ED Triage Notes (Signed)
Patient reports right low back pain with dysuria onset last night , denies hematuria or fever .

## 2019-12-13 DIAGNOSIS — I10 Essential (primary) hypertension: Secondary | ICD-10-CM

## 2019-12-13 DIAGNOSIS — N182 Chronic kidney disease, stage 2 (mild): Secondary | ICD-10-CM

## 2019-12-13 DIAGNOSIS — F319 Bipolar disorder, unspecified: Secondary | ICD-10-CM

## 2019-12-13 DIAGNOSIS — F17200 Nicotine dependence, unspecified, uncomplicated: Secondary | ICD-10-CM

## 2019-12-13 DIAGNOSIS — A4151 Sepsis due to Escherichia coli [E. coli]: Principal | ICD-10-CM

## 2019-12-13 LAB — BLOOD CULTURE ID PANEL (REFLEXED)
Acinetobacter baumannii: NOT DETECTED
Acinetobacter baumannii: NOT DETECTED
Candida albicans: NOT DETECTED
Candida albicans: NOT DETECTED
Candida glabrata: NOT DETECTED
Candida glabrata: NOT DETECTED
Candida krusei: NOT DETECTED
Candida krusei: NOT DETECTED
Candida parapsilosis: NOT DETECTED
Candida parapsilosis: NOT DETECTED
Candida tropicalis: NOT DETECTED
Candida tropicalis: NOT DETECTED
Carbapenem resistance: NOT DETECTED
Carbapenem resistance: NOT DETECTED
Enterobacter cloacae complex: NOT DETECTED
Enterobacter cloacae complex: NOT DETECTED
Enterobacteriaceae species: DETECTED — AB
Enterobacteriaceae species: DETECTED — AB
Enterococcus species: NOT DETECTED
Enterococcus species: NOT DETECTED
Escherichia coli: DETECTED — AB
Escherichia coli: DETECTED — AB
Haemophilus influenzae: NOT DETECTED
Haemophilus influenzae: NOT DETECTED
Klebsiella oxytoca: NOT DETECTED
Klebsiella oxytoca: NOT DETECTED
Klebsiella pneumoniae: NOT DETECTED
Klebsiella pneumoniae: NOT DETECTED
Listeria monocytogenes: NOT DETECTED
Listeria monocytogenes: NOT DETECTED
Neisseria meningitidis: NOT DETECTED
Neisseria meningitidis: NOT DETECTED
Proteus species: NOT DETECTED
Proteus species: NOT DETECTED
Pseudomonas aeruginosa: NOT DETECTED
Pseudomonas aeruginosa: NOT DETECTED
Serratia marcescens: NOT DETECTED
Serratia marcescens: NOT DETECTED
Staphylococcus aureus (BCID): NOT DETECTED
Staphylococcus aureus (BCID): NOT DETECTED
Staphylococcus species: NOT DETECTED
Staphylococcus species: NOT DETECTED
Streptococcus agalactiae: NOT DETECTED
Streptococcus agalactiae: NOT DETECTED
Streptococcus pneumoniae: NOT DETECTED
Streptococcus pneumoniae: NOT DETECTED
Streptococcus pyogenes: NOT DETECTED
Streptococcus pyogenes: NOT DETECTED
Streptococcus species: NOT DETECTED
Streptococcus species: NOT DETECTED

## 2019-12-13 LAB — BASIC METABOLIC PANEL
Anion gap: 10 (ref 5–15)
BUN: 19 mg/dL (ref 6–20)
CO2: 21 mmol/L — ABNORMAL LOW (ref 22–32)
Calcium: 8.9 mg/dL (ref 8.9–10.3)
Chloride: 105 mmol/L (ref 98–111)
Creatinine, Ser: 1.58 mg/dL — ABNORMAL HIGH (ref 0.44–1.00)
GFR calc Af Amer: 42 mL/min — ABNORMAL LOW (ref >60)
GFR calc non Af Amer: 36 mL/min — ABNORMAL LOW (ref >60)
Glucose, Bld: 264 mg/dL — ABNORMAL HIGH (ref 70–99)
Potassium: 4.1 mmol/L (ref 3.5–5.1)
Sodium: 136 mmol/L (ref 135–145)

## 2019-12-13 LAB — CBC
HCT: 42.9 % (ref 36.0–46.0)
Hemoglobin: 14.2 g/dL (ref 12.0–15.0)
MCH: 30.8 pg (ref 26.0–34.0)
MCHC: 33.1 g/dL (ref 30.0–36.0)
MCV: 93.1 fL (ref 80.0–100.0)
Platelets: 240 10*3/uL (ref 150–400)
RBC: 4.61 MIL/uL (ref 3.87–5.11)
RDW: 12.9 % (ref 11.5–15.5)
WBC: 33.9 10*3/uL — ABNORMAL HIGH (ref 4.0–10.5)
nRBC: 0 % (ref 0.0–0.2)

## 2019-12-13 LAB — GLUCOSE, CAPILLARY
Glucose-Capillary: 270 mg/dL — ABNORMAL HIGH (ref 70–99)
Glucose-Capillary: 245 mg/dL — ABNORMAL HIGH (ref 70–99)
Glucose-Capillary: 248 mg/dL — ABNORMAL HIGH (ref 70–99)
Glucose-Capillary: 176 mg/dL — ABNORMAL HIGH (ref 70–99)

## 2019-12-13 LAB — HEMOGLOBIN A1C
Hgb A1c MFr Bld: 10.9 % — ABNORMAL HIGH (ref 4.8–5.6)
Mean Plasma Glucose: 266.13 mg/dL

## 2019-12-13 MED ORDER — INSULIN NPH (HUMAN) (ISOPHANE) 100 UNIT/ML ~~LOC~~ SUSP
10.0000 [IU] | Freq: Two times a day (BID) | SUBCUTANEOUS | Status: DC
  Administered 2019-12-13 – 2019-12-14 (×2): 10 [IU] via SUBCUTANEOUS

## 2019-12-13 MED ORDER — SODIUM CHLORIDE 0.9 % IV SOLN
2.0000 g | INTRAVENOUS | Status: DC
  Administered 2019-12-13 – 2019-12-14 (×2): 2 g via INTRAVENOUS
  Filled 2019-12-13: qty 20
  Filled 2019-12-13 (×2): qty 2

## 2019-12-13 MED ORDER — OXYCODONE HCL 5 MG PO TABS
5.0000 mg | ORAL_TABLET | Freq: Four times a day (QID) | ORAL | Status: DC | PRN
  Administered 2019-12-13 – 2019-12-15 (×7): 5 mg via ORAL
  Filled 2019-12-13 (×7): qty 1

## 2019-12-13 NOTE — Evaluation (Signed)
Physical Therapy Evaluation Patient Details Name: [name] MRN: [id number] DOB: 1960/08/05 Today's Date: 12/13/2019   History of Present Illness  Patient is a 59 y/o female who presents with right low back pain with dysuria. Found to have sepsis secondary to pyelonephritis. PMH includes recurrent kidney stones, T2DM, HTN, CHF, HLD, history of urosepsis.  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 Today, pt tolerated bed mobility, transfers and gait training Mod I with 2/4 DOE. SP02 remained >94% on RA and HR up to 137 bpm. Pain in right back worsened with movement. Encouraged increasing activity and walking while in the hospital to maintain strength/mobility. Pt does not require skilled therapy services as pt functioning close to baseline. All education completed. Discharge from therapy.    Follow Up Recommendations No PT follow up;Supervision - Intermittent    Equipment Recommendations  None recommended by PT    Recommendations for Other Services       Precautions / Restrictions Precautions Precautions: None Restrictions Weight Bearing Restrictions: No      Mobility  Bed Mobility Overal bed mobility: Modified Independent             General bed mobility comments: No assist needed.  Transfers Overall transfer level: Needs assistance Equipment used: None Transfers: Sit to/from Stand Sit to Stand: Modified independent (Device/Increase time)         General transfer comment: No assist needed, no LOB.  Ambulation/Gait Ambulation/Gait assistance: Modified independent (Device/Increase time) Gait Distance (Feet): 150 Feet Assistive device: None Gait Pattern/deviations: Step-through pattern;Wide base of support   Gait velocity interpretation: 1.31 - 2.62 ft/sec, indicative of limited community ambulator General Gait Details: Slow, steady gait with wide BoS; 2/4 DOE. HR up to 137 bpm and Sp02 >94% on RA.  Stairs             Wheelchair Mobility    Modified Rankin (Stroke Patients Only)       Balance Overall balance assessment: No apparent balance deficits (not formally assessed)                                           Pertinent Vitals/Pain Pain Assessment: 0-10 Pain Score: 9  Pain Location: right back Pain Descriptors / Indicators: Aching;Discomfort Pain Intervention(s): Monitored during session;Repositioned    Home Living Family/patient expects to be discharged to:: Private residence Living Arrangements: Alone   Type of Home: Other(Comment) (Duplex) Home Access: Stairs to enter   Entrance Stairs-Number of Steps: 1 Home Layout: One level Home Equipment: Clinical cytogeneticist - 2 wheels      Prior Function Level of Independence: Independent         Comments: Security guard     Journalist, newspaper        Extremity/Trunk Assessment   Upper Extremity Assessment Upper Extremity Assessment: Defer to OT evaluation    Lower Extremity Assessment Lower Extremity Assessment: Overall WFL for tasks assessed    Cervical / Trunk Assessment Cervical / Trunk Assessment: Normal  Communication   Communication: No difficulties  Cognition Arousal/Alertness: Awake/alert Behavior During Therapy: WFL for tasks assessed/performed Overall Cognitive Status: Within Functional Limits for tasks assessed  General Comments General comments (skin integrity, edema, etc.): Reports worsened pain with walking.    Exercises     Assessment/Plan    PT Assessment Patent does not need any further PT services  PT Problem List         PT Treatment Interventions      PT Goals (Current goals can be found in the Care Plan section)  Acute Rehab PT Goals Patient Stated Goal: to get this pain better PT Goal Formulation: All assessment and education complete, DC therapy    Frequency     Barriers to discharge         Co-evaluation               AM-PAC PT "6 Clicks" Mobility  Outcome Measure Help needed turning from your back to your side while in a flat bed without using bedrails?: None Help needed moving from lying on your back to sitting on the side of a flat bed without using bedrails?: None Help needed moving to and from a bed to a chair (including a wheelchair)?: None Help needed standing up from a chair using your arms (e.g., wheelchair or bedside chair)?: None Help needed to walk in hospital room?: None Help needed climbing 3-5 steps with a railing? : A Little 6 Click Score: 23    End of Session   Activity Tolerance: Patient limited by pain Patient left: in bed;with call bell/phone within reach Nurse Communication: Mobility status PT Visit Diagnosis: Pain Pain - part of body:  (back)    Time: 9678-9381 PT Time Calculation (min) (ACUTE ONLY): 13 min   Charges:   PT Evaluation $PT Eval Low Complexity: 1 Low          Vale Haven, PT, DPT Acute Rehabilitation Services Pager [phone] Office [phone]      [name] 12/13/2019, 11:33 AM

## 2019-12-13 NOTE — Progress Notes (Signed)
Nutrition Education Note  RD contacted via on-call pager for [facility].   Provided "Kid[facility] Nutrition Therapy" to patient/family in discharge instructions. Reviewed food groups and provided written recommended serving sizes specifically determined for patient's current nutritional status.   Explained why diet restrictions are needed and provided lists of foods to limit/avoid that are high in calcium oxalate. Encouraged increased fluid intake that is spread out through each day, increased intake of fruits (especially citrus)/vegetables, and decreased daily sodium intake.   Patient mostly drinks water, diet sprite, and coffee. Eats fruits, vegetables, and mostly chicken (once daily). Has recently tried to decrease sodium intake by not eating processed meats or canned foods.  Do not think pt is consuming excessive amounts of calcium oxalate given provided nutrition history. Answered patient's questions. Expect good compliance.   Vanessa Kick RD, LDN Clinical Nutrition Pager listed in AMION

## 2019-12-13 NOTE — Progress Notes (Signed)
Family Medicine Teaching Service Daily Progress Note Intern Pager: [phone]  Patient name: [name] Medical record number: [id number] Date of birth: 07/30/1960 Age: 59 y.o. Gender: female  Primary Care Provider: Shirlean Mylar, MD Consultants: None Code Status: Full  Pt Overview and Major Events to Date:  12/12/2019 admitted for right-sided low back pain, dysuria  Assessment and Plan: [name] is a 59 y.o. female presenting with right-sided low back pain with dysuria. PMH is significant for recurrent kidney stones, T2DM, HTN, CHF, HLD, history of urosepsis.  Sepsis 2/2 pyelonephritis, improved Patient continues to have leukocytosis at 33.9, unchanged from the day prior.  T-max overnight 99.5.   Urine and blood cultures both growing E. coli, susceptibilities to follow.  CT abdomen showed bilateral nephrolithiasis with mild right-sided hydronephrosis perinephric inflammation, no obstructing ureteral stone appreciated.  CT also significant for 1.5 cm right renal hypodensity, at this point cannot rule out abscess. Patient most recently had urosepsis August 2020 and at the time grew multidrug-resistant Citrobacter. -Consider urology consult -Continue ceftriaxone (6/12-) -Follow-up blood and urine culture sensitivities -Pain control with Tylenol 650 every 8 hours, oxycodone 5 q6 hours -A.m. CBC, BMP -Every 4 vitals -PT/OT eval and treat -Consulting nutrition to discuss options for potentially decreasing occurrence of kidney stones   DMT2 Home medication includes insulin 10 units long-acting twice daily and 12 units short acting twice daily with meals.  HbA1c 10.9% on admission.  CBGs 221-320. -Monitor CBGs -10 units NPH twice daily -Moderate sensitivity sliding scale -Consider addition of GLP-1 or SGLT 2  CHF Patient reports diagnosis of congestive heart failure in the past but she is not on any medications.  Per our imaging she does not have any recent  echocardiogram or cardiac imaging.  There is a nuclear medicine study from 2002 showing EF of 60%. -Consider repeat echocardiogram if she shows evidence of fluid overload. -S/p 2 L of IVF in the ED.  Hold off on additional IVF and permit p.o. intake. -Monitor fluid status  COPD Patient reports significant smoking history and she smokes 1 pack a day at this time.  Reports history of diagnosis with COPD but that she is not on any controller medications for this.  No recent COPD exacerbations. -Monitor respiratory status -Consider initiating controller medication  History of bipolar disorder  depression Patient reports he is on no medications at this time -Monitor mood and for signs of depression  FEN/GI: Heart healthy, carb modified Prophylaxis: Lovenox  Disposition: Med-surge  Subjective:  Patient seen resting in bed, in mild discomfort, patient has family member in the room with her at bedside.  Objective: Temp:  [98.1 F (36.7 C)-99.3 F (37.4 C)] 98.1 F (36.7 C) (06/13 0404) Pulse Rate:  [82-112] 96 (06/13 0404) Resp:  [16-19] 18 (06/13 0404) BP: (125-144)/(55-95) 128/55 (06/13 0404) SpO2:  [92 %-100 %] 99 % (06/13 0404) Physical Exam: General: Patient in mild discomfort in right flank, nontoxic-appearing Cardiovascular: RRR, S1-S2 present Respiratory: CTA bilaterally Abdomen: Normal bowel sounds appreciated  Laboratory: Recent Labs  Lab 12/12/19 0448 12/12/19 1312 12/13/19 0327  WBC 30.9* 33.9* 33.9*  HGB 16.7* 15.1* 14.2  HCT 49.8* 44.5 42.9  PLT 300 249 240   Recent Labs  Lab 12/12/19 0448 12/12/19 1312 12/13/19 0327  NA 136  --  136  K 4.0  --  4.1  CL 102  --  105  CO2 20*  --  21*  BUN 22*  --  19  CREATININE 1.58* 1.69* 1.58*  CALCIUM 9.2  --  8.9  GLUCOSE 450*  --  264*   Blood culture: E. coli, sensitivities to follow  Imaging/Diagnostic Tests: CT ABDOMEN PELVIS W CONTRAST (12/12/19):  1. Bilateral nephrolithiasis. Mild right-sided  hydronephrosis with perinephric inflammation/fluid. No obstructing ureteral stones visualized. Findings may be due to recent passage of a stone and less likely pyelonephritis/infection.  2.  Mild colonic diverticulosis.  3.  Mild hepatic steatosis without focal mass.  4.  Aortic Atherosclerosis (ICD10-I70.0).    Holly Floro, DO 12/13/2019, 10:17 AM PGY-2, [place] Intern pager: [phone], text pages welcome

## 2019-12-13 NOTE — Progress Notes (Addendum)
Occupational Therapy Evaluation and Discharge Patient Details Name: [name] MRN: [id number] DOB: October 14, 1960 Today's Date: 12/13/2019    History of Present Illness Patient is a 59 y/o female who presents with right low back pain with dysuria. Found to have sepsis secondary to pyelonephritis. PMH includes recurrent kidney stones, T2DM, HTN, CHF, HLD, history of urosepsis.   Clinical Impression   Prior to hospitalization, pt was Independent with ADLs/IADLs, works as a Presenter, broadcasting, and receives PRN assistance with IADLs (grocery shopping) from daughter. Pt's daughter drives pt to appointments and work secondary to visual concerns. Pt admitted for above and limited by increased pain/discomfort and decreased activity tolerance. Pt received sitting upright in bed with daughter present at bedside. Monitored SPO2 throughout eval, pt maintained 92-94% oxygen on room air while participating in functional activities. Pt performed functional transfers/ambulation throughout room with Mod I for extended time secondary to pain. Pt performed perineal care while sitting on toilet independently and washed hands at sink with supervision. Educated pt/daughter on energy conservation strategies to incorporate into daily ADL/IADL routine. Pt/daughter confirmed that pt is very close to baseline. Continued skilled OT services not warranted at this time. Recommend intermittent supervision from daughter for more complex ADLs/IADLs to ensure safety. If something changes, please re-consult. OT signing off.      Follow Up Recommendations  No OT follow up;Supervision - Intermittent (from a safety standpoint secondary to increased pain)    Equipment Recommendations  None recommended by OT    Recommendations for Other Services       Precautions / Restrictions Precautions Precautions: None Restrictions Weight Bearing Restrictions: No      Mobility Bed Mobility Overal bed mobility: Modified Independent              General bed mobility comments: extended time taken secondary to R flank pain  Transfers Overall transfer level: Modified independent Equipment used: None Transfers: Sit to/from Stand Sit to Stand: Modified independent (Device/Increase time)         General transfer comment: No assist needed, no LOB.    Balance Overall balance assessment: No apparent balance deficits (not formally assessed)                                         ADL either performed or assessed with clinical judgement   ADL Overall ADL's : Modified independent                                       General ADL Comments: pt requires extended time to perform BADLs secondary to pain/discomfort; supervision for more complex ADLs     Vision Baseline Vision/History: Wears glasses Wears Glasses: At all times Patient Visual Report: No change from baseline       Perception     Praxis      Pertinent Vitals/Pain Pain Assessment: 0-10 Pain Score: 8  Faces Pain Scale: Hurts even more Pain Location: R flank Pain Descriptors / Indicators: Aching;Discomfort;Dull;Grimacing;Guarding Pain Intervention(s): Limited activity within patient's tolerance;Monitored during session;Premedicated before session;Repositioned     Hand Dominance Right   Extremity/Trunk Assessment Upper Extremity Assessment Upper Extremity Assessment: Overall WFL for tasks assessed   Lower Extremity Assessment Lower Extremity Assessment: Defer to PT evaluation   Cervical / Trunk Assessment Cervical / Trunk Assessment: Normal   Communication  Communication Communication: No difficulties   Cognition Arousal/Alertness: Lethargic;Suspect due to medications Behavior During Therapy: [name] for tasks assessed/performed Overall Cognitive Status: Within Functional Limits for tasks assessed                                 General Comments: pt somewhat drowsy secondary to medications    General Comments  grimacing in pain as she ambulated to/from toilet    Exercises     Shoulder Instructions      Home Living Family/patient expects to be discharged to:: Private residence Living Arrangements: Alone Available Help at Discharge: Family;Available PRN/intermittently (daughter reports living close by) Type of Home: Other(Comment) (duplex) Home Access: Stairs to enter Entrance Stairs-Number of Steps: 1   Home Layout: One level     Bathroom Shower/Tub: Chief Strategy Officer: Standard     Home Equipment: Environmental consultant - 2 wheels;Shower seat;Grab bars - tub/shower          Prior Functioning/Environment Level of Independence: Independent        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        OT Problem List: Decreased activity tolerance;Pain      OT Treatment/Interventions:      OT Goals(Current goals can be found in the care plan section) Acute Rehab OT Goals Patient Stated Goal: decrease pain  OT Frequency:     Barriers to D/C:            Co-evaluation              AM-PAC OT "6 Clicks" Daily Activity     Outcome Measure Help from another person eating meals?: None Help from another person taking care of personal grooming?: None Help from another person toileting, which includes using toliet, bedpan, or urinal?: None Help from another person bathing (including washing, rinsing, drying)?: A Little Help from another person to put on and taking off regular upper body clothing?: None Help from another person to put on and taking off regular lower body clothing?: None 6 Click Score: 23   End of Session Nurse Communication: Mobility status;Precautions (to check SPO2)  Activity Tolerance: Patient limited by pain Patient left: in chair;with call bell/phone within reach;with nursing/sitter in room;with family/visitor present  OT Visit Diagnosis: Muscle weakness (generalized) (M62.81);Pain Pain -  Right/Left: Right Pain - part of body:  (abdomen)                Time: 1207-1222 OT Time Calculation (min): 15 min Charges:  OT General Charges $OT Visit: 1 Visit OT Evaluation $OT Eval Low Complexity: 1 Low  Norris Cross, OTR/L [facility] Acute Rehab Services [phone]  Mechele Claude 12/13/2019, 12:46 PM

## 2019-12-13 NOTE — Progress Notes (Signed)
PHARMACY - PHYSICIAN COMMUNICATION CRITICAL VALUE ALERT - BLOOD CULTURE IDENTIFICATION (BCID)  [name] is an 59 y.o. female who presented to [facility] on 12/12/2019 with a chief complaint of pyelonephritis  Assessment:  4/4 BC with E coli  Name of physician (or Provider) Contacted: FMTS  Current antibiotics: Received rocephin 1gm IV at 07:56 and 14:34 but no standing orders  Changes to prescribed antibiotics recommended:  Rocephin 2gm IV q24 hours  Results for orders placed or performed during the hospital encounter of 12/12/19  Blood Culture ID Panel (Reflexed) (Collected: 12/12/2019  7:34 AM)  Result Value Ref Range   Enterococcus species NOT DETECTED NOT DETECTED   Listeria monocytogenes NOT DETECTED NOT DETECTED   Staphylococcus species NOT DETECTED NOT DETECTED   Staphylococcus aureus (BCID) NOT DETECTED NOT DETECTED   Streptococcus species NOT DETECTED NOT DETECTED   Streptococcus agalactiae NOT DETECTED NOT DETECTED   Streptococcus pneumoniae NOT DETECTED NOT DETECTED   Streptococcus pyogenes NOT DETECTED NOT DETECTED   Acinetobacter baumannii NOT DETECTED NOT DETECTED   Enterobacteriaceae species DETECTED (A) NOT DETECTED   Enterobacter cloacae complex NOT DETECTED NOT DETECTED   Escherichia coli DETECTED (A) NOT DETECTED   Klebsiella oxytoca NOT DETECTED NOT DETECTED   Klebsiella pneumoniae NOT DETECTED NOT DETECTED   Proteus species NOT DETECTED NOT DETECTED   Serratia marcescens NOT DETECTED NOT DETECTED   Carbapenem resistance NOT DETECTED NOT DETECTED   Haemophilus influenzae NOT DETECTED NOT DETECTED   Neisseria meningitidis NOT DETECTED NOT DETECTED   Pseudomonas aeruginosa NOT DETECTED NOT DETECTED   Candida albicans NOT DETECTED NOT DETECTED   Candida glabrata NOT DETECTED NOT DETECTED   Candida krusei NOT DETECTED NOT DETECTED   Candida parapsilosis NOT DETECTED NOT DETECTED   Candida tropicalis NOT DETECTED NOT DETECTED    Talbert Cage  [place] 12/13/2019  2:28 AM

## 2019-12-14 DIAGNOSIS — R7881 Bacteremia: Secondary | ICD-10-CM

## 2019-12-14 DIAGNOSIS — B9620 Unspecified Escherichia coli [E. coli] as the cause of diseases classified elsewhere: Secondary | ICD-10-CM

## 2019-12-14 LAB — CULTURE, BLOOD (ROUTINE X 2): Special Requests: ADEQUATE

## 2019-12-14 LAB — CBC
HCT: 38 % (ref 36.0–46.0)
Hemoglobin: 12.8 g/dL (ref 12.0–15.0)
MCH: 31.4 pg (ref 26.0–34.0)
MCHC: 33.7 g/dL (ref 30.0–36.0)
MCV: 93.1 fL (ref 80.0–100.0)
Platelets: 207 10*3/uL (ref 150–400)
RBC: 4.08 MIL/uL (ref 3.87–5.11)
RDW: 12.8 % (ref 11.5–15.5)
WBC: 16.2 10*3/uL — ABNORMAL HIGH (ref 4.0–10.5)
nRBC: 0 % (ref 0.0–0.2)

## 2019-12-14 LAB — GLUCOSE, CAPILLARY
Glucose-Capillary: 300 mg/dL — ABNORMAL HIGH (ref 70–99)
Glucose-Capillary: 144 mg/dL — ABNORMAL HIGH (ref 70–99)
Glucose-Capillary: 104 mg/dL — ABNORMAL HIGH (ref 70–99)
Glucose-Capillary: 118 mg/dL — ABNORMAL HIGH (ref 70–99)

## 2019-12-14 LAB — BASIC METABOLIC PANEL
Anion gap: 9 (ref 5–15)
BUN: 17 mg/dL (ref 6–20)
CO2: 23 mmol/L (ref 22–32)
Calcium: 8.5 mg/dL — ABNORMAL LOW (ref 8.9–10.3)
Chloride: 100 mmol/L (ref 98–111)
Creatinine, Ser: 1.45 mg/dL — ABNORMAL HIGH (ref 0.44–1.00)
GFR calc Af Amer: 46 mL/min — ABNORMAL LOW (ref >60)
GFR calc non Af Amer: 40 mL/min — ABNORMAL LOW (ref >60)
Glucose, Bld: 302 mg/dL — ABNORMAL HIGH (ref 70–99)
Potassium: 3.8 mmol/L (ref 3.5–5.1)
Sodium: 132 mmol/L — ABNORMAL LOW (ref 135–145)

## 2019-12-14 LAB — URINE CULTURE: Culture: >=100000 — AB

## 2019-12-14 MED ORDER — ENOXAPARIN SODIUM 60 MG/0.6ML ~~LOC~~ SOLN
60.0000 mg | SUBCUTANEOUS | Status: DC
  Administered 2019-12-14 – 2019-12-15 (×2): 60 mg via SUBCUTANEOUS
  Filled 2019-12-14 (×2): qty 0.6

## 2019-12-14 MED ORDER — INSULIN ASPART 100 UNIT/ML ~~LOC~~ SOLN
3.0000 [IU] | Freq: Three times a day (TID) | SUBCUTANEOUS | Status: DC
  Administered 2019-12-14 – 2019-12-15 (×4): 3 [IU] via SUBCUTANEOUS

## 2019-12-14 MED ORDER — CEFAZOLIN SODIUM-DEXTROSE 2-4 GM/100ML-% IV SOLN
2.0000 g | Freq: Three times a day (TID) | INTRAVENOUS | Status: DC
  Administered 2019-12-14 – 2019-12-15 (×3): 2 g via INTRAVENOUS
  Filled 2019-12-14 (×5): qty 100

## 2019-12-14 MED ORDER — INSULIN NPH (HUMAN) (ISOPHANE) 100 UNIT/ML ~~LOC~~ SUSP
12.0000 [IU] | Freq: Two times a day (BID) | SUBCUTANEOUS | Status: DC
  Administered 2019-12-14 – 2019-12-15 (×2): 12 [IU] via SUBCUTANEOUS

## 2019-12-14 NOTE — Care Management (Signed)
Consult for medication assistance. Changed pharmacy to [facility] pharmacy, will see if patient qualifies for [facility] assistance.   Ronny Flurry RN

## 2019-12-14 NOTE — Progress Notes (Addendum)
Inpatient Diabetes Program Recommendations  AACE/ADA: New Consensus Statement on Inpatient Glycemic Control (2015)  Target Ranges:  Prepandial:   less than 140 mg/dL      Peak postprandial:   less than 180 mg/dL (1-2 hours)      Critically ill patients:  140 - 180 mg/dL   Results for [name], [name] (MRN [id number]) as of 12/14/2019 07:48  Ref. Range 12/12/2019 04:48  Glucose Latest Ref Range: 70 - 99 mg/dL 450 (H)   Results for [name], [name] (MRN [id number]) as of 12/14/2019 07:48  Ref. Range 12/13/2019 08:11 12/13/2019 12:05 12/13/2019 17:06 12/13/2019 20:51  Glucose-Capillary Latest Ref Range: 70 - 99 mg/dL 270 (H)  8 units NOVOLOG +  5 units NPH Insulin  245 (H)  5 units NOVOLOG  248 (H)  5 units NOVOLOG  176 (H)     10 units NPH Insulin   Results for [name], [name] (MRN [id number]) as of 12/14/2019 08:19  Ref. Range 12/14/2019 07:55  Glucose-Capillary Latest Ref Range: 70 - 99 mg/dL 300 (H)   Results for [name], [name] (MRN [id number]) as of 12/14/2019 07:48  Ref. Range 12/13/2019 03:27  Hemoglobin A1C Latest Ref Range: 4.8 - 5.6 % 10.9 (H)  (266 mg/dl)    Admit with: Sepsis 2/2 pyelonephritis  History: DM, CHF, COPD  Home DM Meds: NPH Insulin 10 units BID       Regular 10 units BID  Current Orders: NPH Insulin 10 units BID      Novolog Moderate Correction Scale/ SSI (0-15 units) TID AC     MD- Note NPH Insulin increased to 10 units BID last PM.    CBG 300 mg/dl this AM.  Please consider the following:  1. Increase NPH Insulin to 15 units BID (~0.25 units/kg)  2. Start Novolog Meal Coverage: Novolog 3 units TID with meals  (Please add the following Hold Parameters: Hold if pt eats <50% of meal, Hold if pt NPO)     Addendum 11am--Met w/ pt this AM at bedside to discuss current A1c of 10.9%.  Pt told me she has NPH insulin, Regular Insulin, and CBG meter and supplies at home.  Usually buys insulin from [facility] for lower cost.  Stated  to me she hasn't checked her CBGs in over a week b/c she has felt so bad but usually checks when she feels well.  Told me her CBGs have been consistently >200 at home when she checks.  Sees MD at the [facility] center.  Told me she has been trying to eat better at home.  Usually avoids beverages with sugar and drinks mostly unsweet tea, water, and coffee.  Told me she has been juicing lately.  Discussed w/ pt that juicing fruit and drinking all the juice can lead to elevated CBGs at home.  Pt told me she tries to juice mostly vegetables and only add a little fruit to her juiced beverages.  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  Encouraged pt to include a protein source when having a carb snack (ie peanut butter or cheese with her crackers).  Explained what an A1c is and what it measures.  Reminded patient that her goal A1c is 7% or less per ADA standards to prevent both acute and long-term complications.  Explained to patient the extreme importance of good glucose control at home.  Encouraged patient to check her  CBGs at least bid at home (before taking her insulin) and to record all CBGs in a logbook for her PCP to review.  Discussed w/ pt that she may need intensified insulin regimen for home given her A1c is elevated.  Explained to pt that we will check her CBGs frequently here in the hospital and that the MDs will make insulin adjustments as needed for both inpatient and home regimens.     --Will follow patient during hospitalization--  Wyn Quaker RN, MSN, CDE Diabetes Coordinator Inpatient Glycemic Control Team Team Pager: [phone] (8a-5p)

## 2019-12-14 NOTE — Plan of Care (Signed)
  Problem: Education: Goal: Knowledge of General Education information will improve Description Including pain rating scale, medication(s)/side effects and non-pharmacologic comfort measures Outcome: Progressing   

## 2019-12-14 NOTE — Progress Notes (Addendum)
Family Medicine Teaching Service Daily Progress Note Intern Pager: [phone]  Patient name: [name] Medical record number: [id number] Date of birth: 16-Jun-1961 Age: 59 y.o. Gender: female  Primary Care Provider: Gladys Damme, MD Consultants: None Code Status: Full  Pt Overview and Major Events to Date:  12/12/2019 admitted for right-sided low back pain, dysuria  Assessment and Plan: [name] is a 59 y.o. female presenting with right-sided low back pain with dysuria. PMH is significant for recurrent kidney stones, T2DM, HTN, CHF, HLD, history of urosepsis.  Sepsis  E coli Bacteremia  Blood and Urine culture positive for E.coli. Patient febrile overnight 100.5 F around 2240. Repeat blood cultures to be obtained this morning. Patient on CTX but at the time of the fever patient had not been on antibiotics > 48 hr. First dose of CTX ~0800 on 6/12. Repeat CT ABD/Pelvis for abscess if patient has another fever. Patient with worsening leukocytosis at WBC 16.2.  CT abdomen showed bilateral nephrolithiasis with mild right-sided hydronephrosis perinephric inflammation, no obstructing ureteral stone appreciated.  CT also significant for 1.5 cm right renal hypodensity, at this point cannot rule out abscess. Patient most recently had urosepsis August 2020 and at the time grew multidrug-resistant Citrobacter. - Consider ID consult,  -Consider urology consult -Discontinue ceftriaxone (6/12-) -Start Ancef (6/14 - )  - Transition to oral therapy tomorrow.  -Follow-up blood and urine culture sensitivities -Pain control with Tylenol 650 every 8 hours, oxycodone 5 q6 hours -AM CBC, BMP -Every 4 vitals -PT/OT eval and treat -Consulting nutrition to discuss options for potentially decreasing occurrence of kidney stones   DMT2 Fasting CBG 300.  HbA1c 10.9% on admission. Home medication includes insulin 10 units long-acting twice daily and 12 units short acting twice daily with  meals.  -Monitor CBGs -12 units NPH twice daily -- Add meal time coverage 3 units  -Moderate sensitivity sliding scale -Consider addition of GLP-1 or SGLT 2  CHF Stable. Euvolemic on exam. Patient reports diagnosis of congestive heart failure. There is a nuclear medicine study from 2002 showing EF of 60%. -ECHO outpatient  -S/p 2 L of IVF in the ED.  Hold off on additional IVF and permit p.o. intake. -Monitor fluid status  COPD Stable. Smokes 1 pack a day at this time.  Reported hx of COPD but no controlled medications. No PFTs visible on chart review.  -Monitor respiratory status -Consider initiating controller medication - PFTs outpatient   History of bipolar disorder  depression Stable. No home medications  -Monitor mood and for signs of depression  FEN/GI: Heart healthy, carb modified Prophylaxis: Lovenox  Disposition: Med-surge  Subjective:   Patient doing well this morning.  Has right sided abdominal/flank pain.   Objective: Temp:  [98.3 F (36.8 C)-100.5 F (38.1 C)] 98.4 F (36.9 C) (06/14 0540) Pulse Rate:  [87-105] 87 (06/14 0540) Resp:  [16-18] 16 (06/14 0540) BP: (123-134)/(57-66) 127/66 (06/14 0540) SpO2:  [93 %-97 %] 97 % (06/14 0540)  Physical Exam: GEN: resting in bed, in no acute distress  CV: regular rate and rhythm, no murmurs appreciated  RESP: no increased work of breathing, clear to ascultation bilaterally  ABD: bowel sounds present, non-tender though patient reports pain, no CVA tenderness  MSK: no LE edema, or non-tender  SKIN: warm, dry    Laboratory: Recent Labs  Lab 12/12/19 0448 12/12/19 1312 12/13/19 0327  WBC 30.9* 33.9* 33.9*  HGB 16.7* 15.1* 14.2  HCT 49.8* 44.5 42.9  PLT 300 249 240  Recent Labs  Lab 12/12/19 0448 12/12/19 0448 12/12/19 1312 12/13/19 0327 12/14/19 0801  NA 136  --   --  136 132*  K 4.0  --   --  4.1 3.8  CL 102  --   --  105 100  CO2 20*  --   --  21* 23  BUN 22*  --   --  19 17   CREATININE 1.58*   < > 1.69* 1.58* 1.45*  CALCIUM 9.2  --   --  8.9 8.5*  GLUCOSE 450*  --   --  264* 302*   < > = values in this interval not displayed.   Blood culture: E. coli, sensitivities to follow  Imaging/Diagnostic Tests: CT ABDOMEN PELVIS W CONTRAST (12/12/19):  1. Bilateral nephrolithiasis. Mild right-sided hydronephrosis with perinephric inflammation/fluid. No obstructing ureteral stones visualized. Findings may be due to recent passage of a stone and less likely pyelonephritis/infection.  2.  Mild colonic diverticulosis.  3.  Mild hepatic steatosis without focal mass.  4.  Aortic Atherosclerosis (ICD10-I70.0).   No results found.  Katha Cabal, DO 12/14/2019, 9:00 AM PGY-1, [facility] Family Medicine FPTS Intern pager: [phone], text pages welcome

## 2019-12-15 DIAGNOSIS — E118 Type 2 diabetes mellitus with unspecified complications: Secondary | ICD-10-CM

## 2019-12-15 LAB — BASIC METABOLIC PANEL
Anion gap: 9 (ref 5–15)
BUN: 21 mg/dL — ABNORMAL HIGH (ref 6–20)
CO2: 22 mmol/L (ref 22–32)
Calcium: 8.5 mg/dL — ABNORMAL LOW (ref 8.9–10.3)
Chloride: 103 mmol/L (ref 98–111)
Creatinine, Ser: 1.3 mg/dL — ABNORMAL HIGH (ref 0.44–1.00)
GFR calc Af Amer: 53 mL/min — ABNORMAL LOW (ref >60)
GFR calc non Af Amer: 46 mL/min — ABNORMAL LOW (ref >60)
Glucose, Bld: 108 mg/dL — ABNORMAL HIGH (ref 70–99)
Potassium: 3.4 mmol/L — ABNORMAL LOW (ref 3.5–5.1)
Sodium: 134 mmol/L — ABNORMAL LOW (ref 135–145)

## 2019-12-15 LAB — CULTURE, BLOOD (ROUTINE X 2): Special Requests: ADEQUATE

## 2019-12-15 LAB — CBC
HCT: 37.4 % (ref 36.0–46.0)
Hemoglobin: 12.5 g/dL (ref 12.0–15.0)
MCH: 30.7 pg (ref 26.0–34.0)
MCHC: 33.4 g/dL (ref 30.0–36.0)
MCV: 91.9 fL (ref 80.0–100.0)
Platelets: 235 10*3/uL (ref 150–400)
RBC: 4.07 MIL/uL (ref 3.87–5.11)
RDW: 12.7 % (ref 11.5–15.5)
WBC: 12.3 10*3/uL — ABNORMAL HIGH (ref 4.0–10.5)
nRBC: 0.2 % (ref 0.0–0.2)

## 2019-12-15 LAB — URINE CULTURE: Culture: <10000 — AB

## 2019-12-15 LAB — GLUCOSE, CAPILLARY
Glucose-Capillary: 183 mg/dL — ABNORMAL HIGH (ref 70–99)
Glucose-Capillary: 118 mg/dL — ABNORMAL HIGH (ref 70–99)

## 2019-12-15 MED ORDER — INSULIN NPH (HUMAN) (ISOPHANE) 100 UNIT/ML ~~LOC~~ SUSP: 14.0000 [IU] | Freq: Two times a day (BID) | SUBCUTANEOUS | Status: DC

## 2019-12-15 MED ORDER — FLUCONAZOLE 150 MG PO TABS
150.0000 mg | ORAL_TABLET | Freq: Every day | ORAL | 0 refills | Status: DC
Start: 2019-12-15 — End: 2019-12-21

## 2019-12-15 MED ORDER — INSULIN REGULAR HUMAN 100 UNIT/ML IJ SOLN: 10.0000 [IU] | Freq: Two times a day (BID) | INTRAMUSCULAR | 0 refills | Status: DC

## 2019-12-15 MED ORDER — INSULIN NPH (HUMAN) (ISOPHANE) 100 UNIT/ML ~~LOC~~ SUSP: 10.0000 [IU] | Freq: Two times a day (BID) | SUBCUTANEOUS | 11 refills | Status: DC

## 2019-12-15 MED ORDER — CEPHALEXIN 500 MG PO CAPS: 1000.0000 mg | ORAL_CAPSULE | Freq: Three times a day (TID) | ORAL | 0 refills | Status: AC

## 2019-12-15 MED ORDER — INSULIN NPH (HUMAN) (ISOPHANE) 100 UNIT/ML ~~LOC~~ SUSP: SUBCUTANEOUS | 11 refills | Status: DC

## 2019-12-15 MED FILL — NovoLIN N FLEXPEN 100 UNIT/: 100 | 30 days supply | Qty: 9 | Fill #0

## 2019-12-15 MED FILL — PENTIPS 32G X 4 MM MISC: 32G X 4 MM | 25 days supply | Qty: 100 | Fill #0

## 2019-12-15 MED FILL — FLUCONAZOLE 150 MG TABS: 150 | 2 days supply | Qty: 2 | Fill #0

## 2019-12-15 MED FILL — CEPHALEXIN 500 MG CAPS: 500 | 7 days supply | Qty: 42 | Fill #0

## 2019-12-15 NOTE — Progress Notes (Signed)
AVS given and reviewed with pt. Medications discussed and printed prescriptions provided. All questions answered to satisfaction. Pt verbalized understanding of information given. Pt escorted off the unit with all belongings via wheelchair by staff member.

## 2019-12-15 NOTE — Discharge Instructions (Addendum)
You were hospitalized at [facility] due to right sided pain.  We expect this is from bacteria (E. Coli) growing in your blood stream and urine  which improved after antibiotics and pain control.   We are so glad you are feeling better. Please be sure to follow-up with your PCP (Dr. Leary Roca) at your earliest convenience.  Thank you for allowing [place] to take care of you.  - You have a follow up appointment with your PCP on 12/21/19  - Continue to take your antibiotics (three times daily)  - Talk with your PCP about a referral to Urology   Take care, [facility] family medicine team     Kidney Stones Nutrition Therapy  Diet can influence the formation and growth of kidney stones. Using diet to prevent kidney stones may mean changing what you eat. Depending on your individual risk factors and on the type of kidney stones you form, you may be advised to make changes. Nutrition therapy, as with other forms of therapy, is prescribed individually according to you and your specific needs and risks. Your registered dietitian (RD) will design and explain a plan that meets your needs.    Risk factors for kidney stones vary between individuals and can even change within an individual over time. You probably won't need to follow everything on this handout. Your RD will review and explain the items that are important for your stone prevention.    Your Risk Factors for Kidney [employer] and Growth Are:    ___ High urine calcium  ___ Low urine volume      ___ Low calcium intake  ___ High urine uric acid  ___ Low urine magnesium     ___ High salt intake  ___ High urine oxalate  ___ Low urine citrate      ___ High calcium intake  ___ High acid urine   ___ Low fruit and vegetable intake  ___ Acid load of diet is high    Your Nutrition Therapy Checklist     Therapies that apply to you are checked.    ___ Increase your urine volume by drinking more fluids (usually 2 liters of urine or more is the goal).      . Drinking at least 3 liters (at least 3 quarts or 100 ounces) of fluids per day is the best way to lower your risk for forming new stones. You may have to drink more than this if you exercise heavily or are in hot weather for long periods of time.  Marland Kitchen Spread your fluid intake throughout the day and night. All drinks count toward your fluid intake, including water, fruit juice, coffee, tea, milk, soda, and lemonade.  . Drink more low-sugar, low-calorie beverages so you do not get too many of your calories from beverages.  . Drink beer, wine, and alcoholic spirits in moderation, if at all.     HERE'S A TIP  Divide your day into 3 parts. Plan to drink at least 32 ounces in each part of the day. This will get you close to at least 100 ounces of fluids for the day.        Copyright Academy of Nutrition and Dietetics. This handout may be duplicated for client education.   Kidney Stones Nutrition Therapy--Page 1      ___ Reduce urine calcium by reducing your salt intake.    Reducing sodium (salt) intake is a powerful way to reduce urine calcium. Most diets contain too much sodium. This  may increase the amount of calcium your kidneys let out into the urine. Believe it or not, only about 10% of our salt intake comes from the salt shaker! The rest comes mostly from processed and prepared foods.    The sodium in your 24-hour urine collection is a good estimate of your intake. Based on your urine, your intake appears to add to your urine calcium. The recommended amount of sodium is 1,500 milligrams per day. Most people get more than this even if they don't use the salt shaker. Start reducing salt by just eating less than you do now. Choose "no salt added" or "low-salt" foods as much as possible. Particularly high-salt foods to limit include:    Cheese (all types)  Most frozen foods and meals  Salty, cured meats and deli meats   Hot dogs, bratwurst, and sausages  Canned soups and vegetables  Breads,  bagels, rolls, and baked goods   Salty snacks (chips, pretzels)  Certain salad dressings  Certain breakfast cereals   Pickles and olives  Casseroles and other "mixed" foods  Pizza, lasagna   Canned and bottled pasta sauces  Certain condiments  Table salt and some spice blends     ___ Reduce urine calcium by balancing your diet.    Balancing your diet for acid is important if your urine calcium is high. A high "acid load" diet may cause your bones to release more calcium into the bloodstream than they should. This can add to high urine calcium.     The foods causing the highest acid load are:  . Cheese  . Meats of all types  . Fish and seafood  . Poultry    Reduce the number of times you eat these foods in a week and eat smaller portions of these foods.    Foods in the grain group--breads, cereals, rice, and pasta--also add to the acid load of the diet, but not as much as cheese, meat, poultry, and fish. Fewer grain foods, or smaller portions, may help reduce urine calcium.    Milk and yogurt do not need to be restricted unless otherwise recommended.    Nearly all fruits and vegetables have the opposite effect of acid. Eating more of these will help balance your diet against the acidic effects of meats and cheese.      Note: Patients born with primary hyperoxaluria, usually diagnosed in  childhood, require a  different set of strategies to control urine oxalate than those listed here.    ___ Reduce urine oxalate: 4 strategies    There are different ways to lower the amount of oxalate in your urine. How you do so will depend on why your urine oxalate levels are high. It is unlikely that all the strategies below apply to you. Those that are circled apply to you most.    1. Eat or drink something with about 300 milligrams of calcium at each meal and snack.    WHY? Calcium and oxalate bind in the digestive tract and get removed in the stool.  Less oxalate is absorbed and available to the  urine. Foods and beverages are best  for including calcium; non-dairy sources are available for those who may be lactose intolerant or who otherwise avoid or limit dairy. Calcium supplements may be included, but you should not have more than 1,200 to 1,500 milligrams of calcium a day. (If you have short bowel or malabsorption, you may be advised to use more calcium.)    2. Eat  fewer "high-oxalate" foods.    WHY? The more oxalate absorbed from your digestive tract, the more in your urine. High-oxalate foods to limit, if you eat them, are:  . Spinach  . Rhubarb  . Beets  . Potato chips  . [place] fries  . Nuts and nut butters    You do not need to cut out other healthy foods that provide some oxalate. In fact, oxalate is practically unavoidable, because most plant foods have some. Often a combination of calcium from foods or beverages with meals and fewer high-oxalate foods is required.    3. Stop taking vitamin C supplements.    WHY? When the body gets more vitamin C than it needs, some of the vitamin C breaks down into oxalate. The oxalate then goes to the kidneys and into the urine. You do not need to limit fruits and vegetables with vitamin C.  4. Increase the amount of oxalate-eating bacteria in your digestive tract.    WHY? If you've ever been on an antibiotic, especially recently, or if you've had part of your digestive tract removed, you are at special risk of not having enough good bacteria (also known as probiotics). Many different bacteria eat oxalate, which reduces the amount of oxalate that is absorbed and reduces oxalate in urine. Eating foods with live cultures, such as yogurt and kefir, may help, but you may need a large dose of probiotics. Start a daily probiotic supplement. Choose one with at least 3, preferably more, different strains of bacteria to increase the chance that some of them will lower the level of oxalate.    In certain cases, vitamin B-6 and/or fish oil supplements  are recommended.       ___ Reduce urine acidity (especially relevant for those who form uric acid or cystine stones).    Sometimes, only medication can address acidic urine effectively. But you can decrease the acidity of your urine by increasing your intake of fruits and vegetables, which provide potassium and other compounds that alkalinize your urine. You can also reduce the amount of foods you eat that contribute directly to acidity, and these include cheese, meats, fish, and poultry.    ___ Reduce urine uric acid: 2 ways.    1. All meats of all types, including poultry and fish, contribute to uric acid production in the body. Eat fewer of these foods within a week and/or eat smaller portions. Organ meats, water fowl, game meats, and certain types of seafood (anchovies, sardines, herring) are especially high in purines. When purines are consumed, they increase uric acid levels. Here are some tips for reducing purines:    a. Make 2 or more days a week "non-meat" days (you may use dairy and eat non-meat protein foods like beans).  b. Limit yourself to one serving per day of meat, fish, poultry or seafood.  c. Limit portion sizes of meat, fish, poultry, or seafood to no more than onequarter of your plate (or 3 to 4 ounces by weight).    2. There are other potential contributors to high urine uric acid. If applicable and depending on your current intake, you may be advised to reduce your consumption of alcohol and/or of fructose, a carbohydrate found naturally in moderate amounts in fruits but used in large amounts in processed foods.            ___ Increase urine citrate.    If your citrate is very low, a medication may be prescribed to help increase it. Otherwise,  and in addition to medication, eating more fruits and vegetables--at least 5 a day--and choosing beverages high in citric acid can increase your urine citrate. Pure lemon juice (not lemonade) and lime juice are very rich in citric  acid. You may try using 2 ounces of lemon or lime juice diluted in water or other beverage twice daily. Beverages with citric acid in them include some  diet sodas and powdered drink mixes.      ___ Increase urine magnesium.    If your urine magnesium is low, you are advised to take a magnesium supplement, which is available over-the-counter. Your registered dietitian will specify how much magnesium you need; you may require a formulation providing in the range of 300 to 500 milligrams per day, depending on your current intake of foods providing magnesium.

## 2019-12-15 NOTE — Plan of Care (Signed)
  Problem: Education: Goal: Knowledge of General Education information will improve Description: Including pain rating scale, medication(s)/side effects and non-pharmacologic comfort measures Outcome: Progressing   Problem: Health Behavior/Discharge Planning: Goal: Ability to manage health-related needs will improve Outcome: Progressing   Problem: Clinical Measurements: Goal: Ability to maintain clinical measurements within normal limits will improve Outcome: Progressing   Problem: Activity: Goal: Risk for activity intolerance will decrease Outcome: Progressing   Problem: Nutrition: Goal: Adequate nutrition will be maintained Outcome: Progressing   Problem: Elimination: Goal: Will not experience complications related to urinary retention Outcome: Progressing   Problem: Pain Managment: Goal: General experience of comfort will improve Outcome: Progressing   Problem: Safety: Goal: Ability to remain free from injury will improve Outcome: Progressing   Problem: Skin Integrity: Goal: Risk for impaired skin integrity will decrease Outcome: Progressing   

## 2019-12-15 NOTE — Hospital Course (Addendum)
7 d course Keflex 1g TID

## 2019-12-15 NOTE — Discharge Summary (Signed)
[facility] Discharge Summary  Patient name: [name] Medical record number: [id number] Date of birth: January 17, 1961 Age: 59 y.o. Gender: female Date of Admission: 12/12/2019  Date of Discharge: 12/15/19 Admitting Physician: Concepcion Living, MD  Primary Care Provider: Gladys Damme, MD Consultants: None  Indication for Hospitalization: Sepsis secondary to urinary source     Discharge Diagnoses/Problem List:  E. coli bacteremia Pyelonephritis Type 2 diabetes   Disposition: Home  Discharge Condition: Stable, improving  Discharge Exam:  GEN: Sitting upright on the edge of the bed playing with her phone CV: Regular rate and rhythm no murmurs appreciated RESP: Occasional rhonchi, no increased work of breathing ABD: Right CVA tenderness, no guarding, no rebound, soft, nontender MSK: Nontender, normal extremity edema SKIN: warm, dry, good skin turgor Taken from Dr. Berlin Hun progress note on day of discharge  Brief Hospital Course:   Sepsis secondary to urinary source  Pyelonephritis  E. coli bacteremia [name] is a 59 y.o. female with history of recurrent kidney stones admitted for worsening right lower back and right flank pain.    Patient presented with fever, chills and worsening right-sided pain. Patient presented to the emergency department and was code sepsis.  Patient was tachycardic and tachypneic on arrival.  WBC was 30.9.  Patient was also hyperglycemic at 450.  She had an AKI with elevated creatinine of 1.58.  Lactic acid was elevated at 3.1.  She was given 2 L of normal saline. CT abdomen significant for bilateral nephrolithiasis with mild right-sided hydronephrosis and perinephric inflammation.  No obstructing ureteral stones visualized and the findings were suggestive of recently passed stone.  Patient started on ceftriaxone. Urine culture and blood cultures indicated E. Coli. On 12/13/2019, patient became febrile and  repeat blood and urine cultures were obtained.  She was transitioned to Ancef per ID pharmacy recommendations.  Vital signs remained stable.  Repeat blood cultures were negative at 24 hours.  Urine cultures had insignificant growth.  Patient to complete 7-day course 1 gram TID Keflex outpatient.   Type 2 diabetes A1c obtained this admission 10.3.  Blood sugars monitored and ranged from 450-108 throughout admission.  Patient's insulin was titrated up (see med list below).     Issues for Follow Up:  1. Patient to complete 7-day course Keflex on 12/22/2019.  Recommend urology referral.  2. Continue to titrate insulin as needed.  Consider addition of GLP-1 or SGLT2.  Significant Procedures:   Significant Labs and Imaging:  Recent Labs  Lab 12/13/19 0327 12/14/19 0919 12/15/19 0515  WBC 33.9* 16.2* 12.3*  HGB 14.2 12.8 12.5  HCT 42.9 38.0 37.4  PLT 240 207 235   Recent Labs  Lab 12/12/19 0448 12/12/19 0448 12/12/19 1312 12/13/19 0327 12/13/19 0327 12/14/19 0801 12/15/19 0515  NA 136  --   --  136  --  132* 134*  K 4.0   < >  --  4.1   < > 3.8 3.4*  CL 102  --   --  105  --  100 103  CO2 20*  --   --  21*  --  23 22  GLUCOSE 450*  --   --  264*  --  302* 108*  BUN 22*  --   --  19  --  17 21*  CREATININE 1.58*  --  1.69* 1.58*  --  1.45* 1.30*  CALCIUM 9.2  --   --  8.9  --  8.5* 8.5*   < > =  values in this interval not displayed.    CT ABDOMEN PELVIS W CONTRAST  Result Date: 12/12/2019 CLINICAL DATA:  Right low back pain with dysuria since last night. Possible pyelonephritis. EXAM: CT ABDOMEN AND PELVIS WITH CONTRAST TECHNIQUE: Multidetector CT imaging of the abdomen and pelvis was performed using the standard protocol following bolus administration of intravenous contrast. CONTRAST:  17mL OMNIPAQUE IOHEXOL 300 MG/ML  SOLN COMPARISON:  01/08/2018 FINDINGS: Lower chest: Lung bases are clear. Minimal calcified plaque over the descending thoracic aorta. Hepatobiliary: Mild  diffuse low-attenuation of the liver compatible with a degree of steatosis without focal mass. Previous cholecystectomy. Biliary tree is unremarkable. Pancreas: Normal. Spleen: Normal. Adrenals/Urinary Tract: Adrenal glands are normal. Kidneys are normal size there are patchy areas of left renal cortical scarring. Several nonobstructing left renal stones. There is delayed excretion by the right kidney. There is mild right-sided hydronephrosis with stranding/fluid in the right perinephric fat. Several right renal stones are present. 1.5 cm oval right midpole cortical hypodensity which may represent distended calyx, cyst or much less likely pyelonephritis. Ureters are normal in caliber without evidence of stones. Bladder is unremarkable. Stomach/Bowel: Stomach and small bowel are normal. Appendix is normal. Minimal diverticulosis of the colon. Vascular/Lymphatic: Mild-to-moderate calcified plaque over the abdominal aorta which is normal in caliber. Few small periaortic lymph nodes are present without significant change. Reproductive: Uterus just right of midline. Right ovary unremarkable. Left ovary not well visualized. Other: Surgical clip over the left pelvis. Musculoskeletal: Mild degenerative change of the spine. IMPRESSION: 1. Bilateral nephrolithiasis. Mild right-sided hydronephrosis with perinephric inflammation/fluid. No obstructing ureteral stones visualized. Findings may be due to recent passage of a stone and less likely pyelonephritis/infection. 2.  Mild colonic diverticulosis. 3.  Mild hepatic steatosis without focal mass. 4.  Aortic Atherosclerosis (ICD10-I70.0). Electronically Signed   By: Elberta Fortis M.D.   On: 12/12/2019 10:27    Results/Tests Pending at Time of Discharge:   Discharge Medications:  Allergies as of 12/15/2019      Reactions   Tamsulosin Shortness Of Breath      Medication List    STOP taking these medications   HYDROcodone-acetaminophen 5-325 MG tablet Commonly known  as: NORCO/VICODIN   levofloxacin 500 MG tablet Commonly known as: Levaquin   ondansetron 4 MG tablet Commonly known as: ZOFRAN     TAKE these medications   acetaminophen 500 MG tablet Commonly known as: TYLENOL Take 1,000 mg by mouth every 6 (six) hours as needed for mild pain.   aspirin 81 MG EC tablet Take 81 mg by mouth daily.   calcium carbonate 500 MG chewable tablet Commonly known as: TUMS - dosed in mg elemental calcium Chew 1-2 tablets by mouth as needed for indigestion or heartburn.   cephALEXin 500 MG capsule Commonly known as: KEFLEX Take 2 capsules (1,000 mg total) by mouth 3 (three) times daily for 7 days.   fluconazole 150 MG tablet Commonly known as: Diflucan Take 1 tablet (150 mg total) by mouth daily.   Gas-X Extra Strength 125 MG chewable tablet Generic drug: simethicone Chew 125 mg by mouth every 6 (six) hours as needed for flatulence.   hydrocortisone cream 0.5 % Apply 1 application topically 2 (two) times daily. What changed:   when to take this  reasons to take this   insulin NPH Human 100 UNIT/ML injection Commonly known as: NOVOLIN N Inject 14 units every morning with meal and 10 units every evening with meal. What changed:   how much to take  how to take this  when to take this  additional instructions   insulin regular 100 units/mL injection Commonly known as: HumuLIN R Inject 0.1 mLs (10 Units total) into the skin 2 (two) times daily before a meal.   multivitamin with minerals Tabs tablet Take 1 tablet by mouth daily.   Pepto-Bismol 262 MG Tabs Generic drug: Bismuth Subsalicylate Take 1-2 tablets by mouth every 6 (six) hours as needed (for gas or "pressure").       Discharge Instructions: Please refer to Patient Instructions section of EMR for full details.  Patient was counseled important signs and symptoms that should prompt return to medical care, changes in medications, dietary instructions, activity restrictions, and  follow up appointments.   Follow-Up Appointments:  Follow-up Information    Shirlean Mylar, MD. Go on 01/01/2020.   Specialty: Family Medicine Why: 8:55 AM  Contact information: [address] [place] [address] [phone]        Shirley, [place], DO. Go on 12/21/2019.   Specialty: Family Medicine Why: 9:50 AM Contact information: [address] [place] [address] [phone]               Katha Cabal, DO 12/15/2019, 3:01 PM PGY-1, [facility]

## 2019-12-15 NOTE — Progress Notes (Addendum)
Family Medicine Teaching Service Daily Progress Note Intern Pager: [phone]  Patient name: [name] Medical record number: [id number] Date of birth: 02/10/1961 Age: 59 y.o. Gender: female  Primary Care Provider: Shirlean Mylar, MD Consultants: None Code Status: Full  Pt Overview and Major Events to Date:  12/12/2019 admitted for right-sided low back pain, dysuria  Assessment and Plan: [name] is a 59 y.o. female presenting with right-sided low back pain with dysuria. PMH is significant for recurrent kidney stones, T2DM, HTN, CHF, HLD, history of urosepsis.  Sepsis  E coli Bacteremia  Patient had an elevated temperature of 99.9 yesterday.  Leukocytosis downtrending 12.3 today from 16.3 yesterday.  Repeat blood culture no growth at 24 hours. Urine culture with <10k colonies deemed insignificant.   Transition to oral Abx today. Patient stable for discharge with PCP follow up. -Consider ID and urology consult -Discontinue ceftriaxone (6/12-) -Continue Ancef (6/14 - )  -Consider transitioning to Keflex  -Follow-up blood culture: No growth to date -Pain control with Tylenol 650 every 8 hours, oxycodone 5 q6 hours -AM CBC, BMP -PT/OT eval and treat -Dietitian discussed options for decreasing occurrence of kidney stones   DMT2 Fasting CBG 183.  Patient received 22 units NPH and 22 units short acting insulin (3 units 3 times daily with meals).  HbA1c 10.9% on admission. Home medication includes insulin 10 units long-acting twice daily and 12 units short acting twice daily with meals.  -Monitor CBGs -12 units NPH twice daily, consider increasing to 14 units twice daily --Continue meal time coverage 3 units  -Moderate sensitivity sliding scale -Consider addition of GLP-1 or SGLT 2  CHF Stable. -ECHO outpatient . -Monitor fluid status  COPD Stable. Smokes 1 pack a day at this time.  Reported hx of COPD but no controlled medications. No PFTs visible on chart  review.  -Monitor respiratory status -Consider initiating controller medication - PFTs outpatient   History of bipolar disorder  depression Stable. No home medications  -Monitor mood and for signs of depression  FEN/GI: Heart healthy, carb modified Prophylaxis: Lovenox  Disposition: home today   Subjective:  Planes of right flank pain though has improved from her baseline.  Like to go home if possible.  Objective: Temp:  [98.2 F (36.8 C)-99.9 F (37.7 C)] 98.2 F (36.8 C) (06/15 0421) Pulse Rate:  [69-91] 87 (06/15 0421) Resp:  [15-19] 16 (06/15 0421) BP: (111-130)/(44-63) 111/44 (06/15 0421) SpO2:  [93 %-97 %] 93 % (06/15 0421)  Physical Exam: GEN: Sitting upright on the edge of the bed playing with her phone CV: Regular rate and rhythm no murmurs appreciated RESP: Occasional rhonchi, no increased work of breathing ABD: Right CVA tenderness, no guarding, no rebound, soft, nontender MSK: Nontender, normal extremity edema SKIN: warm, dry, good skin turgor    Laboratory: Recent Labs  Lab 12/13/19 0327 12/14/19 0919 12/15/19 0515  WBC 33.9* 16.2* 12.3*  HGB 14.2 12.8 12.5  HCT 42.9 38.0 37.4  PLT 240 207 235   Recent Labs  Lab 12/13/19 0327 12/14/19 0801 12/15/19 0515  NA 136 132* 134*  K 4.1 3.8 3.4*  CL 105 100 103  CO2 21* 23 22  BUN 19 17 21*  CREATININE 1.58* 1.45* 1.30*  CALCIUM 8.9 8.5* 8.5*  GLUCOSE 264* 302* 108*   Blood culture: E. coli, sensitivities to follow  Imaging/Diagnostic Tests: CT ABDOMEN PELVIS W CONTRAST (12/12/19):  1. Bilateral nephrolithiasis. Mild right-sided hydronephrosis with perinephric inflammation/fluid. No obstructing ureteral stones visualized. Findings may  be due to recent passage of a stone and less likely pyelonephritis/infection.  2.  Mild colonic diverticulosis.  3.  Mild hepatic steatosis without focal mass.  4.  Aortic Atherosclerosis (ICD10-I70.0).   No results found.  Lyndee Hensen, DO 12/15/2019,  9:48 AM PGY-1, [place] Intern pager: [phone], text pages welcome

## 2019-12-15 NOTE — Care Management (Signed)
Entered patient in [facility] with no co pay. [facility] Pharm[facility] will bring medications to patient's room prior to discharge.  Ronny Flurry RN

## 2019-12-19 LAB — CULTURE, BLOOD (ROUTINE X 2)
Culture: NO GROWTH
Culture: NO GROWTH

## 2019-12-21 ENCOUNTER — Encounter: Payer: Self-pay | Admitting: Family Medicine

## 2019-12-21 ENCOUNTER — Other Ambulatory Visit: Payer: Self-pay

## 2019-12-21 ENCOUNTER — Ambulatory Visit (INDEPENDENT_AMBULATORY_CARE_PROVIDER_SITE_OTHER): Payer: Self-pay | Admitting: Family Medicine

## 2019-12-21 VITALS — BP 128/58 | HR 75 | Ht 64.0 in | Wt 264.2 lb

## 2019-12-21 DIAGNOSIS — E118 Type 2 diabetes mellitus with unspecified complications: Secondary | ICD-10-CM

## 2019-12-21 DIAGNOSIS — N200 Calculus of kidney: Secondary | ICD-10-CM

## 2019-12-21 DIAGNOSIS — R7881 Bacteremia: Secondary | ICD-10-CM

## 2019-12-21 DIAGNOSIS — B9620 Unspecified Escherichia coli [E. coli] as the cause of diseases classified elsewhere: Secondary | ICD-10-CM

## 2019-12-21 MED ORDER — METFORMIN HCL 500 MG PO TABS: 500.0000 mg | ORAL_TABLET | Freq: Two times a day (BID) | ORAL | 2 refills | Status: DC

## 2019-12-21 NOTE — Patient Instructions (Addendum)
Thank you for coming to see me today. It was a pleasure! Today we talked about:   I am glad that you are feeling so much better after your recent hospitalizations.  I have placed a referral to urology.  If you have not heard from them within 2 weeks then please let our office know.  For your diabetes, we will restart your Metformin.  I recommend starting by taking 500 mg twice daily with food.  If you develop any symptoms of diarrhea or abdominal pain then taking this with a high-protein meal such as meat or nuts helps with the side effects.  Please be sure to follow-up in our clinic in 2 weeks to discuss other medication options.  Please stop by upfront to speak with [name] in order to discuss obtaining the orange card to help you afford her medications.  We would like to discuss the option of starting a medication called Jardiance in the future in order to help with renal protection.  Please discuss this with Dr. Leary Roca at your follow-up.  Please follow-up with our clinic as needed and urology as soon as possible.  If you have any questions or concerns, please do not hesitate to call the office at [phone].  Take Care,   Holly Yazaira Speas, DO

## 2019-12-21 NOTE — Progress Notes (Signed)
   SUBJECTIVE:   CHIEF COMPLAINT / HPI:   Hospital follow-up for sepsis secondary to UTI with history of renal stones: Referral to urology placed given patient's prior history with recurrent stones and now recent hospitalization with sepsis due to stones. She reports since leaving the hospital she has not had any further fevers and her back pain has improved. She states that overall she is feeling much better. She reports that she has one more day left of her antibiotics in order to complete the course. She denies any dysuria, blood in her urine, abdominal pain.  Diabetes: Last A1c in the hospital elevated to 10.9. Medications: NPH 14 units in the morning and 10 units at night. Patient reports that she also has 10 units of Humalog with each meal which is twice daily. She has previously been on Metformin and been able to tolerate the medication but has not been on it recently. She is amenable to restarting this medication. Compliance: yes Hypoglycemic symptoms: no On Aspirin, and on statin ROS: denies dizziness, diaphoresis, LOC, polyuria, polydipsia      Patient reports that she does not have insurance at this time and would like to discuss with [place] regarding obtaining orange card.  PERTINENT  PMH / PSH: T2DM, tobacco use disorder, recurrent kidney stones, depression, bipolar, HTN, HLD, CKD stage II, CHF, COPD  OBJECTIVE:  BP (!) 128/58   Pulse 75   Ht 5\' 4"  (1.626 m)   Wt 264 lb 4 oz (119.9 kg)   SpO2 97%   BMI 45.36 kg/m   General: NAD, pleasant Neck: Supple Respiratory:  normal work of breathing Gastrointestinal: soft, nontender, nondistended, no CVA tenderness Psych: AOx3, appropriate affect  ASSESSMENT/PLAN:   Recurrent kidney stones Referral placed to urology  Type 2 diabetes mellitus with complication (HCC) Restart of Metformin given patient's stable renal function and worsening A1c. Previously has tolerated. We will start with 500 mg twice daily with meals. Patient to  continue her NPH 14 units in the a.m. and 10 units at night as well as her 10 units of Humalog with meals. Patient to reach out to get orange card in order to start medication such as SGLT2 which will help with the patient's renal protection. She is to follow-up with PCP for discussion of this further.   Patient encouraged to speak to our office about obtaining the orange card in order to better for medications such as Jardiance which would be beneficial to patient for her diabetes.  Gregoire Bennis, DO PGY-3, [facility]

## 2019-12-24 NOTE — Assessment & Plan Note (Signed)
Referral placed to urology.

## 2019-12-24 NOTE — Assessment & Plan Note (Signed)
Restart of Metformin given patient's stable renal function and worsening A1c. Previously has tolerated. We will start with 500 mg twice daily with meals. Patient to continue her NPH 14 units in the a.m. and 10 units at night as well as her 10 units of Humalog with meals. Patient to reach out to get orange card in order to start medication such as SGLT2 which will help with the patient's renal protection. She is to follow-up with PCP for discussion of this further.

## 2020-01-01 ENCOUNTER — Ambulatory Visit: Payer: Self-pay | Admitting: Family Medicine

## 2020-01-19 ENCOUNTER — Ambulatory Visit (INDEPENDENT_AMBULATORY_CARE_PROVIDER_SITE_OTHER): Payer: Self-pay | Admitting: Family Medicine

## 2020-01-19 ENCOUNTER — Other Ambulatory Visit: Payer: Self-pay

## 2020-01-19 ENCOUNTER — Encounter: Payer: Self-pay | Admitting: Family Medicine

## 2020-01-19 VITALS — BP 134/82 | HR 98 | Ht 64.0 in | Wt 269.0 lb

## 2020-01-19 DIAGNOSIS — E1129 Type 2 diabetes mellitus with other diabetic kidney complication: Secondary | ICD-10-CM

## 2020-01-19 DIAGNOSIS — Z794 Long term (current) use of insulin: Secondary | ICD-10-CM

## 2020-01-19 DIAGNOSIS — R809 Proteinuria, unspecified: Secondary | ICD-10-CM

## 2020-01-19 DIAGNOSIS — E0865 Diabetes mellitus due to underlying condition with hyperglycemia: Secondary | ICD-10-CM

## 2020-01-19 DIAGNOSIS — Z0000 Encounter for general adult medical examination without abnormal findings: Secondary | ICD-10-CM

## 2020-01-19 DIAGNOSIS — E118 Type 2 diabetes mellitus with unspecified complications: Secondary | ICD-10-CM

## 2020-01-19 NOTE — Patient Instructions (Signed)
It was a pleasure to see you!  1. I will call you regarding your results if anything is abnormal. If everything is good, I will send you a letter with the results.  2. For the orange card: please get the sheet at the front office that has where to send the application (should be email, fax, or mail).  3. For your diabetes, please follow up in 2 months at the end of September. Call in August to schedule this appointment to check on your hemoglobin A1c (diabetes).  Be Well!  Dr. Leary Roca

## 2020-01-20 LAB — BASIC METABOLIC PANEL
BUN/Creatinine Ratio: 14 (ref 9–23)
BUN: 14 mg/dL (ref 6–24)
CO2: 23 mmol/L (ref 20–29)
Calcium: 9.5 mg/dL (ref 8.7–10.2)
Chloride: 104 mmol/L (ref 96–106)
Creatinine, Ser: 1.03 mg/dL — ABNORMAL HIGH (ref 0.57–1.00)
GFR calc Af Amer: 70 mL/min/{1.73_m2} (ref >59)
GFR calc non Af Amer: 60 mL/min/{1.73_m2} (ref >59)
Glucose: 96 mg/dL (ref 65–99)
Potassium: 4.1 mmol/L (ref 3.5–5.2)
Sodium: 142 mmol/L (ref 134–144)

## 2020-01-21 LAB — MICROALBUMIN / CREATININE URINE RATIO
Creatinine, Urine: 39.3 mg/dL
Microalb/Creat Ratio: 50 mg/g creat — ABNORMAL HIGH (ref 0–29)
Microalbumin, Urine: 19.8 ug/mL

## 2020-01-22 ENCOUNTER — Encounter: Payer: Self-pay | Admitting: Family Medicine

## 2020-01-22 DIAGNOSIS — R809 Proteinuria, unspecified: Secondary | ICD-10-CM | POA: Insufficient documentation

## 2020-01-22 DIAGNOSIS — E1129 Type 2 diabetes mellitus with other diabetic kidney complication: Secondary | ICD-10-CM | POA: Insufficient documentation

## 2020-01-22 MED ORDER — ROSUVASTATIN CALCIUM 10 MG PO TABS: 10.0000 mg | ORAL_TABLET | Freq: Every day | ORAL | 3 refills | Status: DC

## 2020-01-22 MED ORDER — LOSARTAN POTASSIUM 25 MG PO TABS: 25.0000 mg | ORAL_TABLET | Freq: Every day | ORAL | 3 refills | Status: DC

## 2020-01-22 NOTE — Assessment & Plan Note (Addendum)
Diabetes: checking BMP today as cr elevated 3 weeks ago, admitted to hospital in June 2021 Current Regimen: Metformin , NPH 14U AM & 10U PM, humalin 10U with meals CBGs: no daily checks Last A1c: 10% on 12/21/19 Denies polyuria, polydipsia, hypoglycemia  Last Eye Exam: several years Statin: ASCVD risk 8.3%, LDL 112; will start crestor 20 mg qd ACE/ARB: Will start ARB now that GFR >60, cozaar 25mg  qd  F/U in 2 months for A1c check, orange card.

## 2020-01-22 NOTE — Assessment & Plan Note (Signed)
Patient does not have insurance. She is currently applying for the orange card. Got patient correct contact information to send in application. Will perform more care gaps this year when orange card available, long term goal. Discussed COVID-19 vaccination at length with patient, who is hesitant. Answered her questions. She is considering doing it within the next few months. Diabetic foot exam performed today.

## 2020-01-22 NOTE — Progress Notes (Signed)
SUBJECTIVE:   CHIEF COMPLAINT / HPI: follow up for DM  Patient is not sure why she is here. She was admitted to hospital in June for nephrolithiasis and UTI, DM not controlled. Last A1c 12/21/19 was 10% No hypoglycemic episodes, not checking BS daily. Attempting to get orange card. Will catch up on care gaps when orange card in place, will be long term goal for this year.  PERTINENT  PMH / PSH: DM  OBJECTIVE:   BP 134/82   Pulse 98   Ht 5\' 4"  (1.626 m)   Wt 269 lb (122 kg)   SpO2 96%   BMI 46.17 kg/m   Physical Exam Vitals reviewed.  Constitutional:      General: She is not in acute distress.    Appearance: Normal appearance. She is obese. She is not ill-appearing, toxic-appearing or diaphoretic.  HENT:     Head: Normocephalic and atraumatic.     Mouth/Throat:     Mouth: Mucous membranes are moist.  Eyes:     Conjunctiva/sclera: Conjunctivae normal.  Cardiovascular:     Rate and Rhythm: Normal rate and regular rhythm.     Pulses: Normal pulses.     Heart sounds: Normal heart sounds. No murmur heard.   Pulmonary:     Effort: Pulmonary effort is normal. No respiratory distress.     Breath sounds: Normal breath sounds. No stridor. No wheezing, rhonchi or rales.  Chest:     Chest wall: No tenderness.  Abdominal:     General: Abdomen is flat. Bowel sounds are normal.     Palpations: Abdomen is soft.  Skin:    General: Skin is warm and dry.     Capillary Refill: Capillary refill takes less than 2 seconds.  Neurological:     General: No focal deficit present.     Mental Status: She is alert and oriented to person, place, and time. Mental status is at baseline.  Psychiatric:        Mood and Affect: Mood normal.        Behavior: Behavior normal.    Diabetic Foot Exam - Simple   Simple Foot Form Diabetic Foot exam was performed with the following findings: Yes 01/19/2020  3:00 PM  Visual Inspection No deformities, no ulcerations, no other skin breakdown  bilaterally: Yes Sensation Testing Intact to touch and monofilament testing bilaterally: Yes Pulse Check Posterior Tibialis and Dorsalis pulse intact bilaterally: Yes Comments Normal exam.    ASSESSMENT/PLAN:   Type 2 diabetes mellitus with complication (HCC) Diabetes: checking BMP today as cr elevated 3 weeks ago, admitted to hospital in June 2021 Current Regimen: Metformin , NPH 14U AM & 10U PM CBGs: no daily checks Last A1c: 10% on 12/21/19 Denies polyuria, polydipsia, hypoglycemia  Last Eye Exam: several years Statin: ASCVD risk 8.3%, LDL 112; will start crestor 20 mg qd ACE/ARB: Will start ARB now that GFR >60, cozaar 25mg  qd    Microalbuminuria due to type 2 diabetes mellitus (HCC) UA microalbuminuria today present at 50. Will start cozaar 25 mg to prevent further kidney damage. Cr improved to 1 today, AKI resolved since admission to hospital in June 2021.  Healthcare maintenance Patient does not have insurance. She is currently applying for the orange card. Got patient correct contact information to send in application. Will perform more care gaps this year when orange card available, long term goal. Discussed COVID-19 vaccination at length with patient, who is hesitant. Answered her questions. She is considering doing it  within the next few months. Diabetic foot exam performed today.      Shirlean Mylar, MD [facility]

## 2020-01-22 NOTE — Assessment & Plan Note (Signed)
UA microalbuminuria today present at 50. Will start cozaar 25 mg to prevent further kidney damage. Cr improved to 1 today, AKI resolved since admission to hospital in June 2021.

## 2020-03-24 ENCOUNTER — Other Ambulatory Visit: Payer: Self-pay

## 2020-03-24 MED ORDER — METFORMIN HCL 500 MG PO TABS: 500.0000 mg | ORAL_TABLET | Freq: Two times a day (BID) | ORAL | 2 refills | Status: DC

## 2020-07-01 ENCOUNTER — Other Ambulatory Visit: Payer: Self-pay | Admitting: Family Medicine

## 2020-07-01 DIAGNOSIS — E0865 Diabetes mellitus due to underlying condition with hyperglycemia: Secondary | ICD-10-CM

## 2020-07-01 DIAGNOSIS — E1129 Type 2 diabetes mellitus with other diabetic kidney complication: Secondary | ICD-10-CM

## 2020-07-04 NOTE — Telephone Encounter (Signed)
Patient needs follow up for diabetes and TN. Will refill meds for one month. Will have blue team contact to have them schedule an appointment.  Shirlean Mylar, MD [facility], PGY-2

## 2020-07-05 NOTE — Telephone Encounter (Signed)
Attempted to reach pt. No answer. LVM for pt to call the office to make appt for follow up on BP, diabetes and for any future med refills that may be needed. Aquilla Solian, CMA

## 2020-07-13 NOTE — Telephone Encounter (Signed)
2nd attempted to reach pt. To make appt for f/up for diabetes, BP and any future med refills. No answer. LVM for pt to call the office to make that appt. Aquilla Solian, CMA

## 2020-07-19 NOTE — Telephone Encounter (Signed)
Pt has appointment scheduled with PCP on 07/22/2020. Jaton Eilers Zimmerman Rumple, CMA

## 2020-07-22 ENCOUNTER — Other Ambulatory Visit: Payer: Self-pay | Admitting: Family Medicine

## 2020-07-22 ENCOUNTER — Encounter: Payer: Self-pay | Admitting: Family Medicine

## 2020-07-22 ENCOUNTER — Other Ambulatory Visit: Payer: Self-pay

## 2020-07-22 ENCOUNTER — Ambulatory Visit (INDEPENDENT_AMBULATORY_CARE_PROVIDER_SITE_OTHER): Payer: Self-pay | Admitting: Family Medicine

## 2020-07-22 VITALS — BP 146/64 | HR 90 | Ht 64.0 in | Wt 277.2 lb

## 2020-07-22 DIAGNOSIS — E782 Mixed hyperlipidemia: Secondary | ICD-10-CM

## 2020-07-22 DIAGNOSIS — Z1231 Encounter for screening mammogram for malignant neoplasm of breast: Secondary | ICD-10-CM

## 2020-07-22 DIAGNOSIS — I10 Essential (primary) hypertension: Secondary | ICD-10-CM

## 2020-07-22 DIAGNOSIS — E1169 Type 2 diabetes mellitus with other specified complication: Secondary | ICD-10-CM

## 2020-07-22 DIAGNOSIS — Z794 Long term (current) use of insulin: Secondary | ICD-10-CM

## 2020-07-22 DIAGNOSIS — E118 Type 2 diabetes mellitus with unspecified complications: Secondary | ICD-10-CM

## 2020-07-22 DIAGNOSIS — E785 Hyperlipidemia, unspecified: Secondary | ICD-10-CM

## 2020-07-22 DIAGNOSIS — Z1159 Encounter for screening for other viral diseases: Secondary | ICD-10-CM

## 2020-07-22 DIAGNOSIS — E0865 Diabetes mellitus due to underlying condition with hyperglycemia: Secondary | ICD-10-CM

## 2020-07-22 DIAGNOSIS — Z0000 Encounter for general adult medical examination without abnormal findings: Secondary | ICD-10-CM

## 2020-07-22 LAB — POCT GLYCOSYLATED HEMOGLOBIN (HGB A1C): Hemoglobin A1C: 9.9 % — AB (ref 4.0–5.6)

## 2020-07-22 MED ORDER — INSULIN GLARGINE 100 UNIT/ML SOLOSTAR PEN: 15.0000 [IU] | PEN_INJECTOR | SUBCUTANEOUS | 11 refills | Status: DC

## 2020-07-22 MED ORDER — METFORMIN HCL 500 MG PO TABS: 1000.0000 mg | ORAL_TABLET | Freq: Two times a day (BID) | ORAL | 2 refills | Status: DC

## 2020-07-22 MED FILL — !LANTUS SOLOSTAR 100UNITS/M: 100 | 20 days supply | Qty: 3 | Fill #0

## 2020-07-22 MED FILL — METFORMIN HCL 500 MG TABS: 500 | 15 days supply | Qty: 60 | Fill #0

## 2020-07-22 NOTE — Patient Instructions (Addendum)
It was a pleasure to see you today!  1. For labs: I recommend you get LDL direct and a complete metabolic panel as these will help me follow your bad cholesterol (risk for heart attack and stroke) as well as your liver and kidneys. If you go across the street to the [facility] on [address] and pay cash up front is about 40% cheaper.  2. For Lantus: stop taking NPH. Pick up lantus from [employer] [facility] [employer] Start taking lantus 15 units tomorrow morning (Saturday), and 15 units on Sunday morning. Check your fasting blood sugar daily and write it down. The pharmacy team will call you to help titrate your insulin on Monday. Every day that your fasting blood sugar is above 200, add on 2 units of Lantus.  3. Metformin: increase metformin to two pills of 500 mg (total of 1000 mg) in the morning and at night for a daily total of 2000mg .   4. Orange Card: please fill this out so we can get your other screenings done. If you have trouble printing or copying important information, let know! We can help you!  5. Follow up with me in 1 month: February 22 at 3:50 PM   Be Well,  Dr. February 24

## 2020-07-22 NOTE — Progress Notes (Signed)
SUBJECTIVE:   CHIEF COMPLAINT / HPI: DM f/u  DM: patient reports BG 130-200 daily, takes NPH 14U AM and 10U PM due to no insurance, also on metformin 500 mg BID. A1c not at goal: last 10.9% in June 2021, repeat today at 9.9%.   HTN: Patient not at goal of 146/64 today, however, worried about medication changes and cost. She reports that she sometimes checks BP at home and it is <130/80. Patient has automatic cuff at home. She is asymptomatic. Patient is on losartan.  HLD: patient on rosuvastatin 10 mg. Would like to get LDL and CMP to evaluate LFTs, however, due to insurance situation, recommend patient get LDL and CMP at [facility] with cash pay options that reduce overall cost, directions given.  No Insurance: patient reports that she has not applied for orange card yet as she had no way of making copies of required documents, however she recently got a printer/copier at home and believes she can get the required copies of documents now. If patient cannot make copies at home, offered for her to bring documents to office and we can do them for her.  Health Maintenance: patient is due for colonoscopy and mammogram, but cannot afford at the moment due to no insurance. Will help patient with copy situation so she can apply for orange card. Patient wants to wait at this time on TDAP due to cost.   PERTINENT  PMH / PSH: DMT2, HTN  OBJECTIVE:   BP (!) 146/64   Pulse 90   Ht 5\' 4"  (1.626 m)   Wt 277 lb 4 oz (125.8 kg)   SpO2 97%   BMI 47.59 kg/m   Nursing note and vitals reviewed GEN: middle-aged, WW resting comfortably in chair, NAD, obese HEENT: NCAT. Sclera without injection or icterus.  Cardiac: Regular rate and rhythm. Normal S1/S2. No murmurs, rubs, or gallops appreciated. 2+ radial pulses. Lungs: Clear bilaterally to ascultation. No increased WOB, no accessory muscle usage. No w/r/r. Neuro: Alert and at baseline Ext: no edema Psych: Pleasant and appropriate   ASSESSMENT/PLAN:    Type 2 diabetes mellitus with complication (HCC) Diabetes Current Regimen: NPH 14U AM 10U PM, metformin 500 mg BID CBGs: 130-200s  Last A1c: 9.9% on today  Denies polyuria, polydipsia, hypoglycemia  Last Eye Exam: not sure Statin: rosuvastatin 10mg  ACE/ARB: losartan Patient with uncontrolled DMT2: increase metformin to 1000mg  BID, discontinue NPH, start Lantus 15U qAM on Saturday and Sunday. Discussed with Pharmacist, Dr. , they will call Monday to help patient titrate lantus, will increase by 2-5U if BG >200 thereafter. Once patient is titrated appropriately, plan to start patient on GLP1    HTN (hypertension) TN: close to goal <130/80. Patient reports it is lower at home. Patient has BP cuff. Recommend she take BP at home at the same time every evening, uncrossed legs, and write down BP for 1 week. Patient to send MyChart message with BP's at home for best measurements. Presently on Losartan 25 mg.  Hyperlipidemia Patient on rosuvastatin 10 mg. Would like CMP for LFTs as well as LDL direct to evaluate control of HLD, however due to cost, patient will take orders to lab for more affordable cost.   Healthcare maintenance Patient does not have insurance, discussed orange card, will help patient make copies if needed. Will await mammogram and colonoscopy for orange card process.  COVID-19 booster given today. Reminded to see ophtho when able Pap smear due this year, will wait for orange card Hep C  screening is due, but due to cost, will wait as overall low risk    Shirlean Mylar, MD [facility]

## 2020-07-24 NOTE — Assessment & Plan Note (Signed)
TN: close to goal <130/80. Patient reports it is lower at home. Patient has BP cuff. Recommend she take BP at home at the same time every evening, uncrossed legs, and write down BP for 1 week. Patient to send MyChart message with BP's at home for best measurements. Presently on Losartan 25 mg.

## 2020-07-24 NOTE — Assessment & Plan Note (Signed)
Patient on rosuvastatin 10 mg. Would like CMP for LFTs as well as LDL direct to evaluate control of HLD, however due to cost, patient will take orders to lab for more affordable cost.

## 2020-07-24 NOTE — Assessment & Plan Note (Signed)
Diabetes Current Regimen: NPH 14U AM 10U PM, metformin 500 mg BID CBGs: 130-200s  Last A1c: 9.9% on today  Denies polyuria, polydipsia, hypoglycemia  Last Eye Exam: not sure Statin: rosuvastatin 10mg  ACE/ARB: losartan Patient with uncontrolled DMT2: increase metformin to 1000mg  BID, discontinue NPH, start Lantus 15U qAM on Saturday and Sunday. Discussed with Pharmacist, Dr. Wednesday, they will call Monday to help patient titrate lantus, will increase by 2-5U if BG >200 thereafter. Once patient is titrated appropriately, plan to start patient on GLP1

## 2020-07-24 NOTE — Assessment & Plan Note (Signed)
Patient does not have insurance, discussed orange card, will help patient make copies if needed. Will await mammogram and colonoscopy for orange card process.  COVID-19 booster given today. Reminded to see ophtho when able Pap smear due this year, will wait for orange card Hep C screening is due, but due to cost, will wait as overall low risk

## 2020-07-27 ENCOUNTER — Encounter: Payer: Self-pay | Admitting: Family Medicine

## 2020-07-28 ENCOUNTER — Telehealth: Payer: Self-pay | Admitting: Pharmacist

## 2020-07-28 ENCOUNTER — Telehealth: Payer: Self-pay

## 2020-07-28 DIAGNOSIS — E118 Type 2 diabetes mellitus with unspecified complications: Secondary | ICD-10-CM

## 2020-07-28 MED ORDER — INSULIN GLARGINE 100 UNIT/ML SOLOSTAR PEN: 18.0000 [IU] | PEN_INJECTOR | SUBCUTANEOUS | Status: DC

## 2020-07-28 NOTE — Telephone Encounter (Signed)
Attempted to call patient regarding diabetes management per Dr. Leary Roca. Our PGY-1 Pharmacy Resident, Trixie Rude, attempted to call patient on Monday 07/25/20 and left voicemail. As of today patient had not returned call and therefore I attempted to reach out again. I left a HIPAA compliant voicemail providing my direct phone number for patient to return my call at her earliest convenience.

## 2020-07-28 NOTE — Telephone Encounter (Signed)
Spoke with patient to follow-up on recent insulin changes. Patient stated her sugars were still elevated, ranging 180s-260s. She is currently taking insulin glargine (Lantus) 15 units in the morning. She denies any hypoglycemic events or symptoms.   Recommended patient increase her Lantus to 18 units.If she does not see a fasting level in the 150s by Monday, she was instructed to increase her Lantus to 20 units.   Will follow up via phone to assess regimen change.

## 2020-07-28 NOTE — Addendum Note (Signed)
Addended by: Kathrin Ruddy on: 07/28/2020 02:07 PM   Modules accepted: Orders

## 2020-07-28 NOTE — Telephone Encounter (Signed)
Noted and agree. 

## 2020-07-28 NOTE — Telephone Encounter (Signed)
I oversaw phone call conducted and patient education on insulin dose change per pharmacy resident Trixie Rude.  Lantus dose increased from 15 to 18units each morning.   Also discussed that if fasting remains > 150 in 4 days, she should increase to 20 units once daily of Lantus.    Phone call follow-up in 5-7 days.

## 2020-08-03 ENCOUNTER — Telehealth: Payer: Self-pay | Admitting: Pharmacist

## 2020-08-03 NOTE — Telephone Encounter (Signed)
Attempted to speak with patient regarding further insulin titration and blood glucose readings. Left HIPAA compliant voicemail asking for a return phone call and provided my direct number.

## 2020-08-04 NOTE — Progress Notes (Signed)
Patient approved for patient assistance with [employer], expiring 07/28/2021. Lantus will be delivered to [facility] for patient to pickup. Will check back with company for shipping status (currently delayed).

## 2020-08-08 ENCOUNTER — Telehealth: Payer: Self-pay | Admitting: Pharmacist

## 2020-08-08 ENCOUNTER — Other Ambulatory Visit: Payer: Self-pay | Admitting: Family Medicine

## 2020-08-08 DIAGNOSIS — Z794 Long term (current) use of insulin: Secondary | ICD-10-CM

## 2020-08-08 DIAGNOSIS — E1129 Type 2 diabetes mellitus with other diabetic kidney complication: Secondary | ICD-10-CM

## 2020-08-08 DIAGNOSIS — E0865 Diabetes mellitus due to underlying condition with hyperglycemia: Secondary | ICD-10-CM

## 2020-08-08 NOTE — Telephone Encounter (Signed)
Attempted to contact patient again regarding further diabetes management. At this time patient has yet to return my phone call and was unable to leave a voicemail today as mailbox was full.

## 2020-08-09 ENCOUNTER — Telehealth: Payer: Self-pay

## 2020-08-09 MED FILL — METFORMIN HCL 500 MG TABS: 500 | 15 days supply | Qty: 60 | Fill #1

## 2020-08-09 MED FILL — !LANTUS SOLOSTAR 100UNITS/M: 100 | 20 days supply | Qty: 3 | Fill #1

## 2020-08-09 NOTE — Telephone Encounter (Signed)
Spoke with pt informed that lantus was ready for pickup. Pt understood. Aquilla Solian, CMA

## 2020-08-09 NOTE — Telephone Encounter (Signed)
-----   Message from Jennette Bill, CMA sent at 08/09/2020 11:44 AM EST ----- Regarding: patient meds Please notify patient her lantus arrived today, she can pick it up.

## 2020-08-10 NOTE — Telephone Encounter (Signed)
Patient's daughter presents to clinic for Lantus. Provided samples per below instructions.   Veronda Prude, RN

## 2020-08-10 NOTE — Telephone Encounter (Signed)
Patient LVM on nurse line returning Rachelles phone call. Will forward to [place].

## 2020-08-11 ENCOUNTER — Telehealth: Payer: Self-pay | Admitting: Pharmacist

## 2020-08-11 NOTE — Telephone Encounter (Cosign Needed)
Patient returned my phone call to discuss diabetes management.  Current regimen:  Lantus 20 units into the skin once daily Metformin 500mg  2 tablets twicedaily  Patient checks blood glucose 1-2x/day.Self reported blood glucose readings: Fasting 189-220 Pre-prandial (before dinner) 180's  Patient denies HYPO and HYPERglycemia. She verbalizes proper symptoms to monitor for. She carries glucose tabs with her in the event of hypoglycemic event.   Instructed patient to increase Lantus by 2 units to 22 units once daily and then if fasting blood glucose over next 3 days is still in the 200's and she has no hypoglycemic events to increase by 2 additional units to 24 units. If she does have hypoglycemic event instructed to go back down by 2 units at next dose and to send Mychart message.   Difficult to contact patient d/t work schedule so she will call me on Monday or Tuesday to follow-up.

## 2020-08-11 NOTE — Telephone Encounter (Signed)
Called pt's daughter back and spoke to her on phone. She will provide my direct call back number to her mother.

## 2020-08-22 NOTE — Progress Notes (Signed)
    SUBJECTIVE:   CHIEF COMPLAINT / HPI:   DMT2: Current medications are Lantus 22U daily, metformin 500 mg 2 tablets BID. Fasting BGs are 180-200s.   Patient still has not obtained insurance, has not applied to orange card. She has a printer that is not hooked up and cannot print the required materials.  Chest pain: patient reports multiple instances of substernal and left chest pain that radiates down the left arm. She gets dyspneic and diaphoretic and has to rest for a few minutes before the pain passes. This occurs mostly on exertion, but sometimes at rest.   PERTINENT  PMH / PSH: DMT2  OBJECTIVE:   BP 132/66   Pulse 98   Ht 5\' 4"  (1.626 m)   Wt 272 lb 4 oz (123.5 kg)   SpO2 97%   BMI 46.73 kg/m   Nursing note and vitals reviewed GEN: age-appropriate WW, resting comfortably in chair, NAD, obese HEENT: NCAT. Sclera without injection or icterus.  Cardiac: Regular rate and rhythm. Normal S1/S2. No murmurs, rubs, or gallops appreciated. 2+ radial pulses. Carotid without bruit. Lungs: Clear bilaterally to ascultation. No increased WOB, no accessory muscle usage. No w/r/r. Neuro: Alert and at baseline Ext: no edema Psych: Pleasant and appropriate  ASSESSMENT/PLAN:   Type 2 diabetes mellitus with complication (HCC) Increase lantus to 25U tomorrow and then continue to increase by 2U every 2 days until patient achieves BGs in the 120-180 range. Will refer patient to CCM for help with BG control.  Angina pectoris [facility]) Patient has history concerning for angina, though not currently experiencing it at this time. Ideally would refer patient to cardiology for stress test, and get echo, however she does not have insurance. She has also not applied for orange card, difficult to obtain the required documents. Offered for patient to bring documents or email to office to facilitate obtaining required documents. Will also refer to CCM for SW assistance in navigating documents. Will give patient  nitrostat 0.4mg  with strict instructions that if chest pain occurs again and she uses sublingual nitro, if cp is not resolved in 5 minutes, she must go to nearest ED. Follow up in one month.     [facility], MD [facility]

## 2020-08-23 ENCOUNTER — Ambulatory Visit (INDEPENDENT_AMBULATORY_CARE_PROVIDER_SITE_OTHER): Payer: Self-pay

## 2020-08-23 ENCOUNTER — Encounter: Payer: Self-pay | Admitting: Family Medicine

## 2020-08-23 ENCOUNTER — Other Ambulatory Visit: Payer: Self-pay

## 2020-08-23 ENCOUNTER — Ambulatory Visit (INDEPENDENT_AMBULATORY_CARE_PROVIDER_SITE_OTHER): Payer: Self-pay | Admitting: Family Medicine

## 2020-08-23 VITALS — BP 132/66 | HR 98 | Ht 64.0 in | Wt 272.2 lb

## 2020-08-23 DIAGNOSIS — I209 Angina pectoris, unspecified: Secondary | ICD-10-CM

## 2020-08-23 DIAGNOSIS — E118 Type 2 diabetes mellitus with unspecified complications: Secondary | ICD-10-CM

## 2020-08-23 DIAGNOSIS — Z23 Encounter for immunization: Secondary | ICD-10-CM

## 2020-08-23 MED ORDER — NITROGLYCERIN 0.4 MG SL SUBL
0.4000 mg | SUBLINGUAL_TABLET | SUBLINGUAL | 12 refills | Status: AC | PRN
Start: 2020-08-23 — End: ?

## 2020-08-23 NOTE — Patient Instructions (Signed)
It was a pleasure to see you today!  1. It is very important to get your documents to apply for the orange card. You can email them to me at [email or web address] and I can have our office print them off for you. I will also have the chronic care management team reach out to help guide you.  2. For chest pain: take 1 nitrostat pill and place it under your tongue. Sit for 5 minutes. If your chest pain does not go away after that, you need to go to the emergency room  3. For diabetes: increase your lantus to 25U. Goal is to get your fasting blood sugar between 120-180. If your blood sugar is above 180 in the morning, increase your lantus by 2U every other day.  4. Follow up on 10/03/20 at 3:50 PM   Be Well,  Dr. Leary Roca   Angina  Angina is discomfort or pain in the chest, neck, arm, jaw, or back. The discomfort is caused by a lack of blood in the middle layer of the heart wall. The middle layer of the heart wall is called the myocardium. What are the causes? This condition is caused by a buildup of fat and cholesterol, or plaque, in your arteries. This buildup narrows the arteries and makes it hard for blood to flow. What increases the risk? Main risks  High levels of cholesterol in your blood.  High blood pressure.  Diabetes.  Family history of heart disease.  Not exercising or moving enough.  Depression.  Having had radiation treatment on the left side of your chest. Other risks  Tobacco use.  Too much body weight (obesity).  A diet that is high in unhealthy fats (saturated fats).  Stress.  Using drugs, such as cocaine. Risks for women  Being older than 55 years.  Being in menopause. This is the time when a woman no longer has a menstrual period. What are the signs or symptoms? Symptoms in all people  Chest pain, which may: ? Feel like a crushing or squeezing in the chest. ? Feel like a tightness, pressure, fullness, or heaviness in the chest. ? Last  for more than a few minutes at a time. ? Stop and come back (recur) after a few minutes.  Pain in the neck, arm, jaw, or back.  Heartburn or upset stomach (indigestion) for no reason.  Being short of breath.  Feeling like you may vomit (nauseous).  Sudden cold sweats. Symptoms in women and people with diabetes  Tiredness.  Worry and anxiety.  Weakness.  Dizziness or fainting. How is this treated? This condition may be treated with:  Medicines. These can be given to prevent blood clots, stop a heart attack, lower blood pressure, or treat other risk factors.  A procedure to widen a narrowed or blocked artery in the heart.  Surgery to allow blood to go around a blocked artery. Follow these instructions at home: Medicines  Take over-the-counter and prescription medicines only as told by your doctor.  Do not take these medicines unless your doctor says that you can: ? NSAIDs. These include:  Ibuprofen.  Naproxen. ? Vitamin supplements that have vitamin A, vitamin E, or both. ? Hormone therapy that contains estrogen with or without progestin. Eating and drinking  Eat a heart-healthy diet that includes: ? Lots of fresh fruits and vegetables. ? Whole grains. ? Low-fat (lean) protein. ? Low-fat dairy products.  Follow instructions from your doctor about what you cannot eat or drink.   Activity  Follow an exercise program that your doctor tells you.  Talk with your doctor about joining a program to make your heart strong again (cardiac rehab).  When you feel tired, take a break. Plan breaks if you know you are going to feel tired. Lifestyle  Do not smoke or use any products that contain nicotine or tobacco. If you need help quitting, ask your doctor.  If your doctor says you can drink alcohol: ? Limit how much you have to:  0-1 drink a day for women who are not pregnant.  0-2 drinks a day for men. ? Know how much alcohol is in your drink. In the [place] one  drink equals one 12 oz bottle of beer (355 mL), one 5 oz glass of wine (148 mL), or one 1 oz glass of hard liquor (44 mL).   General instructions  Stay at a healthy weight. If told to lose weight, work with your doctor to do lose weight safely.  Learn to manage stress. If you need help, ask your doctor.  Keep your vaccines up to date. Get a flu shot every year.  Talk with your doctor if you feel sad. Take a screening test to see if you are at risk for depression.  Work with your doctor to manage any other health problems that you have. These may include diabetes or high blood pressure.  Keep all follow-up visits. Get help right away if:  You have pain in your chest, neck, arm, jaw, or back, and the pain: ? Lasts more than a few minutes. ? Comes back. ? Does not get better after you take medicine under your tongue (sublingual nitroglycerin). ? Keeps getting worse. ? Comes more often.  You have any of these problems: ? Sweating a lot. ? Heartburn or upset stomach. ? Shortness of breath. ? Trouble breathing. ? Feeling like you may vomit. ? Vomiting. ? Feeling more tired than normal. ? Feeling nervous or worrying more than normal. ? Weakness.  You are dizzy or light-headed all of a sudden.  You faint. These symptoms may be an emergency. Get help right away. Call your local emergency services (911 in the [place]  Do not wait to see if the symptoms will go away.  Do not drive yourself to the hospital. Summary  Angina is discomfort or pain in the chest, neck, arm, neck, or back.  Symptoms include chest pain, heartburn or upset stomach, and shortness of breath.  Women or people with diabetes may have other symptoms, such as feeling nervous, being worried, or being weak or tired.  Take all medicines only as told by your doctor.  You should eat a heart-healthy diet and follow an exercise program. This information is not intended to replace advice given to you by your health  care provider. Make sure you discuss any questions you have with your health care provider. Document Revised: 12/11/2019 Document Reviewed: 12/11/2019 Elsevier Patient Education  2021 [employer].

## 2020-08-24 ENCOUNTER — Ambulatory Visit: Payer: Self-pay

## 2020-08-25 ENCOUNTER — Telehealth: Payer: Self-pay | Admitting: *Deleted

## 2020-08-25 NOTE — Chronic Care Management (AMB) (Signed)
  Care Management   Note  08/25/2020 Name: [name] MRN: [id number] DOB: 05/21/1961  [name] is a 60 y.o. year old female who is a primary care patient of Shirlean Mylar, MD. I reached out to [name] by phone today in response to a referral sent by Ms. Isabella Bowens PCP, Shirlean Mylar, MD.   Ms. Vancamp was given information about care management services today including:  1. Care management services include personalized support from designated clinical staff supervised by her physician, including individualized plan of care and coordination with other care providers 2. 24/7 contact phone numbers for assistance for urgent and routine care needs. 3. The patient may stop care management services at any time by phone call to the office staff.  Patient agreed to services and verbal consent obtained.   Follow up plan: Telephone appointment with care management team member scheduled for:08/31/2020  [facility] Guide, Embedded Care Coordination [facility] Management

## 2020-08-25 NOTE — Progress Notes (Incomplete)
    SUBJECTIVE:   CHIEF COMPLAINT / HPI:   DMT2: Current medications are Lantus 22U daily, metformin 500 mg 2 tablets BID. Fasting BGs are 180-200s.   Patient still has not obtained insurance, has not applied to orange card. She has a printer that is not hooked up and cannot print the required materials.  Chest pain: patient reports multiple instances of substernal and left chest pain that radiates down the left arm. She gets dyspneic and diaphoretic and has to rest for a few minutes before the pain passes. This occurs mostly on exertion, but sometimes at rest.   PERTINENT  PMH / PSH: DMT2  OBJECTIVE:   BP 132/66   Pulse 98   Ht 5\' 4"  (1.626 m)   Wt 272 lb 4 oz (123.5 kg)   SpO2 97%   BMI 46.73 kg/m   Nursing note and vitals reviewed GEN: age-appropriate WW, resting comfortably in chair, NAD, obese HEENT: NCAT. Sclera without injection or icterus.  Neck: Supple.  Cardiac: Regular rate and rhythm. Normal S1/S2. No murmurs, rubs, or gallops appreciated. 2+ radial pulses. Lungs: Clear bilaterally to ascultation. No increased WOB, no accessory muscle usage. No w/r/r. Abdomen: Normoactive bowel sounds. No tenderness to deep or light palpation. No rebound or guarding.  ***  Neuro: AOx3 *** Ext: no edema Psych: Pleasant and appropriate     ASSESSMENT/PLAN:   No problem-specific Assessment & Plan notes found for this encounter.     , MD [facility]

## 2020-08-26 ENCOUNTER — Observation Stay (HOSPITAL_BASED_OUTPATIENT_CLINIC_OR_DEPARTMENT_OTHER): Payer: Self-pay

## 2020-08-26 ENCOUNTER — Other Ambulatory Visit: Payer: Self-pay

## 2020-08-26 ENCOUNTER — Emergency Department (HOSPITAL_COMMUNITY): Payer: Self-pay

## 2020-08-26 ENCOUNTER — Encounter (HOSPITAL_COMMUNITY): Payer: Self-pay | Admitting: Pharmacy Technician

## 2020-08-26 ENCOUNTER — Observation Stay (HOSPITAL_COMMUNITY)
Admission: EM | Admit: 2020-08-26 | Discharge: 2020-08-27 | Disposition: A | Discharge status: Home / Self Care | Payer: Self-pay | Attending: Family Medicine | Admitting: Family Medicine

## 2020-08-26 DIAGNOSIS — I209 Angina pectoris, unspecified: Secondary | ICD-10-CM | POA: Insufficient documentation

## 2020-08-26 DIAGNOSIS — Z7982 Long term (current) use of aspirin: Secondary | ICD-10-CM | POA: Insufficient documentation

## 2020-08-26 DIAGNOSIS — E1122 Type 2 diabetes mellitus with diabetic chronic kidney disease: Secondary | ICD-10-CM | POA: Insufficient documentation

## 2020-08-26 DIAGNOSIS — Z79899 Other long term (current) drug therapy: Secondary | ICD-10-CM | POA: Insufficient documentation

## 2020-08-26 DIAGNOSIS — N183 Chronic kidney disease, stage 3 unspecified: Secondary | ICD-10-CM | POA: Insufficient documentation

## 2020-08-26 DIAGNOSIS — R079 Chest pain, unspecified: Principal | ICD-10-CM | POA: Insufficient documentation

## 2020-08-26 DIAGNOSIS — I130 Hypertensive heart and chronic kidney disease with heart failure and stage 1 through stage 4 chronic kidney disease, or unspecified chronic kidney disease: Secondary | ICD-10-CM | POA: Insufficient documentation

## 2020-08-26 DIAGNOSIS — J449 Chronic obstructive pulmonary disease, unspecified: Secondary | ICD-10-CM | POA: Insufficient documentation

## 2020-08-26 DIAGNOSIS — Z794 Long term (current) use of insulin: Secondary | ICD-10-CM | POA: Insufficient documentation

## 2020-08-26 DIAGNOSIS — F17200 Nicotine dependence, unspecified, uncomplicated: Secondary | ICD-10-CM

## 2020-08-26 DIAGNOSIS — Z20822 Contact with and (suspected) exposure to covid-19: Secondary | ICD-10-CM | POA: Insufficient documentation

## 2020-08-26 DIAGNOSIS — Z7984 Long term (current) use of oral hypoglycemic drugs: Secondary | ICD-10-CM | POA: Insufficient documentation

## 2020-08-26 DIAGNOSIS — F17210 Nicotine dependence, cigarettes, uncomplicated: Secondary | ICD-10-CM | POA: Insufficient documentation

## 2020-08-26 DIAGNOSIS — E785 Hyperlipidemia, unspecified: Secondary | ICD-10-CM | POA: Insufficient documentation

## 2020-08-26 DIAGNOSIS — I5030 Unspecified diastolic (congestive) heart failure: Secondary | ICD-10-CM | POA: Insufficient documentation

## 2020-08-26 DIAGNOSIS — I2089 Other forms of angina pectoris: Secondary | ICD-10-CM | POA: Diagnosis present

## 2020-08-26 DIAGNOSIS — I208 Other forms of angina pectoris: Secondary | ICD-10-CM

## 2020-08-26 DIAGNOSIS — E119 Type 2 diabetes mellitus without complications: Secondary | ICD-10-CM

## 2020-08-26 DIAGNOSIS — F319 Bipolar disorder, unspecified: Secondary | ICD-10-CM | POA: Insufficient documentation

## 2020-08-26 LAB — BRAIN NATRIURETIC PEPTIDE: B Natriuretic Peptide: 25.5 pg/mL (ref 0.0–100.0)

## 2020-08-26 LAB — TROPONIN I (HIGH SENSITIVITY)
Troponin I (High Sensitivity): 6 ng/L (ref <18)
Troponin I (High Sensitivity): 6 ng/L (ref <18)

## 2020-08-26 LAB — CBC
HCT: 44.8 % (ref 36.0–46.0)
Hemoglobin: 14.4 g/dL (ref 12.0–15.0)
MCH: 30.6 pg (ref 26.0–34.0)
MCHC: 32.1 g/dL (ref 30.0–36.0)
MCV: 95.3 fL (ref 80.0–100.0)
Platelets: 257 10*3/uL (ref 150–400)
RBC: 4.7 MIL/uL (ref 3.87–5.11)
RDW: 12.4 % (ref 11.5–15.5)
WBC: 9.7 10*3/uL (ref 4.0–10.5)
nRBC: 0 % (ref 0.0–0.2)

## 2020-08-26 LAB — BASIC METABOLIC PANEL
Anion gap: 11 (ref 5–15)
BUN: 11 mg/dL (ref 6–20)
CO2: 20 mmol/L — ABNORMAL LOW (ref 22–32)
Calcium: 8.8 mg/dL — ABNORMAL LOW (ref 8.9–10.3)
Chloride: 105 mmol/L (ref 98–111)
Creatinine, Ser: 0.96 mg/dL (ref 0.44–1.00)
GFR, Estimated: >60 mL/min (ref >60)
Glucose, Bld: 149 mg/dL — ABNORMAL HIGH (ref 70–99)
Potassium: 3.9 mmol/L (ref 3.5–5.1)
Sodium: 136 mmol/L (ref 135–145)

## 2020-08-26 LAB — D-DIMER, QUANTITATIVE: D-Dimer, Quant: 0.29 ug/mL-FEU (ref 0.00–0.50)

## 2020-08-26 LAB — MAGNESIUM: Magnesium: 1.8 mg/dL (ref 1.7–2.4)

## 2020-08-26 LAB — SARS CORONAVIRUS 2 (TAT 6-24 HRS): SARS Coronavirus 2: NEGATIVE

## 2020-08-26 LAB — TSH: TSH: 0.598 u[IU]/mL (ref 0.350–4.500)

## 2020-08-26 LAB — GLUCOSE, CAPILLARY: Glucose-Capillary: 134 mg/dL — ABNORMAL HIGH (ref 70–99)

## 2020-08-26 MED ORDER — PROMETHAZINE HCL 25 MG PO TABS
12.5000 mg | ORAL_TABLET | Freq: Four times a day (QID) | ORAL | Status: DC | PRN
Start: 2020-08-26 — End: 2020-08-27

## 2020-08-26 MED ORDER — INSULIN GLARGINE 100 UNIT/ML ~~LOC~~ SOLN
10.0000 [IU] | Freq: Every day | SUBCUTANEOUS | Status: DC
  Administered 2020-08-27: 10 [IU] via SUBCUTANEOUS
  Filled 2020-08-26: qty 0.1

## 2020-08-26 MED ORDER — ACETAMINOPHEN 650 MG RE SUPP: 650.0000 mg | Freq: Four times a day (QID) | RECTAL | Status: DC | PRN

## 2020-08-26 MED ORDER — ACETAMINOPHEN 325 MG PO TABS: 650.0000 mg | ORAL_TABLET | Freq: Four times a day (QID) | ORAL | Status: DC | PRN

## 2020-08-26 MED ORDER — SODIUM CHLORIDE 0.9% FLUSH
3.0000 mL | Freq: Two times a day (BID) | INTRAVENOUS | Status: DC
  Administered 2020-08-27: 3 mL via INTRAVENOUS

## 2020-08-26 MED ORDER — LOSARTAN POTASSIUM 50 MG PO TABS
50.0000 mg | ORAL_TABLET | Freq: Every morning | ORAL | Status: DC
  Administered 2020-08-27: 50 mg via ORAL
  Filled 2020-08-26: qty 1

## 2020-08-26 MED ORDER — ASPIRIN EC 81 MG PO TBEC
81.0000 mg | DELAYED_RELEASE_TABLET | Freq: Every morning | ORAL | Status: DC
  Administered 2020-08-27: 81 mg via ORAL
  Filled 2020-08-26: qty 1

## 2020-08-26 MED ORDER — POLYETHYLENE GLYCOL 3350 17 G PO PACK: 17.0000 g | PACK | Freq: Every day | ORAL | Status: DC | PRN

## 2020-08-26 MED ORDER — SODIUM CHLORIDE 0.9 % IV SOLN: 250.0000 mL | INTRAVENOUS | Status: DC | PRN

## 2020-08-26 MED ORDER — NICOTINE 21 MG/24HR TD PT24
21.0000 mg | MEDICATED_PATCH | Freq: Every day | TRANSDERMAL | Status: DC
  Administered 2020-08-26 – 2020-08-27 (×2): 21 mg via TRANSDERMAL
  Filled 2020-08-26 (×2): qty 1

## 2020-08-26 MED ORDER — ROSUVASTATIN CALCIUM 5 MG PO TABS
10.0000 mg | ORAL_TABLET | Freq: Every morning | ORAL | Status: DC
  Administered 2020-08-27: 10 mg via ORAL
  Filled 2020-08-26: qty 2

## 2020-08-26 MED ORDER — NITROGLYCERIN 0.4 MG SL SUBL: 0.4000 mg | SUBLINGUAL_TABLET | SUBLINGUAL | Status: DC | PRN

## 2020-08-26 MED ORDER — SODIUM CHLORIDE 0.9% FLUSH: 3.0000 mL | INTRAVENOUS | Status: DC | PRN

## 2020-08-26 MED ORDER — HEPARIN SODIUM (PORCINE) 5000 UNIT/ML IJ SOLN
5000.0000 [IU] | Freq: Three times a day (TID) | INTRAMUSCULAR | Status: DC
  Administered 2020-08-26 – 2020-08-27 (×2): 5000 [IU] via SUBCUTANEOUS
  Filled 2020-08-26 (×2): qty 1

## 2020-08-26 NOTE — Assessment & Plan Note (Signed)
Increase lantus to 25U tomorrow and then continue to increase by 2U every 2 days until patient achieves BGs in the 120-180 range. Will refer patient to CCM for help with BG control.

## 2020-08-26 NOTE — ED Triage Notes (Signed)
Pt here with complaints of sudden onset L sided chest pain and L arm pain. Pt states she took 2 sl Nitro with minimal relief. Pt also took 81mg  asa pta. Pt anxious in triage.

## 2020-08-26 NOTE — Progress Notes (Signed)
  Echocardiogram 2D Echocardiogram has been performed.  [name] 08/26/2020, 5:59 PM

## 2020-08-26 NOTE — ED Provider Notes (Signed)
[facility] EMERGENCY DEPARTMENT Provider Note   CSN: [id number] Arrival date & time: 08/26/20  1151     History Chief Complaint  Patient presents with  . Chest Pain    [name] is a 60 y.o. female.   Chest Pain Pain location:  Substernal area and L chest Pain radiates to:  L shoulder and L jaw Pain severity:  Mild Onset quality:  Gradual Timing:  Intermittent Progression:  Waxing and waning Chronicity:  New Relieved by:  Rest Worsened by:  Exertion Ineffective treatments:  None tried Associated symptoms: diaphoresis, nausea and shortness of breath   Associated symptoms: no back pain, no cough, no fever, no headache, no palpitations and no vomiting   Risk factors: diabetes mellitus, high cholesterol, hypertension, obesity and smoking     HPI: A 60 year old patient with a history of treated diabetes, hypertension, hypercholesterolemia and obesity presents for evaluation of chest pain. Initial onset of pain was approximately 3-6 hours ago. The patient's chest pain is described as heaviness/pressure/tightness, is worse with exertion and is relieved by nitroglycerin. The patient complains of nausea. The patient's chest pain is middle- or left-sided, is not well-localized, is not sharp and does radiate to the arms/jaw/neck. The patient denies diaphoresis. The patient has smoked in the past 90 days. The patient has no history of stroke, has no history of peripheral artery disease and has no relevant family history of coronary artery disease (first degree relative at less than age 59).   Past Medical History:  Diagnosis Date  . BACK PAIN, LOW 08/29/2006  . CHF (congestive heart failure) (HCC)   . DEPRESSION, MAJOR, RECURRENT 08/29/2006  . DISORDER, BIPOLAR NOS 09/09/2006  . DMII (diabetes mellitus, type 2) (HCC) 09/19/2010  . Hyperlipidemia 09/19/2010   LDL 78 on 08/2010. Recheck 08/2011.    Marland Kitchen HYPERTENSION, BENIGN SYSTEMIC 08/29/2006   Well controlled on  Lopressor and lisinopril. Check BMET 12/2011    . Kidney infection   . OVARIAN MASS 09/18/2007   Benign ovarian cyst s/p laparotomy for removal   . TOBACCO DEPENDENCE 08/29/2006   Patient quit in early months of 2012. Continue to follow for possible relapse.    . Urosepsis 09/27/2012    Patient Active Problem List   Diagnosis Date Noted  . Angina pectoris (HCC) 08/26/2020  . Angina at rest [facility]) 08/26/2020  . Microalbuminuria due to type 2 diabetes mellitus (HCC) 01/22/2020  . Bacteremia due to Escherichia coli   . Sepsis (HCC) 12/12/2019  . Hip pain 08/10/2019  . Healthcare maintenance 03/31/2015  . History of CHF (congestive heart failure) 07/16/2013  . COPD (chronic obstructive pulmonary disease) (HCC) 07/16/2013  . Diabetes mellitus type 2 in obese (HCC) 06/11/2013  . Recurrent kidney stones 10/03/2012  . 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 Urinary tract infection 11/12/2011  . Stage 2 chronic kidney disease 11/12/2011  . Morbid obesity (HCC) 11/08/2011  . Knee pain 03/19/2011  . Type 2 diabetes mellitus with complication (HCC) 09/19/2010  . Hyperlipidemia 09/19/2010  . Bipolar affective disorder (HCC) 09/09/2006  . Major depressive disorder, recurrent episode (HCC) 08/29/2006  . TOBACCO DEPENDENCE 08/29/2006  . HTN (hypertension) 08/29/2006  . Chronic venous insufficiency 08/29/2006    Past Surgical History:  Procedure Laterality Date  . PARTIAL HYSTERECTOMY     unsure if had cervix removed.   . stents Bilateral    Kidneys     OB History   No obstetric history on file.     History reviewed. No  pertinent family history.  Social History   Tobacco Use  . Smoking status: Current Every Day Smoker    Packs/day: 1.00    Types: Cigarettes  . Smokeless tobacco: Never Used  Vaping Use  . Vaping Use: Never used  Substance Use Topics  . Alcohol use: Not Currently  . Drug use: No    Home Medications Prior to Admission medications   Medication Sig Start  Date End Date Taking? Authorizing Provider  acetaminophen (TYLENOL) 500 MG tablet Take 1,000 mg by mouth every 6 (six) hours as needed for mild pain.   Yes [provider]  aspirin 81 MG EC tablet Take 81 mg by mouth every morning.   Yes [provider]  hydrocortisone cream 0.5 % Apply 1 application topically 2 (two) times daily. Patient taking differently: Apply 1 application topically 2 (two) times daily as needed for itching (rash). 10/21/17  Yes Latrelle Dodrill, MD  insulin glargine (LANTUS) 100 UNIT/ML Solostar Pen Inject 18-20 Units into the skin every morning. Patient taking differently: Inject 25-27 Units into the skin daily with breakfast. 07/28/20  Yes Kathrin Ruddy, RPH-CPP  losartan (COZAAR) 50 MG tablet Take 1 tablet (50 mg total) by mouth daily. Patient taking differently: Take 50 mg by mouth every morning. 08/09/20  Yes Shirlean Mylar, MD  metFORMIN (GLUCOPHAGE) 500 MG tablet Take 2 tablets (1,000 mg total) by mouth 2 (two) times daily with a meal. 07/22/20  Yes Shirlean Mylar, MD  Multiple Vitamin (MULTIVITAMIN WITH MINERALS) TABS Take 1 tablet by mouth every morning.   Yes [provider]  nitroGLYCERIN (NITROSTAT) 0.4 MG SL tablet Place 1 tablet (0.4 mg total) under the tongue every 5 (five) minutes as needed for chest pain. If after 1 tablet and 5 minutes the chest pain is still present, go to emergency room. 08/23/20  Yes Shirlean Mylar, MD  rosuvastatin (CRESTOR) 10 MG tablet Take 1 tablet (10 mg total) by mouth daily. Patient taking differently: Take 10 mg by mouth every morning. 01/22/20  Yes Shirlean Mylar, MD    Allergies    Tamsulosin  Review of Systems   Review of Systems  Constitutional: Positive for diaphoresis. Negative for chills and fever.  HENT: Negative for congestion and rhinorrhea.   Respiratory: Positive for shortness of breath. Negative for cough.   Cardiovascular: Positive for chest pain. Negative for palpitations.   Gastrointestinal: Positive for nausea. Negative for diarrhea and vomiting.  Genitourinary: Negative for difficulty urinating and dysuria.  Musculoskeletal: Negative for arthralgias and back pain.  Skin: Negative for rash and wound.  Neurological: Negative for light-headedness and headaches.    Physical Exam Updated Vital Signs BP (!) 147/82 (BP Location: Right Wrist)   Pulse 83   Temp 98.3 F (36.8 C) (Oral)   Resp 15   Ht 5\' 4"  (1.626 m)   Wt 122.8 kg   SpO2 95%   BMI 46.47 kg/m   Physical Exam Vitals and nursing note reviewed. Exam conducted with a chaperone present.  Constitutional:      General: She is not in acute distress.    Appearance: Normal appearance.  HENT:     Head: Normocephalic and atraumatic.     Nose: No rhinorrhea.  Eyes:     General:        Right eye: No discharge.        Left eye: No discharge.     Conjunctiva/sclera: Conjunctivae normal.  Cardiovascular:     Rate and Rhythm: Normal rate and  regular rhythm.  Pulmonary:     Effort: Pulmonary effort is normal. No respiratory distress.     Breath sounds: No stridor. No decreased breath sounds or wheezing.  Abdominal:     General: Abdomen is flat. There is no distension.     Palpations: Abdomen is soft.  Musculoskeletal:        General: No tenderness or signs of injury.  Skin:    General: Skin is warm and dry.  Neurological:     General: No focal deficit present.     Mental Status: She is alert. Mental status is at baseline.     Motor: No weakness.  Psychiatric:        Mood and Affect: Mood normal.        Behavior: Behavior normal.     ED Results / Procedures / Treatments   Labs (all labs ordered are listed, but only abnormal results are displayed) Labs Reviewed  BASIC METABOLIC PANEL - Abnormal; Notable for the following components:      Result Value   CO2 20 (*)    Glucose, Bld 149 (*)    Calcium 8.8 (*)    All other components within normal limits  CBC - Abnormal; Notable for the  following components:   WBC 10.7 (*)    All other components within normal limits  COMPREHENSIVE METABOLIC PANEL - Abnormal; Notable for the following components:   Glucose, Bld 104 (*)    Calcium 8.7 (*)    Albumin 3.3 (*)    All other components within normal limits  GLUCOSE, CAPILLARY - Abnormal; Notable for the following components:   Glucose-Capillary 134 (*)    All other components within normal limits  GLUCOSE, CAPILLARY - Abnormal; Notable for the following components:   Glucose-Capillary 131 (*)    All other components within normal limits  GLUCOSE, CAPILLARY - Abnormal; Notable for the following components:   Glucose-Capillary 137 (*)    All other components within normal limits  GLUCOSE, CAPILLARY - Abnormal; Notable for the following components:   Glucose-Capillary 124 (*)    All other components within normal limits  SARS CORONAVIRUS 2 (TAT 6-24 HRS)  CBC  D-DIMER, QUANTITATIVE  TSH  MAGNESIUM  BRAIN NATRIURETIC PEPTIDE  TROPONIN I (HIGH SENSITIVITY)  TROPONIN I (HIGH SENSITIVITY)    EKG EKG Interpretation  Date/Time:  Friday August 26 2020 11:57:32 EST Ventricular Rate:  109 PR Interval:  158 QRS Duration: 74 QT Interval:  368 QTC Calculation: 495 R Axis:   54 Text Interpretation: Sinus tachycardia Otherwise normal ECG Confirmed by Cherlynn Perches ([id number]) on 08/26/2020 1:58:32 PM   Radiology No results found.  Procedures Procedures   Medications Ordered in ED Medications - No data to display  ED Course  I have reviewed the triage vital signs and the nursing notes.  Pertinent labs & imaging results that were available during my care of the patient were reviewed by me and considered in my medical decision making (see chart for details).    MDM Rules/Calculators/A&P HEAR Score: 5                         Chest pain with multiple risk factors, negative laboratory study after my review thus far.  Chest x-ray imaging review shows no acute  cardiopulmonary pathology after radiology review as well.  EKG with no acute ischemic change interval abnormality or arrhythmia.  Patient has multiple risk factors for acute coronary syndrome.  Outpatient follow-up  as well in the question.  I feel that she would benefit from inpatient or observation status for further chest pain rule out.  I contacted the family medicine service who is willing to do so.  Final Clinical Impression(s) / ED Diagnoses Final diagnoses:  Chest pain, unspecified type  Angina at rest [facility](HCC)    Rx / DC Orders ED Discharge Orders         Ordered    Discharge patient        08/27/20 1606           Sabino DonovanKatz, Nikko Goldwire C, MD 08/29/20 1430

## 2020-08-26 NOTE — H&P (Addendum)
Family Medicine Teaching [facility] Admission History and Physical Service Pager: [phone]  Patient name: [name] Medical record number: [id number] Date of birth: January 11, 1961 Age: 60 y.o. Gender: female  Primary Care Provider: Shirlean Mylar, MD Consultants: None Code Status: Full Preferred Emergency Contact: Marcelino Duster daughter [phone]  Chief Complaint: chest pain  Assessment and Plan: [name] is a 60 y.o. female presenting with chest pain. PMH is significant for T2DM, COPD, bipolar affective disorder, angina pectoris, CHF, HTN, HLD, depression, morbid obesity, CKD stage II, tobacco dependence.  Chest pain, ACS rule out  angina pectoris Left-sided pressing chest pain radiating down her arm, worse with exertion, improves with nitro and rest.  Presented to ED after unrelieved by 2 nitro.  Took aspirin 81 mg PTA.  EKG demonstrates sinus tachycardia, no ST elevation.  Troponins negative x2.  BMP demonstrates glucose 149, otherwise unremarkable.  CBC normal.  D-dimer negative. CXR shows streaky opacities of right lung base; favor atelectasis, but infection/aspiration not excluded.  Patient presents on admission with systolics 136-231, diastolics 45-79.  Mostly 150s-160s/70s.  Oxygenating well on room air, normal pulse.  Heart score 5. Differential includes unstable angina, N/STEMI, PE, acute CHF exacerbation, anxiety, GERD, MSK. Not NSTEMI as tropnonin negative x2. Unlikely PE as Well Criteria score 0. Unlikely CHF due to remote history not requiring meds and euvolemic appearance on exam.  Given her significant risk factors, she is at high risk of ACS, heart score 4-5.  We will monitor overnight and plan to consult cardiology in the morning prior to discharge. -Admit to med telemetry with Dr. McDiarmid attending -Vitals per unit routine -Continuous telemetry -Continuous pulse ox -A.m. EKG - TSH, magnesium - ECHO - PT/OT -Consult cardiology in a.m. prior to  discharge due to significant risk factors and typical angina pain  Type 2 diabetes At home, on Lantus 25 units every morning, Metformin 1000 mg twice daily. -Sensitive sliding scale insulin -Lantus 10 units  CHF Per patient, this was a remote history "in her 30s" for which she does not currently take medication.  Has not taken medication since her 30s. -Follow-up echo  COPD Per patient, this was diagnosed and her PCP tells her she needs inhalers but she declines use.  Is unsure why, just does not really want them.  Reports no home O2 use, no daily inhalers, no rescue inhaler.  Of note, doing well on room air at this time. -Monitor SPO2  Hypertension Patient presents on admission with systolics 136-231, diastolics 45-79.  Mostly 150s-160s/70s. Home meds: Losartan 50 mg.    CKD stage II Creatinine on admission 0.96.  Baseline creatinine 1.05-1.10.  -Monitor morning BMPs  Hyperlipidemia Home meds: Rosuvastatin 10 mg daily.  Tobacco dependence Smokes 1 PPD for 42 years.  -Nicotine patch  Depression  bipolar affective disorder No home medications.   FEN/GI: Heart healthy Prophylaxis: Lovenox  Disposition: med tele  History of Present Illness:  [name] is a 60 y.o. female presenting with left sided chest pain.   She was standing in her kitchen today when she felt a pulling pain in her left chest that radiated down her arm.  She tried to walk to go sit down but felt "funny" ultimately, she took a nitroglycerin tab and felt somewhat improved.  The pain seemed to come back again after a short interval so she took a second nitro and an aspirin.  At that point, she realized that it was time for her to be assessed in the ED.  Since the episodes of pain, she has had some weakness and difficulty walking.   Similar episodes have happen occasionally since Christmas.  She feels anxious and worries that she may be having an anxiety attack.  Saw PCP on 2/22, at that time  reported multiple episodes of substernal and left chest pain radiating down the left arm associated with dyspnea and diaphoresis.  These episodes were reported to resolve with rest after a few minutes.  Episodes usually triggered by exertion, sometimes at rest.  Diagnosed with angina pectoris, given prescription for sublingual nitro with strict ED return precautions.  In the ED, she was found to have tropes trended flat at 6 and 6.  Her EKG was generally unremarkable.  Her heart score was 4-5 and she was admitted for further observation for ACS.  Review Of Systems: Per HPI with the following additions:   Review of Systems  Constitutional: Negative for fever.  HENT: Negative for sore throat.   Respiratory: Positive for shortness of breath. Negative for cough.   Cardiovascular: Positive for chest pain and palpitations. Negative for leg swelling.  Gastrointestinal: Positive for nausea. Negative for abdominal distention, constipation and vomiting.  Genitourinary: Negative for dysuria and enuresis.  Musculoskeletal: Negative for arthralgias.  Skin: Negative for rash.  Neurological: Negative for headaches.     Patient Active Problem List   Diagnosis Date Noted  . Angina pectoris (HCC) 08/26/2020  . Angina at rest [facility]) 08/26/2020  . Microalbuminuria due to type 2 diabetes mellitus (HCC) 01/22/2020  . Bacteremia due to Escherichia coli   . Sepsis (HCC) 12/12/2019  . Hip pain 08/10/2019  . Healthcare maintenance 03/31/2015  . History of CHF (congestive heart failure) 07/16/2013  . COPD (chronic obstructive pulmonary disease) (HCC) 07/16/2013  . Diabetes mellitus type 2 in obese (HCC) 06/11/2013  . Recurrent kidney stones 10/03/2012  . 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 Urinary tract infection 11/12/2011  . Stage 2 chronic kidney disease 11/12/2011  . Morbid obesity (HCC) 11/08/2011  . Knee pain 03/19/2011  . Type 2 diabetes mellitus with complication (HCC) 09/19/2010  .  Hyperlipidemia 09/19/2010  . Bipolar affective disorder (HCC) 09/09/2006  . Major depressive disorder, recurrent episode (HCC) 08/29/2006  . TOBACCO DEPENDENCE 08/29/2006  . HTN (hypertension) 08/29/2006  . Chronic venous insufficiency 08/29/2006    Past Medical History: Past Medical History:  Diagnosis Date  . BACK PAIN, LOW 08/29/2006  . CHF (congestive heart failure) (HCC)   . DEPRESSION, MAJOR, RECURRENT 08/29/2006  . DISORDER, BIPOLAR NOS 09/09/2006  . DMII (diabetes mellitus, type 2) (HCC) 09/19/2010  . Hyperlipidemia 09/19/2010   LDL 78 on 08/2010. Recheck 08/2011.    Marland Kitchen HYPERTENSION, BENIGN SYSTEMIC 08/29/2006   Well controlled on Lopressor and lisinopril. Check BMET 12/2011    . Kidney infection   . OVARIAN MASS 09/18/2007   Benign ovarian cyst s/p laparotomy for removal   . TOBACCO DEPENDENCE 08/29/2006   Patient quit in early months of 2012. Continue to follow for possible relapse.    . Urosepsis 09/27/2012    Past Surgical History: Past Surgical History:  Procedure Laterality Date  . PARTIAL HYSTERECTOMY     unsure if had cervix removed.   . stents Bilateral    Kidneys    Social History: Social History   Tobacco Use  . Smoking status: Current Every Day Smoker    Packs/day: 1.00    Types: Cigarettes  . Smokeless tobacco: Never Used  Substance Use Topics  . Alcohol use: Not Currently  .  Drug use: No   Additional social history: None Please also refer to relevant sections of EMR.  Family History: No family history on file.  Allergies and Medications: Allergies  Allergen Reactions  . Tamsulosin Shortness Of Breath   No current facility-administered medications on file prior to encounter.   Current Outpatient Medications on File Prior to Encounter  Medication Sig Dispense Refill  . acetaminophen (TYLENOL) 500 MG tablet Take 1,000 mg by mouth every 6 (six) hours as needed for mild pain.    Marland Kitchen aspirin 81 MG EC tablet Take 81 mg by mouth every morning.    .  hydrocortisone cream 0.5 % Apply 1 application topically 2 (two) times daily. (Patient taking differently: Apply 1 application topically 2 (two) times daily as needed for itching (rash).) 30 g 0  . insulin glargine (LANTUS) 100 UNIT/ML Solostar Pen Inject 18-20 Units into the skin every morning. (Patient taking differently: Inject 25-27 Units into the skin daily with breakfast.)    . losartan (COZAAR) 50 MG tablet Take 1 tablet (50 mg total) by mouth daily. (Patient taking differently: Take 50 mg by mouth every morning.) 30 tablet 6  . metFORMIN (GLUCOPHAGE) 500 MG tablet Take 2 tablets (1,000 mg total) by mouth 2 (two) times daily with a meal. 60 tablet 2  . Multiple Vitamin (MULTIVITAMIN WITH MINERALS) TABS Take 1 tablet by mouth every morning.    . nitroGLYCERIN (NITROSTAT) 0.4 MG SL tablet Place 1 tablet (0.4 mg total) under the tongue every 5 (five) minutes as needed for chest pain. If after 1 tablet and 5 minutes the chest pain is still present, go to emergency room. 25 tablet 12  . rosuvastatin (CRESTOR) 10 MG tablet Take 1 tablet (10 mg total) by mouth daily. (Patient taking differently: Take 10 mg by mouth every morning.) 90 tablet 3    Objective: BP (!) 138/118   Pulse 82   Temp 97.7 F (36.5 C) (Oral)   Resp 14   Ht 5\' 4"  (1.626 m)   Wt 123.4 kg   SpO2 98%   BMI 46.69 kg/m   Physical Exam General: Awake, alert, oriented Cardiovascular: Regular rate and rhythm, S1 and S2 present, no murmurs auscultated, no JVD appreciated Respiratory: Lung fields clear to auscultation bilaterally Abdomen: Soft, nondistended, no TTP in any quadrant, no rebound tenderness or guarding Extremities: 1+ BLE edema, palpable pedal and pretibial pulses bilaterally Neuro: Cranial nerves II through X grossly intact, able to move all extremities spontaneously   Labs and Imaging: CBC BMET  Recent Labs  Lab 08/26/20 1212  WBC 9.7  HGB 14.4  HCT 44.8  PLT 257   Recent Labs  Lab 08/26/20 1212  NA  136  K 3.9  CL 105  CO2 20*  BUN 11  CREATININE 0.96  GLUCOSE 149*  CALCIUM 8.8*     EKG: Sinus tachycardia at 104, mildly prolonged QTC at 496, no ST elevation  CHEST - 2 VIEW 08/26/2020 COMPARISON:  September 26, 2012 FINDINGS: The heart size and mediastinal contours are within normal limits. Streaky opacities at the right lung base. No visible pleural effusions or pneumothorax. The visualized skeletal structures are unremarkable. IMPRESSION: Streaky opacities at the right lung base, favor atelectasis. Infection or aspiration is not excluded  September 28, 2012, MD 08/26/2020, 5:20 PM PGY-1, [facility] Family Medicine FPTS Intern pager: [phone], text pages welcome  FPTS Upper-Level Resident Addendum   I have independently interviewed and examined the patient. I have discussed the above with the  original Chartered loss adjusterauthor and agree with their documentation. My edits for correction/addition/clarification are in blue. Please see also any attending notes.    Mirian MoFrank, Peter MD PGY-3, [facility] 08/26/2020 5:34 PM  FPTS Service pager: [phone] (text pages welcome through [employer])

## 2020-08-26 NOTE — Assessment & Plan Note (Signed)
Patient has history concerning for angina, though not currently experiencing it at this time. Ideally would refer patient to cardiology for stress test, and get echo, however she does not have insurance. She has also not applied for orange card, difficult to obtain the required documents. Offered for patient to bring documents or email to office to facilitate obtaining required documents. Will also refer to CCM for SW assistance in navigating documents. Will give patient nitrostat 0.4mg  with strict instructions that if chest pain occurs again and she uses sublingual nitro, if cp is not resolved in 5 minutes, she must go to nearest ED. Follow up in one month.

## 2020-08-27 LAB — COMPREHENSIVE METABOLIC PANEL
ALT: 31 U/L (ref 0–44)
AST: 30 U/L (ref 15–41)
Albumin: 3.3 g/dL — ABNORMAL LOW (ref 3.5–5.0)
Alkaline Phosphatase: 81 U/L (ref 38–126)
Anion gap: 9 (ref 5–15)
BUN: 14 mg/dL (ref 6–20)
CO2: 22 mmol/L (ref 22–32)
Calcium: 8.7 mg/dL — ABNORMAL LOW (ref 8.9–10.3)
Chloride: 106 mmol/L (ref 98–111)
Creatinine, Ser: 0.97 mg/dL (ref 0.44–1.00)
GFR, Estimated: >60 mL/min (ref >60)
Glucose, Bld: 104 mg/dL — ABNORMAL HIGH (ref 70–99)
Potassium: 3.6 mmol/L (ref 3.5–5.1)
Sodium: 137 mmol/L (ref 135–145)
Total Bilirubin: 0.7 mg/dL (ref 0.3–1.2)
Total Protein: 6.5 g/dL (ref 6.5–8.1)

## 2020-08-27 LAB — CBC
HCT: 40.6 % (ref 36.0–46.0)
Hemoglobin: 13.6 g/dL (ref 12.0–15.0)
MCH: 31.1 pg (ref 26.0–34.0)
MCHC: 33.5 g/dL (ref 30.0–36.0)
MCV: 92.9 fL (ref 80.0–100.0)
Platelets: 266 10*3/uL (ref 150–400)
RBC: 4.37 MIL/uL (ref 3.87–5.11)
RDW: 12.4 % (ref 11.5–15.5)
WBC: 10.7 10*3/uL — ABNORMAL HIGH (ref 4.0–10.5)
nRBC: 0 % (ref 0.0–0.2)

## 2020-08-27 LAB — ECHOCARDIOGRAM COMPLETE
Area-P 1/2: 3.27 cm2
Height: 64 in
S' Lateral: 2.3 cm
Weight: 4352 oz

## 2020-08-27 LAB — GLUCOSE, CAPILLARY
Glucose-Capillary: 131 mg/dL — ABNORMAL HIGH (ref 70–99)
Glucose-Capillary: 137 mg/dL — ABNORMAL HIGH (ref 70–99)
Glucose-Capillary: 124 mg/dL — ABNORMAL HIGH (ref 70–99)

## 2020-08-27 NOTE — Hospital Course (Signed)
[name] is a 60 y.o. female presenting with chest pain. PMH is significant for T2DM, COPD, bipolar affective disorder, angina pectoris, CHF, HTN, HLD, depression, morbid obesity, CKD stage II, tobacco dependence.  ACS rule out Presented with left side chest pain, worse on exertion for one day.  Took Nitro  and ASA at home without relief.  In the ED initial work up did not reveal STEMI/NSTEMI.  Initial Troponins flat and ECG revealed ST without changes.  Vital signs notable for hypertension SBP 130-230's.  ECHO showed LVEF 60-65% with G1DD. CBC normal. D Dimer negative and Wells sore 0 so PE less likely. Heart score elevated and she was admitted for observation.  Overnight she did not experience any further chest pain.  Cardiology was curbsided prior to discharge and no further workup was recommended in hospital but recommended she follow up outpatient.  She was advised to call Cards office on Monday to make an appointment for later this week.   Follow up -Ensure patient has Cardiology appointment -Repeat BP check

## 2020-08-27 NOTE — Progress Notes (Signed)
FPTS Interim Progress Note  Patient presented yesterday with chest pain and admitted for ACS rule out. Wanted to touch base with cardiology to ensure that no further inpatient work up is needed prior to patient's discharge. Contacted Dr. Flora Lipps who recommended that given negative troponins and EKG lacking concerning findings, patient is appropriate from a cardiology standpoint and can have remainder of follow up with [facility] outpatient. Appreciate cardiology's recommendations.   Reece Leader, DO 08/27/2020, 10:04 AM PGY-1, [facility] Family Medicine Service pager [phone]

## 2020-08-27 NOTE — Progress Notes (Signed)
OT Cancellation Note  Patient Details Name: [name] MRN: [id number] DOB: 1961/01/03   Cancelled Treatment:    Reason Eval/Treat Not Completed: OT screened, no needs identified, will sign off  Suzanna Obey 08/27/2020, 3:54 PM

## 2020-08-27 NOTE — Progress Notes (Addendum)
Ambulated 100 feet in hallway on room air. O2 sats = 95 - 96 % on RA.

## 2020-08-27 NOTE — Discharge Summary (Signed)
Family Medicine Teaching [facility] Discharge Summary  Patient name: [name] Medical record number: [id number] Date of birth: [birth year] Age: 60 y.o. Gender: female Date of Admission: 08/26/2020  Date of Discharge: 08/27/20 Admitting Physician: Fayette Pho, MD  Primary Care Provider: Shirlean Mylar, MD Consultants: Cardiology curbside  Indication for Hospitalization: ACS rule out  Discharge Diagnoses/Problem List:  Chest pain/ACS rule out Type 2 DM HFpEF COPD HTN CKD Stage III HLD Tobacco Dependence Depression/Bipolar Affective Disorder  Disposition: home  Discharge Condition: stable  Discharge Exam:  Physical Exam:  General: 60 y.o. female in NAD Cardio: RRR no m/r/g Lungs: CTAB, no wheezing, no rhonchi, no crackles, no IWOB on room air Abdomen: Soft, non-tender to palpation, non-distended, positive bowel sounds Skin: warm and dry Extremities: No edema  Brief Hospital Course:  [name] is a 60 y.o. female presenting with chest pain. PMH is significant for T2DM, COPD, bipolar affective disorder, angina pectoris, CHF, HTN, HLD, depression, morbid obesity, CKD stage II, tobacco dependence.  ACS rule out Presented with left side chest pain, worse on exertion for one day.  Took Nitro  and ASA at home without relief.  In the ED initial work up did not reveal STEMI/NSTEMI.  Initial Troponins flat and ECG revealed ST without changes.  Vital signs notable for hypertension SBP 130-230's.  ECHO showed LVEF 60-65% with G1DD. CBC normal. D Dimer negative and Wells sore 0 so PE less likely. Heart score elevated and she was admitted for observation.  Overnight she did not experience any further chest pain.  Cardiology was curbsided prior to discharge and no further workup was recommended in hospital but recommended she follow up outpatient.  She was advised to call Cards office on Monday to make an appointment for later this week.   Follow up -Ensure  patient has Cardiology appointment -Repeat BP check    Significant Procedures:   Significant Labs and Imaging:  Recent Labs  Lab 08/26/20 1212 08/27/20 0236  WBC 9.7 10.7*  HGB 14.4 13.6  HCT 44.8 40.6  PLT 257 266   Recent Labs  Lab 08/26/20 1212 08/26/20 1708 08/27/20 0236  NA 136  --  137  K 3.9  --  3.6  CL 105  --  106  CO2 20*  --  22  GLUCOSE 149*  --  104*  BUN 11  --  14  CREATININE 0.96  --  0.97  CALCIUM 8.8*  --  8.7*  MG  --  1.8  --   ALKPHOS  --   --  81  AST  --   --  30  ALT  --   --  31  ALBUMIN  --   --  3.3*    Results/Tests Pending at Time of Discharge:  No results found.  Discharge Medications:  Allergies as of 08/27/2020      Reactions   Tamsulosin Shortness Of Breath      Medication List    TAKE these medications   acetaminophen 500 MG tablet Commonly known as: TYLENOL Take 1,000 mg by mouth every 6 (six) hours as needed for mild pain.   aspirin 81 MG EC tablet Take 81 mg by mouth every morning.   hydrocortisone cream 0.5 % Apply 1 application topically 2 (two) times daily. What changed:   when to take this  reasons to take this   insulin glargine 100 UNIT/ML Solostar Pen Commonly known as: LANTUS Inject 18-20 Units into the skin every morning. What  changed:   how much to take  when to take this   losartan 50 MG tablet Commonly known as: COZAAR Take 1 tablet (50 mg total) by mouth daily. What changed: when to take this   metFORMIN 500 MG tablet Commonly known as: Glucophage Take 2 tablets (1,000 mg total) by mouth 2 (two) times daily with a meal.   multivitamin with minerals Tabs tablet Take 1 tablet by mouth every morning.   nitroGLYCERIN 0.4 MG SL tablet Commonly known as: Nitrostat Place 1 tablet (0.4 mg total) under the tongue every 5 (five) minutes as needed for chest pain. If after 1 tablet and 5 minutes the chest pain is still present, go to emergency room.   rosuvastatin 10 MG tablet Commonly  known as: Crestor Take 1 tablet (10 mg total) by mouth daily. What changed: when to take this       Discharge Instructions: Please refer to Patient Instructions section of EMR for full details.  Patient was counseled important signs and symptoms that should prompt return to medical care, changes in medications, dietary instructions, activity restrictions, and follow up appointments.   Follow-Up Appointments:  Follow-up Information    O'Neal, Ronnald Ramp, MD. Schedule an appointment as soon as possible for a visit.   Specialties: Internal Medicine, Cardiology, Radiology Why: Please make an appointment at your earliest convenience to follow up with cardiology outpatient. Contact information: [address] [place] [address] [phone]        Shirlean Mylar, MD .   Specialty: Family Medicine Contact information: [address] [place] [address] [phone]        [facility]. Go on 08/31/2020.   Why: Appintment at 410pm.  Please arrive 15 mins prior to appointment Contact information: [address] [place] [address] [phone]              Dana Allan, MD 08/27/2020, 6:50 PM PGY-2, [facility] Family Medicine

## 2020-08-27 NOTE — Evaluation (Signed)
Physical Therapy Evaluation Patient Details Name: [name] MRN: [id number] DOB: [birth year] Today's Date: 08/27/2020   History of Present Illness  [name] is a 60 y.o. female presenting with chest pain. PMH is significant for T2DM, COPD, bipolar affective disorder, angina pectoris, CHF, HTN, HLD, depression, morbid obesity, CKD stage II, tobacco dependence.  Work up negative for cardiac or PE markers.  Clinical Impression  Pt is at or close to baseline functioning and should be safe at home alone or with available assist. There are no further acute PT needs.  Will sign off at this time.     Follow Up Recommendations No PT follow up;Supervision - Intermittent    Equipment Recommendations  None recommended by PT    Recommendations for Other Services       Precautions / Restrictions Precautions Precautions: None      Mobility  Bed Mobility Overal bed mobility: Independent                  Transfers Overall transfer level: Independent                  Ambulation/Gait Ambulation/Gait assistance: Independent Gait Distance (Feet): 350 Feet Assistive device: None Gait Pattern/deviations: Step-through pattern   Gait velocity interpretation: >2.62 ft/sec, indicative of community ambulatory General Gait Details: pt able to ambulate steadily without deviation, able to scan, back up, step over obstacles.  Sats maintained at least to 97% on RA.  EHR rose into the upper 110's to low 130's during gait.  Stairs Stairs: Yes Stairs assistance: Modified independent (Device/Increase time) Stair Management: One rail Left;Alternating pattern;Forwards Number of Stairs: 3 General stair comments: safe with the rail  Wheelchair Mobility    Modified Rankin (Stroke Patients Only)       Balance Overall balance assessment: Needs assistance   Sitting balance-Leahy Scale: Good       Standing balance-Leahy Scale: Good                                Pertinent Vitals/Pain Pain Assessment: Faces Faces Pain Scale: No hurt Pain Intervention(s): Monitored during session    Home Living Family/patient expects to be discharged to:: Private residence Living Arrangements: Alone Available Help at Discharge: Family;Available PRN/intermittently Type of Home: Other(Comment) Home Access: Stairs to enter     Home Layout: One level Home Equipment: Walker - 2 wheels;Shower seat;Grab bars - tub/shower      Prior Function Level of Independence: Independent               Hand Dominance   Dominant Hand: Right    Extremity/Trunk Assessment   Upper Extremity Assessment Upper Extremity Assessment: Overall WFL for tasks assessed    Lower Extremity Assessment Lower Extremity Assessment: Overall WFL for tasks assessed       Communication   Communication: No difficulties  Cognition Arousal/Alertness: Awake/alert Behavior During Therapy: WFL for tasks assessed/performed Overall Cognitive Status: Within Functional Limits for tasks assessed                                        General Comments      Exercises     Assessment/Plan    PT Assessment Patent does not need any further PT services  PT Problem List         PT Treatment Interventions  PT Goals (Current goals can be found in the Care Plan section)  Acute Rehab PT Goals PT Goal Formulation: All assessment and education complete, DC therapy    Frequency     Barriers to discharge        Co-evaluation               AM-PAC PT "6 Clicks" Mobility  Outcome Measure Help needed turning from your back to your side while in a flat bed without using bedrails?: None Help needed moving from lying on your back to sitting on the side of a flat bed without using bedrails?: None Help needed moving to and from a bed to a chair (including a wheelchair)?: None Help needed standing up from a chair using your arms (e.g., wheelchair or  bedside chair)?: None Help needed to walk in hospital room?: None Help needed climbing 3-5 steps with a railing? : None 6 Click Score: 24    End of Session   Activity Tolerance: Patient tolerated treatment well Patient left: in bed;with call bell/phone within reach;with family/visitor present Nurse Communication: Mobility status PT Visit Diagnosis: Other abnormalities of gait and mobility (R26.89)    Time: 6387-5643 PT Time Calculation (min) (ACUTE ONLY): 17 min   Charges:   PT Evaluation $PT Eval Low Complexity: 1 Low          08/27/2020  Jacinto Halim., PT Acute Rehabilitation Services [phone]  (pager) [phone]  (office)  [name] 08/27/2020, 2:12 PM

## 2020-08-27 NOTE — Discharge Instructions (Signed)
Thank you for allowing [place] to take care of you during your hospitalization.  You were admitted for chest pain and will need to be follow up with Cardiology as an outpatient.  We will contact the cardiology office to set up an appointment for you and will call you with the appointment time and date.  You are scheduled to follow up with Family Medicine on Wed March 2 at 410 pm for follow up.    If you have chest pain that requires you to take Nitroglycerin, please lay down to take this medication to prevent any dizziness.  If your chest pain does not go away with Nitro then you will need to seek medical attention at your nearest Emergency Department or call 911.

## 2020-08-31 ENCOUNTER — Ambulatory Visit: Payer: Self-pay

## 2020-08-31 ENCOUNTER — Ambulatory Visit (INDEPENDENT_AMBULATORY_CARE_PROVIDER_SITE_OTHER): Payer: Self-pay | Admitting: Family Medicine

## 2020-08-31 ENCOUNTER — Other Ambulatory Visit: Payer: Self-pay

## 2020-08-31 ENCOUNTER — Encounter: Payer: Self-pay | Admitting: Family Medicine

## 2020-08-31 DIAGNOSIS — I10 Essential (primary) hypertension: Secondary | ICD-10-CM

## 2020-08-31 DIAGNOSIS — I209 Angina pectoris, unspecified: Secondary | ICD-10-CM

## 2020-08-31 NOTE — Patient Instructions (Addendum)
It was good to see you today.  Thank you for coming in.  Your blood pressure was very good today at 122/60.  Please follow-up with Cardiology on 3/14.  Come back to see [place] if you have any other issues before then.  Be Well, Dr Pecola Leisure

## 2020-09-01 NOTE — Chronic Care Management (AMB) (Signed)
Care Management    RN Visit Note  09/01/2020 Name: [name] MRN: [id number] DOB: [birth year]  Subjective: [name] is a 60 y.o. year old female who is a primary care patient of Holly Mylar, MD. The care management team was consulted for assistance with disease management and care coordination needs.    Engaged with patient by telephone for initial visit in response to provider referral for case management and/or care coordination services.   Consent to Services:   [name] was given information about Care Management services today including:  1. Care Management services includes personalized support from designated clinical staff supervised by her physician, including individualized plan of care and coordination with other care providers 2. 24/7 contact phone numbers for assistance for urgent and routine care needs. 3. The patient may stop case management services at any time by phone call to the office staff.  Patient agreed to services and consent obtained.    Assessment: Patient is currently experiencing difficulty with food choices to lower her blood sugars... See Care Plan below for interventions and patient self-care actives. Follow up Plan: Patient would like continued follow-up.  CCM RNCM  will outreach the patient within the next 14 days.. Patient will call office if needed prior to next encounter  Review of patient past medical history, allergies, medications, health status, including review of consultants reports, laboratory and other test data, was performed as part of comprehensive evaluation and provision of chronic care management services.   SDOH (Social Determinants of Health) assessments and interventions performed:    Care Plan  Allergies  Allergen Reactions  . Tamsulosin Shortness Of Breath    Outpatient Encounter Medications as of 08/31/2020  Medication Sig Note  . acetaminophen (TYLENOL) 500 MG tablet Take 1,000 mg by mouth every 6  (six) hours as needed for mild pain.   Marland Kitchen aspirin 81 MG EC tablet Take 81 mg by mouth every morning.   . hydrocortisone cream 0.5 % Apply 1 application topically 2 (two) times daily. (Patient taking differently: Apply 1 application topically 2 (two) times daily as needed for itching (rash).)   . insulin glargine (LANTUS) 100 UNIT/ML Solostar Pen Inject 18-20 Units into the skin every morning. (Patient taking differently: Inject 25-27 Units into the skin daily with breakfast.)   . losartan (COZAAR) 50 MG tablet Take 1 tablet (50 mg total) by mouth daily. (Patient taking differently: Take 50 mg by mouth every morning.)   . metFORMIN (GLUCOPHAGE) 500 MG tablet Take 2 tablets (1,000 mg total) by mouth 2 (two) times daily with a meal.   . Multiple Vitamin (MULTIVITAMIN WITH MINERALS) TABS Take 1 tablet by mouth every morning.   . nitroGLYCERIN (NITROSTAT) 0.4 MG SL tablet Place 1 tablet (0.4 mg total) under the tongue every 5 (five) minutes as needed for chest pain. If after 1 tablet and 5 minutes the chest pain is still present, go to emergency room. 08/26/2020: 2 tablets today  . rosuvastatin (CRESTOR) 10 MG tablet Take 1 tablet (10 mg total) by mouth daily. (Patient taking differently: Take 10 mg by mouth every morning.)    No facility-administered encounter medications on file as of 08/31/2020.    Patient Active Problem List   Diagnosis Date Noted  . Angina pectoris (HCC) 08/26/2020  . Angina at rest [facility]) 08/26/2020  . Microalbuminuria due to type 2 diabetes mellitus (HCC) 01/22/2020  . Bacteremia due to Escherichia coli   . Sepsis (HCC) 12/12/2019  . Hip pain 08/10/2019  .  Healthcare maintenance 03/31/2015  . History of CHF (congestive heart failure) 07/16/2013  . COPD (chronic obstructive pulmonary disease) (HCC) 07/16/2013  . Diabetes mellitus type 2 in obese (HCC) 06/11/2013  . Recurrent kidney stones 10/03/2012  . 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 Urinary tract infection  11/12/2011  . Stage 2 chronic kidney disease 11/12/2011  . Morbid obesity (HCC) 11/08/2011  . Knee pain 03/19/2011  . Type 2 diabetes mellitus with complication (HCC) 09/19/2010  . Hyperlipidemia 09/19/2010  . Bipolar affective disorder (HCC) 09/09/2006  . Major depressive disorder, recurrent episode (HCC) 08/29/2006  . TOBACCO DEPENDENCE 08/29/2006  . HTN (hypertension) 08/29/2006  . Chronic venous insufficiency 08/29/2006    Conditions to be addressed/monitored: DMII  Care Plan : RN Case Manager  Updates made by Holly Fairly, RN since 09/01/2020 12:00 AM  Problem: Glycemic Management (Diabetes, Type 2)   Priority: High  Onset Date: 08/31/2020  Objective:  Lab Results  Component Value Date   HGBA1C 9.9 (A) 07/22/2020 .   Lab Results  Component Value Date   CREATININE 0.97 08/27/2020   CREATININE 0.96 08/26/2020   CREATININE 1.03 (H) 01/19/2020    Current Barriers:  Marland Kitchen Knowledge Deficits related to basic Diabetes pathophysiology and self care/management  Case Manager Clinical Goal(s):  Over the next 90 days, patient will demonstrate improved adherence to prescribed treatment plan for diabetes self care/management as evidenced by: lowering her a1c by 1-2 points . Blood sugar range will be between 125-135  Interventions:  . Provided education to patient about basic DM disease process . Reviewed medications with patient and discussed importance of medication adherence . Discussed plans with patient for ongoing care management follow up and provided patient with direct contact information for care management team . Provided patient with verbal educational related to hypo and hyperglycemia and importance of correct treatment . Advised patient, providing education and rationale, to check cbg and record, calling the office for findings outside established parameters.   . Review of patient status, including review of consultants reports, relevant laboratory and other test results, and  medications completed. Marland Kitchen activity based on tolerance and functional limitations encouraged . healthy lifestyle promoted  Anticipate A1C testing (point-of-care) every 3 to 6 months based on goal attainment.   Review mutually-set A1C goal or target range. -7 or below patient states that she would like to lower her a1c  Anticipate use of antihyperglycemic with or without insulin and periodic adjustments; consider active involvement of pharmacist.   Compare self-reported symptoms of hypo or hyperglycemia to blood glucose levels, diet and fluid intake, current medications, psychosocial and physiologic stressors, change in activity and barriers to care adherence.   Promote self-monitoring of blood glucose levels.   Assess and address barriers to management plan, such as food insecurity, age, developmental ability, depression, anxiety, fear of hypoglycemia or weight gain, as well as medication cost, side effects and complicated regimen.  . modest weight loss (5 percent) promoted . quality of sleep assessed . blood glucose readings reviewed patient reports today 167 in am yesterday 128 and the day before 123 . self-awareness of signs/symptoms of hypo or hyperglycemia encouraged . Educational material [employer] and calendar.  Will also send EMMI   Patient Goals/Self Care Activities:   Patient verbalizes understanding of plan Self-administers medications as prescribed  Calls pharmacy for medication refills  Call's provider office for new concerns or questions  check blood sugar at prescribed times  check blood sugar if I feel it is too high or too  low  take the blood sugar meter to all doctor visits         Lazaro Arms RN, BSN, [facility] Care Management Coordinator [place] Phone: [phone] I Fax: [phone]

## 2020-09-01 NOTE — Patient Instructions (Signed)
[name]  it was nice speaking with you. Please call me directly [phone] if you have questions about the goals we discussed.  Goals Addressed            This Visit's Progress   . Monitor and Manage My Blood Sugar-Diabetes Type 2       Timeframe:  Long-Range Goal Priority:  High Start Date:    08/31/20                         Expected End Date:  11/29/20                     Follow Up Date 09/15/20   - check blood sugar at prescribed times - check blood sugar if I feel it is too high or too low - enter blood sugar readings and medication or insulin into daily log - take the blood sugar log to all doctor visits    Why is this important?    Checking your blood sugar at home helps to keep it from getting very high or very low.   Writing the results in a diary or log helps the doctor know how to care for you.   Your blood sugar log should have the time, date and the results.   Also, write down the amount of insulin or other medicine that you take.   Other information, like what you ate, exercise done and how you were feeling, will also be helpful.     Notes:        Patient Care Plan: RN Case Manager  Problem Identified: Glycemic Management (Diabetes, Type 2)   Priority: High  Onset Date: 08/31/2020  Note:   Objective:  Lab Results  Component Value Date   HGBA1C 9.9 (A) 07/22/2020 .   Lab Results  Component Value Date   CREATININE 0.97 08/27/2020   CREATININE 0.96 08/26/2020   CREATININE 1.03 (H) 01/19/2020    Current Barriers:  Marland Kitchen Knowledge Deficits related to basic Diabetes pathophysiology and self care/management  Case Manager Clinical Goal(s):  Over the next 90 days, patient will demonstrate improved adherence to prescribed treatment plan for diabetes self care/management as evidenced by: lowering her a1c by 1-2 points . Blood sugar range will be between 125-135  Interventions:  . Provided education to patient about basic DM disease process . Reviewed  medications with patient and discussed importance of medication adherence . Discussed plans with patient for ongoing care management follow up and provided patient with direct contact information for care management team . Provided patient with verbal educational related to hypo and hyperglycemia and importance of correct treatment . Advised patient, providing education and rationale, to check cbg and record, calling the office for findings outside established parameters.   . Review of patient status, including review of consultants reports, relevant laboratory and other test results, and medications completed. Marland Kitchen activity based on tolerance and functional limitations encouraged . healthy lifestyle promoted  Anticipate A1C testing (point-of-care) every 3 to 6 months based on goal attainment.   Review mutually-set A1C goal or target range. -7 or below patient states that she would like to lower her a1c  Anticipate use of antihyperglycemic with or without insulin and periodic adjustments; consider active involvement of pharmacist.   Compare self-reported symptoms of hypo or hyperglycemia to blood glucose levels, diet and fluid intake, current medications, psychosocial and physiologic stressors, change in activity and barriers to care adherence.  Promote self-monitoring of blood glucose levels.   Assess and address barriers to management plan, such as food insecurity, age, developmental ability, depression, anxiety, fear of hypoglycemia or weight gain, as well as medication cost, side effects and complicated regimen.  . modest weight loss (5 percent) promoted . quality of sleep assessed . blood glucose readings reviewed patient reports today 167 in am yesterday 128 and the day before 123 . self-awareness of signs/symptoms of hypo or hyperglycemia encouraged . Educational material [employer] and calendar.  Will also send EMMI   Patient Goals/Self Care Activities:   Patient  verbalizes understanding of plan Self-administers medications as prescribed  Calls pharmacy for medication refills  Call's provider office for new concerns or questions  check blood sugar at prescribed times  check blood sugar if I feel it is too high or too low  take the blood sugar meter to all doctor visits          [name] received Care Management services today:  1. Care Management services include personalized support from designated clinical staff supervised by her physician, including individualized plan of care and coordination with other care providers 2. 24/7 contact [phone] for assistance for urgent and routine care needs. 3. Care Management are voluntary services and be declined at any time by calling the office.  The patient verbalized understanding of instructions provided today and declined a print copy of patient instruction materials.    Follow Up Plan: Patient would like continued follow-up.  CCM RNCM will outreach to the patient within the next 14 days .Marland Kitchen Patient will call office if needed prior to next encounter   Juanell Fairly, RN

## 2020-09-02 NOTE — Progress Notes (Signed)
    SUBJECTIVE:   CHIEF COMPLAINT / HPI: BP check  Patient here after hospital stay rule out ACS.  Indicates has not not had any chest pain since hospital discharge.  Denies symptoms of difficulty breathing, headaches, lightheadedness, vision changes or numbness/tingling.  PERTINENT  PMH / PSH: Hypertension  OBJECTIVE:   BP 122/60   Pulse 94   Ht 5\' 4"  (1.626 m)   Wt 274 lb 2 oz (124.3 kg)   SpO2 97%   BMI 47.05 kg/m    Physical Exam Constitutional:      General: She is not in acute distress.    Appearance: She is not ill-appearing.  HENT:     Head: Normocephalic and atraumatic.     Mouth/Throat:     Mouth: Mucous membranes are moist.  Cardiovascular:     Rate and Rhythm: Normal rate and regular rhythm.  Pulmonary:     Effort: Pulmonary effort is normal.     Breath sounds: Normal breath sounds.  Skin:    General: Skin is warm.  Neurological:     Mental Status: She is alert.  Psychiatric:        Mood and Affect: Mood normal.        Behavior: Behavior normal.     ASSESSMENT/PLAN:   HTN (hypertension) Well controlled today at 122/60. - Continue Cozaar - Continue to monitor BP  Angina pectoris (HCC) No recurrent chest pain or current issues or concerns.  Has follow up scheduled with Cardiologist on 09/12/20. - follow up with Cardiology     09/14/20, MD [facility] Medicine [facility]

## 2020-09-02 NOTE — Assessment & Plan Note (Signed)
No recurrent chest pain or current issues or concerns.  Has follow up scheduled with Cardiologist on 09/12/20. - follow up with Cardiology

## 2020-09-02 NOTE — Assessment & Plan Note (Signed)
Well controlled today at 122/60. - Continue Cozaar - Continue to monitor BP

## 2020-09-11 NOTE — Progress Notes (Deleted)
Cardiology Office Note:   Date:  09/11/2020  NAME:  [name]    MRN: [id number] DOB:  10-13-1960   PCP:  Shirlean Mylar, MD  Cardiologist:  No primary care provider on file.  Electrophysiologist:  None   Referring MD: Shirlean Mylar, MD   No chief complaint on file. ***  History of Present Illness:   [name] is a 60 y.o. female with a hx of *** who is being seen today for the evaluation of *** at the request of ***.  Problem List 1. DM -A1c 9.9 2. HLD -T chol 140, HDL 29, LDL 82, TG 144 3. HTN 4. Obesity -BMI 47   Past Medical History: Past Medical History:  Diagnosis Date  . BACK PAIN, LOW 08/29/2006  . CHF (congestive heart failure) (HCC)   . DEPRESSION, MAJOR, RECURRENT 08/29/2006  . DISORDER, BIPOLAR NOS 09/09/2006  . DMII (diabetes mellitus, type 2) (HCC) 09/19/2010  . Hyperlipidemia 09/19/2010   LDL 78 on 08/2010. Recheck 08/2011.    Marland Kitchen HYPERTENSION, BENIGN SYSTEMIC 08/29/2006   Well controlled on Lopressor and lisinopril. Check BMET 12/2011    . Kidney infection   . OVARIAN MASS 09/18/2007   Benign ovarian cyst s/p laparotomy for removal   . TOBACCO DEPENDENCE 08/29/2006   Patient quit in early months of 2012. Continue to follow for possible relapse.    . Urosepsis 09/27/2012    Past Surgical History: Past Surgical History:  Procedure Laterality Date  . PARTIAL HYSTERECTOMY     unsure if had cervix removed.   . stents Bilateral    Kidneys    Current Medications: No outpatient medications have been marked as taking for the 09/12/20 encounter (Appointment) with O'Neal, Ronnald Ramp, MD.     Allergies:    Tamsulosin   Social History: Social History   Socioeconomic History  . Marital status: Single    Spouse name: Not on file  . Number of children: Not on file  . Years of education: Not on file  . Highest education level: Not on file  Occupational History  . Occupation: Electrical engineer    Comment: Designer, industrial/product   Tobacco Use  . Smoking status: Current Every Day Smoker    Packs/day: 1.00    Types: Cigarettes  . Smokeless tobacco: Never Used  Vaping Use  . Vaping Use: Never used  Substance and Sexual Activity  . Alcohol use: Not Currently  . Drug use: No  . Sexual activity: Not Currently  Other Topics Concern  . Not on file  Social History Narrative  . Not on file   Social Determinants of Health   Financial Resource Strain: Not on file  Food Insecurity: Not on file  Transportation Needs: Not on file  Physical Activity: Not on file  Stress: Not on file  Social Connections: Not on file     Family History: The patient's ***family history is not on file.  ROS:   All other ROS reviewed and negative. Pertinent positives noted in the HPI.     EKGs/Labs/Other Studies Reviewed:   The following studies were personally reviewed by me today:  EKG:  EKG is *** ordered today.  The ekg ordered today demonstrates ***, and was personally reviewed by me.   TTE 08/27/2020 1. Left ventricular ejection fraction, by estimation, is 60 to 65%. The  left ventricle has normal function. The left ventricle has no regional  wall motion abnormalities. Left ventricular diastolic parameters are  consistent with Grade  I diastolic  dysfunction (impaired relaxation).  2. Right ventricular systolic function is normal. The right ventricular  size is normal.  3. The mitral valve is normal in structure. Trivial mitral valve  regurgitation. No evidence of mitral stenosis.  4. The aortic valve was not well visualized. Aortic valve regurgitation  is not visualized. No aortic stenosis is present.  5. The inferior vena cava is normal in size with greater than 50%  respiratory variability, suggesting right atrial pressure of 3 mmHg.   Recent Labs: 08/26/2020: B Natriuretic Peptide 25.5; Magnesium 1.8; TSH 0.598 08/27/2020: ALT 31; BUN 14; Creatinine, Ser 0.97; Hemoglobin 13.6; Platelets 266; Potassium 3.6; Sodium  137   Recent Lipid Panel    Component Value Date/Time   CHOL 140 12/12/2019 1312   TRIG 144 12/12/2019 1312   HDL 29 (L) 12/12/2019 1312   CHOLHDL 4.8 12/12/2019 1312   VLDL 29 12/12/2019 1312   LDLCALC 82 12/12/2019 1312   LDLDIRECT 112 (H) 01/07/2013 1437    Physical Exam:   VS:  There were no vitals taken for this visit.   Wt Readings from Last 3 Encounters:  08/31/20 274 lb 2 oz (124.3 kg)  08/27/20 270 lb 11.6 oz (122.8 kg)  08/23/20 272 lb 4 oz (123.5 kg)    General: Well nourished, well developed, in no acute distress Head: Atraumatic, normal size  Eyes: PEERLA, EOMI  Neck: Supple, no JVD Endocrine: No thryomegaly Cardiac: Normal S1, S2; RRR; no murmurs, rubs, or gallops Lungs: Clear to auscultation bilaterally, no wheezing, rhonchi or rales  Abd: Soft, nontender, no hepatomegaly  Ext: No edema, pulses 2+ Musculoskeletal: No deformities, BUE and BLE strength normal and equal Skin: Warm and dry, no rashes   Neuro: Alert and oriented to person, place, time, and situation, CNII-XII grossly intact, no focal deficits  Psych: Normal mood and affect   ASSESSMENT:   [name] is a 60 y.o. female who presents for the following: No diagnosis found.  PLAN:   There are no diagnoses linked to this encounter.  Disposition: No follow-ups on file.  Medication Adjustments/Labs and Tests Ordered: Current medicines are reviewed at length with the patient today.  Concerns regarding medicines are outlined above.  No orders of the defined types were placed in this encounter.  No orders of the defined types were placed in this encounter.   There are no Patient Instructions on file for this visit.   Time Spent with Patient: I have spent a total of *** minutes with patient reviewing hospital notes, telemetry, EKGs, labs and examining the patient as well as establishing an assessment and plan that was discussed with the patient.  > 50% of time was spent in direct patient  care.  Signed, Lenna Gilford. Flora Lipps, MD, [place] [facility]  [facility]  [address], [address] [place], [place] [address] [phone]  09/11/2020 7:57 PM

## 2020-09-12 ENCOUNTER — Ambulatory Visit: Payer: Self-pay | Admitting: Cardiovascular Disease

## 2020-09-12 DIAGNOSIS — R079 Chest pain, unspecified: Secondary | ICD-10-CM

## 2020-09-12 DIAGNOSIS — E782 Mixed hyperlipidemia: Secondary | ICD-10-CM

## 2020-09-15 ENCOUNTER — Telehealth: Payer: Self-pay

## 2020-09-15 NOTE — Telephone Encounter (Signed)
  Care Management   Outreach Note  09/15/2020 Name: [name] MRN: [id number] DOB: 10-03-1960  Referred by: Shirlean Mylar, MD Reason for referral : Chronic Care Management (DM II)   An unsuccessful telephone outreach was attempted today. The patient was referred to the case management team for assistance with care management and care coordination.  Unable to leave a message due to mailbox full.  Follow Up Plan: The care management team will reach out to the patient again over the next 7-14 days.   Juanell Fairly RN, BSN, [facility] Care Management Coordinator [facility] Phone: [phone] I Fax: [phone]

## 2020-09-30 ENCOUNTER — Telehealth: Payer: Self-pay | Admitting: *Deleted

## 2020-09-30 NOTE — Chronic Care Management (AMB) (Signed)
  Care Management   Note  09/30/2020 Name: [name] MRN: [id number] DOB: 06/30/1961  [name] is a 60 y.o. year old female who is a primary care patient of Shirlean Mylar, MD and is actively engaged with the care management team. I reached out to [name] by phone today to assist with re-scheduling a follow up visit with the RN Case Manager  Follow up plan: Unsuccessful telephone outreach attempt made. The care management team will reach out to the patient again over the next 7 days. If patient returns call to provider office, please advise to call Embedded Care Management Care Guide Gwenevere Ghazi at [phone].  Gwenevere Ghazi  Care Guide, Embedded Care Coordination [facility] Management

## 2020-10-01 ENCOUNTER — Other Ambulatory Visit: Payer: Self-pay

## 2020-10-03 ENCOUNTER — Ambulatory Visit (INDEPENDENT_AMBULATORY_CARE_PROVIDER_SITE_OTHER): Payer: Self-pay | Admitting: Family Medicine

## 2020-10-03 DIAGNOSIS — E1165 Type 2 diabetes mellitus with hyperglycemia: Secondary | ICD-10-CM

## 2020-10-03 DIAGNOSIS — Z794 Long term (current) use of insulin: Secondary | ICD-10-CM

## 2020-10-03 NOTE — Progress Notes (Signed)
No show. Called, no answer. Sent letter.  Shirlean Mylar, MD [facility], PGY-2

## 2020-10-03 NOTE — Patient Instructions (Signed)
It was a pleasure to see you today!  1.    Be Well,  Dr. Mahoney  

## 2020-10-05 ENCOUNTER — Other Ambulatory Visit: Payer: Self-pay

## 2020-10-05 DIAGNOSIS — E118 Type 2 diabetes mellitus with unspecified complications: Secondary | ICD-10-CM

## 2020-10-05 MED FILL — Metformin HCl Tab 500 MG: ORAL | 15 days supply | Qty: 60 | Fill #0 | Status: AC

## 2020-10-05 MED FILL — Insulin Glargine Soln Pen-Injector 100 Unit/ML: SUBCUTANEOUS | Qty: 3 | Fill #0 | Status: CN

## 2020-10-06 ENCOUNTER — Other Ambulatory Visit: Payer: Self-pay

## 2020-10-06 MED ORDER — INSULIN GLARGINE 100 UNIT/ML SOLOSTAR PEN: 18.0000 [IU] | PEN_INJECTOR | SUBCUTANEOUS | Status: DC

## 2020-10-06 NOTE — Chronic Care Management (AMB) (Signed)
  Care Management   Note  10/06/2020 Name: SADEEL FIDDLER MRN: [id number] DOB: 04/14/1961  [name] is a 60 y.o. year old female who is a primary care patient of Shirlean Mylar, MD and is actively engaged with the care management team. I reached out to [name] by phone today to assist with re-scheduling a follow up visit with the RN Case Manager  Follow up plan: A second unsuccessful telephone outreach attempt made. The care management team will reach out to the patient again over the next 7 days. If patient returns call to provider office, please advise to call Embedded Care Management Care Guide Gwenevere Ghazi at [phone].  Gwenevere Ghazi  Care Guide, Embedded Care Coordination [facility] Management

## 2020-10-07 ENCOUNTER — Telehealth: Payer: Self-pay

## 2020-10-07 NOTE — Telephone Encounter (Signed)
   Telephone encounter was:  Successful.  10/07/2020 Name: ROSELIE CIRIGLIANO MRN: [id number] DOB: 1960-08-07  YOUNIQUE CASAD is a 60 y.o. year old female who is a primary care patient of Shirlean Mylar, MD . The community resource team was consulted for assistance with [facility] card information and application  Care guide performed the following interventions: Patient provided with information about care guide support team and interviewed to confirm resource needs  Spoke with patient verified email I will email orange card application resources to patient at [email or web address] next week.  Follow Up Plan:  Care guide will follow up with patient by phone over the next 7 days  Kadence Mikkelson, AAS Paralegal, [facility] Care Guide . Embedded Care Coordination [facility]  Care Management  [address] [place], [place] [address] ??[email or web address]  ?? [phone]   [email or web address]

## 2020-10-10 ENCOUNTER — Telehealth: Payer: Self-pay

## 2020-10-10 NOTE — Telephone Encounter (Signed)
   Telephone encounter was:  Successful.  10/10/2020 Name: LIVI MCGANN MRN: [id number] DOB: March 01, 1961  NEETI KNUDTSON is a 60 y.o. year old female who is a primary care patient of Shirlean Mylar, MD . The community resource team was consulted for assistance with [facility] card application  Care guide performed the following interventions: Follow up call placed to the patient to discuss status of referral.  Follow Up Plan:  No further follow up planned at this time. The patient has been provided with needed resources. and Spoke with patient she has received the emailed orange card information and application. No further assistance is needed at this time.  Oris Staffieri, AAS Paralegal, [facility] Care Guide . Embedded Care Coordination [facility] Health  Care Management  [address] [place], [place] [address] ??[email or web address]  ?? [phone]   [email or web address]

## 2020-10-12 ENCOUNTER — Other Ambulatory Visit: Payer: Self-pay

## 2020-10-12 ENCOUNTER — Encounter: Payer: Self-pay | Admitting: Family Medicine

## 2020-10-12 DIAGNOSIS — E118 Type 2 diabetes mellitus with unspecified complications: Secondary | ICD-10-CM

## 2020-10-12 MED ORDER — INSULIN GLARGINE 100 UNIT/ML SOLOSTAR PEN
18.0000 [IU] | PEN_INJECTOR | SUBCUTANEOUS | 0 refills | Status: DC
  Filled 2020-10-12: qty 3, 15d supply, fill #0
  Filled 2020-10-12: qty 6, 30d supply, fill #0

## 2020-10-12 MED FILL — Insulin Glargine Soln Pen-Injector 100 Unit/ML: SUBCUTANEOUS | Qty: 3 | Fill #0 | Status: CN

## 2020-10-13 ENCOUNTER — Other Ambulatory Visit: Payer: Self-pay

## 2020-10-17 NOTE — Telephone Encounter (Signed)
Scheduled for 10/25/2020. Patient aware.  [name]  Care Guide, Embedded Care Coordination [facility] Management

## 2020-10-17 NOTE — Chronic Care Management (AMB) (Signed)
  Care Management   Note  10/17/2020 Name: [name] MRN: [id number] DOB: August 15, 1960  [name] is a 60 y.o. year old female who is a primary care patient of Shirlean Mylar, MD and is actively engaged with the care management team. I reached out to [name] by phone today to assist with re-scheduling a follow up visit with the RN Case Manager  Follow up plan: Telephone appointment with care management team member scheduled for:10/25/2020  [facility] Guide, Embedded Care Coordination [facility] Management

## 2020-10-19 ENCOUNTER — Ambulatory Visit (INDEPENDENT_AMBULATORY_CARE_PROVIDER_SITE_OTHER): Payer: Self-pay | Admitting: Pharmacist

## 2020-10-19 ENCOUNTER — Other Ambulatory Visit: Payer: Self-pay

## 2020-10-19 DIAGNOSIS — E782 Mixed hyperlipidemia: Secondary | ICD-10-CM

## 2020-10-19 DIAGNOSIS — E118 Type 2 diabetes mellitus with unspecified complications: Secondary | ICD-10-CM

## 2020-10-19 NOTE — Progress Notes (Signed)
Subjective:    Patient ID: [name], female    DOB: [birth year], 60 y.o.   MRN: [id number]  HPI Patient is a 60 y.o. female who presents for diabetes management. She is in good spirits and presents without assistance. Patient was referred on 07/22/20 and last seen by provider, Dr. Pecola Leisure, on 08/31/20.  Insurance coverage/medication affordability: self-pay  Current diabetes medications include: insulin glargine (Lantus) 25-27 units depending on blood glucose, metformin 2 tabs BID Current hypertension medications include: losartan 50mg  Current hyperlipidemia medications include: rosuvastatin 10mg  Patient states that She is taking her diabetes medications as prescribed. Patient reports adherence with medications. Patient states that She misses her second dose of metformin once a week, on average.  Does you feel that your medications are working for you?  yes  Have you been experiencing any side effects to the medications prescribed? no  Do you have any problems obtaining medications due to transportation or finances?  no    Patient reported dietary habits:  Eats 4-5x/day because of work schedule and they are not always a "full meal, sometimes more of a snack" Breakfast: oatmeal; sausage biscuit; yogurt Lunch: salad or sandwich Dinner: sometimes skips but if she does eat will have chicken with rice Snacks: cookies, peanut butter crackers, peanuts Drinks: diet coke, coffee, sprite zero, occassionally water  Patient-reported exercise habits: on feet walking all day for work  Patient denies hypoglycemic events. Patient denies polyuria (increased urination).  Patient denies polyphagia (increased appetite).  Patient denies polydipsia (increased thirst).  Patient reports neuropathy (nerve pain). Patient reports visual changes but hasn't been to eye doctor in 2 years so she states she will make appt Patient reports self foot exams.   Home fasting and random blood glucose  readings: mid 100's; lowest 90 and highest 220 (d/t forgetting metformin dose night before)  Objective:   Labs:    Lab Results  Component Value Date   HGBA1C 9.9 (A) 07/22/2020   HGBA1C 10.9 (H) 12/13/2019   HGBA1C 10.1 (A) 08/10/2019    Lab Results  Component Value Date   MICRALBCREAT 50 (H) 01/19/2020    Lipid Panel     Component Value Date/Time   CHOL 140 12/12/2019 1312   TRIG 144 12/12/2019 1312   HDL 29 (L) 12/12/2019 1312   CHOLHDL 4.8 12/12/2019 1312   VLDL 29 12/12/2019 1312   LDLCALC 82 12/12/2019 1312   LDLDIRECT 112 (H) 01/07/2013 1437    Clinical Atherosclerotic Cardiovascular Disease (ASCVD): Yes  The 10-year ASCVD risk score 02/11/2020 DC Jr., et al., 2013) is: 16.1%   Values used to calculate the score:     Age: 26 years     Sex: Female     Is Non-Hispanic African American: No     Diabetic: Yes     Tobacco smoker: Yes     Systolic Blood Pressure: 122 mmHg     Is BP treated: Yes     HDL Cholesterol: 29 mg/dL     Total Cholesterol: 140 mg/dL   PHQ-9 Score: did not complete  Assessment/Plan:   T2DM is uncontrolled but much improved likely due to patient adherence and improvement in dietary choices. Additional pharmacotherapy is warranted as patient could benefit from GLP-1 initiation. Will initiate application for patient assistance for Ozempic 0.25mg . In addition, plan is to include on application prescription for pen needles as well as insulin degludec 2014). If approved we can transition patient off of insulin glargine (Lantus) to insulin degludec 41). Benefits include  longer 1/2 life as well as having all medications come through one manufacturer. Patient educated on purpose, proper use and potential adverse effects of Ozempic. Demonstrated proper administration of medication.  Following instruction patient verbalized understanding of treatment plan.    1. Continued basal insulin glargine (Lantus) 25 units once daily.  2. Extensively discussed  pathophysiology of diabetes, dietary effects on blood sugar control, and recommended lifestyle interventions. 3. Patient will adhere to dietary modifications including increasing water consumption. 4. Counseled on s/sx of and management of hypoglycemia 5. Next A1C anticipated at appt with PCP in May.   ASCVD risk - secondary prevention in patient with diabetes. Last LDL is not controlled. ASCVD risk score is not >20%  - high intensity statin indicated. Aspirin is indicated.   1. Continued aspirin 81 mg  2. Continued rosuvastatin 10 mg.  3. Obtain updated lipid panel at appt with PCP in May and at that time should consider increasing dose of rosuvastatin to 20mg .   Follow-up appointment with PCP to review sugar readings at next scheduled appt. Written patient instructions provided.  This appointment required 40 minutes of patient care (this includes precharting, chart review, review of results, and face-to-face care).  Thank you for involving pharmacy to assist in providing this patient's care.  Patient seen with , PharmD Candidate

## 2020-10-19 NOTE — Patient Instructions (Signed)
Ms. Melendrez it was a pleasure seeing you today. You are doing great with your blood sugar management!  Please do the following:  1. Continue Lantus 25 units once daily as directed today during your appointment. If you have any questions or if you believe something has occurred because of this change, call me or your doctor to let one of [place] know.  2. Continue checking blood sugars at home. It's really important that you record these and bring these in to your next doctor's appointment.  3. Continue making the lifestyle changes we've discussed together during our visit. Diet and exercise play a significant role in improving your blood sugars.  4. I am going to start the patient assistance application process for Ozempic, pen needles, and [place]. At the moment you do not have to worry about anything for this. We will let you know if we need any additional information. 5. Follow-up with Dr. Leary Roca at your next appointment.    Hypoglycemia or low blood sugar:   Low blood sugar can happen quickly and may become an emergency if not treated right away.   While this shouldn't happen often, it can be brought upon if you skip a meal or do not eat enough. Also, if your insulin or other diabetes medications are dosed too high, this can cause your blood sugar to go to low.   Warning signs of low blood sugar include: 1. Feeling shaky or dizzy 2. Feeling weak or tired  3. Excessive hunger 4. Feeling anxious or upset  5. Sweating even when you aren't exercising  What to do if I experience low blood sugar? Follow the Rule of 15 1. Check your blood sugar with your meter. If lower than 70, proceed to step 2.  2. Treat with 15 grams of fast acting carbs which is found in 3-4 glucose tablets. If none are available you can try hard candy, 1 tablespoon of sugar or honey,4 ounces of fruit juice, or 6 ounces of REGULAR soda.  3. Re-check your sugar in 15 minutes. If it is still below 70, do what you did in step 2  again. If your blood sugar has come back up, go ahead and eat a snack or small meal made up of complex carbs (ex. Whole grains) and protein at this time to avoid recurrence of low blood sugar.

## 2020-10-20 ENCOUNTER — Telehealth: Payer: Self-pay

## 2020-10-20 NOTE — Telephone Encounter (Signed)
Reached out to patient about needing proof of income for patient assistance application (novo nordisk for tresiba and ozempic). Told her I could use a paystub, last years tax return, or I could write an advocate letter. She told me she could "send it in", then hung up because she was at work and "had to go". Will try to follow up in a week or so if I do not receive paperwork before then.

## 2020-10-20 NOTE — Assessment & Plan Note (Signed)
T2DM is uncontrolled but much improved likely due to patient adherence and improvement in dietary choices. Additional pharmacotherapy is warranted as patient could benefit from GLP-1 initiation. Will initiate application for patient assistance for Ozempic 0.25mg . In addition, plan is to include on application prescription for pen needles as well as insulin degludec Evaristo Bury). If approved we can transition patient off of insulin glargine (Lantus) to insulin degludec Evaristo Bury). Benefits include longer 1/2 life as well as having all medications come through one manufacturer. Patient educated on purpose, proper use and potential adverse effects of Ozempic. Demonstrated proper administration of medication.  Following instruction patient verbalized understanding of treatment plan.    1. Continued basal insulin glargine (Lantus) 25 units once daily.  2. Extensively discussed pathophysiology of diabetes, dietary effects on blood sugar control, and recommended lifestyle interventions. 3. Patient will adhere to dietary modifications including increasing water consumption. 4. Counseled on s/sx of and management of hypoglycemia 5. Next A1C anticipated at appt with PCP in May.

## 2020-10-20 NOTE — Assessment & Plan Note (Signed)
ASCVD risk - secondary prevention in patient with diabetes. Last LDL is not controlled. ASCVD risk score is not >20%  - high intensity statin indicated. Aspirin is indicated.   1. Continued aspirin 81 mg  2. Continued rosuvastatin 10 mg.  3. Obtain updated lipid panel at appt with PCP in May and at that time should consider increasing dose of rosuvastatin to 20mg .

## 2020-10-25 ENCOUNTER — Telehealth: Payer: Self-pay

## 2020-10-25 NOTE — Telephone Encounter (Signed)
Attempted to contact patient to let her know her patient assistance medication from [employer] streamer) was delivered to office (family medicine).   Pt voicemail was full.  Medication will be labeled in fridge in med room.

## 2020-10-25 NOTE — Telephone Encounter (Signed)
   RN Case Manager Care Management   Phone Outreach    10/25/2020 Name: [name] MRN: [id number] DOB: 27-Mar-1961  [name] is a 60 y.o. year old female who is a primary care patient of Shirlean Mylar, MD .   2nd unsuccessful telephone outreach attempt.  If unable to reach patient by phone on the 3rd attempt, will discontinue outreach calls but will be available at any time to provide services.   Plan:Will route chart to Care Guide to reschedule phone appointment   Review of patient status, including review of consultants reports, relevant laboratory and other test results, and collaboration with appropriate care team members and the patient's provider was performed as part of comprehensive patient evaluation and provision of care management services.    Juanell Fairly RN, BSN, [facility] Care Management Coordinator [facility] Phone: [phone] I Fax: [phone]

## 2020-10-26 ENCOUNTER — Telehealth: Payer: Self-pay | Admitting: *Deleted

## 2020-10-26 NOTE — Chronic Care Management (AMB) (Signed)
  Care Management   Note  10/26/2020 Name: [name] MRN: [id number] DOB: 11-08-1960  [name] is a 60 y.o. year old female who is a primary care patient of Shirlean Mylar, MD and is actively engaged with the care management team. I reached out to [name] by phone today to assist with re-scheduling a follow up visit with the RN Case Manager  Follow up plan: Unsuccessful telephone outreach attempt made. The care management team will reach out to the patient again over the next 7 days. If patient returns call to provider office, please advise to call Embedded Care Management Care Guide Gwenevere Ghazi at [phone].  Gwenevere Ghazi  Care Guide, Embedded Care Coordination [facility] Management

## 2020-10-31 ENCOUNTER — Other Ambulatory Visit: Payer: Self-pay

## 2020-10-31 ENCOUNTER — Other Ambulatory Visit: Payer: Self-pay | Admitting: Family Medicine

## 2020-10-31 ENCOUNTER — Encounter: Payer: Self-pay | Admitting: Family Medicine

## 2020-10-31 DIAGNOSIS — E118 Type 2 diabetes mellitus with unspecified complications: Secondary | ICD-10-CM

## 2020-10-31 MED ORDER — METFORMIN HCL 500 MG PO TABS: ORAL_TABLET | Freq: Two times a day (BID) | ORAL | 2 refills | Status: DC

## 2020-10-31 MED ORDER — INSULIN GLARGINE 100 UNIT/ML SOLOSTAR PEN
25.0000 [IU] | PEN_INJECTOR | SUBCUTANEOUS | 3 refills | Status: DC
  Filled 2020-10-31: qty 15, 60d supply, fill #0

## 2020-11-01 ENCOUNTER — Other Ambulatory Visit: Payer: Self-pay

## 2020-11-04 ENCOUNTER — Telehealth: Payer: Self-pay | Admitting: *Deleted

## 2020-11-04 NOTE — Chronic Care Management (AMB) (Signed)
  Care Management   Note  11/04/2020 Name: [name] MRN: [id number] DOB: February 15, 1961  [name] is a 60 y.o. year old female who is a primary care patient of Shirlean Mylar, MD and is actively engaged with the care management team. I reached out to [name] by phone today to assist with re-scheduling a follow up visit with the RN Case Manager.  Follow up plan: Unsuccessful telephone outreach attempt made.The care management team will reach out to the patient again over the next 7 days. If patient returns call to provider office, please advise to call Embedded Care Management Care Guide Gwenevere Ghazi at [phone]  [facility] Guide, Embedded Care Coordination [facility] Management

## 2020-11-07 NOTE — Progress Notes (Signed)
    SUBJECTIVE:   CHIEF COMPLAINT / HPI:   DM: patient reports that her most recent fasting BGs have been 120 and 145 this week. She is presently using 25 units of lantus daily. She rean out of her insulin about a week ago and did not have it for a week and her sugars were in the 250s at that time. A1c today is 9.2%. She is also on metformin 1000mg  BID. She brought in pay stubs today for patient assistance application.  CP: Patient reports that she has not had further chest pain since discharge in February. She was supposed to follow up with Cardiology outpatient, but could not afford payment ($250-500). She has completed her orange card application, I will try to follow up with social work to learn status of application  PERTINENT  PMH / PSH: DM, microalbuminuria, HLD, HTN  OBJECTIVE:   BP 120/64   Pulse 60   Ht 5\' 4"  (1.626 m)   Wt 277 lb 6 oz (125.8 kg)   SpO2 96%   BMI 47.61 kg/m   Nursing note and vitals reviewed GEN: age-appropriate, WW, resting comfortably in chair, NAD, obese HEENT: NCAT. Sclera without injection or icterus. MMM.  Cardiac: Regular rate and rhythm. Normal S1/S2. No murmurs, rubs, or gallops appreciated. 2+ radial pulses. Lungs: Clear bilaterally to ascultation. No increased WOB, no accessory muscle usage. No w/r/r. Neuro: Alert and at baseline Ext: no edema Psych: Pleasant and appropriate   ASSESSMENT/PLAN:   Type 2 diabetes mellitus with complication (HCC) Improved control. Current Regimen: metformin 1000 mg BID, lantus 25 qAM CBGs: 120-145 fasting Last A1c: 9.2% today from 9.9% in January 2022 Denies polyuria, polydipsia, hypoglycemia  Last Eye Exam: not done due to cost Statin: crestor ACE/ARB: losartan  Plan to add ozempic 0.25 mg sq qweekly when approved for patient assistance. Pharmacy team did teaching for use of medication. Plan after that to titrate ozempic and decrease insulin as able. Next plan to add SGLT2i due to microalbuminuria. Follow  up in 1 month.  Angina pectoris [facility]) Patient without further events. Will f/u orange card application as this will facilitate patient's ability to follow up with cardiology.     February 2022, MD [facility] Health [facility]

## 2020-11-07 NOTE — Patient Instructions (Addendum)
It was a pleasure to see you today!  1. For your diabetes, we will start using ozempic 0.25 mg injection once weekly. We are applying through the patient assistance program and once approved, this will be delivered to the clinic and we will let you know to come pick it up.   2. I will check in with the social work team to see what the status of the orange card is.  3. Make a follow up appointment in 1 month  4. Insulin needles have been sent to [facility]  You have made big improvements with your diabetes! Keep up the good work!  Be Well,  Dr. Leary Roca

## 2020-11-08 ENCOUNTER — Encounter: Payer: Self-pay | Admitting: Family Medicine

## 2020-11-08 ENCOUNTER — Other Ambulatory Visit: Payer: Self-pay

## 2020-11-08 ENCOUNTER — Ambulatory Visit (INDEPENDENT_AMBULATORY_CARE_PROVIDER_SITE_OTHER): Payer: Self-pay | Admitting: Family Medicine

## 2020-11-08 VITALS — BP 120/64 | HR 60 | Ht 64.0 in | Wt 277.4 lb

## 2020-11-08 DIAGNOSIS — E118 Type 2 diabetes mellitus with unspecified complications: Secondary | ICD-10-CM

## 2020-11-08 DIAGNOSIS — I209 Angina pectoris, unspecified: Secondary | ICD-10-CM

## 2020-11-08 LAB — POCT GLYCOSYLATED HEMOGLOBIN (HGB A1C): HbA1c, POC (controlled diabetic range): 9.2 % — AB (ref 0.0–7.0)

## 2020-11-08 MED ORDER — NOVOFINE PEN NEEDLE 32G X 6 MM MISC: 1.0000 | Freq: Every day | 3 refills | Status: DC

## 2020-11-08 NOTE — Assessment & Plan Note (Signed)
Patient without further events. Will f/u orange card application as this will facilitate patient's ability to follow up with cardiology.

## 2020-11-08 NOTE — Assessment & Plan Note (Signed)
Improved control. Current Regimen: metformin 1000 mg BID, lantus 25 qAM CBGs: 120-145 fasting Last A1c: 9.2% today from 9.9% in January 2022 Denies polyuria, polydipsia, hypoglycemia  Last Eye Exam: not done due to cost Statin: crestor ACE/ARB: losartan  Plan to add ozempic 0.25 mg sq qweekly when approved for patient assistance. Pharmacy team did teaching for use of medication. Plan after that to titrate ozempic and decrease insulin as able. Next plan to add SGLT2i due to microalbuminuria. Follow up in 1 month.

## 2020-11-08 NOTE — Progress Notes (Signed)
Submitted application for [facility] AND TRESIBA to NOVO NORDISK for patient assistance.   Phone: [phone]

## 2020-11-08 NOTE — Progress Notes (Signed)
Asked by Dr. Leary Roca to educate patient on Ozempic (semaglutide).  Patient verbalized that she had training in the past.   Patient reeducated on purpose, proper use and potential adverse effects of nausea/vomiting.  Following instruction patient verbalized understanding of treatment plan.

## 2020-11-09 NOTE — Telephone Encounter (Signed)
Pt rescheduled for 5/25 she still wants to be apart of CCM

## 2020-11-09 NOTE — Chronic Care Management (AMB) (Signed)
  Care Management   Note  11/09/2020 Name: TEYANNA THIELMAN MRN: [id number] DOB: 11-28-1960  IVAN MASKELL is a 60 y.o. year old female who is a primary care patient of Shirlean Mylar, MD and is actively engaged with the care management team. I reached out to [name] by phone today to assist with re-scheduling a follow up visit with the RN Case Manager  Follow up plan: Telephone appointment with care management team member scheduled for:11/23/2020  [facility] Guide, Embedded Care Coordination [facility] Management

## 2020-11-15 NOTE — Progress Notes (Signed)
Received notification from N[employer] NORDISK regarding approval for OZEMPIC 2MG /1.5ML &TRESIBA 100-U. Patient assistance approved from 11/11/20 to 11/05/21.  4 MONTH SUPPLY OF MEDICATION IS IN PROCESS OF SH[facility].  Phone: [phone]

## 2020-11-23 ENCOUNTER — Telehealth: Payer: Self-pay

## 2020-11-23 NOTE — Telephone Encounter (Signed)
   RN Case Manager Care Management   Phone Outreach    11/23/2020 Name: [name] MRN: [id number] DOB: August 21, 1960  [name] is a 60 y.o. year old female who is a primary care patient of Shirlean Mylar, MD .   3rd unsuccessful telephone outreach was attempted today. The patient was referred to the case management team for assistance with care management and care coordination. The patient's primary care provider has been notified of our unsuccessful attempts to make or maintain contact with the patient. The care management team is pleased to engage with this patient at any time in the future should he/she be interested in assistance from the care management team. Unable to leave a HIPPA compliant phone message due to voice mail full.   Plan:RNCM will route note to PCP  Review of patient status, including review of consultants reports, relevant laboratory and other test results, and collaboration with appropriate care team members and the patient's provider was performed as part of comprehensive patient evaluation and provision of care management services.    Juanell Fairly RN, BSN, [facility] Care Management Coordinator [facility] Phone: [phone] I Fax: [phone]

## 2020-12-07 ENCOUNTER — Telehealth: Payer: Self-pay | Admitting: Pharmacist

## 2020-12-07 NOTE — Telephone Encounter (Signed)
Called to inform patient that her Novo medications arrived [place], [place], and pen needles).   She has appt with PCP on 6/16 and will pick up medications then.  Instructed patient that Evaristo Bury is REPLACING Lantus and she will not take both of them together. Will also remind PCP to discuss with patient.

## 2020-12-14 NOTE — Progress Notes (Signed)
    SUBJECTIVE:   CHIEF COMPLAINT / HPI: f/u DM2  DM2: patient to pick up tresiba and ozempic today. Most recent fasting BG's at home have been 130s. She reports no hypoglycemic events.   Morbid obesity: Patient has lost 9 lbs in last month, reports has been making an effort and less hungry overall.  HTN: BP at goal today 100/65.  Healthcare maintenance: patient does not have health insurance and cannot afford quality screenings. She reports that she has gathered all necessary parts of orange card application, but has not turned in application.   PERTINENT  PMH / PSH: DM2, TN, BMI 46  OBJECTIVE:   BP 100/65   Pulse 88   Ht 5\' 4"  (1.626 m)   Wt 269 lb 8 oz (122.2 kg)   SpO2 98%   BMI 46.26 kg/m   Nursing note and vitals reviewed GEN: age-appropriate WW, resting comfortably in chair, NAD, obese Cardiac: Regular rate and rhythm. Normal S1/S2. No murmurs, rubs, or gallops appreciated. 2+ radial pulses. Lungs: Clear bilaterally to ascultation. No increased WOB, no accessory muscle usage. No w/r/r.  Neuro: alert and at baseline Ext: no edema Psych: Pleasant and appropriate   ASSESSMENT/PLAN:   Type 2 diabetes mellitus with complication (HCC) Uncontrolled, but improved. Reports fasting BGs in 130s, likely much improved A1c. Patient picked up ozempic and tresiba with pen needles today. Recommend decrease tresiba to 20U day that she starts ozempic. Patient to follow up with Dr. next week. Counseled on hypoglycemia, return precautions given. Follow up with me for repeat A1c in August.  HTN (hypertension) Controlled, BP at goal. Continue cozaar.   Healthcare maintenance Encouraged patient to complete orange card application and turn it in. Cannot do further quality screenings (mammogram, colonoscopy, pap smear, etc) without access to orange card.  Morbid obesity (HCC) Patient lost 9 lbs recently due to diet efforts, congratulated her on achievement. Starting ozempic for DM2,  but also hopefully some benefit for weight loss as well. Current BMI 46.     September, MD [facility]

## 2020-12-15 ENCOUNTER — Other Ambulatory Visit: Payer: Self-pay

## 2020-12-15 ENCOUNTER — Ambulatory Visit (INDEPENDENT_AMBULATORY_CARE_PROVIDER_SITE_OTHER): Payer: Self-pay | Admitting: Family Medicine

## 2020-12-15 DIAGNOSIS — Z0000 Encounter for general adult medical examination without abnormal findings: Secondary | ICD-10-CM

## 2020-12-15 DIAGNOSIS — I10 Essential (primary) hypertension: Secondary | ICD-10-CM

## 2020-12-15 DIAGNOSIS — E118 Type 2 diabetes mellitus with unspecified complications: Secondary | ICD-10-CM

## 2020-12-15 NOTE — Patient Instructions (Addendum)
IIt was a pleasure to see you today!  Start the ozempic on the day of the week you will remember it best. The day you start it, decrease tresiba to 20 units. Then follow up with Dr. Nicholaus Bloom, our pharmacist, on the phone next week. Check your sugars, if they are under 80, call us and let us know. If you have dizziness, shakiness, nausea/vomiting, check your sugar and write down the number.  2. Follow up with me in 2 months to check your A1c  3. Turn in your application for orange card at your earliest convenience and if you need help mailing or making copies, let our office know [phone].  Be Well,  Dr. Leary Roca

## 2020-12-15 NOTE — Assessment & Plan Note (Signed)
Patient lost 9 lbs recently due to diet efforts, congratulated her on achievement. Starting ozempic for DM2, but also hopefully some benefit for weight loss as well. Current BMI 46.

## 2020-12-15 NOTE — Assessment & Plan Note (Signed)
Uncontrolled, but improved. Reports fasting BGs in 130s, likely much improved A1c. Patient picked up ozempic and tresiba with pen needles today. Recommend decrease tresiba to 20U day that she starts ozempic. Patient to follow up with Dr. Nicholaus Bloom next week. Counseled on hypoglycemia, return precautions given. Follow up with me for repeat A1c in August.

## 2020-12-15 NOTE — Assessment & Plan Note (Signed)
Controlled, BP at goal. Continue cozaar.

## 2020-12-15 NOTE — Assessment & Plan Note (Signed)
Encouraged patient to complete orange card application and turn it in. Cannot do further quality screenings (mammogram, colonoscopy, pap smear, etc) without access to orange card.

## 2021-03-11 ENCOUNTER — Other Ambulatory Visit: Payer: Self-pay | Admitting: Family Medicine

## 2021-03-11 DIAGNOSIS — Z794 Long term (current) use of insulin: Secondary | ICD-10-CM

## 2021-03-11 DIAGNOSIS — E0865 Diabetes mellitus due to underlying condition with hyperglycemia: Secondary | ICD-10-CM

## 2021-03-11 DIAGNOSIS — E1129 Type 2 diabetes mellitus with other diabetic kidney complication: Secondary | ICD-10-CM

## 2021-03-16 ENCOUNTER — Telehealth: Payer: Self-pay

## 2021-03-16 NOTE — Telephone Encounter (Signed)
Attempted to contact pt in regards to picking up pap medications (tresiba & ozempic). No voicemail available.   Meds are labeled & ready in med room fridge.   Will attempt call back.

## 2021-05-22 ENCOUNTER — Inpatient Hospital Stay (HOSPITAL_COMMUNITY)
Admission: EM | Admit: 2021-05-22 | Discharge: 2021-05-24 | DRG: 660 | Disposition: A | Discharge status: Home / Self Care | Payer: Self-pay | Attending: Family Medicine | Admitting: Family Medicine

## 2021-05-22 ENCOUNTER — Emergency Department (HOSPITAL_COMMUNITY): Payer: Self-pay

## 2021-05-22 ENCOUNTER — Other Ambulatory Visit: Payer: Self-pay

## 2021-05-22 ENCOUNTER — Encounter (HOSPITAL_COMMUNITY): Payer: Self-pay

## 2021-05-22 DIAGNOSIS — N3001 Acute cystitis with hematuria: Secondary | ICD-10-CM

## 2021-05-22 DIAGNOSIS — E1122 Type 2 diabetes mellitus with diabetic chronic kidney disease: Secondary | ICD-10-CM | POA: Diagnosis present

## 2021-05-22 DIAGNOSIS — N131 Hydronephrosis with ureteral stricture, not elsewhere classified: Secondary | ICD-10-CM

## 2021-05-22 DIAGNOSIS — N390 Urinary tract infection, site not specified: Secondary | ICD-10-CM | POA: Diagnosis present

## 2021-05-22 DIAGNOSIS — F17210 Nicotine dependence, cigarettes, uncomplicated: Secondary | ICD-10-CM | POA: Diagnosis present

## 2021-05-22 DIAGNOSIS — Z20822 Contact with and (suspected) exposure to covid-19: Secondary | ICD-10-CM | POA: Diagnosis present

## 2021-05-22 DIAGNOSIS — Z888 Allergy status to other drugs, medicaments and biological substances status: Secondary | ICD-10-CM

## 2021-05-22 DIAGNOSIS — R9431 Abnormal electrocardiogram [ECG] [EKG]: Secondary | ICD-10-CM

## 2021-05-22 DIAGNOSIS — I872 Venous insufficiency (chronic) (peripheral): Secondary | ICD-10-CM | POA: Diagnosis present

## 2021-05-22 DIAGNOSIS — E669 Obesity, unspecified: Secondary | ICD-10-CM | POA: Diagnosis present

## 2021-05-22 DIAGNOSIS — E1169 Type 2 diabetes mellitus with other specified complication: Secondary | ICD-10-CM | POA: Diagnosis present

## 2021-05-22 DIAGNOSIS — I10 Essential (primary) hypertension: Secondary | ICD-10-CM | POA: Diagnosis present

## 2021-05-22 DIAGNOSIS — Z87442 Personal history of urinary calculi: Secondary | ICD-10-CM

## 2021-05-22 DIAGNOSIS — I130 Hypertensive heart and chronic kidney disease with heart failure and stage 1 through stage 4 chronic kidney disease, or unspecified chronic kidney disease: Secondary | ICD-10-CM | POA: Diagnosis present

## 2021-05-22 DIAGNOSIS — Z7982 Long term (current) use of aspirin: Secondary | ICD-10-CM

## 2021-05-22 DIAGNOSIS — E785 Hyperlipidemia, unspecified: Secondary | ICD-10-CM | POA: Diagnosis present

## 2021-05-22 DIAGNOSIS — N200 Calculus of kidney: Secondary | ICD-10-CM

## 2021-05-22 DIAGNOSIS — Z79899 Other long term (current) drug therapy: Secondary | ICD-10-CM

## 2021-05-22 DIAGNOSIS — N136 Pyonephrosis: Principal | ICD-10-CM | POA: Diagnosis present

## 2021-05-22 DIAGNOSIS — J449 Chronic obstructive pulmonary disease, unspecified: Secondary | ICD-10-CM | POA: Diagnosis present

## 2021-05-22 DIAGNOSIS — Z6841 Body Mass Index (BMI) 40.0 and over, adult: Secondary | ICD-10-CM

## 2021-05-22 DIAGNOSIS — I5032 Chronic diastolic (congestive) heart failure: Secondary | ICD-10-CM | POA: Diagnosis present

## 2021-05-22 DIAGNOSIS — Z90711 Acquired absence of uterus with remaining cervical stump: Secondary | ICD-10-CM

## 2021-05-22 DIAGNOSIS — F319 Bipolar disorder, unspecified: Secondary | ICD-10-CM | POA: Diagnosis present

## 2021-05-22 DIAGNOSIS — N182 Chronic kidney disease, stage 2 (mild): Secondary | ICD-10-CM | POA: Diagnosis present

## 2021-05-22 DIAGNOSIS — Z794 Long term (current) use of insulin: Secondary | ICD-10-CM

## 2021-05-22 LAB — CBC WITH DIFFERENTIAL/PLATELET
Abs Immature Granulocytes: 0.07 10*3/uL (ref 0.00–0.07)
Basophils Absolute: 0.1 10*3/uL (ref 0.0–0.1)
Basophils Relative: 1 %
Eosinophils Absolute: 0.1 10*3/uL (ref 0.0–0.5)
Eosinophils Relative: 1 %
HCT: 49.6 % — ABNORMAL HIGH (ref 36.0–46.0)
Hemoglobin: 16.7 g/dL — ABNORMAL HIGH (ref 12.0–15.0)
Immature Granulocytes: 1 %
Lymphocytes Relative: 18 %
Lymphs Abs: 2.7 10*3/uL (ref 0.7–4.0)
MCH: 31 pg (ref 26.0–34.0)
MCHC: 33.7 g/dL (ref 30.0–36.0)
MCV: 92 fL (ref 80.0–100.0)
Monocytes Absolute: 1.1 10*3/uL — ABNORMAL HIGH (ref 0.1–1.0)
Monocytes Relative: 8 %
Neutro Abs: 10.8 10*3/uL — ABNORMAL HIGH (ref 1.7–7.7)
Neutrophils Relative %: 71 %
Platelets: 321 10*3/uL (ref 150–400)
RBC: 5.39 MIL/uL — ABNORMAL HIGH (ref 3.87–5.11)
RDW: 12.2 % (ref 11.5–15.5)
WBC: 14.9 10*3/uL — ABNORMAL HIGH (ref 4.0–10.5)
nRBC: 0 % (ref 0.0–0.2)

## 2021-05-22 LAB — RESP PANEL BY RT-PCR (FLU A&B, COVID) ARPGX2
Influenza A by PCR: NEGATIVE
Influenza B by PCR: NEGATIVE
SARS Coronavirus 2 by RT PCR: NEGATIVE

## 2021-05-22 LAB — URINALYSIS, ROUTINE W REFLEX MICROSCOPIC
Bilirubin Urine: NEGATIVE
Glucose, UA: NEGATIVE mg/dL
Ketones, ur: 20 mg/dL — AB
Nitrite: POSITIVE — AB
Protein, ur: NEGATIVE mg/dL
RBC / HPF: >50 RBC/hpf — ABNORMAL HIGH (ref 0–5)
Specific Gravity, Urine: 1.045 — ABNORMAL HIGH (ref 1.005–1.030)
WBC, UA: >50 WBC/hpf — ABNORMAL HIGH (ref 0–5)
pH: 6 (ref 5.0–8.0)

## 2021-05-22 LAB — COMPREHENSIVE METABOLIC PANEL
ALT: 20 U/L (ref 0–44)
AST: 25 U/L (ref 15–41)
Albumin: 4.3 g/dL (ref 3.5–5.0)
Alkaline Phosphatase: 102 U/L (ref 38–126)
Anion gap: 10 (ref 5–15)
BUN: 17 mg/dL (ref 6–20)
CO2: 24 mmol/L (ref 22–32)
Calcium: 9.6 mg/dL (ref 8.9–10.3)
Chloride: 107 mmol/L (ref 98–111)
Creatinine, Ser: 0.96 mg/dL (ref 0.44–1.00)
GFR, Estimated: >60 mL/min (ref >60)
Glucose, Bld: 85 mg/dL (ref 70–99)
Potassium: 3.6 mmol/L (ref 3.5–5.1)
Sodium: 141 mmol/L (ref 135–145)
Total Bilirubin: 0.8 mg/dL (ref 0.3–1.2)
Total Protein: 8.1 g/dL (ref 6.5–8.1)

## 2021-05-22 LAB — RAPID URINE DRUG SCREEN, HOSP PERFORMED
Amphetamines: NOT DETECTED
Barbiturates: NOT DETECTED
Benzodiazepines: POSITIVE — AB
Cocaine: NOT DETECTED
Opiates: NOT DETECTED
Tetrahydrocannabinol: POSITIVE — AB

## 2021-05-22 LAB — ETHANOL: Alcohol, Ethyl (B): <10 mg/dL (ref <10)

## 2021-05-22 LAB — LACTIC ACID, PLASMA
Lactic Acid, Venous: 2 mmol/L (ref 0.5–1.9)
Lactic Acid, Venous: 1.4 mmol/L (ref 0.5–1.9)

## 2021-05-22 LAB — MAGNESIUM: Magnesium: 1.8 mg/dL (ref 1.7–2.4)

## 2021-05-22 LAB — TROPONIN I (HIGH SENSITIVITY)
Troponin I (High Sensitivity): 10 ng/L (ref <18)
Troponin I (High Sensitivity): 10 ng/L (ref <18)

## 2021-05-22 MED ORDER — SODIUM CHLORIDE 0.9 % IV BOLUS
1000.0000 mL | Freq: Once | INTRAVENOUS | Status: AC
  Administered 2021-05-22: 1000 mL via INTRAVENOUS

## 2021-05-22 MED ORDER — SODIUM CHLORIDE 0.9 % IV SOLN
1.0000 g | Freq: Once | INTRAVENOUS | Status: AC
  Administered 2021-05-22: 1 g via INTRAVENOUS
  Filled 2021-05-22: qty 10

## 2021-05-22 MED ORDER — IOHEXOL 350 MG/ML SOLN
80.0000 mL | Freq: Once | INTRAVENOUS | Status: AC | PRN
  Administered 2021-05-22: 80 mL via INTRAVENOUS

## 2021-05-22 MED ORDER — FENTANYL CITRATE (PF) 100 MCG/2ML IJ SOLN
INTRAMUSCULAR | Status: AC
  Filled 2021-05-22: qty 2

## 2021-05-22 MED ORDER — PROPOFOL 10 MG/ML IV BOLUS
INTRAVENOUS | Status: AC
  Filled 2021-05-22: qty 40

## 2021-05-22 MED ORDER — MAGNESIUM SULFATE 50 % IJ SOLN
1.0000 g | Freq: Once | INTRAMUSCULAR | Status: DC
Start: 2021-05-22 — End: 2021-05-22

## 2021-05-22 MED ORDER — MAGNESIUM SULFATE IN D5W 1-5 GM/100ML-% IV SOLN
1.0000 g | Freq: Once | INTRAVENOUS | Status: DC
  Filled 2021-05-22: qty 100

## 2021-05-22 MED ORDER — POTASSIUM CHLORIDE 10 MEQ/100ML IV SOLN: 10.0000 meq | Freq: Once | INTRAVENOUS | Status: DC

## 2021-05-22 NOTE — ED Triage Notes (Signed)
Pt brought in by ems for falls and weakness.  Pt states she has been feeling "sick" for since Saturday. Pt states she feel onto the ground between 9-10am and wasn't able to get up. Pt's daughter came to check on pt this evening and found pt on floor, called 911. Pt's bgl 98 w/ ems. Pt's rectal temp upon arrival 95.1.  MD to room during triage.

## 2021-05-22 NOTE — H&P (View-Only) (Signed)
I have been asked to see the patient by Dr. Norman Clay, for evaluation and management of left ureteral calculus with hydronephrosis.  History of present illness: 60 yo woman with with hx of DM and kidney stones followed by [facility] was found down at her house earlier today by her daughter.  Her daughter states patient has had a cold for the last few days and not felt well.  In the ED she was found to have a lactic acid of 2.5, WBC of 14.9, and CT scan showing a 3 mm left proximal ureteral calculus with mild hydronephrosis and additional bilateral non-obstructing stones.  Urinalysis concerning for possible infection.  Patient has had stents in the past and tolerated them well.  Review of systems: A 12 point comprehensive review of systems was obtained and is negative unless otherwise stated in the history of present illness.  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   Current Outpatient Medications on File Prior to Encounter  Medication Sig Dispense Refill   acetaminophen (TYLENOL) 500 MG tablet Take 1,000 mg by mouth every 6 (six) hours as needed for mild pain.     aspirin 81 MG EC tablet Take 81 mg by mouth every morning.     Insulin Degludec (TRESIBA FLEXTOUCH [place]) Inject 3 Units into the skin once a week. Wednesdays     insulin glargine (LANTUS) 100 UNIT/ML Solostar Pen Inject 25 Units into the skin every morning. 15 mL 3   losartan (COZAAR) 50 MG tablet TAKE ONE TABLET BY MOUTH DAILY (Patient taking differently: Take 50 mg by mouth daily.) 30 tablet 6   Multiple Vitamin (MULTIVITAMIN WITH MINERALS) TABS Take 1 tablet by mouth every morning.     nitroGLYCERIN (NITROSTAT) 0.4 MG SL tablet Place 1 tablet (0.4 mg total) under the tongue every 5 (five) minutes as needed for chest pain. If after 1 tablet and 5 minutes the chest pain is still present, go to emergency room. 25 tablet 12   rosuvastatin (CRESTOR) 10 MG tablet TAKE ONE TABLET BY MOUTH DAILY (Patient taking differently: Take 10 mg by mouth daily.) 90 tablet 3   hydrocortisone cream 0.5 % Apply 1 application topically 2 (two) times daily. (Patient not taking: Reported on 10/19/2020) 30 g 0   Insulin Pen Needle (NOVOFINE PEN NEEDLE) 32G X 6 MM MISC 1 each by Does not apply route daily. 100 each 3   metFORMIN (GLUCOPHAGE) 500 MG tablet TAKE 2 TABLETS (1,000 MG TOTAL) BY MOUTH 2 (  TWO) TIMES DAILY WITH A MEAL. (Patient not taking: Reported on 05/22/2021) 60 tablet 2    Past Medical History:  Diagnosis Date   BACK PAIN, LOW 08/29/2006   CHF (congestive heart failure) (HCC)    DEPRESSION, MAJOR, RECURRENT 08/29/2006   DISORDER, BIPOLAR NOS 09/09/2006   DMII (diabetes mellitus, type 2) (HCC) 09/19/2010   Hyperlipidemia 09/19/2010   LDL 78 on 08/2010. Recheck 08/2011.     HYPERTENSION, BENIGN SYSTEMIC 08/29/2006   Well controlled on Lopressor and lisinopril. Check BMET 12/2011     Kidney  infection    OVARIAN MASS 09/18/2007   Benign ovarian cyst s/p laparotomy for removal    TOBACCO DEPENDENCE 08/29/2006   Patient quit in early months of 2012. Continue to follow for possible relapse.     Urosepsis 09/27/2012    Past Surgical History:  Procedure Laterality Date   PARTIAL HYSTERECTOMY     unsure if had cervix removed.    stents Bilateral    Kidneys    Social History   Tobacco Use   Smoking status: Every Day    Packs/day: 1.00    Types: Cigarettes   Smokeless tobacco: Never  Vaping Use   Vaping Use: Never used  Substance Use Topics   Alcohol use: Not Currently   Drug use: No    History reviewed. No pertinent family history.  PE: Vitals:   05/22/21 2130 05/22/21 2235 05/22/21 2245 05/22/21 2300  BP: (!) 163/92 (!) 176/79  (!) 155/77  Pulse: 88 86 89   Resp: 15 16 20 (!) 23  Temp:      TempSrc:      SpO2: 97% 98% 96%   Weight:      Height:       Patient appears to be in no acute distress  patient is alert and oriented x3 Atraumatic normocephalic head No increased work of breathing, no audible wheezes/rhonchi Regular sinus rhythm/rate Abdomen is soft, nontender, nondistended, no CVA or suprapubic tenderness Lower extremities are symmetric  Grossly neurologically intact No identifiable skin lesions  Recent Labs    05/22/21 1857  WBC 14.9*  HGB 16.7*  HCT 49.6*   Recent Labs    05/22/21 1857  NA 141  K 3.6  CL 107  CO2 24  GLUCOSE 85  BUN 17  CREATININE 0.96  CALCIUM 9.6   No results for input(s): LABPT, INR in the last 72 hours. No results for input(s): LABURIN in the last 72 hours. Results for orders placed or performed during the hospital encounter of 05/22/21  Resp Panel by RT-PCR (Flu A&B, Covid) Nasopharyngeal Swab     Status: None   Collection Time: 05/22/21  6:57 PM   Specimen: Nasopharyngeal Swab; Nasopharyngeal(NP) swabs in vial transport medium  Result Value Ref Range Status   SARS Coronavirus 2 by RT PCR NEGATIVE  NEGATIVE Final    Comment: (NOTE) SARS-CoV-2 target nucleic acids are NOT DETECTED.  The SARS-CoV-2 RNA is generally detectable in upper respiratory specimens during the acute phase of infection. The lowest concentration of SARS-CoV-2 viral copies this assay can detect is 138 copies/mL. A negative result does not preclude SARS-Cov-2 infection and should not be used as the sole basis for treatment or other patient management decisions. A negative result may occur with  improper specimen collection/handling, submission of specimen other than nasopharyngeal swab, presence of viral mutation(s) within the areas targeted by this assay, and inadequate number of viral copies(<138 copies/mL). A negative result must be combined with clinical   observations, patient history, and epidemiological information. The expected result is Negative.  Fact Sheet for Patients:  EntrepreneurPulse.com.au  Fact Sheet for Healthcare Providers:  IncredibleEmployment.be  This test is no t yet approved or cleared by the [place] FDA and  has been authorized for detection and/or diagnosis of SARS-CoV-2 by FDA under an Emergency Use Authorization (EUA). This EUA will remain  in effect (meaning this test can be used) for the duration of the COVID-19 declaration under Section 564(b)(1) of the Act, 21 U.S.C.section 360bbb-3(b)(1), unless the authorization is terminated  or revoked sooner.       Influenza A by PCR NEGATIVE NEGATIVE Final   Influenza B by PCR NEGATIVE NEGATIVE Final    Comment: (NOTE) The Xpert Xpress SARS-CoV-2/FLU/RSV plus assay is intended as an aid in the diagnosis of influenza from Nasopharyngeal swab specimens and should not be used as a sole basis for treatment. Nasal washings and aspirates are unacceptable for Xpert Xpress SARS-CoV-2/FLU/RSV testing.  Fact Sheet for Patients: EntrepreneurPulse.com.au  Fact Sheet for Healthcare  Providers: IncredibleEmployment.be  This test is not yet approved or cleared by the [place] FDA and has been authorized for detection and/or diagnosis of SARS-CoV-2 by FDA under an Emergency Use Authorization (EUA). This EUA will remain in effect (meaning this test can be used) for the duration of the COVID-19 declaration under Section 564(b)(1) of the Act, 21 U.S.C. section 360bbb-3(b)(1), unless the authorization is terminated or revoked.  Performed at [facility], [place] [address], [place], [place] [address]     Imaging: CT Abd/Pelvis 05/22/21 IMPRESSION: 1. Mild left hydronephrosis, secondary to a 3 mm stone at the left UPJ. Multiple bilateral intrarenal stones including nonobstructing 6 mm stone in left renal pelvis. There is multifocal cortical scarring within the bilateral kidneys. 2. Hepatic steatosis     Electronically Signed   By: Donavan Foil M.D.   On: 05/22/2021 21:18  Impression/Recommendations: Left 3 mm UPJ calculus with UTI -Discussed CT scan findings in setting of early infection.  Recommend proceeding with cystoscopy and left ureteral stent placement -Admit to medicine for treatment of infection -Patient will need staged ureteroscopy to definitively remove stone at a later date (per daughter patient does not have insurance has been seeing [facility] urology in the past) -Risks and benefits of left ureteral stent placement discussed with patient and informed consent obtained -surgery tonight  ge with any further questions or concerns. Katanya Schlie D Gurtaj Ruz

## 2021-05-22 NOTE — ED Notes (Signed)
Bear hugger applied 

## 2021-05-22 NOTE — Consult Note (Addendum)
I have been asked to see the patient by Dr. Norman Clay, for evaluation and management of left ureteral calculus with hydronephrosis.  History of present illness: 60 yo woman with with hx of DM and kidney stones followed by [facility] was found down at her house earlier today by her daughter.  Her daughter states patient has had a cold for the last few days and not felt well.  In the ED she was found to have a lactic acid of 2.5, WBC of 14.9, and CT scan showing a 3 mm left proximal ureteral calculus with mild hydronephrosis and additional bilateral non-obstructing stones.  Urinalysis concerning for possible infection.  Patient has had stents in the past and tolerated them well.  Review of systems: A 12 point comprehensive review of systems was obtained and is negative unless otherwise stated in the history of present illness.  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   Current Outpatient Medications on File Prior to Encounter  Medication Sig Dispense Refill   acetaminophen (TYLENOL) 500 MG tablet Take 1,000 mg by mouth every 6 (six) hours as needed for mild pain.     aspirin 81 MG EC tablet Take 81 mg by mouth every morning.     Insulin Degludec (TRESIBA FLEXTOUCH [place]) Inject 3 Units into the skin once a week. Wednesdays     insulin glargine (LANTUS) 100 UNIT/ML Solostar Pen Inject 25 Units into the skin every morning. 15 mL 3   losartan (COZAAR) 50 MG tablet TAKE ONE TABLET BY MOUTH DAILY (Patient taking differently: Take 50 mg by mouth daily.) 30 tablet 6   Multiple Vitamin (MULTIVITAMIN WITH MINERALS) TABS Take 1 tablet by mouth every morning.     nitroGLYCERIN (NITROSTAT) 0.4 MG SL tablet Place 1 tablet (0.4 mg total) under the tongue every 5 (five) minutes as needed for chest pain. If after 1 tablet and 5 minutes the chest pain is still present, go to emergency room. 25 tablet 12   rosuvastatin (CRESTOR) 10 MG tablet TAKE ONE TABLET BY MOUTH DAILY (Patient taking differently: Take 10 mg by mouth daily.) 90 tablet 3   hydrocortisone cream 0.5 % Apply 1 application topically 2 (two) times daily. (Patient not taking: Reported on 10/19/2020) 30 g 0   Insulin Pen Needle (NOVOFINE PEN NEEDLE) 32G X 6 MM MISC 1 each by Does not apply route daily. 100 each 3   metFORMIN (GLUCOPHAGE) 500 MG tablet TAKE 2 TABLETS (1,000 MG TOTAL) BY MOUTH 2 (  TWO) TIMES DAILY WITH A MEAL. (Patient not taking: Reported on 05/22/2021) 60 tablet 2    Past Medical History:  Diagnosis Date   BACK PAIN, LOW 08/29/2006   CHF (congestive heart failure) ([place])    DEPRESSION, MAJOR, RECURRENT 08/29/2006   DISORDER, BIPOLAR NOS 09/09/2006   DMII (diabetes mellitus, type 2) ([place]) 09/19/2010   Hyperlipidemia 09/19/2010   LDL 78 on 08/2010. Recheck 08/2011.     HYPERTENSION, BENIGN SYSTEMIC 08/29/2006   Well controlled on Lopressor and lisinopril. Check BMET 12/2011     Kidney  infection    OVARIAN MASS 09/18/2007   Benign ovarian cyst s/p laparotomy for removal    TOBACCO DEPENDENCE 08/29/2006   Patient quit in early months of 2012. Continue to follow for possible relapse.     Urosepsis 09/27/2012    Past Surgical History:  Procedure Laterality Date   PARTIAL HYSTERECTOMY     unsure if had cervix removed.    stents Bilateral    Kidneys    Social History   Tobacco Use   Smoking status: Every Day    Packs/day: 1.00    Types: Cigarettes   Smokeless tobacco: Never  Vaping Use   Vaping Use: Never used  Substance Use Topics   Alcohol use: Not Currently   Drug use: No    History reviewed. No pertinent family history.  PE: Vitals:   05/22/21 2130 05/22/21 2235 05/22/21 2245 05/22/21 2300  BP: (!) 163/92 (!) 176/79  (!) 155/77  Pulse: 88 86 89   Resp: 15 16 20  (!) 23  Temp:      TempSrc:      SpO2: 97% 98% 96%   Weight:      Height:       Patient appears to be in no acute distress  patient is alert and oriented x3 Atraumatic normocephalic head No increased work of breathing, no audible wheezes/rhonchi Regular sinus rhythm/rate Abdomen is soft, nontender, nondistended, no CVA or suprapubic tenderness Lower extremities are symmetric  Grossly neurologically intact No identifiable skin lesions  Recent Labs    05/22/21 1857  WBC 14.9*  HGB 16.7*  HCT 49.6*   Recent Labs    05/22/21 1857  NA 141  K 3.6  CL 107  CO2 24  GLUCOSE 85  BUN 17  CREATININE 0.96  CALCIUM 9.6   No results for input(s): LABPT, INR in the last 72 hours. No results for input(s): LABURIN in the last 72 hours. Results for orders placed or performed during the hospital encounter of 05/22/21  Resp Panel by RT-PCR (Flu A&B, Covid) Nasopharyngeal Swab     Status: None   Collection Time: 05/22/21  6:57 PM   Specimen: Nasopharyngeal Swab; Nasopharyngeal(NP) swabs in vial transport medium  Result Value Ref Range Status   SARS Coronavirus 2 by RT PCR NEGATIVE  NEGATIVE Final    Comment: (NOTE) SARS-CoV-2 target nucleic acids are NOT DETECTED.  The SARS-CoV-2 RNA is generally detectable in upper respiratory specimens during the acute phase of infection. The lowest concentration of SARS-CoV-2 viral copies this assay can detect is 138 copies/mL. A negative result does not preclude SARS-Cov-2 infection and should not be used as the sole basis for treatment or other patient management decisions. A negative result may occur with  improper specimen collection/handling, submission of specimen other than nasopharyngeal swab, presence of viral mutation(s) within the areas targeted by this assay, and inadequate number of viral copies(<138 copies/mL). A negative result must be combined with clinical  observations, patient history, and epidemiological information. The expected result is Negative.  Fact Sheet for Patients:  EntrepreneurPulse.com.au  Fact Sheet for Healthcare Providers:  IncredibleEmployment.be  This test is no t yet approved or cleared by the [place] FDA and  has been authorized for detection and/or diagnosis of SARS-CoV-2 by FDA under an Emergency Use Authorization (EUA). This EUA will remain  in effect (meaning this test can be used) for the duration of the COVID-19 declaration under Section 564(b)(1) of the Act, 21 U.S.C.section 360bbb-3(b)(1), unless the authorization is terminated  or revoked sooner.       Influenza A by PCR NEGATIVE NEGATIVE Final   Influenza B by PCR NEGATIVE NEGATIVE Final    Comment: (NOTE) The Xpert Xpress SARS-CoV-2/FLU/RSV plus assay is intended as an aid in the diagnosis of influenza from Nasopharyngeal swab specimens and should not be used as a sole basis for treatment. Nasal washings and aspirates are unacceptable for Xpert Xpress SARS-CoV-2/FLU/RSV testing.  Fact Sheet for Patients: EntrepreneurPulse.com.au  Fact Sheet for Healthcare  Providers: IncredibleEmployment.be  This test is not yet approved or cleared by the [place] FDA and has been authorized for detection and/or diagnosis of SARS-CoV-2 by FDA under an Emergency Use Authorization (EUA). This EUA will remain in effect (meaning this test can be used) for the duration of the COVID-19 declaration under Section 564(b)(1) of the Act, 21 U.S.C. section 360bbb-3(b)(1), unless the authorization is terminated or revoked.  Performed at [facility], [place] [address], [place], [place] [address]     Imaging: CT Abd/Pelvis 05/22/21 IMPRESSION: 1. Mild left hydronephrosis, secondary to a 3 mm stone at the left UPJ. Multiple bilateral intrarenal stones including nonobstructing 6 mm stone in left renal pelvis. There is multifocal cortical scarring within the bilateral kidneys. 2. Hepatic steatosis     Electronically Signed   By: Donavan Foil M.D.   On: 05/22/2021 21:18  Impression/Recommendations: Left 3 mm UPJ calculus with UTI -Discussed CT scan findings in setting of early infection.  Recommend proceeding with cystoscopy and left ureteral stent placement -Admit to medicine for treatment of infection -Patient will need staged ureteroscopy to definitively remove stone at a later date (per daughter patient does not have insurance has been seeing [facility] urology in the past) -Risks and benefits of left ureteral stent placement discussed with patient and informed consent obtained -surgery tonight  ge with any further questions or concerns. Jennings Corado D Avneet Ashmore

## 2021-05-22 NOTE — ED Notes (Signed)
Pt to MRI

## 2021-05-22 NOTE — ED Provider Notes (Signed)
[facility]-EMERGENCY DEPT Provider Note   CSN: [id number] Arrival date & time: 05/22/21  1842     History Chief Complaint  Patient presents with   Fall   Weakness    [name] is a 60 y.o. female.  Patient presents with chief complaint of generalized weakness and tremors malaise.  Symptoms ongoing for about 2 days per family.  They had last spoken with the patient last night.  Patient states that she was too weak to get up from the ground this morning.  She reportedly had a fall earlier this morning and stayed on the ground.  Family came to check on her tonight found her on the ground and brought to the ER.  No reports of any cough or fever.  Patient complains of chronic intermittent lower back pain.  Denies chest pain or headache.  Family states that she is been acting more confused in the last 2 to 3 days.      Past Medical History:  Diagnosis Date   BACK PAIN, LOW 08/29/2006   CHF (congestive heart failure) (HCC)    DEPRESSION, MAJOR, RECURRENT 08/29/2006   DISORDER, BIPOLAR NOS 09/09/2006   DMII (diabetes mellitus, type 2) (HCC) 09/19/2010   Hyperlipidemia 09/19/2010   LDL 78 on 08/2010. Recheck 08/2011.     HYPERTENSION, BENIGN SYSTEMIC 08/29/2006   Well controlled on Lopressor and lisinopril. Check BMET 12/2011     Kidney infection    OVARIAN MASS 09/18/2007   Benign ovarian cyst s/p laparotomy for removal    TOBACCO DEPENDENCE 08/29/2006   Patient quit in early months of 2012. Continue to follow for possible relapse.     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    stents Bilateral    Kidneys     OB History   No obstetric history on file.     History reviewed. No pertinent family history.  Social History   Tobacco Use   Smoking status: Every Day    Packs/day: 1.00    Types: Cigarettes   Smokeless tobacco: Never  Vaping Use   Vaping Use: Never used  Substance Use Topics   Alcohol use: Not Currently   Drug use: No    Home Medications Prior to Admission medications   Medication Sig Start Date End Date Taking? Authorizing Provider  acetaminophen (TYLENOL) 500 MG tablet Take 1,000 mg by mouth every 6 (  six) hours as needed for mild pain.   Yes [provider]  aspirin 81 MG EC tablet Take 81 mg by mouth every morning.   Yes [provider]  Insulin Degludec (TRESIBA FLEXTOUCH ) Inject 3 Units into the skin once a week. Wednesdays   Yes [provider]  insulin glargine (LANTUS) 100 UNIT/ML Solostar Pen Inject 25 Units into the skin every morning. 10/31/20  Yes Shirlean MylarMahoney, Caitlin, MD  losartan (COZAAR) 50 MG tablet TAKE ONE TABLET BY MOUTH DAILY Patient taking differently: Take 50 mg by mouth daily. 03/14/21  Yes Shirlean MylarMahoney, Caitlin, MD  Multiple Vitamin (MULTIVITAMIN WITH MINERALS) TABS Take 1 tablet by  mouth every morning.   Yes [provider]  nitroGLYCERIN (NITROSTAT) 0.4 MG SL tablet Place 1 tablet (0.4 mg total) under the tongue every 5 (five) minutes as needed for chest pain. If after 1 tablet and 5 minutes the chest pain is still present, go to emergency room. 08/23/20  Yes Shirlean MylarMahoney, Caitlin, MD  rosuvastatin (CRESTOR) 10 MG tablet TAKE ONE TABLET BY MOUTH DAILY Patient taking differently: Take 10 mg by mouth daily. 03/14/21  Yes Shirlean MylarMahoney, Caitlin, MD  hydrocortisone cream 0.5 % Apply 1 application topically 2 (two) times daily. Patient not taking: Reported on 10/19/2020 10/21/17   Latrelle DodrillMcIntyre, Brittany J, MD  Insulin Pen Needle (NOVOFINE PEN NEEDLE) 32G X 6 MM MISC 1 each by Does not apply route daily. 11/08/20   Shirlean MylarMahoney, Caitlin, MD  metFORMIN (GLUCOPHAGE) 500 MG tablet TAKE 2 TABLETS (1,000 MG TOTAL) BY MOUTH 2 (TWO) TIMES DAILY WITH A MEAL. Patient not taking: Reported on 05/22/2021 10/31/20 10/31/21  Shirlean MylarMahoney, Caitlin, MD    Allergies    Tamsulosin  Review of Systems   Review of Systems  Constitutional:  Negative for fever.  HENT:  Negative for ear pain.   Eyes:  Negative for pain.  Respiratory:  Negative for cough.   Cardiovascular:  Negative for chest pain.  Gastrointestinal:  Negative for abdominal pain.  Genitourinary:  Negative for flank pain.  Musculoskeletal:  Positive for back pain.  Skin:  Negative for rash.  Neurological:  Negative for headaches.   Physical Exam Updated Vital Signs BP (!) 160/80   Pulse 92   Temp (!) 95.1 F (35.1 C) (Rectal)   Resp 17   Ht 5\' 4"  (1.626 m)   Wt 117.9 kg   SpO2 95%   BMI 44.63 kg/m   Physical Exam Constitutional:      General: She is not in acute distress.    Appearance: Normal appearance.  HENT:     Head: Normocephalic.     Nose: Nose normal.  Eyes:     Extraocular Movements: Extraocular movements intact.  Cardiovascular:     Rate and Rhythm: Normal rate.  Pulmonary:     Effort: Pulmonary effort is normal.   Musculoskeletal:        General: Normal range of motion.     Cervical back: Normal range of motion.  Neurological:     General: No focal deficit present.     Mental Status: She is alert and oriented to person, place, and time. Mental status is at baseline.     Cranial Nerves: No cranial nerve deficit.     Comments: Patient slightly slow to respond.  Has repetitive questioning.  Otherwise alert to person place and time.    ED Results / Procedures / Treatments   Labs (all labs ordered are listed, but only abnormal results are displayed) Labs  Reviewed  CBC WITH DIFFERENTIAL/PLATELET - Abnormal; Notable for the following components:      Result Value   WBC 14.9 (*)    RBC 5.39 (*)    Hemoglobin 16.7 (*)    HCT 49.6 (*)    Neutro Abs 10.8 (*)    Monocytes Absolute 1.1 (*)    All other components within normal limits  LACTIC ACID, PLASMA - Abnormal; Notable for the following components:   Lactic Acid, Venous 2.0 (*)    All other components within normal limits  URINALYSIS, ROUTINE W REFLEX MICROSCOPIC - Abnormal; Notable for the following components:   APPearance HAZY (*)    Specific Gravity, Urine 1.045 (*)    Hgb urine dipstick LARGE (*)    Ketones, ur 20 (*)    Nitrite POSITIVE (*)    Leukocytes,Ua LARGE (*)    RBC / HPF >50 (*)    WBC, UA >50 (*)    Bacteria, UA RARE (*)    All other components within normal limits  RAPID URINE DRUG SCREEN, HOSP PERFORMED - Abnormal; Notable for the following components:   Benzodiazepines POSITIVE (*)    Tetrahydrocannabinol POSITIVE (*)    All other components within normal limits  RESP PANEL BY RT-PCR (FLU A&B, COVID) ARPGX2  CULTURE, BLOOD (ROUTINE X 2)  CULTURE, BLOOD (ROUTINE X 2)  COMPREHENSIVE METABOLIC PANEL  LACTIC ACID, PLASMA  ETHANOL  MAGNESIUM  TROPONIN I (HIGH SENSITIVITY)  TROPONIN I (HIGH SENSITIVITY)    EKG EKG Interpretation  Date/Time:  Monday May 22 2021 22:55:10 EST Ventricular Rate:  89 PR  Interval:  157 QRS Duration: 97 QT Interval:  423 QTC Calculation: 515 R Axis:   74 Text Interpretation: Sinus rhythm Prolonged QT interval Confirmed by Thamas Jaegers ([id number]) on 05/22/2021 10:57:35 PM  Radiology CT Head Wo Contrast  Result Date: 05/22/2021 CLINICAL DATA:  Dizziness EXAM: CT HEAD WITHOUT CONTRAST TECHNIQUE: Contiguous axial images were obtained from the base of the skull through the vertex without intravenous contrast. COMPARISON:  MRI report 09/30/2003 FINDINGS: Brain: No evidence of acute infarction, hemorrhage, hydrocephalus, extra-axial collection or mass lesion/mass effect. Vascular: No hyperdense vessel or unexpected calcification. Mild carotid vascular calcification Skull: Normal. Negative for fracture or focal lesion. Sinuses/Orbits: No acute finding. Other: None IMPRESSION: Negative non contrasted CT appearance of the brain Electronically Signed   By: Donavan Foil M.D.   On: 05/22/2021 21:08   MR BRAIN WO CONTRAST  Result Date: 05/22/2021 CLINICAL DATA:  Neuro deficit, stroke suspected EXAM: MRI HEAD WITHOUT CONTRAST TECHNIQUE: Multiplanar, multiecho pulse sequences of the brain and surrounding structures were obtained without intravenous contrast. COMPARISON:  09/30/2003 MRI brain. Correlation is also made with CT head 05/22/2021. FINDINGS: Evaluation is somewhat limited by motion artifact. Brain: No restricted diffusion to suggest acute or subacute infarct. No acute hemorrhage, mass, mass effect, or midline shift. No hydrocephalus or extra-axial collection. T2 hyperintense signal in the periventricular white matter, likely the sequela of chronic small vessel ischemic disease. No foci of hemosiderin deposition to suggest remote hemorrhage. Vascular: Normal flow voids. Skull and upper cervical spine: Normal marrow signal. Evaluation of the cervical spine is limited by motion artifact. Sinuses/Orbits: Negative. Other: Fluid in the bilateral mastoid air cells. IMPRESSION: No  acute intracranial process. No evidence of acute or subacute infarct. Electronically Signed   By: Merilyn Baba M.D.   On: 05/22/2021 22:25   CT ABDOMEN PELVIS W CONTRAST  Result Date: 05/22/2021 CLINICAL DATA:  Trauma fall with flank pain  EXAM: CT ABDOMEN AND PELVIS WITH CONTRAST TECHNIQUE: Multidetector CT imaging of the abdomen and pelvis was performed using the standard protocol following bolus administration of intravenous contrast. CONTRAST:  86mL OMNIPAQUE IOHEXOL 350 MG/ML SOLN COMPARISON:  CT 12/12/2019 FINDINGS: Lower chest: Lung bases demonstrate patchy ground-glass opacity and mosaicism. This may be due to small airways disease. Atelectasis or scarring at the right middle lobe. No pleural effusion. Hepatobiliary: Status post cholecystectomy. No focal hepatic abnormality. Stable mild enlargement of extrahepatic common bile duct. Hepatic steatosis Pancreas: Unremarkable. No pancreatic ductal dilatation or surrounding inflammatory changes. Spleen: Normal in size without focal abnormality. Adrenals/Urinary Tract: Adrenal glands are normal. Multifocal cortical scarring within the bilateral kidneys. There are multiple intrarenal stones bilaterally. There is mild hydronephrosis of the left kidney, secondary to a 3 mm stone at the left UPJ. There is an additional 6 mm nonobstructing stone in the left renal pelvis. Urinary bladder unremarkable Stomach/Bowel: Stomach nonenlarged. No dilated small bowel. Negative appendix. No acute bowel wall thickening. Vascular/Lymphatic: Moderate aortic atherosclerosis. No aneurysm. No suspicious nodes Reproductive: Uterus and bilateral adnexa are unremarkable. Other: Negative for pelvic effusion or free air. Musculoskeletal: No acute osseous abnormality. IMPRESSION: 1. Mild left hydronephrosis, secondary to a 3 mm stone at the left UPJ. Multiple bilateral intrarenal stones including nonobstructing 6 mm stone in left renal pelvis. There is multifocal cortical scarring  within the bilateral kidneys. 2. Hepatic steatosis Electronically Signed   By: Donavan Foil M.D.   On: 05/22/2021 21:18   DG Chest Portable 1 View  Result Date: 05/22/2021 CLINICAL DATA:  Fall, altered mental status. Fall at home unable to get up. EXAM: PORTABLE CHEST 1 VIEW COMPARISON:  08/26/2020 FINDINGS: The cardiomediastinal contours are unchanged, upper normal heart size likely accentuated by technique. Chronic bronchitic change. Pulmonary vasculature is normal. No consolidation, pleural effusion, or pneumothorax. No acute osseous abnormalities are seen. IMPRESSION: No acute process.  Chronic bronchial thickening. Electronically Signed   By: Keith Rake M.D.   On: 05/22/2021 19:31    Procedures Procedures   Medications Ordered in ED Medications  magnesium sulfate IVPB 1 g 100 mL (has no administration in time range)  potassium chloride 10 mEq in 100 mL IVPB (has no administration in time range)  sodium chloride 0.9 % bolus 1,000 mL (0 mLs Intravenous Stopped 05/22/21 2204)  iohexol (OMNIPAQUE) 350 MG/ML injection 80 mL (80 mLs Intravenous Contrast Given 05/22/21 2052)  cefTRIAXone (ROCEPHIN) 1 g in sodium chloride 0.9 % 100 mL IVPB (1 g Intravenous New Bag/Given 05/22/21 2322)    ED Course  I have reviewed the triage vital signs and the nursing notes.  Pertinent labs & imaging results that were available during my care of the patient were reviewed by me and considered in my medical decision making (see chart for details).    MDM Rules/Calculators/A&P                           Initially mildly hypothermic.  Sepsis work-up initiated.  Lactic acid mildly elevated 2.0 white count of 14.  Urinalysis positive for UTI and positive nitrates positive leukocytes numerous WBCs.  Blood cultures are sent, patient started on IV Rocephin.  EKG noted to show prolonged QT, 1 g of magnesium given empirically, however magnesium levels returned within normal range.  Potassium repleted.  CT  abdomen pelvis shows kidney stone with mild left-sided hydronephrosis.  Case discussed with urology who will see the patient for likely stenting.  Hospitalist consulted for admission.  Final Clinical Impression(s) / ED Diagnoses Final diagnoses:  Kidney stone  Acute cystitis with hematuria    Rx / DC Orders ED Discharge Orders     None        Luna Fuse, MD 05/22/21 [phone]

## 2021-05-22 NOTE — ED Notes (Signed)
This RN tried to obtain second set cultures x 2, unsuccessful. IV team consult placed.

## 2021-05-22 NOTE — ED Notes (Signed)
Spoke with IV team who states they cannot come obtain IV access just for labs and stated to call phlebotomy.   Spoke with phlebotomy, phlebotomy stated they would not be able to come to obtain labs at this time.

## 2021-05-23 ENCOUNTER — Inpatient Hospital Stay (HOSPITAL_COMMUNITY): Payer: Self-pay

## 2021-05-23 ENCOUNTER — Encounter (HOSPITAL_COMMUNITY)
Admission: EM | Disposition: A | Discharge status: Home / Self Care | Payer: Self-pay | Source: Home / Self Care | Attending: Family Medicine

## 2021-05-23 ENCOUNTER — Emergency Department (HOSPITAL_COMMUNITY): Payer: Self-pay | Admitting: Certified Registered"

## 2021-05-23 ENCOUNTER — Encounter (HOSPITAL_COMMUNITY): Payer: Self-pay | Admitting: Anesthesiology

## 2021-05-23 ENCOUNTER — Encounter (HOSPITAL_COMMUNITY): Payer: Self-pay | Admitting: Family Medicine

## 2021-05-23 DIAGNOSIS — N131 Hydronephrosis with ureteral stricture, not elsewhere classified: Secondary | ICD-10-CM

## 2021-05-23 DIAGNOSIS — N390 Urinary tract infection, site not specified: Secondary | ICD-10-CM

## 2021-05-23 DIAGNOSIS — R9431 Abnormal electrocardiogram [ECG] [EKG]: Secondary | ICD-10-CM

## 2021-05-23 DIAGNOSIS — N200 Calculus of kidney: Secondary | ICD-10-CM

## 2021-05-23 HISTORY — PX: CYSTOSCOPY WITH RETROGRADE PYELOGRAM, URETEROSCOPY AND STENT PLACEMENT: SHX5789

## 2021-05-23 LAB — BASIC METABOLIC PANEL
Anion gap: 6 (ref 5–15)
BUN: 16 mg/dL (ref 6–20)
CO2: 26 mmol/L (ref 22–32)
Calcium: 8.8 mg/dL — ABNORMAL LOW (ref 8.9–10.3)
Chloride: 110 mmol/L (ref 98–111)
Creatinine, Ser: 1.07 mg/dL — ABNORMAL HIGH (ref 0.44–1.00)
GFR, Estimated: >60 mL/min (ref >60)
Glucose, Bld: 88 mg/dL (ref 70–99)
Potassium: 3.9 mmol/L (ref 3.5–5.1)
Sodium: 142 mmol/L (ref 135–145)

## 2021-05-23 LAB — CBC
HCT: 42 % (ref 36.0–46.0)
Hemoglobin: 14.2 g/dL (ref 12.0–15.0)
MCH: 31.7 pg (ref 26.0–34.0)
MCHC: 33.8 g/dL (ref 30.0–36.0)
MCV: 93.8 fL (ref 80.0–100.0)
Platelets: 155 10*3/uL (ref 150–400)
RBC: 4.48 MIL/uL (ref 3.87–5.11)
RDW: 12.3 % (ref 11.5–15.5)
WBC: 10.4 10*3/uL (ref 4.0–10.5)
nRBC: 0 % (ref 0.0–0.2)

## 2021-05-23 LAB — HEMOGLOBIN A1C
Hgb A1c MFr Bld: 5.7 % — ABNORMAL HIGH (ref 4.8–5.6)
Mean Plasma Glucose: 116.89 mg/dL

## 2021-05-23 LAB — GLUCOSE, CAPILLARY
Glucose-Capillary: 86 mg/dL (ref 70–99)
Glucose-Capillary: 76 mg/dL (ref 70–99)
Glucose-Capillary: 102 mg/dL — ABNORMAL HIGH (ref 70–99)
Glucose-Capillary: 119 mg/dL — ABNORMAL HIGH (ref 70–99)
Glucose-Capillary: 76 mg/dL (ref 70–99)
Glucose-Capillary: 102 mg/dL — ABNORMAL HIGH (ref 70–99)

## 2021-05-23 LAB — HIV ANTIBODY (ROUTINE TESTING W REFLEX): HIV Screen 4th Generation wRfx: NONREACTIVE

## 2021-05-23 SURGERY — CYSTOURETEROSCOPY, WITH RETROGRADE PYELOGRAM AND STENT INSERTION
Anesthesia: General | Laterality: Left

## 2021-05-23 SURGERY — CYSTOURETEROSCOPY, WITH RETROGRADE PYELOGRAM AND STENT INSERTION
Anesthesia: General | Site: Ureter | Laterality: Left

## 2021-05-23 MED ORDER — LIDOCAINE HCL (PF) 2 % IJ SOLN
INTRAMUSCULAR | Status: AC
  Filled 2021-05-23: qty 5

## 2021-05-23 MED ORDER — SODIUM CHLORIDE 0.9 % IR SOLN
Status: DC | PRN
  Administered 2021-05-23: 1000 mL

## 2021-05-23 MED ORDER — INSULIN ASPART 100 UNIT/ML IJ SOLN: 0.0000 [IU] | Freq: Every day | INTRAMUSCULAR | Status: DC

## 2021-05-23 MED ORDER — LIDOCAINE 2% (20 MG/ML) 5 ML SYRINGE
INTRAMUSCULAR | Status: DC | PRN
  Administered 2021-05-23: 60 mg via INTRAVENOUS

## 2021-05-23 MED ORDER — LACTATED RINGERS IV SOLN: INTRAVENOUS | Status: DC | PRN

## 2021-05-23 MED ORDER — SENNOSIDES-DOCUSATE SODIUM 8.6-50 MG PO TABS
1.0000 | ORAL_TABLET | Freq: Every evening | ORAL | Status: DC | PRN
  Filled 2021-05-23: qty 1

## 2021-05-23 MED ORDER — IOHEXOL 300 MG/ML  SOLN
INTRAMUSCULAR | Status: DC | PRN
  Administered 2021-05-23: 5 mL via URETHRAL

## 2021-05-23 MED ORDER — LACTATED RINGERS IV SOLN: INTRAVENOUS | Status: DC

## 2021-05-23 MED ORDER — METOPROLOL TARTRATE 5 MG/5ML IV SOLN: 5.0000 mg | INTRAVENOUS | Status: DC | PRN

## 2021-05-23 MED ORDER — INSULIN GLARGINE 100 UNIT/ML SOLOSTAR PEN: 25.0000 [IU] | PEN_INJECTOR | SUBCUTANEOUS | Status: DC

## 2021-05-23 MED ORDER — OXYCODONE HCL 5 MG PO TABS: 5.0000 mg | ORAL_TABLET | ORAL | Status: DC | PRN

## 2021-05-23 MED ORDER — FENTANYL CITRATE (PF) 100 MCG/2ML IJ SOLN
INTRAMUSCULAR | Status: DC | PRN
  Administered 2021-05-23: 50 ug via INTRAVENOUS
  Administered 2021-05-23 (×2): 25 ug via INTRAVENOUS

## 2021-05-23 MED ORDER — ASPIRIN EC 81 MG PO TBEC
81.0000 mg | DELAYED_RELEASE_TABLET | Freq: Every morning | ORAL | Status: DC
  Administered 2021-05-23 – 2021-05-24 (×2): 81 mg via ORAL
  Filled 2021-05-23 (×2): qty 1

## 2021-05-23 MED ORDER — ACETAMINOPHEN 650 MG RE SUPP
650.0000 mg | Freq: Four times a day (QID) | RECTAL | Status: DC | PRN
  Filled 2021-05-23: qty 1

## 2021-05-23 MED ORDER — INSULIN GLARGINE-YFGN 100 UNIT/ML ~~LOC~~ SOLN
25.0000 [IU] | Freq: Every day | SUBCUTANEOUS | Status: DC
  Administered 2021-05-23 – 2021-05-24 (×2): 25 [IU] via SUBCUTANEOUS
  Filled 2021-05-23 (×3): qty 0.25

## 2021-05-23 MED ORDER — ONDANSETRON HCL 4 MG/2ML IJ SOLN
INTRAMUSCULAR | Status: DC | PRN
  Administered 2021-05-23: 4 mg via INTRAVENOUS

## 2021-05-23 MED ORDER — SODIUM CHLORIDE 0.9 % IV SOLN
1.0000 g | INTRAVENOUS | Status: DC
  Administered 2021-05-23: 1 g via INTRAVENOUS
  Filled 2021-05-23 (×2): qty 10

## 2021-05-23 MED ORDER — SODIUM CHLORIDE 0.9 % IR SOLN
Status: DC | PRN
  Administered 2021-05-23: 3000 mL

## 2021-05-23 MED ORDER — HYDROMORPHONE HCL 1 MG/ML IJ SOLN: 1.0000 mg | INTRAMUSCULAR | Status: DC | PRN

## 2021-05-23 MED ORDER — LOSARTAN POTASSIUM 50 MG PO TABS
50.0000 mg | ORAL_TABLET | Freq: Every day | ORAL | Status: DC
  Administered 2021-05-23 – 2021-05-24 (×2): 50 mg via ORAL
  Filled 2021-05-23 (×2): qty 1

## 2021-05-23 MED ORDER — MIDAZOLAM HCL 2 MG/2ML IJ SOLN
INTRAMUSCULAR | Status: AC
  Filled 2021-05-23: qty 2

## 2021-05-23 MED ORDER — SUCCINYLCHOLINE CHLORIDE 200 MG/10ML IV SOSY
PREFILLED_SYRINGE | INTRAVENOUS | Status: DC | PRN
  Administered 2021-05-23: 160 mg via INTRAVENOUS

## 2021-05-23 MED ORDER — ONDANSETRON HCL 4 MG/2ML IJ SOLN
INTRAMUSCULAR | Status: AC
  Filled 2021-05-23: qty 2

## 2021-05-23 MED ORDER — PROPOFOL 10 MG/ML IV BOLUS
INTRAVENOUS | Status: DC | PRN
  Administered 2021-05-23: 110 mg via INTRAVENOUS

## 2021-05-23 MED ORDER — SUCCINYLCHOLINE CHLORIDE 200 MG/10ML IV SOSY
PREFILLED_SYRINGE | INTRAVENOUS | Status: AC
  Filled 2021-05-23: qty 10

## 2021-05-23 MED ORDER — ACETAMINOPHEN 325 MG PO TABS: 650.0000 mg | ORAL_TABLET | Freq: Four times a day (QID) | ORAL | Status: DC | PRN

## 2021-05-23 MED ORDER — TRAZODONE HCL 50 MG PO TABS
50.0000 mg | ORAL_TABLET | Freq: Every evening | ORAL | Status: DC | PRN
  Filled 2021-05-23: qty 1

## 2021-05-23 MED ORDER — IPRATROPIUM-ALBUTEROL 0.5-2.5 (3) MG/3ML IN SOLN
3.0000 mL | RESPIRATORY_TRACT | Status: DC | PRN
  Filled 2021-05-23: qty 3

## 2021-05-23 MED ORDER — AMISULPRIDE (ANTIEMETIC) 5 MG/2ML IV SOLN: 10.0000 mg | Freq: Once | INTRAVENOUS | Status: DC | PRN

## 2021-05-23 MED ORDER — HYDRALAZINE HCL 20 MG/ML IJ SOLN: 10.0000 mg | INTRAMUSCULAR | Status: DC | PRN

## 2021-05-23 MED ORDER — ROSUVASTATIN CALCIUM 10 MG PO TABS
10.0000 mg | ORAL_TABLET | Freq: Every day | ORAL | Status: DC
  Administered 2021-05-23 – 2021-05-24 (×2): 10 mg via ORAL
  Filled 2021-05-23 (×2): qty 1

## 2021-05-23 MED ORDER — INSULIN ASPART 100 UNIT/ML IJ SOLN: 0.0000 [IU] | Freq: Three times a day (TID) | INTRAMUSCULAR | Status: DC

## 2021-05-23 MED ORDER — ALBUTEROL SULFATE (2.5 MG/3ML) 0.083% IN NEBU: 2.5000 mg | INHALATION_SOLUTION | Freq: Four times a day (QID) | RESPIRATORY_TRACT | Status: DC | PRN

## 2021-05-23 MED ORDER — ALBUTEROL SULFATE (2.5 MG/3ML) 0.083% IN NEBU
INHALATION_SOLUTION | RESPIRATORY_TRACT | Status: AC
  Administered 2021-05-23: 2.5 mg via RESPIRATORY_TRACT
  Filled 2021-05-23: qty 3

## 2021-05-23 MED ORDER — FENTANYL CITRATE PF 50 MCG/ML IJ SOSY: 25.0000 ug | PREFILLED_SYRINGE | INTRAMUSCULAR | Status: DC | PRN

## 2021-05-23 MED ORDER — CHLORHEXIDINE GLUCONATE CLOTH 2 % EX PADS
6.0000 | MEDICATED_PAD | Freq: Every day | CUTANEOUS | Status: DC
  Administered 2021-05-23 – 2021-05-24 (×2): 6 via TOPICAL

## 2021-05-23 SURGICAL SUPPLY — 17 items
BAG URO CATCHER STRL LF (MISCELLANEOUS) ×2 IMPLANT
BASKET ZERO TIP NITINOL 2.4FR (BASKET) IMPLANT
CATH URETL OPEN 5X70 (CATHETERS) ×2 IMPLANT
CLOTH BEACON ORANGE TIMEOUT ST (SAFETY) ×2 IMPLANT
DRSG TEGADERM 2-3/8X2-3/4 SM (GAUZE/BANDAGES/DRESSINGS) ×2 IMPLANT
EXTRACTOR STONE 1.7FRX115CM (UROLOGICAL SUPPLIES) ×2 IMPLANT
GLOVE SURG ENC MOIS LTX SZ6.5 (GLOVE) ×2 IMPLANT
GOWN STRL REUS W/TWL LRG LVL3 (GOWN DISPOSABLE) ×2 IMPLANT
GUIDEWIRE STR DUAL SENSOR (WIRE) ×2 IMPLANT
KIT TURNOVER KIT A (KITS) ×2 IMPLANT
MANIFOLD NEPTUNE II (INSTRUMENTS) ×2 IMPLANT
PACK CYSTO (CUSTOM PROCEDURE TRAY) ×2 IMPLANT
SHEATH NAVIGATOR HD 11/13X28 (SHEATH) IMPLANT
SHEATH NAVIGATOR HD 11/13X36 (SHEATH) IMPLANT
STENT URET 6FRX24 CONTOUR (STENTS) ×2 IMPLANT
TUBING CONNECTING 10 (TUBING) ×2 IMPLANT
TUBING UROLOGY SET (TUBING) ×2 IMPLANT

## 2021-05-23 SURGICAL SUPPLY — 22 items
BAG URO CATCHER STRL LF (MISCELLANEOUS) ×2 IMPLANT
BASKET ZERO TIP NITINOL 2.4FR (BASKET) IMPLANT
CATH URETL OPEN 5X70 (CATHETERS) ×2 IMPLANT
CLOTH BEACON ORANGE TIMEOUT ST (SAFETY) ×2 IMPLANT
GLOVE SURG ENC MOIS LTX SZ6.5 (GLOVE) ×2 IMPLANT
GLOVE SURG ENC MOIS LTX SZ7.5 (GLOVE) ×2 IMPLANT
GLOVE SURG ENC MOIS LTX SZ8 (GLOVE) ×2 IMPLANT
GLOVE SURG LTX SZ8 (GLOVE) ×2 IMPLANT
GOWN STRL REUS W/ TWL XL LVL3 (GOWN DISPOSABLE) ×1 IMPLANT
GOWN STRL REUS W/TWL LRG LVL3 (GOWN DISPOSABLE) ×2 IMPLANT
GOWN STRL REUS W/TWL XL LVL3 (GOWN DISPOSABLE) ×2
GUIDEWIRE STR DUAL SENSOR (WIRE) ×2 IMPLANT
KIT TURNOVER KIT A (KITS) ×2 IMPLANT
MANIFOLD NEPTUNE II (INSTRUMENTS) ×2 IMPLANT
PACK CYSTO (CUSTOM PROCEDURE TRAY) ×2 IMPLANT
SHEATH NAVIGATOR HD 11/13X28 (SHEATH) IMPLANT
SHEATH NAVIGATOR HD 11/13X36 (SHEATH) IMPLANT
STENT URET 6FRX24 CONTOUR (STENTS) ×2 IMPLANT
TRAY FOL W/BAG SLVR 16FR STRL (SET/KITS/TRAYS/PACK) ×1 IMPLANT
TRAY FOLEY W/BAG SLVR 16FR LF (SET/KITS/TRAYS/PACK) ×2
TUBING CONNECTING 10 (TUBING) ×2 IMPLANT
TUBING UROLOGY SET (TUBING) ×2 IMPLANT

## 2021-05-23 NOTE — Discharge Instructions (Addendum)
Post stent placement instructions  It is imperative that you follow-up with urology to address the stones and indwelling ureteral stent.  The stent can stay in for approximately 4-6 months but if left in longer can become encrusted with stone and require multiple surgeries to remove.  It can also be a source of infection.  Please follow-up with your urologist or [facility].    Definitions:  Ureter: The duct that transports urine from the kidney to the bladder. Stent: A plastic hollow tube that is placed into the ureter, from the kidney to the bladder to prevent the ureter from swelling shut.  General instructions:  Despite the fact that no skin incisions were used, the area around the ureter and bladder is raw and irritated. The stent is a foreign body which can further irritate the bladder wall. This irritation is manifested by increased frequency of urination, both day and night, and by an increase in the urge to urinate. In some, the urge to urinate is present almost always. Sometimes the urge is strong enough that you may not be able to stop your self from urinating. This can often be controlled with medication but does not occur in everyone. A stent can safely be left in place for 3 months or greater.  You may see some blood in your urine while the stent is in place and a few days afterward. Do not be alarmed, even if the urine is clear for a while. Get off your feet and drink lots of fluids until clearing occurs. If you start to pass clots or don't improve, call us.  Diet:  You may return to your normal diet immediately. Because of the raw surface of your bladder, alcohol, spicy foods, foods high in acid and drinks with caffeine may cause irritation or frequency and should be used in moderation. To keep your urine flowing freely and avoid constipation, drink plenty of fluids during the day (8-10 glasses). Tip: Avoid cranberry juice because it is very acidic.  Activity:  Your physical  activity doesn't need to be restricted. However, if you are very active, you may see some blood in the urine. We suggest that you reduce your activity under the circumstances until the bleeding has stopped.  Bowels:  It is important to keep your bowels regular during the postoperative period. Straining with bowel movements can cause bleeding. A bowel movement every other day is reasonable. Use a mild laxative if needed, such as milk of magnesia 2-3 tablespoons, or 2 Dulcolax tablets. Call if you continue to have problems. If you had been taking narcotics for pain, before, during or after your surgery, you may be constipated. Take a laxative if necessary.  Medication:  You should resume your pre-surgery medications unless told not to. In addition you may be given an antibiotic to prevent or treat infection. Antibiotics are not always necessary. All medication should be taken as prescribed until the bottles are finished unless you are having an unusual reaction to one of the drugs.  Problems you should report to [place]:  a. Fever greater than 101F. b. Heavy bleeding, or clots (see notes above about blood in urine). c. Inability to urinate. d. Drug reactions (hives, rash, nausea, vomiting, diarrhea). e. Severe burning or pain with urination that is not improving.

## 2021-05-23 NOTE — Transfer of Care (Signed)
Immediate Anesthesia Transfer of Care Note  Patient: [name]  Procedure(s) Performed: CYSTOSCOPY WITH RETROGRADE PYELOGRAM, URETEROSCOPY AND STENT PLACEMENT (Left: Ureter)  Patient Location: PACU  Anesthesia Type:General  Level of Consciousness: awake, alert , oriented and patient cooperative  Airway & Oxygen Therapy: Patient Spontanous Breathing and Patient connected to face mask oxygen  Post-op Assessment: Report given to RN and Post -op Vital signs reviewed and stable  Post vital signs: Reviewed and stable  Last Vitals:  Vitals Value Taken Time  BP 155/83 05/23/21 0117  Temp    Pulse 107 05/23/21 0120  Resp 21 05/23/21 0120  SpO2 98 % 05/23/21 0120  Vitals shown include unvalidated device data.  Last Pain:  Vitals:   05/22/21 1856  TempSrc:   PainSc: 9          Complications: No notable events documented.

## 2021-05-23 NOTE — Plan of Care (Signed)
  Problem: Education: Goal: Knowledge of General Education information will improve Description: Including pain rating scale, medication(s)/side effects and non-pharmacologic comfort measures Outcome: Progressing   Problem: Clinical Measurements: Goal: Will remain free from infection Outcome: Progressing Goal: Diagnostic test results will improve Outcome: Progressing   Problem: Activity: Goal: Risk for activity intolerance will decrease Outcome: Progressing   Problem: Nutrition: Goal: Adequate nutrition will be maintained Outcome: Progressing   

## 2021-05-23 NOTE — Progress Notes (Signed)
PROGRESS NOTE    JODELL WEITMAN  OMV:[id number] DOB: 04-Feb-1961 DOA: 05/22/2021 PCP: Shirlean Mylar, MD   Brief Narrative:  [age]F with history of DM2, HTN, HLD admitted for generalized weakness and malaise.  Upon admission she was found to have a 3 mm left-sided obstructive stone with hydronephrosis.  She was started on IV antibiotics, urology was consulted.  Patient underwent cystoscopy and stent placement on 11/22 early in the morning.  Foley catheter was placed for maximal drainage.   Assessment & Plan:   Principal Problem:   Complicated UTI (urinary tract infection) Active Problems:   HTN (hypertension)   Diabetes mellitus type 2 in obese (HCC)   COPD (chronic obstructive pulmonary disease) (HCC)   Kidney stone   Hydronephrosis due to obstruction of ureter   Prolonged QT interval  Complicated urinary tract infection Hydronephrosis with obstruction Renal stones. - Currently on empiric IV Rocephin.  Underwent cystoscopy with stent placement on 11/22.  Follow-up culture data, pain control.  Foley catheter remains in place.  Urology team is following.  Essential hypertension - Losartan 50 mg daily  Hyperlipidemia - Crestor  Diabetes mellitus type 2 -Currently on Semglee 25 units daily.  Sliding scale and Accu-Cheks.    DVT prophylaxis: SCDs  Code Status: Full code Family Communication:    Status is: Inpatient  Maintain hospital stay for biotics, IV fluids.  Foley catheter in place.  Will discharge once cleared by urology.       Subjective: Tolerated her procedure overnight, resting.  Review of Systems Otherwise negative except as per HPI, including: General: Denies fever, chills, night sweats or unintended weight loss. Resp: Denies cough, wheezing, shortness of breath. Cardiac: Denies chest pain, palpitations, orthopnea, paroxysmal nocturnal dyspnea. GI: Denies abdominal pain, nausea, vomiting, diarrhea or constipation GU: Denies dysuria,  frequency, hesitancy or incontinence MS: Denies muscle aches, joint pain or swelling Neuro: Denies headache, neurologic deficits (focal weakness, numbness, tingling), abnormal gait Psych: Denies anxiety, depression, SI/HI/AVH Skin: Denies new rashes or lesions ID: Denies sick contacts, exotic exposures, travel  Examination:  General exam: Appears calm and comfortable  Respiratory system: Clear to auscultation. Respiratory effort normal. Cardiovascular system: S1 & S2 heard, RRR. No JVD, murmurs, rubs, gallops or clicks. No pedal edema. Gastrointestinal system: Abdomen is nondistended, soft and nontender. No organomegaly or masses felt. Normal bowel sounds heard. Central nervous system: Alert and oriented. No focal neurological deficits. Extremities: Symmetric 5 x 5 power. Skin: No rashes, lesions or ulcers Psychiatry: Judgement and insight appear normal. Mood & affect appropriate.   Foley catheter in place  Objective: Vitals:   05/23/21 0735 05/23/21 0739 05/23/21 0800 05/23/21 0803  BP:   131/65   Pulse: 89 89 91 92  Resp: 18 17 16 15   Temp:      TempSrc:      SpO2: 98% 94% 98% 98%  Weight:      Height:        Intake/Output Summary (Last 24 hours) at 05/23/2021 0848 Last data filed at 05/23/2021 0800 Gross per 24 hour  Intake 1450 ml  Output 200 ml  Net 1250 ml   Filed Weights   05/22/21 1856  Weight: 117.9 kg     Data Reviewed:   CBC: Recent Labs  Lab 05/22/21 1857 05/23/21 0525  WBC 14.9* 10.4  NEUTROABS 10.8*  --   HGB 16.7* 14.2  HCT 49.6* 42.0  MCV 92.0 93.8  PLT 321 155   Basic Metabolic Panel: Recent Labs  Lab 05/22/21  1857 05/22/21 2259 05/23/21 0525  NA 141  --  142  K 3.6  --  3.9  CL 107  --  110  CO2 24  --  26  GLUCOSE 85  --  88  BUN 17  --  16  CREATININE 0.96  --  1.07*  CALCIUM 9.6  --  8.8*  MG  --  1.8  --    GFR: Estimated Creatinine Clearance: 72.4 mL/min (A) (by C-G formula based on SCr of 1.07 mg/dL (H)). Liver  Function Tests: Recent Labs  Lab 05/22/21 1857  AST 25  ALT 20  ALKPHOS 102  BILITOT 0.8  PROT 8.1  ALBUMIN 4.3   No results for input(s): LIPASE, AMYLASE in the last 168 hours. No results for input(s): AMMONIA in the last 168 hours. Coagulation Profile: No results for input(s): INR, PROTIME in the last 168 hours. Cardiac Enzymes: No results for input(s): CKTOTAL, CKMB, CKMBINDEX, TROPONINI in the last 168 hours. BNP (last 3 results) No results for input(s): PROBNP in the last 8760 hours. HbA1C: Recent Labs    05/23/21 0525  HGBA1C 5.7*   CBG: Recent Labs  Lab 05/23/21 0118 05/23/21 0717  GLUCAP 86 76   Lipid Profile: No results for input(s): CHOL, HDL, LDLCALC, TRIG, CHOLHDL, LDLDIRECT in the last 72 hours. Thyroid Function Tests: No results for input(s): TSH, T4TOTAL, FREET4, T3FREE, THYROIDAB in the last 72 hours. Anemia Panel: No results for input(s): VITAMINB12, FOLATE, FERRITIN, TIBC, IRON, RETICCTPCT in the last 72 hours. Sepsis Labs: Recent Labs  Lab 05/22/21 1857 05/22/21 2110  LATICACIDVEN 2.0* 1.4    Recent Results (from the past 240 hour(s))  Culture, blood (routine x 2)     Status: None (Preliminary result)   Collection Time: 05/22/21  6:57 PM   Specimen: Left Antecubital; Blood  Result Value Ref Range Status   Specimen Description   Final    LEFT ANTECUBITAL Performed at [facility], [address], [place], [place] [address]    Special Requests   Final    BOTTLES DRAWN AEROBIC AND ANAEROBIC Blood Culture adequate volume Performed at [facility], [address], [place], [place] [address]    Culture   Final    NO GROWTH < 12 HOURS Performed at [facility], [address], [place], [place] [address]    Report Status PENDING  Incomplete  Resp Panel by RT-PCR (Flu A&B, Covid) Nasopharyngeal Swab     Status: None   Collection Time: 05/22/21  6:57 PM   Specimen: Nasopharyngeal Swab;  Nasopharyngeal(NP) swabs in vial transport medium  Result Value Ref Range Status   SARS Coronavirus 2 by RT PCR NEGATIVE NEGATIVE Final    Comment: (NOTE) SARS-CoV-2 target nucleic acids are NOT DETECTED.  The SARS-CoV-2 RNA is generally detectable in upper respiratory specimens during the acute phase of infection. The lowest concentration of SARS-CoV-2 viral copies this assay can detect is 138 copies/mL. A negative result does not preclude SARS-Cov-2 infection and should not be used as the sole basis for treatment or other patient management decisions. A negative result may occur with  improper specimen collection/handling, submission of specimen other than nasopharyngeal swab, presence of viral mutation(s) within the areas targeted by this assay, and inadequate number of viral copies(<138 copies/mL). A negative result must be combined with clinical observations, patient history, and epidemiological information. The expected result is Negative.  Fact Sheet for Patients:  BloggerCourse.com  Fact Sheet for Healthcare Providers:  SeriousBroker.it  This test is no t yet approved or cleared by the [place] and  has been authorized for detection and/or diagnosis of SARS-CoV-2 by FDA under an Emergency Use Authorization (EUA). This EUA will remain  in effect (meaning this test can be used) for the duration of the COVID-19 declaration under Section 564(b)(1) of the Act, 21 U.S.C.section 360bbb-3(b)(1), unless the authorization is terminated  or revoked sooner.       Influenza A by PCR NEGATIVE NEGATIVE Final   Influenza B by PCR NEGATIVE NEGATIVE Final    Comment: (NOTE) The Xpert Xpress SARS-CoV-2/FLU/RSV plus assay is intended as an aid in the diagnosis of influenza from Nasopharyngeal swab specimens and should not be used as a sole basis for treatment. Nasal washings and aspirates are unacceptable for Xpert Xpress  SARS-CoV-2/FLU/RSV testing.  Fact Sheet for Patients: BloggerCourse.com  Fact Sheet for Healthcare Providers: SeriousBroker.it  This test is not yet approved or cleared by the [place] FDA and has been authorized for detection and/or diagnosis of SARS-CoV-2 by FDA under an Emergency Use Authorization (EUA). This EUA will remain in effect (meaning this test can be used) for the duration of the COVID-19 declaration under Section 564(b)(1) of the Act, 21 U.S.C. section 360bbb-3(b)(1), unless the authorization is terminated or revoked.  Performed at [facility], [address] [place], [place] [address]          Radiology Studies: CT Head Wo Contrast  Result Date: 05/22/2021 CLINICAL DATA:  Dizziness EXAM: CT HEAD WITHOUT CONTRAST TECHNIQUE: Contiguous axial images were obtained from the base of the skull through the vertex without intravenous contrast. COMPARISON:  MRI report 09/30/2003 FINDINGS: Brain: No evidence of acute infarction, hemorrhage, hydrocephalus, extra-axial collection or mass lesion/mass effect. Vascular: No hyperdense vessel or unexpected calcification. Mild carotid vascular calcification Skull: Normal. Negative for fracture or focal lesion. Sinuses/Orbits: No acute finding. Other: None IMPRESSION: Negative non contrasted CT appearance of the brain Electronically Signed   By: [name] Pang M.D.   On: 05/22/2021 21:08   MR BRAIN WO CONTRAST  Result Date: 05/22/2021 CLINICAL DATA:  Neuro deficit, stroke suspected EXAM: MRI HEAD WITHOUT CONTRAST TECHNIQUE: Multiplanar, multiecho pulse sequences of the brain and surrounding structures were obtained without intravenous contrast. COMPARISON:  09/30/2003 MRI brain. Correlation is also made with CT head 05/22/2021. FINDINGS: Evaluation is somewhat limited by motion artifact. Brain: No restricted diffusion to suggest acute or subacute infarct. No acute  hemorrhage, mass, mass effect, or midline shift. No hydrocephalus or extra-axial collection. T2 hyperintense signal in the periventricular white matter, likely the sequela of chronic small vessel ischemic disease. No foci of hemosiderin deposition to suggest remote hemorrhage. Vascular: Normal flow voids. Skull and upper cervical spine: Normal marrow signal. Evaluation of the cervical spine is limited by motion artifact. Sinuses/Orbits: Negative. Other: Fluid in the bilateral mastoid air cells. IMPRESSION: No acute intracranial process. No evidence of acute or subacute infarct. Electronically Signed   By: Wiliam Ke M.D.   On: 05/22/2021 22:25   CT ABDOMEN PELVIS W CONTRAST  Result Date: 05/22/2021 CLINICAL DATA:  Trauma fall with flank pain EXAM: CT ABDOMEN AND PELVIS WITH CONTRAST TECHNIQUE: Multidetector CT imaging of the abdomen and pelvis was performed using the standard protocol following bolus administration of intravenous contrast. CONTRAST:  71mL OMNIPAQUE IOHEXOL 350 MG/ML SOLN COMPARISON:  CT 12/12/2019 FINDINGS: Lower chest: Lung bases demonstrate patchy ground-glass opacity and mosaicism. This may be due to small airways disease. Atelectasis or  scarring at the right middle lobe. No pleural effusion. Hepatobiliary: Status post cholecystectomy. No focal hepatic abnormality. Stable mild enlargement of extrahepatic common bile duct. Hepatic steatosis Pancreas: Unremarkable. No pancreatic ductal dilatation or surrounding inflammatory changes. Spleen: Normal in size without focal abnormality. Adrenals/Urinary Tract: Adrenal glands are normal. Multifocal cortical scarring within the bilateral kidneys. There are multiple intrarenal stones bilaterally. There is mild hydronephrosis of the left kidney, secondary to a 3 mm stone at the left UPJ. There is an additional 6 mm nonobstructing stone in the left renal pelvis. Urinary bladder unremarkable Stomach/Bowel: Stomach nonenlarged. No dilated small bowel.  Negative appendix. No acute bowel wall thickening. Vascular/Lymphatic: Moderate aortic atherosclerosis. No aneurysm. No suspicious nodes Reproductive: Uterus and bilateral adnexa are unremarkable. Other: Negative for pelvic effusion or free air. Musculoskeletal: No acute osseous abnormality. IMPRESSION: 1. Mild left hydronephrosis, secondary to a 3 mm stone at the left UPJ. Multiple bilateral intrarenal stones including nonobstructing 6 mm stone in left renal pelvis. There is multifocal cortical scarring within the bilateral kidneys. 2. Hepatic steatosis Electronically Signed   By: [name] Pang M.D.   On: 05/22/2021 21:18   DG Chest Portable 1 View  Result Date: 05/22/2021 CLINICAL DATA:  Fall, altered mental status. Fall at home unable to get up. EXAM: PORTABLE CHEST 1 VIEW COMPARISON:  08/26/2020 FINDINGS: The cardiomediastinal contours are unchanged, upper normal heart size likely accentuated by technique. Chronic bronchitic change. Pulmonary vasculature is normal. No consolidation, pleural effusion, or pneumothorax. No acute osseous abnormalities are seen. IMPRESSION: No acute process.  Chronic bronchial thickening. Electronically Signed   By: Narda Rutherford M.D.   On: 05/22/2021 19:31   DG C-Arm 1-60 Min-No Report  Result Date: 05/23/2021 Fluoroscopy was utilized by the requesting physician.  No radiographic interpretation.        Scheduled Meds:  aspirin EC  81 mg Oral q morning   insulin aspart  0-15 Units Subcutaneous TID WC   insulin aspart  0-5 Units Subcutaneous QHS   insulin glargine-yfgn  25 Units Subcutaneous Daily   losartan  50 mg Oral Daily   rosuvastatin  10 mg Oral Daily   Continuous Infusions:  cefTRIAXone (ROCEPHIN)  IV     lactated ringers 100 mL/hr at 05/23/21 0612     LOS: 0 days   Time spent= 35 mins    Moustafa Mossa Joline Maxcy, MD [facility]  If 7PM-7AM, please contact night-coverage  05/23/2021, 8:48 AM

## 2021-05-23 NOTE — Progress Notes (Signed)
Pt from ER, slurred speeches, alert, ox3. To OR now.

## 2021-05-23 NOTE — Anesthesia Procedure Notes (Signed)
Procedure Name: Intubation Date/Time: 05/23/2021 12:45 AM Performed by: Ponciano Ort, CRNA Pre-anesthesia Checklist: Patient identified, Emergency Drugs available, Suction available and Patient being monitored Patient Re-evaluated:Patient Re-evaluated prior to induction Oxygen Delivery Method: Circle system utilized Preoxygenation: Pre-oxygenation with 100% oxygen Induction Type: IV induction Ventilation: Mask ventilation without difficulty Laryngoscope Size: Glidescope and 4 Grade View: Grade I Tube type: Oral Tube size: 7.0 mm Number of attempts: 1 Airway Equipment and Method: Stylet and Oral airway Placement Confirmation: ETT inserted through vocal cords under direct vision, positive ETCO2 and breath sounds checked- equal and bilateral Secured at: 21 cm Tube secured with: Tape Dental Injury: Teeth and Oropharynx as per pre-operative assessment

## 2021-05-23 NOTE — H&P (Signed)
History and Physical    [name] [id number] DOB: 15-Sep-1960 DOA: 05/22/2021  PCP: Holly Damme, MD   Patient coming from: Home  Chief Complaint: "I don't feel well"  HPI: [name] is a 60 y.o. female with medical history significant for DMT2, HTN, HLD who presents for evaluation of generalized weakness and malaise.  She reports for the last 2 to 3 days she has not been feeling well and has generally weaker.  She reports that on the morning of May 22, 2021 she fell out of bed and was unable to get up secondary to weakness.  She stayed in the ground until her family came by and checked on her later in the day and she was brought to the emergency room.  She reports she has been having urinary hesitancy/frequency with decreased amount of urine volume for her.  She also reports having dysuria over the past day or 2.  States she has not had any fever or chills.  She has not had any cough, shortness of breath, chest pain, headache, nausea vomiting or diarrhea.  She has had low back pain in the left side over the last few days.  Reportedly her family told the ER physician that she was seemed more confused over the last few days.  No family is at bedside at this time  ED Course: [name] initial temperature of 95.1 degrees.  She is been hemodynamically stable in the emergency room.  Temperature has improved to 98.5 degrees.  She is found to have a 3 mm left renal stone obstructing her ureter causing mild hydronephrosis.  Lab work shows a WBC of 14,900 hemoglobin 16.7 hematocrit 49.6 platelets 321,000.  Patient lactic acid level 2.0.  Lactic acid improved to 1.4 after IV fluid hydration.  Troponin negative at 10 2 occasions in the emergency room.  CMP is unremarkable.  Has been consulted and Dr. Claudia [name] is taken to the OR tonight for removal of stone and stent placement.  Patient was started on antibiotics in the emergency room.  Hospitalist services been asked to admit for  further management  Review of Systems:  General: Reports fatigue and malaise. Reports generalized weakness. Denies fever, chills, weight loss, night sweats. Denies dizziness.  Denies change in appetite HENT: Denies head trauma, headache, denies change in hearing, tinnitus.  Denies nasal congestion or bleeding.  Denies sore throat, sores in mouth.  Denies difficulty swallowing Eyes: Denies blurry vision, pain in eye, drainage.  Denies discoloration of eyes. Neck: Denies pain.  Denies swelling.  Denies pain with movement. Cardiovascular: Denies chest pain, palpitations.  Denies edema.  Denies orthopnea Respiratory: Denies shortness of breath, cough.  Denies wheezing.  Denies sputum production Gastrointestinal: Denies abdominal pain, swelling.  Denies nausea, vomiting, diarrhea.  Denies melena.  Denies hematemesis. Musculoskeletal: Reports low back pain on left. Denies limitation of movement. Denies deformity or swelling.  Genitourinary: Denies pelvic pain. Reports urinary frequency/hesitancy. Reports dysuria.  Skin: Denies rash.  Denies petechiae, purpura, ecchymosis. Neurological: Denies syncope.  Denies seizure activity. Denies paresthesia. Denies slurred speech, drooping face.  Denies visual change. Psychiatric: Denies depression, anxiety. Denies hallucinations.  Past Medical History:  Diagnosis Date   BACK PAIN, LOW 08/29/2006   CHF (congestive heart failure) ([facility])    DEPRESSION, MAJOR, RECURRENT 08/29/2006   DISORDER, BIPOLAR NOS 09/09/2006   DMII (diabetes mellitus, type 2) ([place]) 09/19/2010   Hyperlipidemia 09/19/2010   LDL 78 on 08/2010. Recheck 08/2011.     HYPERTENSION, BENIGN SYSTEMIC 08/29/2006  Well controlled on Lopressor and lisinopril. Check BMET 12/2011     Kidney infection    OVARIAN MASS 09/18/2007   Benign ovarian cyst s/p laparotomy for removal    TOBACCO DEPENDENCE 08/29/2006   Patient quit in early months of 2012. Continue to follow for possible relapse.     Urosepsis  09/27/2012    Past Surgical History:  Procedure Laterality Date   PARTIAL HYSTERECTOMY     unsure if had cervix removed.    stents Bilateral    Kidneys    Social History  reports that she has been smoking cigarettes. She has been smoking an average of 1 pack per day. She has never used smokeless tobacco. She reports that she does not currently use alcohol. She reports that she does not use drugs.  Allergies  Allergen Reactions   Tamsulosin Shortness Of Breath    History reviewed. No pertinent family history.   Prior to Admission medications   Medication Sig Start Date End Date Taking? Authorizing Provider  acetaminophen (TYLENOL) 500 MG tablet Take 1,000 mg by mouth every 6 (six) hours as needed for mild pain.   Yes [provider]  aspirin 81 MG EC tablet Take 81 mg by mouth every morning.   Yes [provider]  Insulin Degludec (TRESIBA FLEXTOUCH [place]) Inject 3 Units into the skin once a week. Wednesdays   Yes [provider]  insulin glargine (LANTUS) 100 UNIT/ML Solostar Pen Inject 25 Units into the skin every morning. 10/31/20  Yes Holly Damme, MD  losartan (COZAAR) 50 MG tablet TAKE ONE TABLET BY MOUTH DAILY Patient taking differently: Take 50 mg by mouth daily. 03/14/21  Yes Holly Damme, MD  Multiple Vitamin (MULTIVITAMIN WITH MINERALS) TABS Take 1 tablet by mouth every morning.   Yes [provider]  nitroGLYCERIN (NITROSTAT) 0.4 MG SL tablet Place 1 tablet (0.4 mg total) under the tongue every 5 (five) minutes as needed for chest pain. If after 1 tablet and 5 minutes the chest pain is still present, go to emergency room. 08/23/20  Yes Holly Damme, MD  rosuvastatin (CRESTOR) 10 MG tablet TAKE ONE TABLET BY MOUTH DAILY Patient taking differently: Take 10 mg by mouth daily. 03/14/21  Yes Holly Damme, MD  hydrocortisone cream 0.5 % Apply 1 application topically 2 (two) times daily. Patient not taking: Reported on 10/19/2020  10/21/17   Leeanne Rio, MD  Insulin Pen Needle (NOVOFINE PEN NEEDLE) 32G X 6 MM MISC 1 each by Does not apply route daily. 11/08/20   Holly Damme, MD  metFORMIN (GLUCOPHAGE) 500 MG tablet TAKE 2 TABLETS (1,000 MG TOTAL) BY MOUTH 2 (TWO) TIMES DAILY WITH A MEAL. Patient not taking: Reported on 05/22/2021 10/31/20 10/31/21  Holly Damme, MD    Physical Exam: Vitals:   05/23/21 0030 05/23/21 0117 05/23/21 0130 05/23/21 0132  BP: (!) 153/71 (!) 155/83 (!) 104/28 130/78  Pulse: 94 (!) 108 100 (!) 102  Resp: 12 18 19  (!) 22  Temp:  98.5 F (36.9 C)    TempSrc:      SpO2: 98% 99% 96% 94%  Weight:      Height:        Constitutional: NAD, calm, comfortable Vitals:   05/23/21 0030 05/23/21 0117 05/23/21 0130 05/23/21 0132  BP: (!) 153/71 (!) 155/83 (!) 104/28 130/78  Pulse: 94 (!) 108 100 (!) 102  Resp: 12 18 19  (!) 22  Temp:  98.5 F (36.9 C)    TempSrc:  SpO2: 98% 99% 96% 94%  Weight:      Height:       General: WDWN, Alert and oriented x3.  Eyes: EOMI, PERRL, conjunctivae normal.  Sclera nonicteric HENT:  [place]/AT, external ears normal.  Nares patent without epistasis.  Mucous membranes are moist.  Neck: Soft, normal range of motion, supple, no masses, no thyromegaly.  Trachea midline Respiratory: clear to auscultation bilaterally, no wheezing, no crackles. Normal respiratory effort. No accessory muscle use.  Cardiovascular: Regular rate and rhythm, no murmurs / rubs / gallops. No extremity edema. 2+ pedal pulses. Abdomen: Soft, no tenderness, nondistended, no rebound or guarding.  No masses palpated. Obese. Bowel sounds normoactive. Left CVA tenderness to percussion Musculoskeletal: FROM. no cyanosis. Normal muscle tone.  Skin: Warm, dry, intact no rashes, lesions, ulcers. No induration Neurologic: CN 2-12 grossly intact.  Normal speech.  Sensation intact to touch. Strength 5/5 in all extremities.   Psychiatric: Normal judgment and insight. Normal mood.    Labs on  Admission: I have personally reviewed following labs and imaging studies  CBC: Recent Labs  Lab 05/22/21 1857  WBC 14.9*  NEUTROABS 10.8*  HGB 16.7*  HCT 49.6*  MCV 92.0  PLT [id number]    Basic Metabolic Panel: Recent Labs  Lab 05/22/21 1857 05/22/21 2259  NA 141  --   K 3.6  --   CL 107  --   CO2 24  --   GLUCOSE 85  --   BUN 17  --   CREATININE 0.96  --   CALCIUM 9.6  --   MG  --  1.8    GFR: Estimated Creatinine Clearance: 80.7 mL/min (by C-G formula based on SCr of 0.96 mg/dL).  Liver Function Tests: Recent Labs  Lab 05/22/21 1857  AST 25  ALT 20  ALKPHOS 102  BILITOT 0.8  PROT 8.1  ALBUMIN 4.3    Urine analysis:    Component Value Date/Time   COLORURINE YELLOW 05/22/2021 2240   APPEARANCEUR HAZY (A) 05/22/2021 2240   LABSPEC 1.045 (H) 05/22/2021 2240   PHURINE 6.0 05/22/2021 2240   GLUCOSEU NEGATIVE 05/22/2021 2240   HGBUR LARGE (A) 05/22/2021 2240   HGBUR large 07/25/2009 1125   BILIRUBINUR NEGATIVE 05/22/2021 2240   BILIRUBINUR negative 03/02/2019 0905   BILIRUBINUR NEG 11/19/2014 0856   KETONESUR 20 (A) 05/22/2021 2240   PROTEINUR NEGATIVE 05/22/2021 2240   UROBILINOGEN 0.2 03/02/2019 0905   UROBILINOGEN 1.0 05/22/2013 1659   NITRITE POSITIVE (A) 05/22/2021 2240   LEUKOCYTESUR LARGE (A) 05/22/2021 2240    Radiological Exams on Admission: CT Head Wo Contrast  Result Date: 05/22/2021 CLINICAL DATA:  Dizziness EXAM: CT HEAD WITHOUT CONTRAST TECHNIQUE: Contiguous axial images were obtained from the base of the skull through the vertex without intravenous contrast. COMPARISON:  MRI report 09/30/2003 FINDINGS: Brain: No evidence of acute infarction, hemorrhage, hydrocephalus, extra-axial collection or mass lesion/mass effect. Vascular: No hyperdense vessel or unexpected calcification. Mild carotid vascular calcification Skull: Normal. Negative for fracture or focal lesion. Sinuses/Orbits: No acute finding. Other: None IMPRESSION: Negative non  contrasted CT appearance of the brain Electronically Signed   By: Donavan Foil M.D.   On: 05/22/2021 21:08   MR BRAIN WO CONTRAST  Result Date: 05/22/2021 CLINICAL DATA:  Neuro deficit, stroke suspected EXAM: MRI HEAD WITHOUT CONTRAST TECHNIQUE: Multiplanar, multiecho pulse sequences of the brain and surrounding structures were obtained without intravenous contrast. COMPARISON:  09/30/2003 MRI brain. Correlation is also made with CT head 05/22/2021. FINDINGS: Evaluation is  somewhat limited by motion artifact. Brain: No restricted diffusion to suggest acute or subacute infarct. No acute hemorrhage, mass, mass effect, or midline shift. No hydrocephalus or extra-axial collection. T2 hyperintense signal in the periventricular white matter, likely the sequela of chronic small vessel ischemic disease. No foci of hemosiderin deposition to suggest remote hemorrhage. Vascular: Normal flow voids. Skull and upper cervical spine: Normal marrow signal. Evaluation of the cervical spine is limited by motion artifact. Sinuses/Orbits: Negative. Other: Fluid in the bilateral mastoid air cells. IMPRESSION: No acute intracranial process. No evidence of acute or subacute infarct. Electronically Signed   By: Merilyn Baba M.D.   On: 05/22/2021 22:25   CT ABDOMEN PELVIS W CONTRAST  Result Date: 05/22/2021 CLINICAL DATA:  Trauma fall with flank pain EXAM: CT ABDOMEN AND PELVIS WITH CONTRAST TECHNIQUE: Multidetector CT imaging of the abdomen and pelvis was performed using the standard protocol following bolus administration of intravenous contrast. CONTRAST:  31mL OMNIPAQUE IOHEXOL 350 MG/ML SOLN COMPARISON:  CT 12/12/2019 FINDINGS: Lower chest: Lung bases demonstrate patchy ground-glass opacity and mosaicism. This may be due to small airways disease. Atelectasis or scarring at the right middle lobe. No pleural effusion. Hepatobiliary: Status post cholecystectomy. No focal hepatic abnormality. Stable mild enlargement of  extrahepatic common bile duct. Hepatic steatosis Pancreas: Unremarkable. No pancreatic ductal dilatation or surrounding inflammatory changes. Spleen: Normal in size without focal abnormality. Adrenals/Urinary Tract: Adrenal glands are normal. Multifocal cortical scarring within the bilateral kidneys. There are multiple intrarenal stones bilaterally. There is mild hydronephrosis of the left kidney, secondary to a 3 mm stone at the left UPJ. There is an additional 6 mm nonobstructing stone in the left renal pelvis. Urinary bladder unremarkable Stomach/Bowel: Stomach nonenlarged. No dilated small bowel. Negative appendix. No acute bowel wall thickening. Vascular/Lymphatic: Moderate aortic atherosclerosis. No aneurysm. No suspicious nodes Reproductive: Uterus and bilateral adnexa are unremarkable. Other: Negative for pelvic effusion or free air. Musculoskeletal: No acute osseous abnormality. IMPRESSION: 1. Mild left hydronephrosis, secondary to a 3 mm stone at the left UPJ. Multiple bilateral intrarenal stones including nonobstructing 6 mm stone in left renal pelvis. There is multifocal cortical scarring within the bilateral kidneys. 2. Hepatic steatosis Electronically Signed   By: Donavan Foil M.D.   On: 05/22/2021 21:18   DG Chest Portable 1 View  Result Date: 05/22/2021 CLINICAL DATA:  Fall, altered mental status. Fall at home unable to get up. EXAM: PORTABLE CHEST 1 VIEW COMPARISON:  08/26/2020 FINDINGS: The cardiomediastinal contours are unchanged, upper normal heart size likely accentuated by technique. Chronic bronchitic change. Pulmonary vasculature is normal. No consolidation, pleural effusion, or pneumothorax. No acute osseous abnormalities are seen. IMPRESSION: No acute process.  Chronic bronchial thickening. Electronically Signed   By: Keith Rake M.D.   On: 05/22/2021 19:31    EKG: Independently reviewed.  EKG shows normal sinus rhythm with no acute ST elevation or depression.  QTc prolonged at  515  Assessment/Plan Principal Problem:   Complicated UTI  Ms. Lou is admitted to telemetry floor.  Started on Rocephin for antibiotic coverage. IVF hydration Check CBC, electrolytes and renal function in am  Active Problems:   Hydronephrosis due to obstruction of ureter Secondary to obstructing renal stone IV fluid hydration overnight    Kidney stone Patient with 3 mm stone causing mild hydronephrosis.  Urology consulted and is taken to the OR for cystoscopy with retrograde pyelogram and ureteroscopy and stent placement Pain control provided.     HTN (hypertension) Continue Cozaar.  Monitor blood  pressure    Diabetes mellitus type 2 in obese  Continue basal insulin.  Hold metformin for 48 hours after receiving IV contrast for CT scan. Monitor blood sugars with meals and at bedtime.  Corrective insulin provide as needed for glycemic control. Check hemoglobin A1c    Prolonged QT interval Avoid medications which could further prolong QT interval.  Monitor on telemetry  DVT prophylaxis: SCDs for DVT prophylaxis overnight after urologic procedure  Code Status:   Full code  Family Communication:  Diagnosis and plan discussed with patient.  Patient verbalized understanding agrees with plan.  Further recommendations to follow as clinical indicated Disposition Plan:   Patient is from:  Home  Anticipated DC to:  Home  Anticipated DC date:  Anticipate 2 midnight or more stay in the hospital  Consults called:  Urology, Dr. Arita Miss Admission status:  Inpatient   Claudean Severance Keyshia Orwick MD [facility]  How to contact the [facility] Attending or Consulting provider 7A - 7P or covering provider during after hours 7P -7A, for this patient?   Check the care team in [facility] and look for a) attending/consulting [facility] provider listed and b) the [facility] team listed Log into [email or web address] and use [place]'s universal password to access. If you do not have the password, please contact the hospital  operator. Locate the [facility] provider you are looking for under [facility] and page to a number that you can be directly reached. If you still have difficulty reaching the provider, please page the [facility] (Director on Call) for the Hospitalists listed on amion for assistance.  05/23/2021, 1:40 AM

## 2021-05-23 NOTE — Anesthesia Preprocedure Evaluation (Signed)
Anesthesia Evaluation  Patient identified by MRN, date of birth, ID band Patient awake    Reviewed: Allergy & Precautions, NPO status , Patient's Chart, lab work & pertinent test results  Airway Mallampati: II  TM Distance: >3 FB Neck ROM: Full    Dental  (+) Dental Advisory Given   Pulmonary COPD, Current Smoker,    breath sounds clear to auscultation       Cardiovascular hypertension, Pt. on medications +CHF   Rhythm:Regular Rate:Normal  Urosepsis   Neuro/Psych negative neurological ROS     GI/Hepatic negative GI ROS, Neg liver ROS,   Endo/Other  diabetes, Poorly Controlled, Type 2, Insulin Dependent  Renal/GU Renal disease     Musculoskeletal   Abdominal   Peds  Hematology negative hematology ROS (+)   Anesthesia Other Findings   Reproductive/Obstetrics                             Lab Results  Component Value Date   WBC 14.9 (H) 05/22/2021   HGB 16.7 (H) 05/22/2021   HCT 49.6 (H) 05/22/2021   MCV 92.0 05/22/2021   PLT 321 05/22/2021   Lab Results  Component Value Date   CREATININE 0.96 05/22/2021   BUN 17 05/22/2021   NA 141 05/22/2021   K 3.6 05/22/2021   CL 107 05/22/2021   CO2 24 05/22/2021    Anesthesia Physical Anesthesia Plan  ASA: 3 and emergent  Anesthesia Plan: General   Post-op Pain Management: Minimal or no pain anticipated   Induction: Intravenous and Rapid sequence  PONV Risk Score and Plan: 2 and Ondansetron, Dexamethasone and Treatment may vary due to age or medical condition  Airway Management Planned: Oral ETT  Additional Equipment:   Intra-op Plan:   Post-operative Plan: Extubation in OR  Informed Consent: I have reviewed the patients History and Physical, chart, labs and discussed the procedure including the risks, benefits and alternatives for the proposed anesthesia with the patient or authorized representative who has indicated his/her  understanding and acceptance.     Dental advisory given  Plan Discussed with: CRNA  Anesthesia Plan Comments:         Anesthesia Quick Evaluation

## 2021-05-23 NOTE — Progress Notes (Signed)
Urology Inpatient Progress Report    Intv/Subj: Patient underwent stent placement without difficulty early this morning. WBC improving.  Pt resting comfortably.  Principal Problem:   Complicated UTI (urinary tract infection) Active Problems:   HTN (hypertension)   Diabetes mellitus type 2 in obese (HCC)   COPD (chronic obstructive pulmonary disease) (HCC)   Kidney stone   Hydronephrosis due to obstruction of ureter   Prolonged QT interval  Current Facility-Administered Medications  Medication Dose Route Frequency Provider Last Rate Last Admin   acetaminophen (TYLENOL) tablet 650 mg  650 mg Oral Q6H PRN Chotiner, Claudean Severance, MD       Or   acetaminophen (TYLENOL) suppository 650 mg  650 mg Rectal Q6H PRN Chotiner, Claudean Severance, MD       aspirin EC tablet 81 mg  81 mg Oral q morning Chotiner, Claudean Severance, MD       cefTRIAXone (ROCEPHIN) 1 g in sodium chloride 0.9 % 100 mL IVPB  1 g Intravenous Q24H Chotiner, Claudean Severance, MD       fentaNYL (SUBLIMAZE) injection 25-50 mcg  25-50 mcg Intravenous Q5 min PRN Marcene Duos, MD       HYDROmorphone (DILAUDID) injection 1 mg  1 mg Intravenous Q3H PRN Chotiner, Claudean Severance, MD       insulin aspart (novoLOG) injection 0-15 Units  0-15 Units Subcutaneous TID WC Chotiner, Claudean Severance, MD       insulin aspart (novoLOG) injection 0-5 Units  0-5 Units Subcutaneous QHS Chotiner, Claudean Severance, MD       insulin glargine-yfgn (SEMGLEE) injection 25 Units  25 Units Subcutaneous Daily Chotiner, Claudean Severance, MD       iohexol (OMNIPAQUE) 300 MG/ML solution    PRN Kasandra Knudsen D, MD   5 mL at 05/23/21 0050   lactated ringers infusion   Intravenous Continuous Chotiner, Claudean Severance, MD 100 mL/hr at 05/23/21 0612 New Bag at 05/23/21 0612   losartan (COZAAR) tablet 50 mg  50 mg Oral Daily Chotiner, Claudean Severance, MD       rosuvastatin (CRESTOR) tablet 10 mg  10 mg Oral Daily Chotiner, Claudean Severance, MD       sodium chloride irrigation 0.9 %    PRN Kasandra Knudsen D, MD   3,000 mL at  05/23/21 0050   sodium chloride irrigation 0.9 %    PRN Kasandra Knudsen D, MD   1,000 mL at 05/23/21 0050     Objective: Vital: Vitals:   05/23/21 0735 05/23/21 0739 05/23/21 0800 05/23/21 0803  BP:   131/65   Pulse: 89 89 91 92  Resp: 18 17 16 15   Temp:      TempSrc:      SpO2: 98% 94% 98% 98%  Weight:      Height:       I/Os: I/O last 3 completed shifts: In: 1400 [P.O.:300; I.V.:1100] Out: 200 [Urine:200]  Physical Exam:  General: Patient somnolent but arouseable Lungs: Normal respiratory effort, chest expands symmetrically. GI:the abdomen is soft and nontender  Foley: draining concentrated urine with debris  Ext: lower extremities symmetric  Lab Results: Recent Labs    05/22/21 1857 05/23/21 0525  WBC 14.9* 10.4  HGB 16.7* 14.2  HCT 49.6* 42.0   Recent Labs    05/22/21 1857 05/23/21 0525  NA 141 142  K 3.6 3.9  CL 107 110  CO2 24 26  GLUCOSE 85 88  BUN 17 16  CREATININE 0.96 1.07*  CALCIUM 9.6 8.8*   No  results for input(s): LABPT, INR in the last 72 hours. No results for input(s): LABURIN in the last 72 hours. Results for orders placed or performed during the hospital encounter of 05/22/21  Culture, blood (routine x 2)     Status: None (Preliminary result)   Collection Time: 05/22/21  6:57 PM   Specimen: Left Antecubital; Blood  Result Value Ref Range Status   Specimen Description   Final    LEFT ANTECUBITAL Performed at [facility], [address], [place], [place] [address]    Special Requests   Final    BOTTLES DRAWN AEROBIC AND ANAEROBIC Blood Culture adequate volume Performed at [facility], [address] [place], [place] [address]    Culture   Final    NO GROWTH < 12 HOURS Performed at [facility], [address] [place], [place] [address]    Report Status PENDING  Incomplete  Resp Panel by RT-PCR (Flu A&B, Covid) Nasopharyngeal Swab     Status: None   Collection Time: 05/22/21  6:57  PM   Specimen: Nasopharyngeal Swab; Nasopharyngeal(NP) swabs in vial transport medium  Result Value Ref Range Status   SARS Coronavirus 2 by RT PCR NEGATIVE NEGATIVE Final    Comment: (NOTE) SARS-CoV-2 target nucleic acids are NOT DETECTED.  The SARS-CoV-2 RNA is generally detectable in upper respiratory specimens during the acute phase of infection. The lowest concentration of SARS-CoV-2 viral copies this assay can detect is 138 copies/mL. A negative result does not preclude SARS-Cov-2 infection and should not be used as the sole basis for treatment or other patient management decisions. A negative result may occur with  improper specimen collection/handling, submission of specimen other than nasopharyngeal swab, presence of viral mutation(s) within the areas targeted by this assay, and inadequate number of viral copies(<138 copies/mL). A negative result must be combined with clinical observations, patient history, and epidemiological information. The expected result is Negative.  Fact Sheet for Patients:  BloggerCourse.com  Fact Sheet for Healthcare Providers:  SeriousBroker.it  This test is no t yet approved or cleared by the [place] FDA and  has been authorized for detection and/or diagnosis of SARS-CoV-2 by FDA under an Emergency Use Authorization (EUA). This EUA will remain  in effect (meaning this test can be used) for the duration of the COVID-19 declaration under Section 564(b)(1) of the Act, 21 U.S.C.section 360bbb-3(b)(1), unless the authorization is terminated  or revoked sooner.       Influenza A by PCR NEGATIVE NEGATIVE Final   Influenza B by PCR NEGATIVE NEGATIVE Final    Comment: (NOTE) The Xpert Xpress SARS-CoV-2/FLU/RSV plus assay is intended as an aid in the diagnosis of influenza from Nasopharyngeal swab specimens and should not be used as a sole basis for treatment. Nasal washings and aspirates are  unacceptable for Xpert Xpress SARS-CoV-2/FLU/RSV testing.  Fact Sheet for Patients: BloggerCourse.com  Fact Sheet for Healthcare Providers: SeriousBroker.it  This test is not yet approved or cleared by the [place] FDA and has been authorized for detection and/or diagnosis of SARS-CoV-2 by FDA under an Emergency Use Authorization (EUA). This EUA will remain in effect (meaning this test can be used) for the duration of the COVID-19 declaration under Section 564(b)(1) of the Act, 21 U.S.C. section 360bbb-3(b)(1), unless the authorization is terminated or revoked.  Performed at [facility], [address], [place], [place] [address]     Studies/Results: CT Head Wo Contrast  Result Date: 05/22/2021 CLINICAL  DATA:  Dizziness EXAM: CT HEAD WITHOUT CONTRAST TECHNIQUE: Contiguous axial images were obtained from the base of the skull through the vertex without intravenous contrast. COMPARISON:  MRI report 09/30/2003 FINDINGS: Brain: No evidence of acute infarction, hemorrhage, hydrocephalus, extra-axial collection or mass lesion/mass effect. Vascular: No hyperdense vessel or unexpected calcification. Mild carotid vascular calcification Skull: Normal. Negative for fracture or focal lesion. Sinuses/Orbits: No acute finding. Other: None IMPRESSION: Negative non contrasted CT appearance of the brain Electronically Signed   By: [name] Pang M.D.   On: 05/22/2021 21:08   MR BRAIN WO CONTRAST  Result Date: 05/22/2021 CLINICAL DATA:  Neuro deficit, stroke suspected EXAM: MRI HEAD WITHOUT CONTRAST TECHNIQUE: Multiplanar, multiecho pulse sequences of the brain and surrounding structures were obtained without intravenous contrast. COMPARISON:  09/30/2003 MRI brain. Correlation is also made with CT head 05/22/2021. FINDINGS: Evaluation is somewhat limited by motion artifact. Brain: No restricted diffusion to suggest acute or  subacute infarct. No acute hemorrhage, mass, mass effect, or midline shift. No hydrocephalus or extra-axial collection. T2 hyperintense signal in the periventricular white matter, likely the sequela of chronic small vessel ischemic disease. No foci of hemosiderin deposition to suggest remote hemorrhage. Vascular: Normal flow voids. Skull and upper cervical spine: Normal marrow signal. Evaluation of the cervical spine is limited by motion artifact. Sinuses/Orbits: Negative. Other: Fluid in the bilateral mastoid air cells. IMPRESSION: No acute intracranial process. No evidence of acute or subacute infarct. Electronically Signed   By: Wiliam Ke M.D.   On: 05/22/2021 22:25   CT ABDOMEN PELVIS W CONTRAST  Result Date: 05/22/2021 CLINICAL DATA:  Trauma fall with flank pain EXAM: CT ABDOMEN AND PELVIS WITH CONTRAST TECHNIQUE: Multidetector CT imaging of the abdomen and pelvis was performed using the standard protocol following bolus administration of intravenous contrast. CONTRAST:  47mL OMNIPAQUE IOHEXOL 350 MG/ML SOLN COMPARISON:  CT 12/12/2019 FINDINGS: Lower chest: Lung bases demonstrate patchy ground-glass opacity and mosaicism. This may be due to small airways disease. Atelectasis or scarring at the right middle lobe. No pleural effusion. Hepatobiliary: Status post cholecystectomy. No focal hepatic abnormality. Stable mild enlargement of extrahepatic common bile duct. Hepatic steatosis Pancreas: Unremarkable. No pancreatic ductal dilatation or surrounding inflammatory changes. Spleen: Normal in size without focal abnormality. Adrenals/Urinary Tract: Adrenal glands are normal. Multifocal cortical scarring within the bilateral kidneys. There are multiple intrarenal stones bilaterally. There is mild hydronephrosis of the left kidney, secondary to a 3 mm stone at the left UPJ. There is an additional 6 mm nonobstructing stone in the left renal pelvis. Urinary bladder unremarkable Stomach/Bowel: Stomach  nonenlarged. No dilated small bowel. Negative appendix. No acute bowel wall thickening. Vascular/Lymphatic: Moderate aortic atherosclerosis. No aneurysm. No suspicious nodes Reproductive: Uterus and bilateral adnexa are unremarkable. Other: Negative for pelvic effusion or free air. Musculoskeletal: No acute osseous abnormality. IMPRESSION: 1. Mild left hydronephrosis, secondary to a 3 mm stone at the left UPJ. Multiple bilateral intrarenal stones including nonobstructing 6 mm stone in left renal pelvis. There is multifocal cortical scarring within the bilateral kidneys. 2. Hepatic steatosis Electronically Signed   By: [name] Pang M.D.   On: 05/22/2021 21:18   DG Chest Portable 1 View  Result Date: 05/22/2021 CLINICAL DATA:  Fall, altered mental status. Fall at home unable to get up. EXAM: PORTABLE CHEST 1 VIEW COMPARISON:  08/26/2020 FINDINGS: The cardiomediastinal contours are unchanged, upper normal heart size likely accentuated by technique. Chronic bronchitic change. Pulmonary vasculature is normal. No consolidation, pleural effusion, or pneumothorax. No acute osseous  abnormalities are seen. IMPRESSION: No acute process.  Chronic bronchial thickening. Electronically Signed   By: Narda Rutherford M.D.   On: 05/22/2021 19:31   DG C-Arm 1-60 Min-No Report  Result Date: 05/23/2021 Fluoroscopy was utilized by the requesting physician.  No radiographic interpretation.    Assessment/Plan: Ureteral calculus with UTI -POD0 s/p left ureteral stent -pt will need continued treatment with antibiotics for complicated UTI -will need additional surgery to remove kidney stone (she follows at [facility]) -continue foley while infectious debris present, can remove tomorrow AM  Kasandra Knudsen, MD Urology 05/23/2021, 8:39 AM

## 2021-05-23 NOTE — Progress Notes (Signed)
Verify order for potassium and magnesium order, not need it. And also rocephine given at ER, next dose can give this afternoon per Dr. Elige Radon.

## 2021-05-23 NOTE — Interval H&P Note (Signed)
History and Physical Interval Note:  05/23/2021 1:17 AM  [name]  has presented today for surgery, with the diagnosis of stone extraction.  The various methods of treatment have been discussed with the patient and family. After consideration of risks, benefits and other options for treatment, the patient has consented to  Procedure(s): CYSTOSCOPY WITH RETROGRADE PYELOGRAM, URETEROSCOPY AND STENT PLACEMENT (Left) as a surgical intervention.  The patient's history has been reviewed, patient examined, no change in status, stable for surgery.  I have reviewed the patient's chart and labs.  Questions were answered to the patient's satisfaction.     Stephanny Tsutsui D Airica Schwartzkopf

## 2021-05-23 NOTE — Op Note (Signed)
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  EBL: Minimal  Specimens: None  Indication: [name] is a 60 y.o. patient with 3 mm left proximal ureteral calculus with early signs of infection. After reviewing the management options for treatment, he elected to proceed with the above surgical procedure(s). We have discussed the potential benefits and risks of the procedure, side effects of the proposed treatment, the likelihood of the patient achieving the goals of the procedure, and any potential problems that might occur during the procedure or recuperation. Informed consent has been obtained.  Description of procedure:  The patient was taken to the operating room and general anesthesia was induced.  The patient was placed in the dorsal lithotomy position, prepped and draped in the usual sterile fashion, and preoperative antibiotics were administered. A preoperative time-out was performed.   Cystourethroscopy was performed.  The patient's urethra was examined and was normal. The bladder was then systematically examined in its entirety. There was no evidence for any bladder tumors, stones, or other mucosal pathology.    Attention then turned to the leftureteral orifice and a ureteral catheter was used to intubate the ureteral orifice.  Omnipaque contrast was injected through the ureteral catheter and a retrograde  pyelogram was performed with findings as dictated above.  A 0.38 sensor guidewire was then advanced through the open ended ureteral catheter and up the left ureter into the renal pelvis under fluoroscopic guidance.  The open ended catheter was removed. Next, a 6Fr x 24 cm JJ ureteral stent was placed over the wire through the cystoscope. The stent was positioned appropriately under fluoroscopic and cystoscopic guidance.  The wire was then removed with an adequate stent curl noted in the renal pelvis as well as in the bladder.  The bladder was then emptied and the procedure ended.  The patient appeared to tolerate the procedure well and without complications.  The patient was able to be awakened and transferred to the recovery unit in satisfactory condition.   Plan: Keep foley overnight to allow for maximal drainage. Pt to be admitted for antibiotics and observation.  She will need staged ureteroscopy to address ureteral and renal calculi in the future.   Kasandra Knudsen, M.D.

## 2021-05-24 LAB — BASIC METABOLIC PANEL
Anion gap: 7 (ref 5–15)
BUN: 15 mg/dL (ref 6–20)
CO2: 25 mmol/L (ref 22–32)
Calcium: 8.9 mg/dL (ref 8.9–10.3)
Chloride: 110 mmol/L (ref 98–111)
Creatinine, Ser: 0.99 mg/dL (ref 0.44–1.00)
GFR, Estimated: >60 mL/min (ref >60)
Glucose, Bld: 88 mg/dL (ref 70–99)
Potassium: 3.3 mmol/L — ABNORMAL LOW (ref 3.5–5.1)
Sodium: 142 mmol/L (ref 135–145)

## 2021-05-24 LAB — MAGNESIUM: Magnesium: 2 mg/dL (ref 1.7–2.4)

## 2021-05-24 LAB — CBC
HCT: 38.6 % (ref 36.0–46.0)
Hemoglobin: 12.8 g/dL (ref 12.0–15.0)
MCH: 31.4 pg (ref 26.0–34.0)
MCHC: 33.2 g/dL (ref 30.0–36.0)
MCV: 94.8 fL (ref 80.0–100.0)
Platelets: 254 10*3/uL (ref 150–400)
RBC: 4.07 MIL/uL (ref 3.87–5.11)
RDW: 12.5 % (ref 11.5–15.5)
WBC: 9.3 10*3/uL (ref 4.0–10.5)
nRBC: 0 % (ref 0.0–0.2)

## 2021-05-24 LAB — GLUCOSE, CAPILLARY
Glucose-Capillary: 79 mg/dL (ref 70–99)
Glucose-Capillary: 116 mg/dL — ABNORMAL HIGH (ref 70–99)
Glucose-Capillary: 102 mg/dL — ABNORMAL HIGH (ref 70–99)

## 2021-05-24 MED ORDER — POTASSIUM CHLORIDE CRYS ER 20 MEQ PO TBCR: 40.0000 meq | EXTENDED_RELEASE_TABLET | Freq: Once | ORAL | Status: DC

## 2021-05-24 MED ORDER — CEFDINIR 300 MG PO CAPS: 300.0000 mg | ORAL_CAPSULE | Freq: Two times a day (BID) | ORAL | 0 refills | Status: AC

## 2021-05-24 MED ORDER — SODIUM CHLORIDE 0.9 % IV SOLN
1.0000 g | Freq: Once | INTRAVENOUS | Status: DC
  Filled 2021-05-24: qty 10

## 2021-05-24 NOTE — Plan of Care (Signed)
  Problem: Health Behavior/Discharge Planning: Goal: Ability to manage health-related needs will improve Outcome: Progressing   Problem: Clinical Measurements: Goal: Ability to maintain clinical measurements within normal limits will improve Outcome: Progressing   Problem: Clinical Measurements: Goal: Ability to maintain clinical measurements within normal limits will improve Outcome: Progressing Goal: Respiratory complications will improve Outcome: Progressing

## 2021-05-24 NOTE — Progress Notes (Signed)
Patient vital signs stable. IV removed. RN reviewed discharge paperwork with patient who stated understanding.  NT transported patient via wheelchair to exit.

## 2021-05-24 NOTE — Plan of Care (Signed)

## 2021-05-24 NOTE — TOC Initial Note (Signed)
Transition of Care [facility]) - Initial/Assessment Note    Patient Details  Name: [name] MRN: [id number] Date of Birth: 20-Oct-1960  Transition of Care [facility]) CM/SW Contact:    Leeroy Cha, RN Phone Number: 05/24/2021, 10:38 AM  Clinical Narrative:                  60 y.o. female with medical history significant for DMT2, HTN, HLD who presents for evaluation of generalized weakness and malaise.  She reports for the last 2 to 3 days she has not been feeling well and has generally weaker.  She reports that on the morning of May 22, 2021 she fell out of bed and was unable to get up secondary to weakness.  She stayed in the ground until her family came by and checked on her later in the day and she was brought to the emergency room.  She reports she has been having urinary hesitancy/frequency with decreased amount of urine volume for her.  She also reports having dysuria over the past day or 2.  States she has not had any fever or chills.  She has not had any cough, shortness of breath, chest pain, headache, nausea vomiting or diarrhea.  She has had low back pain in the left side over the last few days.  Reportedly her family told the ER physician that she was seemed more confused over the last few days.  No family is at bedside at this time   ED Course: [name] initial temperature of 95.1 degrees.  She is been hemodynamically stable in the emergency room.  Temperature has improved to 98.5 degrees.  She is found to have a 3 mm left renal stone obstructing her ureter causing mild hydronephrosis.  Lab work shows a WBC of 14,900 hemoglobin 16.7 hematocrit 49.6 platelets 321,000.  Patient lactic acid level 2.0.  Lactic acid improved to 1.4 after IV fluid hydration.  Troponin negative at 10 2 occasions in the emergency room.  CMP is unremarkable.  Has been consulted and Dr. Claudia Desanctis is taken to the OR tonight for removal of stone and stent placement.  Patient was started on antibiotics in the  emergency room.  TOC PLAN OF CARE: FOLLOWING FOR HOME NEEDS NONE PRESENT AT THIS TIME FOLLOWING FOR PROGRESSION. Expected Discharge Plan: Home/Self Care Barriers to Discharge: Continued Medical Work up   Patient Goals and CMS Choice Patient states their goals for this hospitalization and ongoing recovery are:: to go home CMS Medicare.gov Compare Post Acute Care list provided to:: Patient    Expected Discharge Plan and Services Expected Discharge Plan: Home/Self Care   Discharge Planning Services: CM Consult   Living arrangements for the past 2 months: Single Family Home                                      Prior Living Arrangements/Services Living arrangements for the past 2 months: Single Family Home Lives with:: Self Patient language and need for interpreter reviewed:: Yes Do you feel safe going back to the place where you live?: Yes            Criminal Activity/Legal Involvement Pertinent to Current Situation/Hospitalization: No - Comment as needed  Activities of Daily Living Home Assistive Devices/Equipment: None ADL Screening (condition at time of admission) Patient's cognitive ability adequate to safely complete daily activities?: Yes Is the patient deaf or have difficulty hearing?: No  Does the patient have difficulty seeing, even when wearing glasses/contacts?: No Does the patient have difficulty concentrating, remembering, or making decisions?: No Patient able to express need for assistance with ADLs?: No Does the patient have difficulty dressing or bathing?: No Independently performs ADLs?: Yes (appropriate for developmental age) Does the patient have difficulty walking or climbing stairs?: Yes Weakness of Legs: Both Weakness of Arms/Hands: None  Permission Sought/Granted                  Emotional Assessment Appearance:: Appears stated age Attitude/Demeanor/Rapport: Engaged Affect (typically observed): Calm Orientation: : Oriented to Self,  Oriented to Place, Oriented to  Time, Oriented to Situation Alcohol / Substance Use: Not Applicable Psych Involvement: No (comment)  Admission diagnosis:  Kidney stone [N20.0] Acute cystitis with hematuria [N30.01] Complicated UTI (urinary tract infection) [N39.0] Patient Active Problem List   Diagnosis Date Noted   Complicated UTI (urinary tract infection) 05/23/2021   Kidney stone 05/23/2021   Hydronephrosis due to obstruction of ureter 05/23/2021   Prolonged QT interval 05/23/2021   Angina pectoris (HCC) 08/26/2020   Angina at rest (HCC) 08/26/2020   Microalbuminuria due to type 2 diabetes mellitus (HCC) 01/22/2020   Bacteremia due to Escherichia coli    Sepsis (HCC) 12/12/2019   Hip pain 08/10/2019   Healthcare maintenance 03/31/2015   History of CHF (congestive heart failure) 07/16/2013   COPD (chronic obstructive pulmonary disease) (HCC) 07/16/2013   Diabetes mellitus type 2 in obese (HCC) 06/11/2013   Recurrent kidney stones 10/03/2012   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   Urinary tract infection 11/12/2011   Stage 2 chronic kidney disease 11/12/2011   Morbid obesity (HCC) 11/08/2011   Knee pain 03/19/2011   Type 2 diabetes mellitus with complication (HCC) 09/19/2010   Hyperlipidemia 09/19/2010   Bipolar affective disorder (HCC) 09/09/2006   Major depressive disorder, recurrent episode (HCC) 08/29/2006   TOBACCO DEPENDENCE 08/29/2006   HTN (hypertension) 08/29/2006   Chronic venous insufficiency 08/29/2006   PCP:  Shirlean Mylar, MD Pharmacy:   [facility] [id number] - [place], [place] - [address] [place] [address] Phone: [phone] Fax: [phone]  [facility] [address] [place] [address] Phone: [phone] Fax: [phone]     Social Determinants of Health (SDOH) Interventions    Readmission Risk Interventions No flowsheet data found.

## 2021-05-24 NOTE — Discharge Summary (Signed)
Physician Discharge Summary  [name] KDX:[id number] DOB: 1961-03-25 DOA: 05/22/2021  PCP: Shirlean Mylar, MD  Admit date: 05/22/2021 Discharge date: 05/24/2021  Admitted From: Home Disposition: Home  Recommendations for Outpatient Follow-up:  Follow up with PCP in 1 week Follow up with urology Please obtain BMP/CBC in one week Please follow up on the following pending results: Blood cultures (final results)  Home Health: None Equipment/Devices: None  Discharge Condition: Stable CODE STATUS: Full code Diet recommendation: Carb modified   Brief/Interim Summary:  Admission HPI written by Carlton Adam, MD   HPI: [name] is a 60 y.o. female with medical history significant for DMT2, HTN, HLD who presents for evaluation of generalized weakness and malaise.  She reports for the last 2 to 3 days she has not been feeling well and has generally weaker.  She reports that on the morning of May 22, 2021 she fell out of bed and was unable to get up secondary to weakness.  She stayed in the ground until her family came by and checked on her later in the day and she was brought to the emergency room.  She reports she has been having urinary hesitancy/frequency with decreased amount of urine volume for her.  She also reports having dysuria over the past day or 2.  States she has not had any fever or chills.  She has not had any cough, shortness of breath, chest pain, headache, nausea vomiting or diarrhea.  She has had low back pain in the left side over the last few days.  Reportedly her family told the ER physician that she was seemed more confused over the last few days.  No family is at bedside at this time  Hospital course:  Complicated UTI Left hydronephrosis with obstruction Nephrolithiasis Patient was started empirically on Ceftriaxone IV. Urology consulted and left ureteral stent was placed on 11/22, along with foley catheter. The following day,  foley catheter was removed. No urine cultures were obtained. Patient was transitioned to Cefdinir on discharge to complete a total of 7 days of antibiotics. Blood cultures with no growth at time of discharge.  Primary hypertension Continue losartan.  Hyperlipidemia Continue Crestor.  Diabetes mellitus, type 2 Continue home regimen.  Discharge Diagnoses:  Principal Problem:   Complicated UTI (urinary tract infection) Active Problems:   HTN (hypertension)   Diabetes mellitus type 2 in obese (HCC)   COPD (chronic obstructive pulmonary disease) (HCC)   Kidney stone   Hydronephrosis due to obstruction of ureter   Prolonged QT interval    Discharge Instructions  Discharge Instructions     Call MD for:  severe uncontrolled pain   Complete by: As directed    Call MD for:  temperature >100.4   Complete by: As directed       Allergies as of 05/24/2021       Reactions   Tamsulosin Shortness Of Breath        Medication List     STOP taking these medications    hydrocortisone cream 0.5 %   metFORMIN 500 MG tablet Commonly known as: GLUCOPHAGE       TAKE these medications    acetaminophen 500 MG tablet Commonly known as: TYLENOL Take 1,000 mg by mouth every 6 (six) hours as needed for mild pain.   aspirin 81 MG EC tablet Take 81 mg by mouth every morning.   cefdinir 300 MG capsule Commonly known as: OMNICEF Take 1 capsule (300 mg total) by  mouth 2 (two) times daily for 7 days. Start taking on: May 25, 2021   insulin glargine 100 UNIT/ML Solostar Pen Commonly known as: LANTUS Inject 25 Units into the skin every morning.   losartan 50 MG tablet Commonly known as: COZAAR TAKE ONE TABLET BY MOUTH DAILY   multivitamin with minerals Tabs tablet Take 1 tablet by mouth every morning.   nitroGLYCERIN 0.4 MG SL tablet Commonly known as: Nitrostat Place 1 tablet (0.4 mg total) under the tongue every 5 (five) minutes as needed for chest pain. If after 1  tablet and 5 minutes the chest pain is still present, go to emergency room.   Novofine Pen Needle 32G X 6 MM Misc Generic drug: Insulin Pen Needle 1 each by Does not apply route daily.   rosuvastatin 10 MG tablet Commonly known as: CRESTOR TAKE ONE TABLET BY MOUTH DAILY   TRESIBA FLEXTOUCH [place] Inject 3 Units into the skin once a week. Wednesdays        Follow-up Information     Timothy Lasso, MD. Call.   Specialty: Urology Contact information: [address] [place] [address] [phone]         Noel Christmas, MD. Call.   Specialty: Urology Why: if you cannot get in with your regular urologist Contact information: [address] [place] [address] [phone]         Shirlean Mylar, MD. Schedule an appointment as soon as possible for a visit in 1 week(s).   Specialty: Family Medicine Why: For hospital follow-up Contact information: [address] [place] [address] [phone]                Allergies  Allergen Reactions   Tamsulosin Shortness Of Breath    Consultations: Urology   Procedures/Studies: CT Head Wo Contrast  Result Date: 05/22/2021 CLINICAL DATA:  Dizziness EXAM: CT HEAD WITHOUT CONTRAST TECHNIQUE: Contiguous axial images were obtained from the base of the skull through the vertex without intravenous contrast. COMPARISON:  MRI report 09/30/2003 FINDINGS: Brain: No evidence of acute infarction, hemorrhage, hydrocephalus, extra-axial collection or mass lesion/mass effect. Vascular: No hyperdense vessel or unexpected calcification. Mild carotid vascular calcification Skull: Normal. Negative for fracture or focal lesion. Sinuses/Orbits: No acute finding. Other: None IMPRESSION: Negative non contrasted CT appearance of the brain Electronically Signed   By: [name] Pang M.D.   On: 05/22/2021 21:08   MR BRAIN WO CONTRAST  Result Date: 05/22/2021 CLINICAL DATA:  Neuro deficit, stroke suspected EXAM:  MRI HEAD WITHOUT CONTRAST TECHNIQUE: Multiplanar, multiecho pulse sequences of the brain and surrounding structures were obtained without intravenous contrast. COMPARISON:  09/30/2003 MRI brain. Correlation is also made with CT head 05/22/2021. FINDINGS: Evaluation is somewhat limited by motion artifact. Brain: No restricted diffusion to suggest acute or subacute infarct. No acute hemorrhage, mass, mass effect, or midline shift. No hydrocephalus or extra-axial collection. T2 hyperintense signal in the periventricular white matter, likely the sequela of chronic small vessel ischemic disease. No foci of hemosiderin deposition to suggest remote hemorrhage. Vascular: Normal flow voids. Skull and upper cervical spine: Normal marrow signal. Evaluation of the cervical spine is limited by motion artifact. Sinuses/Orbits: Negative. Other: Fluid in the bilateral mastoid air cells. IMPRESSION: No acute intracranial process. No evidence of acute or subacute infarct. Electronically Signed   By: Wiliam Ke M.D.   On: 05/22/2021 22:25   CT ABDOMEN PELVIS W CONTRAST  Result Date: 05/22/2021 CLINICAL DATA:  Trauma fall with flank  pain EXAM: CT ABDOMEN AND PELVIS WITH CONTRAST TECHNIQUE: Multidetector CT imaging of the abdomen and pelvis was performed using the standard protocol following bolus administration of intravenous contrast. CONTRAST:  79mL OMNIPAQUE IOHEXOL 350 MG/ML SOLN COMPARISON:  CT 12/12/2019 FINDINGS: Lower chest: Lung bases demonstrate patchy ground-glass opacity and mosaicism. This may be due to small airways disease. Atelectasis or scarring at the right middle lobe. No pleural effusion. Hepatobiliary: Status post cholecystectomy. No focal hepatic abnormality. Stable mild enlargement of extrahepatic common bile duct. Hepatic steatosis Pancreas: Unremarkable. No pancreatic ductal dilatation or surrounding inflammatory changes. Spleen: Normal in size without focal abnormality. Adrenals/Urinary Tract: Adrenal  glands are normal. Multifocal cortical scarring within the bilateral kidneys. There are multiple intrarenal stones bilaterally. There is mild hydronephrosis of the left kidney, secondary to a 3 mm stone at the left UPJ. There is an additional 6 mm nonobstructing stone in the left renal pelvis. Urinary bladder unremarkable Stomach/Bowel: Stomach nonenlarged. No dilated small bowel. Negative appendix. No acute bowel wall thickening. Vascular/Lymphatic: Moderate aortic atherosclerosis. No aneurysm. No suspicious nodes Reproductive: Uterus and bilateral adnexa are unremarkable. Other: Negative for pelvic effusion or free air. Musculoskeletal: No acute osseous abnormality. IMPRESSION: 1. Mild left hydronephrosis, secondary to a 3 mm stone at the left UPJ. Multiple bilateral intrarenal stones including nonobstructing 6 mm stone in left renal pelvis. There is multifocal cortical scarring within the bilateral kidneys. 2. Hepatic steatosis Electronically Signed   By: [name] Pang M.D.   On: 05/22/2021 21:18   DG Chest Portable 1 View  Result Date: 05/22/2021 CLINICAL DATA:  Fall, altered mental status. Fall at home unable to get up. EXAM: PORTABLE CHEST 1 VIEW COMPARISON:  08/26/2020 FINDINGS: The cardiomediastinal contours are unchanged, upper normal heart size likely accentuated by technique. Chronic bronchitic change. Pulmonary vasculature is normal. No consolidation, pleural effusion, or pneumothorax. No acute osseous abnormalities are seen. IMPRESSION: No acute process.  Chronic bronchial thickening. Electronically Signed   By: Narda Rutherford M.D.   On: 05/22/2021 19:31   DG C-Arm 1-60 Min-No Report  Result Date: 05/23/2021 Fluoroscopy was utilized by the requesting physician.  No radiographic interpretation.    UROLOGY PROCEDURE (05/23/2021) Cystoscopy left ureteral stent placement - 6Fr x 24 cm no tether left retrograde pyelography with interpretation    Subjective: No concerns this  morning.  Discharge Exam: Vitals:   05/24/21 0324 05/24/21 1357  BP: 138/68 (!) 149/65  Pulse: 93 87  Resp: 14 18  Temp: 99 F (37.2 C) 97.8 F (36.6 C)  SpO2: 95% 93%   Vitals:   05/23/21 1755 05/23/21 2127 05/24/21 0324 05/24/21 1357  BP: (!) 122/52 (!) 129/57 138/68 (!) 149/65  Pulse: 96 92 93 87  Resp: 16 16 14 18   Temp:  98.1 F (36.7 C) 99 F (37.2 C) 97.8 F (36.6 C)  TempSrc:  Oral Oral Oral  SpO2: 96% 97% 95% 93%  Weight:      Height:        General: Pt is alert, awake, not in acute distress Cardiovascular: RRR, S1/S2 +, no rubs, no gallops Respiratory: CTA bilaterally, no wheezing, no rhonchi Abdominal: Soft, NT, ND, bowel sounds + Extremities: no edema, no cyanosis    The results of significant diagnostics from this hospitalization (including imaging, microbiology, ancillary and laboratory) are listed below for reference.     Microbiology: Recent Results (from the past 240 hour(s))  Culture, blood (routine x 2)     Status: None (Preliminary result)   Collection Time:  05/22/21  6:57 PM   Specimen: Left Antecubital; Blood  Result Value Ref Range Status   Specimen Description   Final    LEFT ANTECUBITAL Performed at [facility], [address], [place], [place] [address]    Special Requests   Final    BOTTLES DRAWN AEROBIC AND ANAEROBIC Blood Culture adequate volume Performed at [facility], [address] [place], [place] [address]    Culture   Final    NO GROWTH 2 DAYS Performed at [facility], [address], [place], [place] [address]    Report Status PENDING  Incomplete  Resp Panel by RT-PCR (Flu A&B, Covid) Nasopharyngeal Swab     Status: None   Collection Time: 05/22/21  6:57 PM   Specimen: Nasopharyngeal Swab; Nasopharyngeal(NP) swabs in vial transport medium  Result Value Ref Range Status   SARS Coronavirus 2 by RT PCR NEGATIVE NEGATIVE Final    Comment: (NOTE) SARS-CoV-2 target nucleic  acids are NOT DETECTED.  The SARS-CoV-2 RNA is generally detectable in upper respiratory specimens during the acute phase of infection. The lowest concentration of SARS-CoV-2 viral copies this assay can detect is 138 copies/mL. A negative result does not preclude SARS-Cov-2 infection and should not be used as the sole basis for treatment or other patient management decisions. A negative result may occur with  improper specimen collection/handling, submission of specimen other than nasopharyngeal swab, presence of viral mutation(s) within the areas targeted by this assay, and inadequate number of viral copies(<138 copies/mL). A negative result must be combined with clinical observations, patient history, and epidemiological information. The expected result is Negative.  Fact Sheet for Patients:  BloggerCourse.com  Fact Sheet for Healthcare Providers:  SeriousBroker.it  This test is no t yet approved or cleared by the [place] FDA and  has been authorized for detection and/or diagnosis of SARS-CoV-2 by FDA under an Emergency Use Authorization (EUA). This EUA will remain  in effect (meaning this test can be used) for the duration of the COVID-19 declaration under Section 564(b)(1) of the Act, 21 U.S.C.section 360bbb-3(b)(1), unless the authorization is terminated  or revoked sooner.       Influenza A by PCR NEGATIVE NEGATIVE Final   Influenza B by PCR NEGATIVE NEGATIVE Final    Comment: (NOTE) The Xpert Xpress SARS-CoV-2/FLU/RSV plus assay is intended as an aid in the diagnosis of influenza from Nasopharyngeal swab specimens and should not be used as a sole basis for treatment. Nasal washings and aspirates are unacceptable for Xpert Xpress SARS-CoV-2/FLU/RSV testing.  Fact Sheet for Patients: BloggerCourse.com  Fact Sheet for Healthcare Providers: SeriousBroker.it  This  test is not yet approved or cleared by the [place] FDA and has been authorized for detection and/or diagnosis of SARS-CoV-2 by FDA under an Emergency Use Authorization (EUA). This EUA will remain in effect (meaning this test can be used) for the duration of the COVID-19 declaration under Section 564(b)(1) of the Act, 21 U.S.C. section 360bbb-3(b)(1), unless the authorization is terminated or revoked.  Performed at [facility], [address], [place], [place] [address]   Culture, blood (routine x 2)     Status: None (Preliminary result)   Collection Time: 05/23/21  5:18 AM   Specimen: BLOOD  Result Value Ref Range Status   Specimen Description   Final    BLOOD RIGHT ANTECUBITAL Performed at [facility], [address], [place], [place] [address]    Special Requests  Final    BOTTLES DRAWN AEROBIC ONLY Blood Culture adequate volume Performed at [facility], [address], [place], [place] [address]    Culture   Final    NO GROWTH 1 DAY Performed at [facility], [address], [place], [place] [address]    Report Status PENDING  Incomplete     Labs: BNP (last 3 results) Recent Labs    08/26/20 1212  BNP 25.5   Basic Metabolic Panel: Recent Labs  Lab 05/22/21 1857 05/22/21 2259 05/23/21 0525 05/24/21 0359  NA 141  --  142 142  K 3.6  --  3.9 3.3*  CL 107  --  110 110  CO2 24  --  26 25  GLUCOSE 85  --  88 88  BUN 17  --  16 15  CREATININE 0.96  --  1.07* 0.99  CALCIUM 9.6  --  8.8* 8.9  MG  --  1.8  --  2.0   Liver Function Tests: Recent Labs  Lab 05/22/21 1857  AST 25  ALT 20  ALKPHOS 102  BILITOT 0.8  PROT 8.1  ALBUMIN 4.3   No results for input(s): LIPASE, AMYLASE in the last 168 hours. No results for input(s): AMMONIA in the last 168 hours. CBC: Recent Labs  Lab 05/22/21 1857 05/23/21 0525 05/24/21 0359  WBC 14.9* 10.4 9.3  NEUTROABS 10.8*  --   --   HGB 16.7* 14.2  12.8  HCT 49.6* 42.0 38.6  MCV 92.0 93.8 94.8  PLT 321 155 254   Cardiac Enzymes: No results for input(s): CKTOTAL, CKMB, CKMBINDEX, TROPONINI in the last 168 hours. BNP: Invalid input(s): POCBNP CBG: Recent Labs  Lab 05/23/21 1156 05/23/21 1651 05/23/21 2124 05/24/21 0722 05/24/21 1154  GLUCAP 119* 76 102* 79 116*   D-Dimer No results for input(s): DDIMER in the last 72 hours. Hgb A1c Recent Labs    05/23/21 0525  HGBA1C 5.7*   Lipid Profile No results for input(s): CHOL, HDL, LDLCALC, TRIG, CHOLHDL, LDLDIRECT in the last 72 hours. Thyroid function studies No results for input(s): TSH, T4TOTAL, T3FREE, THYROIDAB in the last 72 hours.  Invalid input(s): FREET3 Anemia work up No results for input(s): VITAMINB12, FOLATE, FERRITIN, TIBC, IRON, RETICCTPCT in the last 72 hours. Urinalysis    Component Value Date/Time   COLORURINE YELLOW 05/22/2021 2240   APPEARANCEUR HAZY (A) 05/22/2021 2240   LABSPEC 1.045 (H) 05/22/2021 2240   PHURINE 6.0 05/22/2021 2240   GLUCOSEU NEGATIVE 05/22/2021 2240   HGBUR LARGE (A) 05/22/2021 2240   HGBUR large 07/25/2009 1125   BILIRUBINUR NEGATIVE 05/22/2021 2240   BILIRUBINUR negative 03/02/2019 0905   BILIRUBINUR NEG 11/19/2014 0856   KETONESUR 20 (A) 05/22/2021 2240   PROTEINUR NEGATIVE 05/22/2021 2240   UROBILINOGEN 0.2 03/02/2019 0905   UROBILINOGEN 1.0 05/22/2013 1659   NITRITE POSITIVE (A) 05/22/2021 2240   LEUKOCYTESUR LARGE (A) 05/22/2021 2240   Sepsis Labs Invalid input(s): PROCALCITONIN,  WBC,  LACTICIDVEN Microbiology Recent Results (from the past 240 hour(s))  Culture, blood (routine x 2)     Status: None (Preliminary result)   Collection Time: 05/22/21  6:57 PM   Specimen: Left Antecubital; Blood  Result Value Ref Range Status   Specimen Description   Final    LEFT ANTECUBITAL Performed at [facility], [address], [place], [place] [address]    Special Requests   Final    BOTTLES DRAWN  AEROBIC AND ANAEROBIC Blood Culture adequate  volume Performed at [facility], [address] [place], [place] [address]    Culture   Final    NO GROWTH 2 DAYS Performed at [facility], [address] [place], [place] [address]    Report Status PENDING  Incomplete  Resp Panel by RT-PCR (Flu A&B, Covid) Nasopharyngeal Swab     Status: None   Collection Time: 05/22/21  6:57 PM   Specimen: Nasopharyngeal Swab; Nasopharyngeal(NP) swabs in vial transport medium  Result Value Ref Range Status   SARS Coronavirus 2 by RT PCR NEGATIVE NEGATIVE Final    Comment: (NOTE) SARS-CoV-2 target nucleic acids are NOT DETECTED.  The SARS-CoV-2 RNA is generally detectable in upper respiratory specimens during the acute phase of infection. The lowest concentration of SARS-CoV-2 viral copies this assay can detect is 138 copies/mL. A negative result does not preclude SARS-Cov-2 infection and should not be used as the sole basis for treatment or other patient management decisions. A negative result may occur with  improper specimen collection/handling, submission of specimen other than nasopharyngeal swab, presence of viral mutation(s) within the areas targeted by this assay, and inadequate number of viral copies(<138 copies/mL). A negative result must be combined with clinical observations, patient history, and epidemiological information. The expected result is Negative.  Fact Sheet for Patients:  BloggerCourse.com  Fact Sheet for Healthcare Providers:  SeriousBroker.it  This test is no t yet approved or cleared by the [place] FDA and  has been authorized for detection and/or diagnosis of SARS-CoV-2 by FDA under an Emergency Use Authorization (EUA). This EUA will remain  in effect (meaning this test can be used) for the duration of the COVID-19 declaration under Section 564(b)(1) of the Act, 21 U.S.C.section  360bbb-3(b)(1), unless the authorization is terminated  or revoked sooner.       Influenza A by PCR NEGATIVE NEGATIVE Final   Influenza B by PCR NEGATIVE NEGATIVE Final    Comment: (NOTE) The Xpert Xpress SARS-CoV-2/FLU/RSV plus assay is intended as an aid in the diagnosis of influenza from Nasopharyngeal swab specimens and should not be used as a sole basis for treatment. Nasal washings and aspirates are unacceptable for Xpert Xpress SARS-CoV-2/FLU/RSV testing.  Fact Sheet for Patients: BloggerCourse.com  Fact Sheet for Healthcare Providers: SeriousBroker.it  This test is not yet approved or cleared by the [place] FDA and has been authorized for detection and/or diagnosis of SARS-CoV-2 by FDA under an Emergency Use Authorization (EUA). This EUA will remain in effect (meaning this test can be used) for the duration of the COVID-19 declaration under Section 564(b)(1) of the Act, 21 U.S.C. section 360bbb-3(b)(1), unless the authorization is terminated or revoked.  Performed at [facility], [address] [place], [place] [address]   Culture, blood (routine x 2)     Status: None (Preliminary result)   Collection Time: 05/23/21  5:18 AM   Specimen: BLOOD  Result Value Ref Range Status   Specimen Description   Final    BLOOD RIGHT ANTECUBITAL Performed at [facility], [address], [place], [place] [address]    Special Requests   Final    BOTTLES DRAWN AEROBIC ONLY Blood Culture adequate volume Performed at [facility], [address] [place], [place] [address]    Culture   Final    NO GROWTH 1 DAY Performed at [facility], [address] [place], [place] [address]    Report Status PENDING  Incomplete  Time coordinating discharge: 35 minutes  SIGNED:   Jacquelin Hawking, MD [facility] 05/24/2021, 4:18 PM

## 2021-05-27 LAB — CULTURE, BLOOD (ROUTINE X 2)
Culture: NO GROWTH
Special Requests: ADEQUATE

## 2021-05-28 LAB — CULTURE, BLOOD (ROUTINE X 2)
Culture: NO GROWTH
Special Requests: ADEQUATE

## 2021-06-05 NOTE — Progress Notes (Signed)
    SUBJECTIVE:   CHIEF COMPLAINT / HPI:   Hospital follow-up: 60 year old female present for hospital follow-up after being admitted for complicated UTI with renal stone and hematuria.  Per the discharge summary she was seen for generalized weakness and malaise.  CT abdomen showed mild left hydronephrosis secondary to a 3 mm stone at the left UPJ with multiple bilateral intrarenal stones including a nonobstructing 6 mm stone in the left renal pelvis.She was started on empiric ceftriaxone IV and urology was consulted.  Left ureteral stent was placed on 11/22 along with a Foley catheter.  Foley catheter was removed the next day.  No urine cultures were obtained.  The patient was transitioned to cefdinir at time of discharge for total course of 7 days of antibiotics.  Today she states she plans to see urology on Thursday. She continues to note blood in her urine which she thinks it is the same or maybe worse but seems to come and go.  Discharge summary recommendations include getting a BMP/CBC at this follow-up appointment and ensure the patient continues to follow-up with urology.  PERTINENT  PMH / PSH: See above  OBJECTIVE:   Pulse 97   Wt 240 lb 8 oz (109.1 kg)   SpO2 96%   BMI 41.28 kg/m    General: NAD, pleasant, able to participate in exam Cardiac: RRR, no murmurs. Respiratory: Some occasional wheezes on expiration and some fine crackles present bilaterally in the bases Abdomen: Bowel sounds present, nontender, no CVA tenderness Psych: Normal affect and mood  ASSESSMENT/PLAN:   Hospital follow-up after complicated UTI with renal stone and hematuria: Recommend patient continue to follow-up with urology, she has an appointment later this week with them.  We will check a BMP and CBC as recommended in the discharge summary.  She otherwise seems to be improving though she does still have some occasional bouts of hematuria.   Jackelyn Poling, DO [facility]

## 2021-06-06 ENCOUNTER — Ambulatory Visit (INDEPENDENT_AMBULATORY_CARE_PROVIDER_SITE_OTHER): Payer: Self-pay

## 2021-06-06 ENCOUNTER — Ambulatory Visit (INDEPENDENT_AMBULATORY_CARE_PROVIDER_SITE_OTHER): Payer: Self-pay | Admitting: Family Medicine

## 2021-06-06 ENCOUNTER — Other Ambulatory Visit: Payer: Self-pay

## 2021-06-06 ENCOUNTER — Encounter: Payer: Self-pay | Admitting: Family Medicine

## 2021-06-06 VITALS — BP 140/60 | HR 97 | Wt 240.5 lb

## 2021-06-06 DIAGNOSIS — R319 Hematuria, unspecified: Secondary | ICD-10-CM

## 2021-06-06 DIAGNOSIS — Z23 Encounter for immunization: Secondary | ICD-10-CM

## 2021-06-06 DIAGNOSIS — N390 Urinary tract infection, site not specified: Secondary | ICD-10-CM

## 2021-06-06 MED ORDER — IOHEXOL 300 MG/ML  SOLN
INTRAMUSCULAR | Status: DC | PRN
  Administered 2021-05-23: 5 mL via URETHRAL

## 2021-06-06 MED ORDER — SODIUM CHLORIDE 0.9 % IR SOLN
Status: DC | PRN
  Administered 2021-05-23: 3000 mL

## 2021-06-06 MED ORDER — 0.9 % SODIUM CHLORIDE (POUR BTL) OPTIME
TOPICAL | Status: DC | PRN
  Administered 2021-05-23: 1000 mL

## 2021-06-06 NOTE — Patient Instructions (Signed)
We are going to check some labs today and I will follow-up with you when they return.  I recommend that she see urology as scheduled later this week.  I would like to see you back in about a week or 2 to see how we can help you from an anxiety or depression standpoint.  If you develop any concerns in the meantime please let me know.

## 2021-06-07 LAB — BASIC METABOLIC PANEL
BUN/Creatinine Ratio: 12 (ref 9–23)
BUN: 15 mg/dL (ref 6–24)
CO2: 24 mmol/L (ref 20–29)
Calcium: 9.2 mg/dL (ref 8.7–10.2)
Chloride: 106 mmol/L (ref 96–106)
Creatinine, Ser: 1.22 mg/dL — ABNORMAL HIGH (ref 0.57–1.00)
Glucose: 96 mg/dL (ref 70–99)
Potassium: 3.9 mmol/L (ref 3.5–5.2)
Sodium: 144 mmol/L (ref 134–144)
eGFR: 51 mL/min/{1.73_m2} — ABNORMAL LOW (ref >59)

## 2021-06-07 LAB — CBC WITH DIFFERENTIAL/PLATELET
Basophils Absolute: 0.1 10*3/uL (ref 0.0–0.2)
Basos: 1 %
EOS (ABSOLUTE): 0.2 10*3/uL (ref 0.0–0.4)
Eos: 3 %
Hematocrit: 43.7 % (ref 34.0–46.6)
Hemoglobin: 15 g/dL (ref 11.1–15.9)
Immature Grans (Abs): 0 10*3/uL (ref 0.0–0.1)
Immature Granulocytes: 0 %
Lymphocytes Absolute: 1.9 10*3/uL (ref 0.7–3.1)
Lymphs: 31 %
MCH: 30.9 pg (ref 26.6–33.0)
MCHC: 34.3 g/dL (ref 31.5–35.7)
MCV: 90 fL (ref 79–97)
Monocytes Absolute: 0.8 10*3/uL (ref 0.1–0.9)
Monocytes: 12 %
Neutrophils Absolute: 3.4 10*3/uL (ref 1.4–7.0)
Neutrophils: 53 %
Platelets: 258 10*3/uL (ref 150–450)
RBC: 4.86 x10E6/uL (ref 3.77–5.28)
RDW: 12.1 % (ref 11.7–15.4)
WBC: 6.3 10*3/uL (ref 3.4–10.8)

## 2021-06-09 ENCOUNTER — Encounter (HOSPITAL_BASED_OUTPATIENT_CLINIC_OR_DEPARTMENT_OTHER): Payer: Self-pay | Admitting: Urology

## 2021-06-09 ENCOUNTER — Other Ambulatory Visit: Payer: Self-pay | Admitting: Urology

## 2021-06-09 ENCOUNTER — Other Ambulatory Visit: Payer: Self-pay

## 2021-06-09 NOTE — Progress Notes (Signed)
Spoke w/ via phone for pre-op interview--- pt Lab needs dos----  no             Lab results------ pt has current labs results done 05-24-2021 CBCdiff/ BMP in epic;  current ekg in epic / chart COVID test -----patient states asymptomatic no test needed Arrive at ------- 1045 on 06-13-2021 NPO after MN NO Solid Food.  Clear liquids from MN until--- 0945 Med rec completed Medications to take morning of surgery ----- crestor Diabetic medication ----- do half dose lantus insulin morning of surgery  Patient instructed no nail polish to be worn day of surgery Patient instructed to bring photo id and insurance card day of surgery Patient aware to have Driver (ride ) / caregiver for 24 hours after surgery -- daughter, [name] Patient Special Instructions ----- n/a Pre-Op special Istructions ----- n/a Patient verbalized understanding of instructions that were given at this phone interview. Patient denies shortness of breath, chest pain, fever, cough at this phone interview.

## 2021-06-12 NOTE — H&P (Signed)
Urology Pre-op H+P Note   Chief Complaint:  ureteral stone   HPI: [name] is a 60 y.o. female with history of stent placement for a 74mm left proximal ureteral stone with UTI on 11/22. She also has several small stones in her left kidney. Unfortunately no urine culture was sent during her hospitalization. She had negative blood cultures. She had an uncomplicated hospital course and was discharged POD1. She did complete a 7 day course of cefdinir. She has had no recent fevers or chills, still with some left flank pain when she voids. Intermittent hematuria. She has had several stone procedures prior including PCNL bilaterally and several USEs. ]   Past Medical History: Past Medical History:  Diagnosis Date   Arthritis    Bipolar 1 disorder (HCC)    CKD (chronic kidney disease), stage II    COPD (chronic obstructive pulmonary disease) (HCC)    per pt no issues, denies sob, no inhaler   History of CHF (congestive heart failure)    possible CHF 2002   History of kidney stones    History of ovarian cyst 12/2007   Benign ovarian cyst s/p laparotomy for removal left side   History of sepsis    Hyperlipidemia    Hypertension    Left ureteral calculus    MDD (major depressive disorder)    Type 2 diabetes mellitus treated with insulin (HCC)    followed by pcp   (06-09-2021 pt stated check blood sugar TID,  fasting sugar -- 80--110)   Urgency of urination    Wears glasses     Past Surgical History:  Past Surgical History:  Procedure Laterality Date   CESAREAN SECTION  1987   CHOLECYSTECTOMY, LAPAROSCOPIC  11/04/2007   @[facility]    CYSTOSCOPY W/ URETERAL STENT PLACEMENT  06/06/2007   06-06-2007 @[facility] ;  extraction bladder stone;   left 06-10-2013 and right  08-20-2013 both done @[facility]    CYSTOSCOPY WITH RETROGRADE PYELOGRAM, URETEROSCOPY AND STENT PLACEMENT Left 05/23/2021   Procedure: CYSTOSCOPY WITH RETROGRADE PYELOGRAM, URETEROSCOPY AND STENT PLACEMENT;  Surgeon: 08-22-2013, MD;   Location: [facility];  Service: Urology;  Laterality: Left;   CYSTOSCOPY/URETEROSCOPY/HOLMIUM LASER/STENT PLACEMENT     bilateral 12-11-2012;  left 06-10-2013;  right 08-31-2013;  right 04-12-2014;  all done @[facility]    INCISION AND DRAINAGE ABSCESS  02/07/2008   postoperative wound infection , post laparotomy   PERCUTANEOUS NEPHROLITHOTRIPSY Right 08/01/2007   @[facility]    PERCUTANEOUS NEPHROSTOLITHOTOMY Right 11/27/2012   @[facility]    SALPINGOOPHORECTOMY Left 01/19/2008   via Exploratory Laparotomy  @[facility] ,  benign cyst   VAGINAL HYSTERECTOMY     age 25s    Medication: No current facility-administered medications for this encounter.   Current Outpatient Medications  Medication Sig Dispense Refill   acetaminophen (TYLENOL) 500 MG tablet Take 1,000 mg by mouth every 6 (six) hours as needed for mild pain.     aspirin 81 MG EC tablet Take 81 mg by mouth every morning.     Insulin Degludec (TRESIBA FLEXTOUCH [place]) Inject 3 Units into the skin once a week. Wednesdays     insulin glargine (LANTUS) 100 UNIT/ML Solostar Pen Inject 25 Units into the skin every morning. (Patient taking differently: Inject 25 Units into the skin every morning.) 15 mL 3   losartan (COZAAR) 50 MG tablet TAKE ONE TABLET BY MOUTH DAILY (Patient taking differently: Take 50 mg by mouth daily.) 30 tablet 6   Multiple Vitamin (MULTIVITAMIN WITH MINERALS) TABS Take 1 tablet by mouth every  morning.     rosuvastatin (CRESTOR) 10 MG tablet TAKE ONE TABLET BY MOUTH DAILY (Patient taking differently: Take 10 mg by mouth daily.) 90 tablet 3   Insulin Pen Needle (NOVOFINE PEN NEEDLE) 32G X 6 MM MISC 1 each by Does not apply route daily. 100 each 3   nitroGLYCERIN (NITROSTAT) 0.4 MG SL tablet Place 1 tablet (0.4 mg total) under the tongue every 5 (five) minutes as needed for chest pain. If after 1 tablet and 5 minutes the chest pain is still present, go to emergency room. 25 tablet 12   Facility-Administered Medications Ordered in Other Encounters   Medication Dose Route Frequency Provider Last Rate Last Admin   0.9 % irrigation (POUR BTL)    PRN Jacalyn Lefevre D, MD   1,000 mL at 05/23/21 0050   iohexol (OMNIPAQUE) 300 MG/ML solution    PRN Jacalyn Lefevre D, MD   5 mL at 05/23/21 0050   sodium chloride irrigation 0.9 %    PRN Jacalyn Lefevre D, MD   3,000 mL at 05/23/21 0050    Allergies: Allergies  Allergen Reactions   Tamsulosin Shortness Of Breath    Social History: Social History   Tobacco Use   Smoking status: Every Day    Packs/day: 1.00    Years: 44.00    Pack years: 44.00    Types: Cigarettes   Smokeless tobacco: Never  Vaping Use   Vaping Use: Never used  Substance Use Topics   Alcohol use: Not Currently   Drug use: No    Comment: 06-09-2021 pt denies;  pt had positive UDS 05-22-2021 for cannibus and benzodiazepines    Family History History reviewed. No pertinent family history.  Review of Systems 10 systems were reviewed and are negative except as noted specifically in the HPI.  Objective   Vital signs in last 24 hours: Ht 5\' 4"  (1.626 m)   Wt 108.9 kg   BMI 41.20 kg/m   Physical Exam General: NAD, A&O, resting, appropriate HEENT: [place]/AT, EOMI, MMM Pulmonary: Normal work of breathing Cardiovascular: HDS, adequate peripheral perfusion Abdomen: Soft, NTTP, nondistended GU: Voiding spontaneously. CVA tenderness Extremities: warm and well perfused Neuro: Appropriate, no focal neurological deficits  Most Recent Labs: Lab Results  Component Value Date   WBC 6.3 06/06/2021   HGB 15.0 06/06/2021   HCT 43.7 06/06/2021   PLT 258 06/06/2021    Lab Results  Component Value Date   NA 144 06/06/2021   K 3.9 06/06/2021   CL 106 06/06/2021   CO2 24 06/06/2021   BUN 15 06/06/2021   CREATININE 1.22 (H) 06/06/2021   CALCIUM 9.2 06/06/2021   MG 2.0 05/24/2021    Lab Results  Component Value Date   INR 1.1 12/12/2019   APTT 29 12/12/2019     ------  Assessment:  60 y.o. female s/p  stent placement for infected obstruction on LEFT due to 42mm ureteral stone, also with nonobstructing renal stones. Here for definitive stone management.    Plan: - Proceed with cystoscopy, left ureteroscopy with laser lithotripsy, left stent placement

## 2021-06-12 NOTE — Anesthesia Preprocedure Evaluation (Addendum)
Anesthesia Evaluation  Patient identified by MRN, date of birth, ID band Patient awake    Reviewed: Allergy & Precautions, NPO status , Patient's Chart, lab work & pertinent test results  Airway Mallampati: IV  TM Distance: >3 FB Neck ROM: Full    Dental  (+) Poor Dentition, Chipped, Missing, Loose, Dental Advisory Given,  Extremely poor dentition throughout, multiple loose teeth:   Pulmonary shortness of breath (occasional SOB w/ walking) and with exertion, COPD (no inhalers, had them in the past and didnt use them often at all), Current Smoker,  Current smoker, 44 pack year history, still smoking about 1/3ppd    + decreased breath sounds      Cardiovascular hypertension, Pt. on medications +CHF (grade 1 diastolic dysfunction)  Normal cardiovascular exam Rhythm:Regular Rate:Normal  Echo 08/2020: 1. Left ventricular ejection fraction, by estimation, is 60 to 65%. The  left ventricle has normal function. The left ventricle has no regional  wall motion abnormalities. Left ventricular diastolic parameters are  consistent with Grade I diastolic  dysfunction (impaired relaxation).  2. Right ventricular systolic function is normal. The right ventricular  size is normal.  3. The mitral valve is normal in structure. Trivial mitral valve  regurgitation. No evidence of mitral stenosis.  4. The aortic valve was not well visualized. Aortic valve regurgitation  is not visualized. No aortic stenosis is present.  5. The inferior vena cava is normal in size with greater than 50%  respiratory variability, suggesting right atrial pressure of 3 mmHg.    Neuro/Psych PSYCHIATRIC DISORDERS Depression Bipolar Disorder negative neurological ROS     GI/Hepatic negative GI ROS, Neg liver ROS,   Endo/Other  diabetes, Poorly Controlled, Type 2, Insulin DependentMorbid obesityBMI 41 a1c 9.2 FS 99 in preop  Renal/GU CRFRenal disease (L ureteral  stone)Cr 1.22  negative genitourinary   Musculoskeletal  (+) Arthritis , Osteoarthritis,    Abdominal (+) + obese,   Peds  Hematology negative hematology ROS (+)   Anesthesia Other Findings   Reproductive/Obstetrics negative OB ROS                            Anesthesia Physical Anesthesia Plan  ASA: 3  Anesthesia Plan: General   Post-op Pain Management: Tylenol PO (pre-op)   Induction: Intravenous  PONV Risk Score and Plan: 2 and Ondansetron, Dexamethasone, Midazolam and Treatment may vary due to age or medical condition  Airway Management Planned: LMA  Additional Equipment: None  Intra-op Plan:   Post-operative Plan: Extubation in OR  Informed Consent: I have reviewed the patients History and Physical, chart, labs and discussed the procedure including the risks, benefits and alternatives for the proposed anesthesia with the patient or authorized representative who has indicated his/her understanding and acceptance.     Dental advisory given  Plan Discussed with: CRNA  Anesthesia Plan Comments:        Anesthesia Quick Evaluation

## 2021-06-13 ENCOUNTER — Ambulatory Visit (HOSPITAL_COMMUNITY): Payer: Self-pay

## 2021-06-13 ENCOUNTER — Ambulatory Visit (HOSPITAL_BASED_OUTPATIENT_CLINIC_OR_DEPARTMENT_OTHER): Payer: Self-pay | Admitting: Anesthesiology

## 2021-06-13 ENCOUNTER — Ambulatory Visit (HOSPITAL_BASED_OUTPATIENT_CLINIC_OR_DEPARTMENT_OTHER)
Admission: RE | Admit: 2021-06-13 | Discharge: 2021-06-13 | Disposition: A | Discharge status: Home / Self Care | Payer: Self-pay | Source: Ambulatory Visit | Attending: Urology | Admitting: Urology

## 2021-06-13 ENCOUNTER — Encounter (HOSPITAL_BASED_OUTPATIENT_CLINIC_OR_DEPARTMENT_OTHER)
Admission: RE | Disposition: A | Discharge status: Home / Self Care | Payer: Self-pay | Source: Ambulatory Visit | Attending: Urology

## 2021-06-13 ENCOUNTER — Other Ambulatory Visit: Payer: Self-pay

## 2021-06-13 ENCOUNTER — Encounter (HOSPITAL_BASED_OUTPATIENT_CLINIC_OR_DEPARTMENT_OTHER): Payer: Self-pay | Admitting: Urology

## 2021-06-13 DIAGNOSIS — N202 Calculus of kidney with calculus of ureter: Secondary | ICD-10-CM | POA: Insufficient documentation

## 2021-06-13 DIAGNOSIS — R0602 Shortness of breath: Secondary | ICD-10-CM | POA: Insufficient documentation

## 2021-06-13 DIAGNOSIS — F319 Bipolar disorder, unspecified: Secondary | ICD-10-CM | POA: Insufficient documentation

## 2021-06-13 DIAGNOSIS — J449 Chronic obstructive pulmonary disease, unspecified: Secondary | ICD-10-CM | POA: Insufficient documentation

## 2021-06-13 DIAGNOSIS — M1990 Unspecified osteoarthritis, unspecified site: Secondary | ICD-10-CM | POA: Insufficient documentation

## 2021-06-13 DIAGNOSIS — N182 Chronic kidney disease, stage 2 (mild): Secondary | ICD-10-CM | POA: Insufficient documentation

## 2021-06-13 DIAGNOSIS — Z6841 Body Mass Index (BMI) 40.0 and over, adult: Secondary | ICD-10-CM | POA: Insufficient documentation

## 2021-06-13 DIAGNOSIS — E1122 Type 2 diabetes mellitus with diabetic chronic kidney disease: Secondary | ICD-10-CM | POA: Insufficient documentation

## 2021-06-13 DIAGNOSIS — I130 Hypertensive heart and chronic kidney disease with heart failure and stage 1 through stage 4 chronic kidney disease, or unspecified chronic kidney disease: Secondary | ICD-10-CM | POA: Insufficient documentation

## 2021-06-13 DIAGNOSIS — N3080 Other cystitis without hematuria: Secondary | ICD-10-CM | POA: Insufficient documentation

## 2021-06-13 DIAGNOSIS — F17210 Nicotine dependence, cigarettes, uncomplicated: Secondary | ICD-10-CM | POA: Insufficient documentation

## 2021-06-13 DIAGNOSIS — E1165 Type 2 diabetes mellitus with hyperglycemia: Secondary | ICD-10-CM | POA: Insufficient documentation

## 2021-06-13 DIAGNOSIS — I5030 Unspecified diastolic (congestive) heart failure: Secondary | ICD-10-CM | POA: Insufficient documentation

## 2021-06-13 DIAGNOSIS — Z794 Long term (current) use of insulin: Secondary | ICD-10-CM | POA: Insufficient documentation

## 2021-06-13 HISTORY — DX: Essential (primary) hypertension: I10

## 2021-06-13 HISTORY — DX: Chronic obstructive pulmonary disease, unspecified: J44.9

## 2021-06-13 HISTORY — DX: Personal history of other infectious and parasitic diseases: Z86.19

## 2021-06-13 HISTORY — DX: Unspecified osteoarthritis, unspecified site: M19.90

## 2021-06-13 HISTORY — DX: Calculus of ureter: N20.1

## 2021-06-13 HISTORY — DX: Personal history of other diseases of the circulatory system: Z86.79

## 2021-06-13 HISTORY — DX: Urgency of urination: R39.15

## 2021-06-13 HISTORY — DX: Type 2 diabetes mellitus without complications: E11.9

## 2021-06-13 HISTORY — PX: CYSTOSCOPY/URETEROSCOPY/HOLMIUM LASER/STENT PLACEMENT: SHX6546

## 2021-06-13 HISTORY — DX: Chronic kidney disease, stage 2 (mild): N18.2

## 2021-06-13 HISTORY — DX: Long term (current) use of insulin: Z79.4

## 2021-06-13 HISTORY — DX: Personal history of urinary calculi: Z87.442

## 2021-06-13 HISTORY — DX: Presence of spectacles and contact lenses: Z97.3

## 2021-06-13 HISTORY — DX: Bipolar disorder, unspecified: F31.9

## 2021-06-13 HISTORY — DX: Major depressive disorder, single episode, unspecified: F32.9

## 2021-06-13 LAB — GLUCOSE, CAPILLARY
Glucose-Capillary: 99 mg/dL (ref 70–99)
Glucose-Capillary: 100 mg/dL — ABNORMAL HIGH (ref 70–99)

## 2021-06-13 SURGERY — CYSTOSCOPY/URETEROSCOPY/HOLMIUM LASER/STENT PLACEMENT
Anesthesia: General | Laterality: Left

## 2021-06-13 MED ORDER — PROPOFOL 10 MG/ML IV BOLUS
INTRAVENOUS | Status: DC | PRN
  Administered 2021-06-13: 150 mg via INTRAVENOUS

## 2021-06-13 MED ORDER — ONDANSETRON HCL 4 MG/2ML IJ SOLN
INTRAMUSCULAR | Status: DC | PRN
  Administered 2021-06-13: 4 mg via INTRAVENOUS

## 2021-06-13 MED ORDER — CEFAZOLIN SODIUM-DEXTROSE 2-4 GM/100ML-% IV SOLN
2.0000 g | INTRAVENOUS | Status: AC
  Administered 2021-06-13: 2 g via INTRAVENOUS

## 2021-06-13 MED ORDER — DEXAMETHASONE SODIUM PHOSPHATE 4 MG/ML IJ SOLN
INTRAMUSCULAR | Status: DC | PRN
  Administered 2021-06-13: 4 mg via INTRAVENOUS

## 2021-06-13 MED ORDER — LACTATED RINGERS IV SOLN
INTRAVENOUS | Status: DC
  Administered 2021-06-13: 1000 mL via INTRAVENOUS

## 2021-06-13 MED ORDER — PROPOFOL 10 MG/ML IV BOLUS
INTRAVENOUS | Status: AC
  Filled 2021-06-13: qty 20

## 2021-06-13 MED ORDER — LIDOCAINE HCL (CARDIAC) PF 100 MG/5ML IV SOSY
PREFILLED_SYRINGE | INTRAVENOUS | Status: DC | PRN
  Administered 2021-06-13: 60 mg via INTRAVENOUS

## 2021-06-13 MED ORDER — 0.9 % SODIUM CHLORIDE (POUR BTL) OPTIME
TOPICAL | Status: DC | PRN
  Administered 2021-06-13: 500 mL

## 2021-06-13 MED ORDER — AMISULPRIDE (ANTIEMETIC) 5 MG/2ML IV SOLN: 10.0000 mg | Freq: Once | INTRAVENOUS | Status: DC | PRN

## 2021-06-13 MED ORDER — HYDROMORPHONE HCL 1 MG/ML IJ SOLN
0.2500 mg | INTRAMUSCULAR | Status: DC | PRN
Start: 2021-06-13 — End: 2021-06-13

## 2021-06-13 MED ORDER — CEFAZOLIN SODIUM-DEXTROSE 2-4 GM/100ML-% IV SOLN
INTRAVENOUS | Status: AC
  Filled 2021-06-13: qty 100

## 2021-06-13 MED ORDER — ACETAMINOPHEN 500 MG PO TABS
ORAL_TABLET | ORAL | Status: AC
  Filled 2021-06-13: qty 2

## 2021-06-13 MED ORDER — LIDOCAINE 2% (20 MG/ML) 5 ML SYRINGE
INTRAMUSCULAR | Status: AC
  Filled 2021-06-13: qty 5

## 2021-06-13 MED ORDER — IOHEXOL 300 MG/ML  SOLN
INTRAMUSCULAR | Status: DC | PRN
  Administered 2021-06-13: 32 mL

## 2021-06-13 MED ORDER — SODIUM CHLORIDE 0.9 % IR SOLN
Status: DC | PRN
  Administered 2021-06-13: 3000 mL

## 2021-06-13 MED ORDER — PHENYLEPHRINE HCL (PRESSORS) 10 MG/ML IV SOLN
INTRAVENOUS | Status: DC | PRN
  Administered 2021-06-13: 80 ug via INTRAVENOUS

## 2021-06-13 MED ORDER — OXYBUTYNIN CHLORIDE 5 MG PO TABS: 5.0000 mg | ORAL_TABLET | Freq: Three times a day (TID) | ORAL | 1 refills | Status: DC | PRN

## 2021-06-13 MED ORDER — MIDAZOLAM HCL 2 MG/2ML IJ SOLN
INTRAMUSCULAR | Status: AC
  Filled 2021-06-13: qty 2

## 2021-06-13 MED ORDER — ONDANSETRON HCL 4 MG/2ML IJ SOLN
INTRAMUSCULAR | Status: AC
  Filled 2021-06-13: qty 2

## 2021-06-13 MED ORDER — FENTANYL CITRATE (PF) 100 MCG/2ML IJ SOLN
INTRAMUSCULAR | Status: AC
  Filled 2021-06-13: qty 2

## 2021-06-13 MED ORDER — DEXAMETHASONE SODIUM PHOSPHATE 10 MG/ML IJ SOLN
INTRAMUSCULAR | Status: AC
  Filled 2021-06-13: qty 1

## 2021-06-13 MED ORDER — FENTANYL CITRATE (PF) 100 MCG/2ML IJ SOLN
INTRAMUSCULAR | Status: DC | PRN
  Administered 2021-06-13 (×2): 50 ug via INTRAVENOUS

## 2021-06-13 MED ORDER — ACETAMINOPHEN-CODEINE #3 300-30 MG PO TABS: 1.0000 | ORAL_TABLET | Freq: Four times a day (QID) | ORAL | 0 refills | Status: DC | PRN

## 2021-06-13 MED ORDER — CEPHALEXIN 500 MG PO CAPS: 500.0000 mg | ORAL_CAPSULE | Freq: Two times a day (BID) | ORAL | 0 refills | Status: AC

## 2021-06-13 MED ORDER — ONDANSETRON HCL 4 MG/2ML IJ SOLN: 4.0000 mg | Freq: Once | INTRAMUSCULAR | Status: DC | PRN

## 2021-06-13 MED ORDER — OXYCODONE HCL 5 MG PO TABS
ORAL_TABLET | ORAL | Status: AC
  Filled 2021-06-13: qty 1

## 2021-06-13 MED ORDER — KETOROLAC TROMETHAMINE 10 MG PO TABS: 10.0000 mg | ORAL_TABLET | Freq: Three times a day (TID) | ORAL | 0 refills | Status: DC | PRN

## 2021-06-13 MED ORDER — PHENAZOPYRIDINE HCL 200 MG PO TABS: 200.0000 mg | ORAL_TABLET | Freq: Three times a day (TID) | ORAL | 0 refills | Status: DC | PRN

## 2021-06-13 MED ORDER — OXYCODONE HCL 5 MG/5ML PO SOLN: 5.0000 mg | Freq: Once | ORAL | Status: AC | PRN

## 2021-06-13 MED ORDER — ACETAMINOPHEN 500 MG PO TABS
1000.0000 mg | ORAL_TABLET | Freq: Once | ORAL | Status: AC
  Administered 2021-06-13: 1000 mg via ORAL

## 2021-06-13 MED ORDER — MIDAZOLAM HCL 5 MG/5ML IJ SOLN
INTRAMUSCULAR | Status: DC | PRN
  Administered 2021-06-13: 2 mg via INTRAVENOUS

## 2021-06-13 MED ORDER — OXYCODONE HCL 5 MG PO TABS
5.0000 mg | ORAL_TABLET | Freq: Once | ORAL | Status: AC | PRN
  Administered 2021-06-13: 5 mg via ORAL

## 2021-06-13 SURGICAL SUPPLY — 31 items
APL SKNCLS STERI-STRIP NONHPOA (GAUZE/BANDAGES/DRESSINGS) ×1
BAG DRAIN URO-CYSTO SKYTR STRL (DRAIN) ×3 IMPLANT
BAG DRN UROCATH (DRAIN) ×1
BASKET ZERO TIP NITINOL 2.4FR (BASKET) ×2 IMPLANT
BENZOIN TINCTURE PRP APPL 2/3 (GAUZE/BANDAGES/DRESSINGS) ×2 IMPLANT
BSKT STON RTRVL ZERO TP 2.4FR (BASKET) ×1
CATH INTERMIT  6FR 70CM (CATHETERS) ×5 IMPLANT
CATH URET DUAL LUMEN 6-10FR 50 (CATHETERS) ×2 IMPLANT
CLOTH BEACON ORANGE TIMEOUT ST (SAFETY) ×3 IMPLANT
COVER DOME SNAP 22 D (MISCELLANEOUS) ×3 IMPLANT
DRSG TEGADERM 2-3/8X2-3/4 SM (GAUZE/BANDAGES/DRESSINGS) ×2 IMPLANT
ELECT REM PT RETURN 9FT ADLT (ELECTROSURGICAL)
ELECTRODE REM PT RTRN 9FT ADLT (ELECTROSURGICAL) IMPLANT
FIBER LASER FLEXIVA 365 (UROLOGICAL SUPPLIES) IMPLANT
GLOVE SURG ENC MOIS LTX SZ7 (GLOVE) ×3 IMPLANT
GLOVE SURG UNDER POLY LF SZ6.5 (GLOVE) ×2 IMPLANT
GOWN STRL REUS W/TWL LRG LVL3 (GOWN DISPOSABLE) ×5 IMPLANT
GUIDEWIRE ANG ZIPWIRE 038X150 (WIRE) ×2 IMPLANT
GUIDEWIRE STR DUAL SENSOR (WIRE) ×2 IMPLANT
IV NS IRRIG 3000ML ARTHROMATIC (IV SOLUTION) ×4 IMPLANT
KIT TURNOVER CYSTO (KITS) ×3 IMPLANT
MANIFOLD NEPTUNE II (INSTRUMENTS) ×3 IMPLANT
NS IRRIG 500ML POUR BTL (IV SOLUTION) ×3 IMPLANT
PACK CYSTO (CUSTOM PROCEDURE TRAY) ×3 IMPLANT
SHEATH URETERAL 12FRX35CM (MISCELLANEOUS) ×2 IMPLANT
STENT URET 6FRX24 CONTOUR (STENTS) ×2 IMPLANT
TRACTIP FLEXIVA PULS ID 200XHI (Laser) IMPLANT
TRACTIP FLEXIVA PULSE ID 200 (Laser) ×3
TUBE CONNECTING 12'X1/4 (SUCTIONS) ×1
TUBE CONNECTING 12X1/4 (SUCTIONS) ×1 IMPLANT
TUBING UROLOGY SET (TUBING) ×2 IMPLANT

## 2021-06-13 NOTE — Transfer of Care (Signed)
Immediate Anesthesia Transfer of Care Note  Patient: [name]  Procedure(s) Performed: CYSTOSCOPY/ RETROGRADE /URETEROSCOPY/ STONE LITHOTRIPSY/ HOLMIUM LASER/STENT  REPLACEMENT (Left)  Patient Location: PACU  Anesthesia Type:General  Level of Consciousness: sedated  Airway & Oxygen Therapy: Patient Spontanous Breathing and Patient connected to nasal cannula oxygen  Post-op Assessment: Report given to RN and Post -op Vital signs reviewed and stable  Post vital signs: Reviewed and stable  Last Vitals:  Vitals Value Taken Time  BP 158/81 06/13/21 1349  Temp    Pulse 88 06/13/21 1355  Resp 22 06/13/21 1356  SpO2 97 % 06/13/21 1355  Vitals shown include unvalidated device data.  Last Pain:  Vitals:   06/13/21 1051  TempSrc: Oral  PainSc: 0-No pain      Patients Stated Pain Goal: 5 (06/13/21 1051)  Complications: No notable events documented.

## 2021-06-13 NOTE — Discharge Instructions (Addendum)
[facility] [phone] Post Ureteroscopy With or Without Stent Instructions  Definitions:  Ureter: The duct that transports urine from the kidney to the bladder. Stent:   A plastic hollow tube that is placed into the ureter, from the kidney to the bladder to prevent the ureter from swelling shut.  **remove stent by pulling on string on Monday 12/19**  GENERAL INSTRUCTIONS:  Despite the fact that no skin incisions were used, the area around the ureter and bladder is raw and irritated. The stent is a foreign body which will further irritate the bladder wall. This irritation is manifested by increased frequency of urination, both day and night, and by an increase in the urge to urinate. In some, the urge to urinate is present almost always. Sometimes the urge is strong enough that you may not be able to stop yourself from urinating. The only real cure is to remove the stent and then give time for the bladder wall to heal which can't be done until the danger of the ureter swelling shut has passed, which varies.  You may see some blood in your urine while the stent is in place and a few days afterwards. Do not be alarmed, even if the urine was clear for a while. Get off your feet and drink lots of fluids until clearing occurs. If you start to pass clots or don't improve, call us.  DIET: You may return to your normal diet immediately. Because of the raw surface of your bladder, alcohol, spicy foods, acid type foods and drinks with caffeine may cause irritation or frequency and should be used in moderation. To keep your urine flowing freely and to avoid constipation, drink plenty of fluids during the day ( 8-10 glasses ). Tip: Avoid cranberry juice because it is very acidic.  ACTIVITY: Your physical activity doesn't need to be restricted. However, if you are very active, you may see some blood in your urine. We suggest that you reduce your activity under these circumstances until  the bleeding has stopped.  BOWELS: It is important to keep your bowels regular during the postoperative period. Straining with bowel movements can cause bleeding. A bowel movement every other day is reasonable. Use a mild laxative if needed, such as Milk of Magnesia 2-3 tablespoons, or 2 Dulcolax tablets. Call if you continue to have problems. If you have been taking narcotics for pain, before, during or after your surgery, you may be constipated. Take a laxative if necessary.   MEDICATION: You should resume your pre-surgery medications unless told not to. In addition you will often be given an antibiotic to prevent infection. These should be taken as prescribed until the bottles are finished unless you are having an unusual reaction to one of the drugs.  PROBLEMS YOU SHOULD REPORT TO [place]: Fevers over 100.5 Fahrenheit. Heavy bleeding, or clots ( See above notes about blood in urine ). Inability to urinate. Drug reactions ( hives, rash, nausea, vomiting, diarrhea ). Severe burning or pain with urination that is not improving.  FOLLOW-UP: F/u as scheduled in 6 weeks      Post Anesthesia Home Care Instructions  Activity: Get plenty of rest for the remainder of the day. A responsible adult should stay with you for 24 hours following the procedure.  For the next 24 hours, DO NOT: -Drive a car -Advertising copywriter -Drink alcoholic beverages -Take any medication unless instructed by your physician -Make any legal decisions or sign important papers.  Meals: Start with liquid foods such  as gelatin or soup. Progress to regular foods as tolerated. Avoid greasy, spicy, heavy foods. If nausea and/or vomiting occur, drink only clear liquids until the nausea and/or vomiting subsides. Call your physician if vomiting continues.  Special Instructions/Symptoms: Your throat may feel dry or sore from the anesthesia or the breathing tube placed in your throat during surgery. If this causes discomfort,  gargle with warm salt water. The discomfort should disappear within 24 hours.  If you had a scopolamine patch placed behind your ear for the management of post- operative nausea and/or vomiting:  1. The medication in the patch is effective for 72 hours, after which it should be removed.  Wrap patch in a tissue and discard in the trash. Wash hands thoroughly with soap and water. 2. You may remove the patch earlier than 72 hours if you experience unpleasant side effects which may include dry mouth, dizziness or visual disturbances. 3. Avoid touching the patch. Wash your hands with soap and water after contact with the patch.

## 2021-06-13 NOTE — Op Note (Signed)
Operative Note  Preoperative diagnosis:  1.  Left ureteral and renal stones  Postoperative diagnosis: 1.  Left renal stone  Procedure(s): 1.  Left ureteroscopy with laser lithotripsy and basket extraction of stones 2. Left retrograde pyelogram 3. Left ureteral stent placement 4. Fluoroscopy with intraoperative interpretation  Surgeon: Irine Seal, MD  Assistants:  None  Anesthesia:  General  Complications:  None  EBL:  Minimal  Specimens: 1. Left urine sample from collecting system for culture  Drains/Catheters: 1.  6Fr x 24cm ureteral stent tether string  Intraoperative findings:   Cystoscopy showed some cystitis cystica and debris in the bladder Ureteroscopy demonstrated 3 stones in the kidney. There was also copious debris in the kidney Successful stent placement.  Indication:  [name] is a 60 y.o. female with left ureteral and renal stones; she previously had a stent placed urgently on 11/22  Description of procedure: After informed consent was obtained from the patient, the patient was identified and taken to the operating room and placed in the supine position.  General anesthesia was administered as well as perioperative IV antibiotics.  At the beginning of the case, a time-out was performed to properly identify the patient, the surgery to be performed, and the surgical site.  Sequential compression devices were applied to the lower extremities at the beginning of the case for DVT prophylaxis.  The patient was then placed in the dorsal lithotomy supine position, prepped and draped in sterile fashion.  We then passed the 21-French rigid cystoscope through the urethra and into the bladder under vision without any difficulty.  A systematic evaluation of the bladder revealed no evidence of any suspicious bladder lesions.  Ureteral orifices were in normal position.    The distal aspect of the ureteral stent was seen protruding from the left ureteral orifice.   We then used the alligator-tooth forceps and grasped the distal end of the ureteral stent and brought it out the urethral meatus while watching the proximal coil straighten out nicely on fluoroscopy. Through the ureteral stent, we then passed a 0.038 sensor wire up to the level of the renal pelvis.  The ureteral stent was then removed, leaving the sensor wire up the left ureter.    Under cystoscopic and flouroscopic guidance, we cannulated the left ureteral orifice with a dual lumen catheter and a gentle retrograde pyelogram was performed, revealing a normal caliber ureter without any filling defects. There was no hydronephrosis of the collecting system. There was no filling defect in the ureter identified. A zip wire was then passed up to the level of the renal pelvis and secured to the drape as a safety wire. The ureteral catheter and cystoscope were removed. A 11/13Fr ureteral access sheath was carefully advanced up the ureter to the level of the UPJ over this wire under fluoroscopic guidance over the sensor wire. The flexible ureteroscope was advanced into the collecting system via the access sheath. The collecting system was inspected. As mentioned above, there was copious debris in the kidneys. We used a 10 cc syringe to irrigate out the collecting system. Multiple stones were identified in the lower pole - 2 large stones and one smaller stone. Using the 272 micron holmium laser fiber, the stones were fragmented completely. With the ureteroscope in the kidney, a gentle pyelogram was performed to delineate the calyceal system and we evaluated the calyces systematically. We encountered a no further stone fragments >2 mm. The rest of the stone fragments were very tiny and these were  irrigated away gently. The calyces were re-inspected and there were no significant stone fragment residual.   We then withdrew the ureteroscope back down the ureter along with the access sheath, noting no evidence of any stones  along the course of the ureter.  Prior to removing the ureteroscope, we did pass the Glidewire back up to the ureter to the renal pelvis.  Once the ureteroscope was removed, we then used the Glidewire under fluoroscopic guidance and passed up a 6-French x 24 cm double-pigtail ureteral stent up the ureter, making sure that the proximal and distal ends coiled within the kidney and bladder respectively.  Note that we left a long tether string attached to the distal end of the ureteral stent and it exited the urethral meatus and was secured to the mons with a tegaderm adhesive.  The cystoscope was then advanced back into the bladder under vision.  We were able to see the distal stent coiling nicely within the bladder.  The bladder was then emptied with irrigation solution.  The cystoscope was then removed.    The patient tolerated the procedure well and there was no complication. Patient was awoken from anesthesia and taken to the recovery room in stable condition. I was present and scrubbed for the entirety of the case.  Plan:  Patient will be discharged home.  [name] may remove the stent Monday 12/19 and f/u in 6 weeks   G. Irine Seal MD [facility]  Pager: [phone]

## 2021-06-13 NOTE — Interval H&P Note (Signed)
History and Physical Interval Note:  06/13/2021 11:13 AM  [name]  has presented today for surgery, with the diagnosis of LEFT URETERAL STONE.  The various methods of treatment have been discussed with the patient and family. After consideration of risks, benefits and other options for treatment, the patient has consented to  Procedure(s): CYSTOSCOPY/URETEROSCOPY/HOLMIUM LASER/STENT PLACEMENT (Left) as a surgical intervention.  The patient's history has been reviewed, patient examined, no change in status, stable for surgery.  I have reviewed the patient's chart and labs.  Questions were answered to the patient's satisfaction.     Judson Roch

## 2021-06-13 NOTE — Anesthesia Procedure Notes (Signed)
Procedure Name: LMA Insertion Date/Time: 06/13/2021 12:06 PM Performed by: Burna Cash, CRNA Pre-anesthesia Checklist: Patient identified, Emergency Drugs available, Suction available and Patient being monitored Patient Re-evaluated:Patient Re-evaluated prior to induction Oxygen Delivery Method: Circle system utilized Preoxygenation: Pre-oxygenation with 100% oxygen Induction Type: IV induction Ventilation: Mask ventilation without difficulty LMA: LMA inserted LMA Size: 4.0 Number of attempts: 1 Airway Equipment and Method: Bite block Placement Confirmation: positive ETCO2 Tube secured with: Tape Dental Injury: Teeth and Oropharynx as per pre-operative assessment

## 2021-06-13 NOTE — Interval H&P Note (Signed)
History and Physical Interval Note:  06/13/2021 11:53 AM  [name]  has presented today for surgery, with the diagnosis of LEFT URETERAL STONE.  The various methods of treatment have been discussed with the patient and family. After consideration of risks, benefits and other options for treatment, the patient has consented to  Procedure(s): CYSTOSCOPY/URETEROSCOPY/HOLMIUM LASER/STENT PLACEMENT (Left) as a surgical intervention.  The patient's history has been reviewed, patient examined, no change in status, stable for surgery.  I have reviewed the patient's chart and labs.  Questions were answered to the patient's satisfaction.     [name] L Azhar Knope

## 2021-06-13 NOTE — Anesthesia Postprocedure Evaluation (Signed)
Anesthesia Post Note  Patient: [name]  Procedure(s) Performed: CYSTOSCOPY/ RETROGRADE /URETEROSCOPY/ STONE LITHOTRIPSY/ HOLMIUM LASER/STENT  REPLACEMENT (Left)     Patient location during evaluation: PACU Anesthesia Type: General Level of consciousness: awake and alert, oriented and patient cooperative Pain management: pain level controlled Vital Signs Assessment: post-procedure vital signs reviewed and stable Respiratory status: spontaneous breathing, nonlabored ventilation and respiratory function stable Cardiovascular status: blood pressure returned to baseline and stable Postop Assessment: no apparent nausea or vomiting Anesthetic complications: no   No notable events documented.  Last Vitals:  Vitals:   06/13/21 1051 06/13/21 1349  BP: (!) 170/79 (!) 158/81  Pulse: (!) 101 88  Resp: 18 19  Temp: (!) 36.3 C (!) 36.4 C  SpO2: 96% 96%    Last Pain:  Vitals:   06/13/21 1349  TempSrc:   PainSc: Asleep                 Lannie Fields

## 2021-06-14 ENCOUNTER — Encounter (HOSPITAL_BASED_OUTPATIENT_CLINIC_OR_DEPARTMENT_OTHER): Payer: Self-pay | Admitting: Urology

## 2021-06-15 LAB — URINE CULTURE: Culture: >=100000 — AB

## 2021-08-08 ENCOUNTER — Telehealth: Payer: Self-pay

## 2021-08-08 NOTE — Telephone Encounter (Signed)
Pt's daugher returned phone call. Will get in touch with her mother about her medications & call me back. Also suggested she tells her mom to make an appt with Dr. Valentina Lucks regarding her diabetes regimen. In case she needs a dosage change on the ozempic & tresiba.

## 2021-08-08 NOTE — Telephone Encounter (Signed)
Left voicemail with daughter requesting call back [phone].  Pt has meds to pickup. If no longer needed, will use as samples.

## 2021-12-05 ENCOUNTER — Encounter: Payer: Self-pay | Admitting: *Deleted

## 2022-11-29 ENCOUNTER — Observation Stay (HOSPITAL_COMMUNITY)
Admission: EM | Admit: 2022-11-29 | Discharge: 2022-12-01 | Disposition: A | Discharge status: Home / Self Care | Payer: Commercial Managed Care - HMO | Attending: Family Medicine | Admitting: Family Medicine

## 2022-11-29 ENCOUNTER — Emergency Department (HOSPITAL_COMMUNITY): Payer: Commercial Managed Care - HMO

## 2022-11-29 ENCOUNTER — Encounter (HOSPITAL_COMMUNITY): Payer: Self-pay

## 2022-11-29 ENCOUNTER — Other Ambulatory Visit: Payer: Self-pay

## 2022-11-29 DIAGNOSIS — F17210 Nicotine dependence, cigarettes, uncomplicated: Secondary | ICD-10-CM | POA: Insufficient documentation

## 2022-11-29 DIAGNOSIS — R079 Chest pain, unspecified: Secondary | ICD-10-CM

## 2022-11-29 DIAGNOSIS — B349 Viral infection, unspecified: Secondary | ICD-10-CM | POA: Diagnosis not present

## 2022-11-29 DIAGNOSIS — Z1152 Encounter for screening for COVID-19: Secondary | ICD-10-CM | POA: Diagnosis not present

## 2022-11-29 DIAGNOSIS — E0865 Diabetes mellitus due to underlying condition with hyperglycemia: Secondary | ICD-10-CM

## 2022-11-29 DIAGNOSIS — R072 Precordial pain: Secondary | ICD-10-CM | POA: Diagnosis not present

## 2022-11-29 DIAGNOSIS — I130 Hypertensive heart and chronic kidney disease with heart failure and stage 1 through stage 4 chronic kidney disease, or unspecified chronic kidney disease: Secondary | ICD-10-CM | POA: Diagnosis not present

## 2022-11-29 DIAGNOSIS — Z7982 Long term (current) use of aspirin: Secondary | ICD-10-CM | POA: Insufficient documentation

## 2022-11-29 DIAGNOSIS — Z794 Long term (current) use of insulin: Secondary | ICD-10-CM | POA: Insufficient documentation

## 2022-11-29 DIAGNOSIS — N1831 Chronic kidney disease, stage 3a: Secondary | ICD-10-CM | POA: Insufficient documentation

## 2022-11-29 DIAGNOSIS — E1122 Type 2 diabetes mellitus with diabetic chronic kidney disease: Secondary | ICD-10-CM | POA: Insufficient documentation

## 2022-11-29 DIAGNOSIS — J449 Chronic obstructive pulmonary disease, unspecified: Secondary | ICD-10-CM | POA: Insufficient documentation

## 2022-11-29 DIAGNOSIS — I509 Heart failure, unspecified: Secondary | ICD-10-CM | POA: Diagnosis not present

## 2022-11-29 DIAGNOSIS — I10 Essential (primary) hypertension: Secondary | ICD-10-CM | POA: Diagnosis present

## 2022-11-29 DIAGNOSIS — R0789 Other chest pain: Secondary | ICD-10-CM | POA: Diagnosis present

## 2022-11-29 DIAGNOSIS — R809 Proteinuria, unspecified: Secondary | ICD-10-CM

## 2022-11-29 LAB — BASIC METABOLIC PANEL
Anion gap: 14 (ref 5–15)
BUN: 15 mg/dL (ref 6–20)
CO2: 23 mmol/L (ref 22–32)
Calcium: 9.7 mg/dL (ref 8.9–10.3)
Chloride: 101 mmol/L (ref 98–111)
Creatinine, Ser: 1.23 mg/dL — ABNORMAL HIGH (ref 0.44–1.00)
GFR, Estimated: 50 mL/min — ABNORMAL LOW (ref >60)
Glucose, Bld: 223 mg/dL — ABNORMAL HIGH (ref 70–99)
Potassium: 3.4 mmol/L — ABNORMAL LOW (ref 3.5–5.1)
Sodium: 138 mmol/L (ref 135–145)

## 2022-11-29 LAB — CBC
HCT: 46.1 % — ABNORMAL HIGH (ref 36.0–46.0)
Hemoglobin: 15.6 g/dL — ABNORMAL HIGH (ref 12.0–15.0)
MCH: 30.4 pg (ref 26.0–34.0)
MCHC: 33.8 g/dL (ref 30.0–36.0)
MCV: 89.9 fL (ref 80.0–100.0)
Platelets: 320 10*3/uL (ref 150–400)
RBC: 5.13 MIL/uL — ABNORMAL HIGH (ref 3.87–5.11)
RDW: 13 % (ref 11.5–15.5)
WBC: 13.1 10*3/uL — ABNORMAL HIGH (ref 4.0–10.5)
nRBC: 0 % (ref 0.0–0.2)

## 2022-11-29 LAB — TROPONIN I (HIGH SENSITIVITY): Troponin I (High Sensitivity): 24 ng/L — ABNORMAL HIGH (ref <18)

## 2022-11-29 MED ORDER — KETOROLAC TROMETHAMINE 30 MG/ML IJ SOLN
30.0000 mg | Freq: Once | INTRAMUSCULAR | Status: AC
  Administered 2022-11-29: 30 mg via INTRAVENOUS
  Filled 2022-11-29: qty 1

## 2022-11-29 MED ORDER — ONDANSETRON HCL 4 MG/2ML IJ SOLN
4.0000 mg | Freq: Once | INTRAMUSCULAR | Status: AC
  Administered 2022-11-29: 4 mg via INTRAVENOUS
  Filled 2022-11-29: qty 2

## 2022-11-29 NOTE — ED Triage Notes (Signed)
Monday pt began having left sided chest pressure that radiates to right arm. Also endorses SOB. Nothing makes the pain better or worse. Chest pain started while pt was watching tv. Pt does have history of CHF

## 2022-11-30 ENCOUNTER — Emergency Department (HOSPITAL_COMMUNITY): Payer: Commercial Managed Care - HMO

## 2022-11-30 ENCOUNTER — Inpatient Hospital Stay (HOSPITAL_BASED_OUTPATIENT_CLINIC_OR_DEPARTMENT_OTHER): Payer: Commercial Managed Care - HMO

## 2022-11-30 ENCOUNTER — Other Ambulatory Visit (HOSPITAL_COMMUNITY): Payer: Self-pay

## 2022-11-30 DIAGNOSIS — R7989 Other specified abnormal findings of blood chemistry: Secondary | ICD-10-CM | POA: Diagnosis not present

## 2022-11-30 DIAGNOSIS — R0789 Other chest pain: Secondary | ICD-10-CM | POA: Diagnosis present

## 2022-11-30 DIAGNOSIS — R079 Chest pain, unspecified: Secondary | ICD-10-CM | POA: Diagnosis not present

## 2022-11-30 DIAGNOSIS — R0989 Other specified symptoms and signs involving the circulatory and respiratory systems: Secondary | ICD-10-CM

## 2022-11-30 DIAGNOSIS — E119 Type 2 diabetes mellitus without complications: Secondary | ICD-10-CM

## 2022-11-30 DIAGNOSIS — Z794 Long term (current) use of insulin: Secondary | ICD-10-CM

## 2022-11-30 DIAGNOSIS — E785 Hyperlipidemia, unspecified: Secondary | ICD-10-CM

## 2022-11-30 LAB — URINALYSIS, ROUTINE W REFLEX MICROSCOPIC
Bilirubin Urine: NEGATIVE
Glucose, UA: NEGATIVE mg/dL
Ketones, ur: NEGATIVE mg/dL
Nitrite: NEGATIVE
Protein, ur: 30 mg/dL — AB
Specific Gravity, Urine: >1.046 — ABNORMAL HIGH (ref 1.005–1.030)
WBC, UA: >50 WBC/hpf (ref 0–5)
pH: 5 (ref 5.0–8.0)

## 2022-11-30 LAB — ECHOCARDIOGRAM COMPLETE
AR max vel: 2.25 cm2
AV Area VTI: 2.37 cm2
AV Area mean vel: 2.19 cm2
AV Mean grad: 5 mmHg
AV Peak grad: 7.4 mmHg
Ao pk vel: 1.36 m/s
Area-P 1/2: 2.62 cm2
Height: 64 in
S' Lateral: 2.2 cm
Single Plane A4C EF: 73.7 %
Weight: 4045.88 oz

## 2022-11-30 LAB — BASIC METABOLIC PANEL
Anion gap: 10 (ref 5–15)
BUN: 23 mg/dL — ABNORMAL HIGH (ref 6–20)
CO2: 20 mmol/L — ABNORMAL LOW (ref 22–32)
Calcium: 8.8 mg/dL — ABNORMAL LOW (ref 8.9–10.3)
Chloride: 103 mmol/L (ref 98–111)
Creatinine, Ser: 1.22 mg/dL — ABNORMAL HIGH (ref 0.44–1.00)
GFR, Estimated: 51 mL/min — ABNORMAL LOW (ref >60)
Glucose, Bld: 139 mg/dL — ABNORMAL HIGH (ref 70–99)
Potassium: 3.7 mmol/L (ref 3.5–5.1)
Sodium: 133 mmol/L — ABNORMAL LOW (ref 135–145)

## 2022-11-30 LAB — GLUCOSE, CAPILLARY
Glucose-Capillary: 143 mg/dL — ABNORMAL HIGH (ref 70–99)
Glucose-Capillary: 163 mg/dL — ABNORMAL HIGH (ref 70–99)
Glucose-Capillary: 125 mg/dL — ABNORMAL HIGH (ref 70–99)
Glucose-Capillary: 135 mg/dL — ABNORMAL HIGH (ref 70–99)

## 2022-11-30 LAB — CBC
HCT: 42.7 % (ref 36.0–46.0)
Hemoglobin: 14.2 g/dL (ref 12.0–15.0)
MCH: 30.3 pg (ref 26.0–34.0)
MCHC: 33.3 g/dL (ref 30.0–36.0)
MCV: 91 fL (ref 80.0–100.0)
Platelets: 281 10*3/uL (ref 150–400)
RBC: 4.69 MIL/uL (ref 3.87–5.11)
RDW: 13.2 % (ref 11.5–15.5)
WBC: 9.5 10*3/uL (ref 4.0–10.5)
nRBC: 0 % (ref 0.0–0.2)

## 2022-11-30 LAB — LIPID PANEL
Cholesterol: 200 mg/dL (ref 0–200)
HDL: 33 mg/dL — ABNORMAL LOW (ref >40)
LDL Cholesterol: 106 mg/dL — ABNORMAL HIGH (ref 0–99)
Total CHOL/HDL Ratio: 6.1 RATIO
Triglycerides: 304 mg/dL — ABNORMAL HIGH (ref <150)
VLDL: 61 mg/dL — ABNORMAL HIGH (ref 0–40)

## 2022-11-30 LAB — MAGNESIUM: Magnesium: 2 mg/dL (ref 1.7–2.4)

## 2022-11-30 LAB — TROPONIN I (HIGH SENSITIVITY): Troponin I (High Sensitivity): 32 ng/L — ABNORMAL HIGH (ref <18)

## 2022-11-30 LAB — RESP PANEL BY RT-PCR (RSV, FLU A&B, COVID)  RVPGX2
Influenza A by PCR: NEGATIVE
Influenza B by PCR: NEGATIVE
Resp Syncytial Virus by PCR: NEGATIVE
SARS Coronavirus 2 by RT PCR: NEGATIVE

## 2022-11-30 MED ORDER — ACETAMINOPHEN 650 MG RE SUPP: 650.0000 mg | Freq: Four times a day (QID) | RECTAL | Status: DC | PRN

## 2022-11-30 MED ORDER — NITROGLYCERIN 2 % TD OINT
0.5000 [in_us] | TOPICAL_OINTMENT | Freq: Once | TRANSDERMAL | Status: AC
  Administered 2022-11-30: 0.5 [in_us] via TOPICAL
  Filled 2022-11-30: qty 1

## 2022-11-30 MED ORDER — POTASSIUM CHLORIDE CRYS ER 20 MEQ PO TBCR
40.0000 meq | EXTENDED_RELEASE_TABLET | Freq: Once | ORAL | Status: AC
  Administered 2022-11-30: 40 meq via ORAL
  Filled 2022-11-30: qty 2

## 2022-11-30 MED ORDER — SODIUM CHLORIDE 0.9 % IV SOLN: INTRAVENOUS | Status: DC

## 2022-11-30 MED ORDER — INSULIN ASPART 100 UNIT/ML IJ SOLN
0.0000 [IU] | Freq: Three times a day (TID) | INTRAMUSCULAR | Status: DC
  Administered 2022-11-30: 1 [IU] via SUBCUTANEOUS
  Administered 2022-11-30: 2 [IU] via SUBCUTANEOUS
  Administered 2022-12-01: 1 [IU] via SUBCUTANEOUS

## 2022-11-30 MED ORDER — ACETAMINOPHEN 325 MG PO TABS
650.0000 mg | ORAL_TABLET | Freq: Four times a day (QID) | ORAL | Status: DC | PRN
  Administered 2022-11-30: 650 mg via ORAL
  Filled 2022-11-30: qty 2

## 2022-11-30 MED ORDER — NICOTINE 14 MG/24HR TD PT24
14.0000 mg | MEDICATED_PATCH | Freq: Every day | TRANSDERMAL | Status: DC
  Administered 2022-11-30 – 2022-12-01 (×2): 14 mg via TRANSDERMAL
  Filled 2022-11-30 (×2): qty 1

## 2022-11-30 MED ORDER — SODIUM CHLORIDE 0.9 % IV SOLN
1.0000 g | INTRAVENOUS | Status: DC
  Administered 2022-11-30: 1 g via INTRAVENOUS
  Filled 2022-11-30: qty 10

## 2022-11-30 MED ORDER — IOHEXOL 350 MG/ML SOLN
75.0000 mL | Freq: Once | INTRAVENOUS | Status: AC | PRN
  Administered 2022-11-30: 75 mL via INTRAVENOUS

## 2022-11-30 MED ORDER — ACETAMINOPHEN 500 MG PO TABS
1000.0000 mg | ORAL_TABLET | Freq: Once | ORAL | Status: AC
  Administered 2022-11-30: 1000 mg via ORAL
  Filled 2022-11-30: qty 2

## 2022-11-30 MED ORDER — INSULIN ASPART 100 UNIT/ML IJ SOLN: 0.0000 [IU] | Freq: Every day | INTRAMUSCULAR | Status: DC

## 2022-11-30 NOTE — TOC Benefit Eligibility Note (Addendum)
Patient Product/process development scientist completed.    The patient is insured through [employer] and has a $5500 deductible  This test claim was processed through [facility]- copay amounts may vary at other pharmacies due to pharmacy/plan contracts, or as the patient moves through the different stages of their insurance plan.  Roland Earl, CPHT Pharmacy Patient Advocate Specialist [facility] Patient Advocate Team Direct Number: [phone]  Fax: [phone]

## 2022-11-30 NOTE — ED Provider Notes (Addendum)
[place] EMERGENCY DEPARTMENT AT [facility] Provider Note   CSN: [id number] Arrival date & time: 11/29/22  2056     History  Chief Complaint  Patient presents with   Chest Pain    KARRON KOVACEVIC is a 62 y.o. female.  The history is provided by the patient.  Chest Pain Pain location:  L chest Pain quality: dull   Pain radiates to:  R arm Pain severity:  Moderate Onset quality:  Gradual Duration:  1 week Timing:  Intermittent Progression:  Unchanged Context: at rest   Relieved by:  Nothing Worsened by:  Nothing Ineffective treatments:  None tried Associated symptoms: no cough, no fever, no lower extremity edema, no vomiting and no weakness   Associated symptoms comment:  Also has headache and diarrhea  Risk factors: no aortic disease   Patient with h/o CHF and hypertension presents with L sided CP and headache and diarrhea.  No exertional symptoms.  No N/v/d.      Past Medical History:  Diagnosis Date   Arthritis    Bipolar 1 disorder (HCC)    CKD (chronic kidney disease), stage II    COPD (chronic obstructive pulmonary disease) (HCC)    per pt no issues, denies sob, no inhaler   History of CHF (congestive heart failure)    possible CHF 2002   History of kidney stones    History of ovarian cyst 12/2007   Benign ovarian cyst s/p laparotomy for removal left side   History of sepsis    Hyperlipidemia    Hypertension    Left ureteral calculus    MDD (major depressive disorder)    Type 2 diabetes mellitus treated with insulin (HCC)    followed by pcp   (06-09-2021 pt stated check blood sugar TID,  fasting sugar -- 80--110)   Urgency of urination    Wears glasses      Home Medications Prior to Admission medications   Medication Sig Start Date End Date Taking? Authorizing Provider  acetaminophen (TYLENOL) 500 MG tablet Take 1,000 mg by mouth every 6 (six) hours as needed for mild pain.    [provider]  acetaminophen-codeine  (TYLENOL #3) 300-30 MG tablet Take 1 tablet by mouth every 6 (six) hours as needed for moderate pain or severe pain. 06/13/21   Despina Arias, MD  aspirin 81 MG EC tablet Take 81 mg by mouth every morning.    [provider]  Insulin Degludec (TRESIBA FLEXTOUCH [place]) Inject 3 Units into the skin once a week. Wednesdays    [provider]  insulin glargine (LANTUS) 100 UNIT/ML Solostar Pen Inject 25 Units into the skin every morning. Patient taking differently: Inject 25 Units into the skin every morning. 10/31/20   Shirlean Mylar, MD  Insulin Pen Needle (NOVOFINE PEN NEEDLE) 32G X 6 MM MISC 1 each by Does not apply route daily. 11/08/20   Shirlean Mylar, MD  ketorolac (TORADOL) 10 MG tablet Take 1 tablet (10 mg total) by mouth every 8 (eight) hours as needed for moderate pain or severe pain. 06/13/21   Despina Arias, MD  losartan (COZAAR) 50 MG tablet TAKE ONE TABLET BY MOUTH DAILY Patient taking differently: Take 50 mg by mouth daily. 03/14/21   Shirlean Mylar, MD  Multiple Vitamin (MULTIVITAMIN WITH MINERALS) TABS Take 1 tablet by mouth every morning.    [provider]  nitroGLYCERIN (NITROSTAT) 0.4 MG SL tablet Place 1 tablet (0.4 mg total) under the tongue every  5 (five) minutes as needed for chest pain. If after 1 tablet and 5 minutes the chest pain is still present, go to emergency room. 08/23/20   Shirlean Mylar, MD  oxybutynin (DITROPAN) 5 MG tablet Take 1 tablet (5 mg total) by mouth 3 (three) times daily as needed for bladder spasms (bladder spasms and stent pain). 06/13/21   Despina Arias, MD  phenazopyridine (PYRIDIUM) 200 MG tablet Take 1 tablet (200 mg total) by mouth 3 (three) times daily as needed for pain. 06/13/21   Despina Arias, MD  rosuvastatin (CRESTOR) 10 MG tablet TAKE ONE TABLET BY MOUTH DAILY Patient taking differently: Take 10 mg by mouth daily. 03/14/21   Shirlean Mylar, MD      Allergies    Tamsulosin    Review of Systems    Review of Systems  Constitutional:  Negative for fever.  HENT:  Negative for facial swelling.   Eyes:  Negative for redness.  Respiratory:  Negative for cough.   Cardiovascular:  Positive for chest pain.  Gastrointestinal:  Negative for vomiting.  Genitourinary:  Negative for dysuria.  Neurological:  Negative for facial asymmetry and weakness.  All other systems reviewed and are negative.   Physical Exam Updated Vital Signs BP (!) 111/54 (BP Location: Left Arm)   Pulse 75   Temp 98.8 F (37.1 C) (Oral)   Resp 19   Ht 5\' 4"  (1.626 m)   Wt 117.9 kg   SpO2 95%   BMI 44.63 kg/m  Physical Exam Vitals and nursing note reviewed.  Constitutional:      General: She is not in acute distress.    Appearance: Normal appearance. She is well-developed.  HENT:     Head: Normocephalic and atraumatic.     Nose: Nose normal.  Eyes:     Pupils: Pupils are equal, round, and reactive to light.  Cardiovascular:     Rate and Rhythm: Normal rate and regular rhythm.     Pulses: Normal pulses.     Heart sounds: Normal heart sounds.  Pulmonary:     Effort: Pulmonary effort is normal. No respiratory distress.     Breath sounds: Normal breath sounds.  Abdominal:     General: Abdomen is flat. Bowel sounds are normal. There is no distension.     Palpations: Abdomen is soft.     Tenderness: There is no abdominal tenderness. There is no guarding or rebound.  Genitourinary:    Vagina: No vaginal discharge.  Musculoskeletal:        General: Normal range of motion.     Cervical back: Normal range of motion and neck supple.  Skin:    General: Skin is warm and dry.     Capillary Refill: Capillary refill takes less than 2 seconds.     Findings: No erythema or rash.  Neurological:     General: No focal deficit present.     Mental Status: She is alert and oriented to person, place, and time.     Deep Tendon Reflexes: Reflexes normal.  Psychiatric:        Mood and Affect: Mood normal.         Behavior: Behavior normal.     ED Results / Procedures / Treatments   Labs (all labs ordered are listed, but only abnormal results are displayed) Results for orders placed or performed during the hospital encounter of 11/29/22  Resp panel by RT-PCR (RSV, Flu A&B, Covid) Anterior Nasal Swab   Specimen: Anterior Nasal Swab  Result Value Ref Range   SARS Coronavirus 2 by RT PCR NEGATIVE NEGATIVE   Influenza A by PCR NEGATIVE NEGATIVE   Influenza B by PCR NEGATIVE NEGATIVE   Resp Syncytial Virus by PCR NEGATIVE NEGATIVE  Basic metabolic panel  Result Value Ref Range   Sodium 138 135 - 145 mmol/L   Potassium 3.4 (L) 3.5 - 5.1 mmol/L   Chloride 101 98 - 111 mmol/L   CO2 23 22 - 32 mmol/L   Glucose, Bld 223 (H) 70 - 99 mg/dL   BUN 15 6 - 20 mg/dL   Creatinine, Ser 1.61 (H) 0.44 - 1.00 mg/dL   Calcium 9.7 8.9 - 09.6 mg/dL   GFR, Estimated 50 (L) >60 mL/min   Anion gap 14 5 - 15  CBC  Result Value Ref Range   WBC 13.1 (H) 4.0 - 10.5 K/uL   RBC 5.13 (H) 3.87 - 5.11 MIL/uL   Hemoglobin 15.6 (H) 12.0 - 15.0 g/dL   HCT 04.5 (H) 40.9 - 81.1 %   MCV 89.9 80.0 - 100.0 fL   MCH 30.4 26.0 - 34.0 pg   MCHC 33.8 30.0 - 36.0 g/dL   RDW 91.4 78.2 - 95.6 %   Platelets 320 150 - 400 K/uL   nRBC 0.0 0.0 - 0.2 %  Troponin I (High Sensitivity)  Result Value Ref Range   Troponin I (High Sensitivity) 24 (H) <18 ng/L  Troponin I (High Sensitivity)  Result Value Ref Range   Troponin I (High Sensitivity) 32 (H) <18 ng/L   CT Angio Chest PE W and/or Wo Contrast  Result Date: 11/30/2022 CLINICAL DATA:  Dyspnea, chronic, chest wall or pleura disease suspected. Left side chest pain EXAM: CT ANGIOGRAPHY CHEST WITH CONTRAST TECHNIQUE: Multidetector CT imaging of the chest was performed using the standard protocol during bolus administration of intravenous contrast. Multiplanar CT image reconstructions and MIPs were obtained to evaluate the vascular anatomy. RADIATION DOSE REDUCTION: This exam was performed  according to the departmental dose-optimization program which includes automated exposure control, adjustment of the mA and/or kV according to patient size and/or use of iterative reconstruction technique. CONTRAST:  75mL OMNIPAQUE IOHEXOL 350 MG/ML SOLN COMPARISON:  Chest x-ray 2 9 FINDINGS: Cardiovascular: No filling defects in the pulmonary arteries to suggest pulmonary emboli. Heart is normal size. Aorta is normal caliber. Coronary aortic calcifications. Mediastinum/Nodes: No mediastinal, hilar, or axillary adenopathy. Trachea and esophagus are unremarkable. Thyroid unremarkable. Lungs/Pleura: No confluent airspace opacities or effusions. Upper Abdomen: No acute findings Musculoskeletal: Chest wall soft tissues are unremarkable. No acute bony abnormality. Review of the MIP images confirms the above findings. IMPRESSION: No evidence of pulmonary embolus. No acute cardiopulmonary disease. Aortic Atherosclerosis (ICD10-I70.0). Electronically Signed   By: Charlett Nose M.D.   On: 11/30/2022 00:52   DG Chest 2 View  Result Date: 11/29/2022 CLINICAL DATA:  Chest pain. EXAM: CHEST - 2 VIEW COMPARISON:  Portable chest 05/22/2021 FINDINGS: The heart size and mediastinal contours are within normal limits. There is calcification in the transverse aorta. Both lungs are clear apart from mild chronic central bronchial thickening. The visualized skeletal structures are intact with thoracic spondylosis and degenerative disc disease. Cholecystectomy clips are noted on the lateral view. IMPRESSION: No active cardiopulmonary disease. Aortic atherosclerosis. Chronic central bronchial thickening. Electronically Signed   By: Almira Bar M.D.   On: 11/29/2022 22:14     EKG EKG Interpretation  Date/Time:  Thursday Nov 29 2022 20:46:42 EDT Ventricular Rate:  106 PR Interval:  146 QRS Duration: 74 QT Interval:  354 QTC Calculation: 470 R Axis:   64 Text Interpretation: Sinus tachycardia Confirmed by Chayce Rullo  ([id number]) on 11/29/2022 10:59:16 PM  Radiology CT Angio Chest PE W and/or Wo Contrast  Result Date: 11/30/2022 CLINICAL DATA:  Dyspnea, chronic, chest wall or pleura disease suspected. Left side chest pain EXAM: CT ANGIOGRAPHY CHEST WITH CONTRAST TECHNIQUE: Multidetector CT imaging of the chest was performed using the standard protocol during bolus administration of intravenous contrast. Multiplanar CT image reconstructions and MIPs were obtained to evaluate the vascular anatomy. RADIATION DOSE REDUCTION: This exam was performed according to the departmental dose-optimization program which includes automated exposure control, adjustment of the mA and/or kV according to patient size and/or use of iterative reconstruction technique. CONTRAST:  75mL OMNIPAQUE IOHEXOL 350 MG/ML SOLN COMPARISON:  Chest x-ray 2 9 FINDINGS: Cardiovascular: No filling defects in the pulmonary arteries to suggest pulmonary emboli. Heart is normal size. Aorta is normal caliber. Coronary aortic calcifications. Mediastinum/Nodes: No mediastinal, hilar, or axillary adenopathy. Trachea and esophagus are unremarkable. Thyroid unremarkable. Lungs/Pleura: No confluent airspace opacities or effusions. Upper Abdomen: No acute findings Musculoskeletal: Chest wall soft tissues are unremarkable. No acute bony abnormality. Review of the MIP images confirms the above findings. IMPRESSION: No evidence of pulmonary embolus. No acute cardiopulmonary disease. Aortic Atherosclerosis (ICD10-I70.0). Electronically Signed   By: Charlett Nose M.D.   On: 11/30/2022 00:52   DG Chest 2 View  Result Date: 11/29/2022 CLINICAL DATA:  Chest pain. EXAM: CHEST - 2 VIEW COMPARISON:  Portable chest 05/22/2021 FINDINGS: The heart size and mediastinal contours are within normal limits. There is calcification in the transverse aorta. Both lungs are clear apart from mild chronic central bronchial thickening. The visualized skeletal structures are intact with thoracic  spondylosis and degenerative disc disease. Cholecystectomy clips are noted on the lateral view. IMPRESSION: No active cardiopulmonary disease. Aortic atherosclerosis. Chronic central bronchial thickening. Electronically Signed   By: Almira Bar M.D.   On: 11/29/2022 22:14    Procedures Procedures    Medications Ordered in ED Medications  acetaminophen (TYLENOL) tablet 650 mg (has no administration in time range)    Or  acetaminophen (TYLENOL) suppository 650 mg (has no administration in time range)  ketorolac (TORADOL) 30 MG/ML injection 30 mg (30 mg Intravenous Given 11/29/22 2344)  ondansetron (ZOFRAN) injection 4 mg (4 mg Intravenous Given 11/29/22 2344)  iohexol (OMNIPAQUE) 350 MG/ML injection 75 mL (75 mLs Intravenous Contrast Given 11/30/22 0029)  acetaminophen (TYLENOL) tablet 1,000 mg (1,000 mg Oral Given 11/30/22 0458)  nitroGLYCERIN (NITROGLYN) 2 % ointment 0.5 inch (0.5 inches Topical Given 11/30/22 0459)     ED Course/ Medical Decision Making/ A&P                             Medical Decision Making Patient with headache CP and diarrhea for 1 weeks   Amount and/or Complexity of Data Reviewed External Data Reviewed: notes.    Details: Previous notes reviewed  Labs: ordered.    Details: Negative covid and flu, troponins elevated 24 and 32 with a positive delta.  Slight elevation of white count 13.1, elevated hemoglobin 15.6, normal platelets. Normal sodium 138, potassium 3.4, creatinine slight elevation 1.123  Radiology: ordered and independent interpretation performed.    Details: Negative CXR by me  ECG/medicine tests: ordered and independent interpretation performed. Decision-making details documented in ED Course. Discussion of management or test interpretation with external provider(s): Case  d/w Dr. Hyacinth Meeker who will see patient in consult   Risk OTC drugs. Prescription drug management. Decision regarding hospitalization. Risk Details: Will admit to medicine for  ongoing care.      Final Clinical Impression(s) / ED Diagnoses Final diagnoses:  Precordial pain  Viral illness     Zafira Munos, MD 11/30/22 1610

## 2022-11-30 NOTE — Consult Note (Signed)
Cardiology Consultation:   Patient ID: [name] MRN: [id number]; DOB: October 27, 1960  Admit date: 11/29/2022 Date of Consult: 11/30/2022  Primary Care Provider: Tiffany Kocher, DO Primary Cardiologist: None  Primary Electrophysiologist:  None    Patient Profile:   [name] is a 62 y.o. female with a hx of HTN, HLD, T2DM, CKD who is being seen today for the evaluation of chest pain at the request of emergency medicine.  History of Present Illness:   [name] is 62 yo female with HTN, HLD, T2DM who presents to the emergency department with chest pain. Notes that the pain is substernal and has been intermittent since Monday. She started having nausea, vomiting and diarrhea on Saturday prior to her chest pain on Monday. Notes that she has had some lightheadedness and dizziness too.   She has no known history of CAD. Prior remote history of ?CHF that resolved but this was in the 30s. She is an active smoker of 1 ppd of cigarettes. No EtOH.  She tells me that unfortunately she has not taken any of her medications for over a year due to not having insurance. She has insurance now but has not restarted any of the medications, this is including any of her diabetes medications   Not having any active chest pain currently   Past Medical History:  Diagnosis Date   Arthritis    Bipolar 1 disorder (HCC)    CKD (chronic kidney disease), stage II    COPD (chronic obstructive pulmonary disease) (HCC)    per pt no issues, denies sob, no inhaler   History of CHF (congestive heart failure)    possible CHF 2002   History of kidney stones    History of ovarian cyst 12/2007   Benign ovarian cyst s/p laparotomy for removal left side   History of sepsis    Hyperlipidemia    Hypertension    Left ureteral calculus    MDD (major depressive disorder)    Type 2 diabetes mellitus treated with insulin (HCC)    followed by pcp   (06-09-2021 pt stated check blood sugar TID,  fasting  sugar -- 80--110)   Urgency of urination    Wears glasses     Past Surgical History:  Procedure Laterality Date   CESAREAN SECTION  1987   CHOLECYSTECTOMY, LAPAROSCOPIC  11/04/2007   @[facility]    CYSTOSCOPY W/ URETERAL STENT PLACEMENT  06/06/2007   06-06-2007 [facility] ;  extraction bladder stone;   left 06-10-2013 and right  08-20-2013 both done @[facility]    CYSTOSCOPY WITH RETROGRADE PYELOGRAM, URETEROSCOPY AND STENT PLACEMENT Left 05/23/2021   Procedure: CYSTOSCOPY WITH RETROGRADE PYELOGRAM, URETEROSCOPY AND STENT PLACEMENT;  Surgeon: Noel Christmas, MD;  Location: [facility];  Service: Urology;  Laterality: Left;   CYSTOSCOPY/URETEROSCOPY/HOLMIUM LASER/STENT PLACEMENT     bilateral 12-11-2012;  left 06-10-2013;  right 08-31-2013;  right 04-12-2014;  all done @[facility]    CYSTOSCOPY/URETEROSCOPY/HOLMIUM LASER/STENT PLACEMENT Left 06/13/2021   Procedure: CYSTOSCOPY/ RETROGRADE /URETEROSCOPY/ STONE LITHOTRIPSY/ HOLMIUM LASER/STENT  REPLACEMENT;  Surgeon: Despina Arias, MD;  Location: [facility];  Service: Urology;  Laterality: Left;   INCISION AND DRAINAGE ABSCESS  02/07/2008   postoperative wound infection , post laparotomy   PERCUTANEOUS NEPHROLITHOTRIPSY Right 08/01/2007   @[facility]    PERCUTANEOUS NEPHROSTOLITHOTOMY Right 11/27/2012   @[facility]    SALPINGOOPHORECTOMY Left 01/19/2008   via Exploratory Laparotomy  @[facility] ,  benign cyst   VAGINAL HYSTERECTOMY     age 62s     Inpatient Medications: Scheduled  Meds:  acetaminophen  1,000 mg Oral Once   nitroGLYCERIN  0.5 inch Topical Once   Continuous Infusions:  PRN Meds:   Allergies:    Allergies  Allergen Reactions   Tamsulosin Shortness Of Breath    Social History:   Social History   Socioeconomic History   Marital status: Single    Spouse name: Not on file   Number of children: Not on file   Years of education: Not on file   Highest education level: Not on file  Occupational History   Occupation: Security Guard    Comment:  Designer, industrial/product  Tobacco Use   Smoking status: Every Day    Packs/day: 1.00    Years: 44.00    Additional pack years: 0.00    Total pack years: 44.00    Types: Cigarettes   Smokeless tobacco: Never  Vaping Use   Vaping Use: Never used  Substance and Sexual Activity   Alcohol use: Not Currently   Drug use: No    Comment: 06-09-2021 pt denies;  pt had positive UDS 05-22-2021 for cannibus and benzodiazepines   Sexual activity: Not Currently  Other Topics Concern   Not on file  Social History Narrative   Not on file   Social Determinants of Health   Financial Resource Strain: Not on file  Food Insecurity: Not on file  Transportation Needs: Not on file  Physical Activity: Not on file  Stress: Not on file  Social Connections: Not on file  Intimate Partner Violence: Not on file    Family History:   History reviewed. No pertinent family history.   Review of Systems: 12 point ROS negative unless otherwise listed in the HPI  Physical Exam/Data:   Vitals:   11/29/22 2114 11/30/22 0000 11/30/22 0030 11/30/22 0230  BP:  (!) 145/66 (!) 111/54 (!) 107/54  Pulse:  89 75 75  Resp:  (!) 22 19 12   Temp:  98.8 F (37.1 C)  98.3 F (36.8 C)  TempSrc:  Oral  Oral  SpO2:  97% 95% 95%  Weight: 117.9 kg     Height: 5\' 4"  (1.626 m)      No intake or output data in the 24 hours ending 11/30/22 0445 Filed Weights   11/29/22 2114  Weight: 117.9 kg   Body mass index is 44.63 kg/m.  General: obese female in no acute distress HEENT: normal Lymph: no adenopathy Neck: no JVD Endocrine:  No thryomegaly Vascular: No carotid bruits; FA pulses 2+ bilaterally without bruits  Cardiac:  normal S1, S2; RRR; no murmur  Lungs:  clear to auscultation bilaterally, no wheezing, rhonchi or rales  Abd: soft, nontender, no hepatomegaly  Ext: no edema Musculoskeletal:  No deformities, BUE and BLE strength normal and equal Skin: warm and dry  Neuro:  CNs 2-12 intact, no focal  abnormalities noted Psych:  Normal affect   EKG:  The EKG was personally reviewed and demonstrates:  no st or t wave changes  Telemetry:  Telemetry was personally reviewed and demonstrates:  normal sinus rhtyhm   Relevant CV Studies: Echo 2022: normal LV/RV function  Laboratory Data:  Chemistry Recent Labs  Lab 11/29/22 2119  NA 138  K 3.4*  CL 101  CO2 23  GLUCOSE 223*  BUN 15  CREATININE 1.23*  CALCIUM 9.7  GFRNONAA 50*  ANIONGAP 14    No results for input(s): "PROT", "ALBUMIN", "AST", "ALT", "ALKPHOS", "BILITOT" in the last 168 hours. Hematology Recent Labs  Lab 11/29/22 2119  WBC 13.1*  RBC 5.13*  HGB 15.6*  HCT 46.1*  MCV 89.9  MCH 30.4  MCHC 33.8  RDW 13.0  PLT 320   Cardiac EnzymesNo results for input(s): "TROPONINI" in the last 168 hours. No results for input(s): "TROPIPOC" in the last 168 hours.  BNPNo results for input(s): "BNP", "PROBNP" in the last 168 hours.  DDimer No results for input(s): "DDIMER" in the last 168 hours.  Radiology/Studies:  CT Angio Chest PE W and/or Wo Contrast  Result Date: 11/30/2022 CLINICAL DATA:  Dyspnea, chronic, chest wall or pleura disease suspected. Left side chest pain EXAM: CT ANGIOGRAPHY CHEST WITH CONTRAST TECHNIQUE: Multidetector CT imaging of the chest was performed using the standard protocol during bolus administration of intravenous contrast. Multiplanar CT image reconstructions and MIPs were obtained to evaluate the vascular anatomy. RADIATION DOSE REDUCTION: This exam was performed according to the departmental dose-optimization program which includes automated exposure control, adjustment of the mA and/or kV according to patient size and/or use of iterative reconstruction technique. CONTRAST:  75mL OMNIPAQUE IOHEXOL 350 MG/ML SOLN COMPARISON:  Chest x-ray 2 9 FINDINGS: Cardiovascular: No filling defects in the pulmonary arteries to suggest pulmonary emboli. Heart is normal size. Aorta is normal caliber. Coronary  aortic calcifications. Mediastinum/Nodes: No mediastinal, hilar, or axillary adenopathy. Trachea and esophagus are unremarkable. Thyroid unremarkable. Lungs/Pleura: No confluent airspace opacities or effusions. Upper Abdomen: No acute findings Musculoskeletal: Chest wall soft tissues are unremarkable. No acute bony abnormality. Review of the MIP images confirms the above findings. IMPRESSION: No evidence of pulmonary embolus. No acute cardiopulmonary disease. Aortic Atherosclerosis (ICD10-I70.0). Electronically Signed   By: Charlett Nose M.D.   On: 11/30/2022 00:52   DG Chest 2 View  Result Date: 11/29/2022 CLINICAL DATA:  Chest pain. EXAM: CHEST - 2 VIEW COMPARISON:  Portable chest 05/22/2021 FINDINGS: The heart size and mediastinal contours are within normal limits. There is calcification in the transverse aorta. Both lungs are clear apart from mild chronic central bronchial thickening. The visualized skeletal structures are intact with thoracic spondylosis and degenerative disc disease. Cholecystectomy clips are noted on the lateral view. IMPRESSION: No active cardiopulmonary disease. Aortic atherosclerosis. Chronic central bronchial thickening. Electronically Signed   By: Almira Bar M.D.   On: 11/29/2022 22:14    Assessment and Plan:   Chest pain with elevated troponin Her story suggests that maybe she is having a viral diarrheal illness that may be triggering her symptoms and slight troponin leak. However, it is concerning that she has risk factors and has not been on any medical therapy for over a year plus active smoking. That said, I would be hesitant to rush her to cath lab given access / adherence issues. Would start with an echo and observation. If she remains pain free then should consider coronary Cta for eval while managing diarrheal illness supportively.   - order echo - lipids, lp(a), hemoglobin A1c - consider coronary CTa      For questions or updates, please contact [facility] Please consult [email or web address] for contact info under     Signed, Joellen Jersey, MD  11/30/2022 4:45 AM

## 2022-11-30 NOTE — Progress Notes (Signed)
  Echocardiogram 2D Echocardiogram has been performed.  Jaquel Coomer Wynn Banker 11/30/2022, 5:08 PM

## 2022-11-30 NOTE — ED Notes (Signed)
ED TO INPATIENT HANDOFF REPORT  ED Nurse Name and Phone #:  Johnna Acosta [phone]  S Name/Age/Gender [name] 62 y.o. female Room/Bed: 022C/022C  Code Status   Code Status: Full Code  Home/SNF/Other Home Patient oriented to: self, place, time, and situation Is this baseline? Yes   Triage Complete: Triage complete  Chief Complaint Chest pain [R07.9]  Triage Note Monday pt began having left sided chest pressure that radiates to right arm. Also endorses SOB. Nothing makes the pain better or worse. Chest pain started while pt was watching tv. Pt does have history of CHF   Allergies Allergies  Allergen Reactions   Tamsulosin Shortness Of Breath    Level of Care/Admitting Diagnosis ED Disposition     ED Disposition  Admit   Condition  --   Comment  Hospital Area: [facility] [[id number]]  Level of Care: Telemetry Cardiac [[id number]]  May admit patient to [facility] or [facility] if equivalent level of care is available:: No  Covid Evaluation: Asymptomatic - no recent exposure (last 10 days) testing not required  Diagnosis: Chest pain [147829]  Admitting Physician: Angie Fava [[id number]]  Attending Physician: Angie Fava [[id number]]  Certification:: I certify this patient will need inpatient services for at least 2 midnights  Estimated Length of Stay: 2          B Medical/Surgery History Past Medical History:  Diagnosis Date   Arthritis    Bipolar 1 disorder (HCC)    CKD (chronic kidney disease), stage II    COPD (chronic obstructive pulmonary disease) (HCC)    per pt no issues, denies sob, no inhaler   History of CHF (congestive heart failure)    possible CHF 2002   History of kidney stones    History of ovarian cyst 12/2007   Benign ovarian cyst s/p laparotomy for removal left side   History of sepsis    Hyperlipidemia    Hypertension    Left ureteral calculus    MDD (major depressive disorder)    Type 2 diabetes mellitus  treated with insulin (HCC)    followed by pcp   (06-09-2021 pt stated check blood sugar TID,  fasting sugar -- 80--110)   Urgency of urination    Wears glasses    Past Surgical History:  Procedure Laterality Date   CESAREAN SECTION  1987   CHOLECYSTECTOMY, LAPAROSCOPIC  11/04/2007   @[facility]    CYSTOSCOPY W/ URETERAL STENT PLACEMENT  06/06/2007   06-06-2007 @[facility] ;  extraction bladder stone;   left 06-10-2013 and right  08-20-2013 both done @[facility]    CYSTOSCOPY WITH RETROGRADE PYELOGRAM, URETEROSCOPY AND STENT PLACEMENT Left 05/23/2021   Procedure: CYSTOSCOPY WITH RETROGRADE PYELOGRAM, URETEROSCOPY AND STENT PLACEMENT;  Surgeon: Noel Christmas, MD;  Location: [facility];  Service: Urology;  Laterality: Left;   CYSTOSCOPY/URETEROSCOPY/HOLMIUM LASER/STENT PLACEMENT     bilateral 12-11-2012;  left 06-10-2013;  right 08-31-2013;  right 04-12-2014;  all done @[facility]    CYSTOSCOPY/URETEROSCOPY/HOLMIUM LASER/STENT PLACEMENT Left 06/13/2021   Procedure: CYSTOSCOPY/ RETROGRADE /URETEROSCOPY/ STONE LITHOTRIPSY/ HOLMIUM LASER/STENT  REPLACEMENT;  Surgeon: Despina Arias, MD;  Location: [facility];  Service: Urology;  Laterality: Left;   INCISION AND DRAINAGE ABSCESS  02/07/2008   postoperative wound infection , post laparotomy   PERCUTANEOUS NEPHROLITHOTRIPSY Right 08/01/2007   @[facility]    PERCUTANEOUS NEPHROSTOLITHOTOMY Right 11/27/2012   @[facility]    SALPINGOOPHORECTOMY Left 01/19/2008   via Exploratory Laparotomy  @[facility] ,  benign cyst   VAGINAL HYSTERECTOMY  age 22s     A IV Location/Drains/Wounds Patient Lines/Drains/Airways Status     Active Line/Drains/Airways     Name Placement date Placement time Site Days   Peripheral IV 11/29/22 20 G Right Antecubital 11/29/22  2333  Antecubital  1   Ureteral Drain/Stent Left ureter 6 Fr. 06/13/21  1332  Left ureter  535   Incision (Closed) 06/13/21 Perineum Left 06/13/21  1227  -- 535            Intake/Output Last 24 hours No intake or  output data in the 24 hours ending 11/30/22 0507  Labs/Imaging Results for orders placed or performed during the hospital encounter of 11/29/22 (from the past 48 hour(s))  Basic metabolic panel     Status: Abnormal   Collection Time: 11/29/22  9:19 PM  Result Value Ref Range   Sodium 138 135 - 145 mmol/L   Potassium 3.4 (L) 3.5 - 5.1 mmol/L   Chloride 101 98 - 111 mmol/L   CO2 23 22 - 32 mmol/L   Glucose, Bld 223 (H) 70 - 99 mg/dL    Comment: Glucose reference range applies only to samples taken after fasting for at least 8 hours.   BUN 15 6 - 20 mg/dL   Creatinine, Ser 4.54 (H) 0.44 - 1.00 mg/dL   Calcium 9.7 8.9 - 09.8 mg/dL   GFR, Estimated 50 (L) >60 mL/min    Comment: (NOTE) Calculated using the CKD-EPI Creatinine Equation (2021)    Anion gap 14 5 - 15    Comment: Performed at [facility], [address] [place], [place] [address]  CBC     Status: Abnormal   Collection Time: 11/29/22  9:19 PM  Result Value Ref Range   WBC 13.1 (H) 4.0 - 10.5 K/uL   RBC 5.13 (H) 3.87 - 5.11 MIL/uL   Hemoglobin 15.6 (H) 12.0 - 15.0 g/dL   HCT 78.2 (H) 95.6 - 21.3 %   MCV 89.9 80.0 - 100.0 fL   MCH 30.4 26.0 - 34.0 pg   MCHC 33.8 30.0 - 36.0 g/dL   RDW 08.6 57.8 - 46.9 %   Platelets 320 150 - 400 K/uL   nRBC 0.0 0.0 - 0.2 %    Comment: Performed at [facility], [address] [place], [place] [address]  Troponin I (High Sensitivity)     Status: Abnormal   Collection Time: 11/29/22  9:19 PM  Result Value Ref Range   Troponin I (High Sensitivity) 24 (H) <18 ng/L    Comment: (NOTE) Elevated high sensitivity troponin I (hsTnI) values and significant  changes across serial measurements may suggest ACS but many other  chronic and acute conditions are known to elevate hsTnI results.  Refer to the "Links" section for chest pain algorithms and additional  guidance. Performed at [facility], [address], [place], [place] [address]   Troponin I (High Sensitivity)      Status: Abnormal   Collection Time: 11/29/22 11:38 PM  Result Value Ref Range   Troponin I (High Sensitivity) 32 (H) <18 ng/L    Comment: (NOTE) Elevated high sensitivity troponin I (hsTnI) values and significant  changes across serial measurements may suggest ACS but many other  chronic and acute conditions are known to elevate hsTnI results.  Refer to the "Links" section for chest pain algorithms and additional  guidance. Performed at [facility], [address], [place], [place] [address]   Resp panel by  RT-PCR (RSV, Flu A&B, Covid) Anterior Nasal Swab     Status: None   Collection Time: 11/30/22  1:57 AM   Specimen: Anterior Nasal Swab  Result Value Ref Range   SARS Coronavirus 2 by RT PCR NEGATIVE NEGATIVE   Influenza A by PCR NEGATIVE NEGATIVE   Influenza B by PCR NEGATIVE NEGATIVE    Comment: (NOTE) The Xpert Xpress SARS-CoV-2/FLU/RSV plus assay is intended as an aid in the diagnosis of influenza from Nasopharyngeal swab specimens and should not be used as a sole basis for treatment. Nasal washings and aspirates are unacceptable for Xpert Xpress SARS-CoV-2/FLU/RSV testing.  Fact Sheet for Patients: BloggerCourse.com  Fact Sheet for Healthcare Providers: SeriousBroker.it  This test is not yet approved or cleared by the [place] FDA and has been authorized for detection and/or diagnosis of SARS-CoV-2 by FDA under an Emergency Use Authorization (EUA). This EUA will remain in effect (meaning this test can be used) for the duration of the COVID-19 declaration under Section 564(b)(1) of the Act, 21 U.S.C. section 360bbb-3(b)(1), unless the authorization is terminated or revoked.     Resp Syncytial Virus by PCR NEGATIVE NEGATIVE    Comment: (NOTE) Fact Sheet for Patients: BloggerCourse.com  Fact Sheet for Healthcare Providers: SeriousBroker.it  This test is  not yet approved or cleared by the [place] FDA and has been authorized for detection and/or diagnosis of SARS-CoV-2 by FDA under an Emergency Use Authorization (EUA). This EUA will remain in effect (meaning this test can be used) for the duration of the COVID-19 declaration under Section 564(b)(1) of the Act, 21 U.S.C. section 360bbb-3(b)(1), unless the authorization is terminated or revoked.  Performed at [facility], [address] [place], [place] [address]    CT Angio Chest PE W and/or Wo Contrast  Result Date: 11/30/2022 CLINICAL DATA:  Dyspnea, chronic, chest wall or pleura disease suspected. Left side chest pain EXAM: CT ANGIOGRAPHY CHEST WITH CONTRAST TECHNIQUE: Multidetector CT imaging of the chest was performed using the standard protocol during bolus administration of intravenous contrast. Multiplanar CT image reconstructions and MIPs were obtained to evaluate the vascular anatomy. RADIATION DOSE REDUCTION: This exam was performed according to the departmental dose-optimization program which includes automated exposure control, adjustment of the mA and/or kV according to patient size and/or use of iterative reconstruction technique. CONTRAST:  75mL OMNIPAQUE IOHEXOL 350 MG/ML SOLN COMPARISON:  Chest x-ray 2 9 FINDINGS: Cardiovascular: No filling defects in the pulmonary arteries to suggest pulmonary emboli. Heart is normal size. Aorta is normal caliber. Coronary aortic calcifications. Mediastinum/Nodes: No mediastinal, hilar, or axillary adenopathy. Trachea and esophagus are unremarkable. Thyroid unremarkable. Lungs/Pleura: No confluent airspace opacities or effusions. Upper Abdomen: No acute findings Musculoskeletal: Chest wall soft tissues are unremarkable. No acute bony abnormality. Review of the MIP images confirms the above findings. IMPRESSION: No evidence of pulmonary embolus. No acute cardiopulmonary disease. Aortic Atherosclerosis (ICD10-I70.0). Electronically Signed    By: Charlett Nose M.D.   On: 11/30/2022 00:52   DG Chest 2 View  Result Date: 11/29/2022 CLINICAL DATA:  Chest pain. EXAM: CHEST - 2 VIEW COMPARISON:  Portable chest 05/22/2021 FINDINGS: The heart size and mediastinal contours are within normal limits. There is calcification in the transverse aorta. Both lungs are clear apart from mild chronic central bronchial thickening. The visualized skeletal structures are intact with thoracic spondylosis and degenerative disc disease. Cholecystectomy clips are noted on the lateral view. IMPRESSION: No active cardiopulmonary disease. Aortic atherosclerosis. Chronic central bronchial thickening. Electronically Signed  By: Almira Bar M.D.   On: 11/29/2022 22:14    Pending Labs Unresulted Labs (From admission, onward)     Start     Ordered   11/30/22 0504  Magnesium  Add-on,   AD        11/30/22 0503            Vitals/Pain Today's Vitals   11/30/22 0000 11/30/22 0030 11/30/22 0230 11/30/22 0457  BP: (!) 145/66 (!) 111/54 (!) 107/54 (!) 120/52  Pulse: 89 75 75 75  Resp: (!) 22 19 12    Temp: 98.8 F (37.1 C)  98.3 F (36.8 C)   TempSrc: Oral  Oral   SpO2: 97% 95% 95% 96%  Weight:      Height:      PainSc:   0-No pain 0-No pain    Isolation Precautions No active isolations  Medications Medications  acetaminophen (TYLENOL) tablet 650 mg (has no administration in time range)    Or  acetaminophen (TYLENOL) suppository 650 mg (has no administration in time range)  ketorolac (TORADOL) 30 MG/ML injection 30 mg (30 mg Intravenous Given 11/29/22 2344)  ondansetron (ZOFRAN) injection 4 mg (4 mg Intravenous Given 11/29/22 2344)  iohexol (OMNIPAQUE) 350 MG/ML injection 75 mL (75 mLs Intravenous Contrast Given 11/30/22 0029)  acetaminophen (TYLENOL) tablet 1,000 mg (1,000 mg Oral Given 11/30/22 0458)  nitroGLYCERIN (NITROGLYN) 2 % ointment 0.5 inch (0.5 inches Topical Given 11/30/22 0459)    Mobility walks     Focused Assessments Cardiac  Assessment Handoff:    No results found for: "CKTOTAL", "CKMB", "CKMBINDEX", "TROPONINI" Lab Results  Component Value Date   DDIMER 0.29 08/26/2020   Does the Patient currently have chest pain? No    R Recommendations: See Admitting Provider Note  Report given to:   Additional Notes:

## 2022-11-30 NOTE — Progress Notes (Signed)
Will pick up care of [facility] patient 0700 12/01/22. Relayed to [facility], appreciate their assistance.    Alfredo Martinez, MD

## 2022-11-30 NOTE — Progress Notes (Signed)
  Pt arrived unit at 0630. Admitted for chest pain evaluation. A/ox4. Currently denies any CP/SOB. NSR and stable vital signs upon admission. Stated she is diabetic but has not been taken all her meds for a year due to lack of insurance. BG check 143. Skin check with no breakdown noted. Tylenol for c/o mild headache. No further concerns at this time. Call bell within reach.

## 2022-11-30 NOTE — H&P (Addendum)
History and Physical    Patient: [name]:[id number] DOB: May 01, 1961 DOA: 11/29/2022 DOS: the patient was seen and examined on 11/30/2022 PCP: Tiffany Kocher, DO  Patient coming from: Home  Chief Complaint:  Chief Complaint  Patient presents with   Chest Pain   HPI: [name] is a 62 y.o. female with medical history significant of bipolar disorder, hypertension, diabetes mellitus 2 currently not on medications, tobacco abuse, dyslipidemia, obese with a BMI of 43, chronic kidney disease likely secondary to diabetic nephropathy.  Patient presented to the hospital with complaints of chest pain.  Subsequent evaluation in the ED reveals minimally elevated troponins and normal EKG.  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  The symptoms she reported to me were not typical noting she had a heaviness in the left anterior chest that changed to a generalized burning and hot sensation throughout her body and was better in a few minutes without active treatment.  She did have a headache.  No prior symptoms and no exertional symptoms.  In the ED she was afebrile with normal vital signs.  Troponin and EKG as above.  She has not had any recurrence of chest pain since arrival.  She reports that she does not consistently check her CBGs and that she is supposed to be on Novolin and then or but has not taken because she did not have any type of insurance to pay for her medications.  In regards to recent GI symptoms of diarrhea and nausea I was able to clarify with the patient she was having loose stools.  She was not having any emesis or fever or abdominal pain.  She has not had any changes in her medications.  She does state that she had been placed on Ozempic at 1 point but was intolerant due to GI symptoms.   Review of Systems: As mentioned in the  history of present illness. All other systems reviewed and are negative. Past Medical History:  Diagnosis Date   Arthritis    Bipolar 1 disorder (HCC)    CKD (chronic kidney disease), stage II    COPD (chronic obstructive pulmonary disease) (HCC)    per pt no issues, denies sob, no inhaler   History of CHF (congestive heart failure)    possible CHF 2002   History of kidney stones    History of ovarian cyst 12/2007   Benign ovarian cyst s/p laparotomy for removal left side   History of sepsis    Hyperlipidemia    Hypertension    Left ureteral calculus    MDD (major depressive disorder)    Type 2 diabetes mellitus treated with insulin (HCC)    followed by pcp   (06-09-2021 pt stated check blood sugar TID,  fasting sugar -- 80--110)   Urgency of urination    Wears glasses    Past Surgical History:  Procedure Laterality Date   CESAREAN SECTION  1987   CHOLECYSTECTOMY, LAPAROSCOPIC  11/04/2007   @[facility]    CYSTOSCOPY W/ URETERAL STENT PLACEMENT  06/06/2007   06-06-2007 @[facility] ;  extraction bladder stone;   left 06-10-2013 and right  08-20-2013 both done @[facility]    CYSTOSCOPY WITH RETROGRADE PYELOGRAM, URETEROSCOPY AND STENT PLACEMENT Left 05/23/2021   Procedure: CYSTOSCOPY WITH RETROGRADE PYELOGRAM, URETEROSCOPY AND STENT PLACEMENT;  Surgeon: Noel Christmas, MD;  Location: [facility];  Service: Urology;  Laterality:  Left;   CYSTOSCOPY/URETEROSCOPY/HOLMIUM LASER/STENT PLACEMENT     bilateral 12-11-2012;  left 06-10-2013;  right 08-31-2013;  right 04-12-2014;  all done @[facility]    CYSTOSCOPY/URETEROSCOPY/HOLMIUM LASER/STENT PLACEMENT Left 06/13/2021   Procedure: CYSTOSCOPY/ RETROGRADE /URETEROSCOPY/ STONE LITHOTRIPSY/ HOLMIUM LASER/STENT  REPLACEMENT;  Surgeon: Despina Arias, MD;  Location: [facility];  Service: Urology;  Laterality: Left;   INCISION AND DRAINAGE ABSCESS  02/07/2008   postoperative wound infection , post laparotomy   PERCUTANEOUS NEPHROLITHOTRIPSY Right 08/01/2007    @[facility]    PERCUTANEOUS NEPHROSTOLITHOTOMY Right 11/27/2012   @[facility]    SALPINGOOPHORECTOMY Left 01/19/2008   via Exploratory Laparotomy  @[facility] ,  benign cyst   VAGINAL HYSTERECTOMY     age 5s   Social History:  reports that she has been smoking cigarettes. She has a 44.00 pack-year smoking history. She has never used smokeless tobacco. She reports that she does not currently use alcohol. She reports that she does not use drugs.  Allergies  Allergen Reactions   Tamsulosin Shortness Of Breath    History reviewed. No pertinent family history.  Prior to Admission medications   Medication Sig Start Date End Date Taking? Authorizing Provider  acetaminophen (TYLENOL) 500 MG tablet Take 1,000 mg by mouth every 6 (six) hours as needed for mild pain.    [provider]  acetaminophen-codeine (TYLENOL #3) 300-30 MG tablet Take 1 tablet by mouth every 6 (six) hours as needed for moderate pain or severe pain. 06/13/21   Despina Arias, MD  aspirin 81 MG EC tablet Take 81 mg by mouth every morning.    [provider]  Insulin Degludec (TRESIBA FLEXTOUCH [place]) Inject 3 Units into the skin once a week. Wednesdays    [provider]  insulin glargine (LANTUS) 100 UNIT/ML Solostar Pen Inject 25 Units into the skin every morning. Patient taking differently: Inject 25 Units into the skin every morning. 10/31/20   Shirlean Mylar, MD  Insulin Pen Needle (NOVOFINE PEN NEEDLE) 32G X 6 MM MISC 1 each by Does not apply route daily. 11/08/20   Shirlean Mylar, MD  ketorolac (TORADOL) 10 MG tablet Take 1 tablet (10 mg total) by mouth every 8 (eight) hours as needed for moderate pain or severe pain. 06/13/21   Despina Arias, MD  losartan (COZAAR) 50 MG tablet TAKE ONE TABLET BY MOUTH DAILY Patient taking differently: Take 50 mg by mouth daily. 03/14/21   Shirlean Mylar, MD  Multiple Vitamin (MULTIVITAMIN WITH MINERALS) TABS Take 1 tablet by mouth every morning.    [provider]   nitroGLYCERIN (NITROSTAT) 0.4 MG SL tablet Place 1 tablet (0.4 mg total) under the tongue every 5 (five) minutes as needed for chest pain. If after 1 tablet and 5 minutes the chest pain is still present, go to emergency room. 08/23/20   Shirlean Mylar, MD  oxybutynin (DITROPAN) 5 MG tablet Take 1 tablet (5 mg total) by mouth 3 (three) times daily as needed for bladder spasms (bladder spasms and stent pain). 06/13/21   Despina Arias, MD  phenazopyridine (PYRIDIUM) 200 MG tablet Take 1 tablet (200 mg total) by mouth 3 (three) times daily as needed for pain. 06/13/21   Despina Arias, MD  rosuvastatin (CRESTOR) 10 MG tablet TAKE ONE TABLET BY MOUTH DAILY Patient taking differently: Take 10 mg by mouth daily. 03/14/21   Shirlean Mylar, MD    Physical Exam: Vitals:   11/30/22 0457 11/30/22 0530 11/30/22 0631 11/30/22 0825  BP: (!) 120/52 (!) 151/65 121/62 Marland Kitchen)  109/57  Pulse: 75 73 73 77  Resp:  13 14   Temp:   98.1 F (36.7 C)   TempSrc:   Oral   SpO2: 96% 95% 91% 93%  Weight:   114.7 kg   Height:   5\' 4"  (1.626 m)    Constitutional: NAD, calm, comfortable Respiratory: clear to auscultation bilaterally, no wheezing, no crackles. Normal respiratory effort. No accessory muscle use.  Cardiovascular: Regular rate and rhythm, no murmurs / rubs / gallops. No extremity edema. 2+ pedal pulses.  Abdomen: no tenderness, no masses palpated.  Bowel sounds positive.  Musculoskeletal: no clubbing / cyanosis. No joint deformity upper and lower extremities. Good ROM, no contractures. Normal muscle tone.  Skin: no rashes, lesions, ulcers. No induration Neurologic: CN 2-12 grossly intact. Sensation intact, Strength 5/5 x all 4 extremities.  Psychiatric: Normal judgment and insight. Alert and oriented x 3. Normal mood.  Of note patient has been inconsistent in history reporting when questioned by multiple providers.   Data Reviewed:  Sodium 138, potassium 3.4, glucose 223, BUN 15, creatinine 1.23,  troponin 24 and 32  WBC 13,100 with hemoglobin 15.6 and platelets 320,000  Influenza A, B, RSV and COVID PCR all negative  Assessment and Plan: Atypical chest pain Appreciate cardiology assistance Follow-up on echocardiogram No recurrence of chest pain and symptoms as described above not consistent with ischemic event noting troponins only minimally elevated in the setting of dehydration from recent GI losses and EKG unremarkable therefore additional workup can be completed in the outpatient setting  Diarrhea, dehydration, abnormal urinalysis Patient did report some urinary dysuria and frequency and does have an abnormal urinalysis although similar findings in urine can be from dehydration Check urine culture Begin empiric Rocephin  Diabetes mellitus 2 with hyperglycemia Patient reports has been unable to obtain NovoLog N. or NovoLog R and does not consistently check her CBGs Check hemoglobin A1c Began following CBGs with meals and initiate SSI Will need assistance of pharmacy to review this patient payer source to determine best insulin to prescribe to patient upon discharge Previously had been on Tresiba and Lantus but no longer taking  CKD IIIa Check urine microalbumin Current urinalysis demonstrates 30 of protein but patient may have concomitant UTI  Hypertension Current blood pressure well-controlled Hold Cozaar for now  HLD Previously on Crestor Check lipid panel  Obesity with BMI 43 Weight loss reduction strategies per PCP Due to GI side effects did not tolerate Ozempic/GLP-1    Advance Care Planning:   Code Status: Full Code   DVT prophylaxis: Lovenox  Consults: Cardiology  Family Communication: Family member at bedside with patient permission  Severity of Illness: The appropriate patient status for this patient is OBSERVATION. Observation status is judged to be reasonable and necessary in order to provide the required intensity of service to ensure the  patient's safety. The patient's presenting symptoms, physical exam findings, and initial radiographic and laboratory data in the context of their medical condition is felt to place them at decreased risk for further clinical deterioration. Furthermore, it is anticipated that the patient will be medically stable for discharge from the hospital within 2 midnights of admission.   Author: Junious Silk, NP 11/30/2022 11:38 AM  For on call review [email or web address].    62 year old lady with prior h/o hypertension, Type 2 DM not on meds, obesity, tobacco abuse, hyperlipidemia, CKD sec to DM, admitted for atypical chest pain.  ACS ruled out. Cardiology consulted and recommended Echocardiogram for further evaluation.  Meanwhile her symptoms of nausea and diarrhea have improved with symptomatic management. She was also found to have abnormal UA, Was empirically started on IV rocephin for possible UTI.   General exam: Appears calm and comfortable  Respiratory system: Clear to auscultation. Respiratory effort normal. Cardiovascular system: S1 & S2 heard, RRR. No JVD, . Gastrointestinal system: Abdomen is nondistended, soft and nontender. Central nervous system: Alert and oriented.  Grossly non focal Extremities: Symmetric 5 x 5 power. Skin: No rashes,  Psychiatry: flat affect,  Plan : Continue with IV ceftriaxone, will need outpatient follow up with PCP and restart her on anti diabetic medications.  Family Medicine to take over the care of the patient in am.   Kathlen Mody, MD

## 2022-11-30 NOTE — Progress Notes (Signed)
Carryover admission to the Day Admitter.  I discussed this case with the EDP, Dr. Nicanor Alcon.  Per these discussions:   This is a 62 year old female with known history of coronary artery disease, who presents with intermittent chest pain that has completely resolved following nitroglycerin paste, Toradol, and acetaminophen.  Her intermittent chest pain is also been associated with cough, congestion, diarrhea.  Troponin mildly elevated.  No report of any associated acute ischemic changes on EKG.  EDP discussed patient's case with on-call cardiology, Dr. Hyacinth Meeker, Who will formally consult and see the patient, with additional recommendations pending at this time.  The patient is currently chest pain-free.  I have placed an order for admission to cardiac telemetry for further evaluation and management of the above.  I have placed some additional preliminary admit orders via the adult multi-morbid admission order set. I have also ordered add on magnesium level.    Newton Pigg, DO Hospitalist

## 2022-11-30 NOTE — Progress Notes (Addendum)
Rounding Note    Patient Name: [name] Date of Encounter: 11/30/2022  [facility] Cardiologist: None   Subjective   Complains of headache and nausea. Has not had chest pain since Monday (once); Chest pain occurred while she was canning spaghetti sauce, so clearly not exertional.  And she has not had any further symptoms now.  Inpatient Medications    Scheduled Meds:  insulin aspart  0-5 Units Subcutaneous QHS   insulin aspart  0-9 Units Subcutaneous TID WC   potassium chloride  40 mEq Oral Once   Continuous Infusions:  sodium chloride 100 mL/hr at 11/30/22 0846   PRN Meds: acetaminophen **OR** acetaminophen   Vital Signs    Vitals:   11/30/22 0457 11/30/22 0530 11/30/22 0631 11/30/22 0825  BP: (!) 120/52 (!) 151/65 121/62 (!) 109/57  Pulse: 75 73 73 77  Resp:  13 14   Temp:   98.1 F (36.7 C)   TempSrc:   Oral   SpO2: 96% 95% 91% 93%  Weight:   114.7 kg   Height:   5\' 4"  (1.626 m)    No intake or output data in the 24 hours ending 11/30/22 0931    11/30/2022    6:31 AM 11/29/2022    9:14 PM 06/13/2021   10:51 AM  Last 3 Weights  Weight (lbs) 252 lb 13.9 oz 260 lb 241 lb 8 oz  Weight (kg) 114.7 kg 117.935 kg 109.544 kg      Telemetry    Normal sinus rhythm heart rates in the 70s.- Personally Reviewed  ECG    Sinus tachycardia, heart rate 106.  No acute ST-T wave changes. - Personally Reviewed  Physical Exam   GEN: No acute distress.  Morbidly obese Neck: No JVD Cardiac: RRR, no murmurs, rubs, or gallops.  Distant heart sounds Respiratory: Clear to auscultation bilaterally. GI: Soft, nontender, non-distended  MS: No edema; No deformity. Neuro:  Nonfocal  Psych: Normal affect   Labs    High Sensitivity Troponin:   Recent Labs  Lab 11/29/22 2119 11/29/22 2338  TROPONINIHS 24* 32*     Chemistry Recent Labs  Lab 11/29/22 2119 11/29/22 2338  NA 138  --   K 3.4*  --   CL 101  --   CO2 23  --   GLUCOSE 223*  --    BUN 15  --   CREATININE 1.23*  --   CALCIUM 9.7  --   MG  --  2.0  GFRNONAA 50*  --   ANIONGAP 14  --     Lipids No results for input(s): "CHOL", "TRIG", "HDL", "LABVLDL", "LDLCALC", "CHOLHDL" in the last 168 hours.  Hematology Recent Labs  Lab 11/29/22 2119  WBC 13.1*  RBC 5.13*  HGB 15.6*  HCT 46.1*  MCV 89.9  MCH 30.4  MCHC 33.8  RDW 13.0  PLT 320   Thyroid No results for input(s): "TSH", "FREET4" in the last 168 hours.  BNPNo results for input(s): "BNP", "PROBNP" in the last 168 hours.  DDimer No results for input(s): "DDIMER" in the last 168 hours.   Radiology    CT Angio Chest PE W and/or Wo Contrast  Result Date: 11/30/2022 CLINICAL DATA:  Dyspnea, chronic, chest wall or pleura disease suspected. Left side chest pain EXAM: CT ANGIOGRAPHY CHEST WITH CONTRAST TECHNIQUE: Multidetector CT imaging of the chest was performed using the standard protocol during bolus administration of intravenous contrast. Multiplanar CT image reconstructions and MIPs were obtained to  evaluate the vascular anatomy. RADIATION DOSE REDUCTION: This exam was performed according to the departmental dose-optimization program which includes automated exposure control, adjustment of the mA and/or kV according to patient size and/or use of iterative reconstruction technique. CONTRAST:  75mL OMNIPAQUE IOHEXOL 350 MG/ML SOLN COMPARISON:  Chest x-ray 2 9 FINDINGS: Cardiovascular: No filling defects in the pulmonary arteries to suggest pulmonary emboli. Heart is normal size. Aorta is normal caliber. Coronary aortic calcifications. Mediastinum/Nodes: No mediastinal, hilar, or axillary adenopathy. Trachea and esophagus are unremarkable. Thyroid unremarkable. Lungs/Pleura: No confluent airspace opacities or effusions. Upper Abdomen: No acute findings Musculoskeletal: Chest wall soft tissues are unremarkable. No acute bony abnormality. Review of the MIP images confirms the above findings. IMPRESSION: No evidence of  pulmonary embolus. No acute cardiopulmonary disease. Aortic Atherosclerosis (ICD10-I70.0). Electronically Signed   By: Charlett Nose M.D.   On: 11/30/2022 00:52   DG Chest 2 View  Result Date: 11/29/2022 CLINICAL DATA:  Chest pain. EXAM: CHEST - 2 VIEW COMPARISON:  Portable chest 05/22/2021 FINDINGS: The heart size and mediastinal contours are within normal limits. There is calcification in the transverse aorta. Both lungs are clear apart from mild chronic central bronchial thickening. The visualized skeletal structures are intact with thoracic spondylosis and degenerative disc disease. Cholecystectomy clips are noted on the lateral view. IMPRESSION: No active cardiopulmonary disease. Aortic atherosclerosis. Chronic central bronchial thickening. Electronically Signed   By: Almira Bar M.D.   On: 11/29/2022 22:14    Cardiac Studies    Patient Profile     62 y.o. female history of hypertension, hyperlipidemia, type 2 diabetes mellitus, CKD who was admitted 11/30/2022 for complaints of chest pain that occurred 1 time on Monday.    Patient has not been seen in about a year and has not been taking any of her medications.  Patient states that she has had viral symptoms of congestion, cough, nausea, diarrhea, chills for the past couple weeks.  She states Monday she had a one-time occurrence of chest pain that occurred while she was sitting down watching television.  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  She has not had any chest pain since.  She states she went to the emergency room because she has had a persistent headache and nausea.  Denies any family history of heart disease, but has several risk factors for coronary artery disease.  Has had no symptoms of palpitations, shortness of breath, decreased functional status, peripheral edema, orthopnea.  Assessment & Plan    Atypical chest pain  with minimally elevated troponins Patient presenting with one-time occurrence of chest pain Monday night (4 days ago) in the setting of viral symptoms.  Has minimally elevated trops of 24-32.  EKG not showing any acute ST-T wave changes.  Evaluation so far and current symptoms do not suggest ACS, likely this is related to her viral illness. Echocardiogram was not ordered last night however will be ordered today.  Suspect this to be normal however if abnormal will reevaluate.  CT did show some aortic atherosclerosis. Echocardiogram pending Can consider coronary CTA, if there is any concerning features on the echocardiogram, otherwise would recommend outpatient evaluation and consider the possibility of coronary calcium score versus CTA.  I do not think this chest pain is at all cardiac in nature.  Minimal troponin elevation probably related to ongoing condition and not ACS.  HTN Generally controlled apart from a  couple readings. BP soft now. Will continue to monitor BP before adding on PTA meds.   Hyperlipidemia Will check LDL.  Hypokalemia  Receiving supplementation. 3.4  Viral symptoms with mild leukocytosis Type 2 diabetes mellitus CKD Per primary    For questions or updates, please contact [facility] Please consult [email or web address] for contact info under        Signed, Abagail Kitchens, PA-C  11/30/2022, 9:31 AM       ATTENDING ATTESTATION  I have seen, examined and evaluated the patient this morning along with 11/30/2022.  After reviewing all the available data and chart, we discussed the patients laboratory, study & physical findings as well as symptoms in detail.  I agree with his findings, examination as well as impression recommendations as per our discussion.    Attending adjustments noted in italics.   Patient with a single episode of chest discomfort while canning tomato sauce and has not had any further symptoms.  She clearly has risk factors with untreated  diabetes and labile blood pressures.  She is also morbidly obese and probably has relatively poorly controlled hyperlipidemia although last documented LDL was 82 in 2021.  I agree with checking a 2D echo just because of the ongoing symptoms, but unless this shows any gross abnormalities, would probably hold off any further cardiac evaluation in the hospital.  Minimal troponin elevation in setting of nausea and vomiting and weakness.  Not concerning. I do not think that an inpatient Coronary CTA is warranted unless the echo is abnormal.  We will standby for results of echocardiogram in case there is any additional recommendations.    Marykay Lex, MD, MS Bryan Lemma, M.D., M.S. Interventional Cardiologist  [facility]  Pager # [phone] Phone # [phone] [address] [place], [place] [address]

## 2022-11-30 NOTE — TOC Initial Note (Signed)
Transition of Care [facility]) - Initial/Assessment Note    Patient Details  Name: [name] MRN: [id number] Date of Birth: [birth year]  Transition of Care [facility]) CM/SW Contact:    Gala Lewandowsky, RN Phone Number: 11/30/2022, 11:37 AM  Clinical Narrative: Case Manager received a consult for medication assistance; consult stated that patient did not have insurance, hence was out of meds for months per pt report. Case Manager verified that Rosann Auerbach is active; however, the deductible is $5500.00. Patient states her insurance became active this month. She is currently working and has a PCP with the [facility]. Patient states she has a glucose meter; however, she has not check her glucose due to the cost of the strips. Case Manager educated the patient on [employer] and cost at [employer]. Daughter was at the bedside and she stated that was what the patient had used in the past. Patient states with rising cost of food and everything else she did not have enough money to pay for medications or insulin supplies. Patient will benefit from all generics if possible. Case Manager educated the patient on outcomes of uncontrolled diabetes and what additional cormorbidities it can lead to. Case Manager asked Staff RN to place an order for Diabetes Educator to speak with the patient and Hemoglobin A1c has been ordered. Case Manager will continue to follow for additional transition of care needs as the patient progresses.                      Expected Discharge Plan: Home/Self Care Barriers to Discharge: No Barriers Identified   Patient Goals and CMS Choice Patient states their goals for this hospitalization and ongoing recovery are:: to return home.  Expected Discharge Plan and Services In-house Referral:  (Diabetes Educator.) Discharge Planning Services: CM Consult, Medication Assistance Post Acute Care Choice: NA Living arrangements for the past 2 months: Single Family Home                    DME Agency: NA       HH Arranged: NA     Prior Living Arrangements/Services Living arrangements for the past 2 months: Single Family Home Lives with:: Adult Children Patient language and need for interpreter reviewed:: Yes Do you feel safe going back to the place where you live?: Yes      Need for Family Participation in Patient Care: No (Comment) Care giver support system in place?: No (comment)   Criminal Activity/Legal Involvement Pertinent to Current Situation/Hospitalization: No - Comment as needed  Activities of Daily Living Home Assistive Devices/Equipment: None ADL Screening (condition at time of admission) Patient's cognitive ability adequate to safely complete daily activities?: Yes Is the patient deaf or have difficulty hearing?: No Does the patient have difficulty seeing, even when wearing glasses/contacts?: No Does the patient have difficulty concentrating, remembering, or making decisions?: No Patient able to express need for assistance with ADLs?: Yes Does the patient have difficulty dressing or bathing?: Yes Independently performs ADLs?: Yes (appropriate for developmental age) Does the patient have difficulty walking or climbing stairs?: No Weakness of Legs: None Weakness of Arms/Hands: None  Permission Sought/Granted Permission sought to share information with : Family Supports, Case Manager    Emotional Assessment Appearance:: Appears stated age Attitude/Demeanor/Rapport: Engaged Affect (typically observed): Appropriate Orientation: : Oriented to Self, Oriented to Place, Oriented to  Time, Oriented to Situation Alcohol / Substance Use: Not Applicable Psych Involvement: No (comment)  Admission diagnosis:  Viral illness [  B34.9] Chest pain [R07.9] Chest pain, unspecified type [R07.9] Patient Active Problem List   Diagnosis Date Noted   Chest pain 11/30/2022   Complicated UTI (urinary tract infection) 05/23/2021   Kidney stone 05/23/2021    Hydronephrosis due to obstruction of ureter 05/23/2021   Prolonged QT interval 05/23/2021   Angina pectoris (HCC) 08/26/2020   Angina at rest 08/26/2020   Microalbuminuria due to type 2 diabetes mellitus (HCC) 01/22/2020   Bacteremia due to Escherichia coli    Sepsis (HCC) 12/12/2019   Hip pain 08/10/2019   Healthcare maintenance 03/31/2015   History of CHF (congestive heart failure) 07/16/2013   COPD (chronic obstructive pulmonary disease) (HCC) 07/16/2013   Diabetes mellitus type 2 in obese 06/11/2013   Recurrent kidney stones 10/03/2012   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   Urinary tract infection 11/12/2011   Stage 2 chronic kidney disease 11/12/2011   Morbid obesity (HCC) 11/08/2011   Knee pain 03/19/2011   Type 2 diabetes mellitus with complication (HCC) 09/19/2010   Hyperlipidemia 09/19/2010   Bipolar affective disorder (HCC) 09/09/2006   Major depressive disorder, recurrent episode (HCC) 08/29/2006   TOBACCO DEPENDENCE 08/29/2006   HTN (hypertension) 08/29/2006   Chronic venous insufficiency 08/29/2006   PCP:  Tiffany Kocher, DO Pharmacy:   [facility] [id number] - [place], [place] - [address] [place] [address] Phone: [phone] Fax: [phone]  [facility] [address] [place] [address] Phone: [phone] Fax: [phone]   Social Determinants of Health (SDOH) Social History: SDOH Screenings   Food Insecurity: No Food Insecurity (11/30/2022)  Housing: Patient Declined (11/30/2022)  Transportation Needs: No Transportation Needs (11/30/2022)  Utilities: Patient Declined (11/30/2022)  Depression (PHQ2-9): High Risk (06/06/2021)  Tobacco Use: High Risk (11/29/2022)   Readmission Risk Interventions     No data to display

## 2022-11-30 NOTE — Hospital Course (Addendum)
[name] is a 62 y.o.female with a history of bipolar disorder, hypertension, diabetes mellitus 2 currently not on medications, tobacco abuse, dyslipidemia, obese with a BMI of 43, chronic kidney disease likely secondary to diabetic nephropathy who was admitted to the [facility] Teaching Service at [facility] for chest pain. Her hospital course is detailed below:  Chest Pain:  Atypical chest pain with echo showing *** and EKG unremarkable. Per cardiology, admitted for observation and further work up.    DM2 Started on *** LAI and SSI. Sent home on ***. Well controlled in hospital    Other chronic conditions were medically managed with home medications and formulary alternatives as necessary (obesity, HLD, HTN, CKD3a)  PCP Follow-up Recommendations:

## 2022-11-30 NOTE — Inpatient Diabetes Management (Signed)
Inpatient Diabetes Program Recommendations  AACE/ADA: New Consensus Statement on Inpatient Glycemic Control (2015)  Target Ranges:  Prepandial:   less than 140 mg/dL      Peak postprandial:   less than 180 mg/dL (1-2 hours)      Critically ill patients:  140 - 180 mg/dL   Lab Results  Component Value Date   GLUCAP 163 (H) 11/30/2022   HGBA1C 5.7 (H) 05/23/2021    Review of Glycemic Control  Latest Reference Range & Units 11/30/22 06:55 11/30/22 11:55  Glucose-Capillary 70 - 99 mg/dL 409 (H) 811 (H)   Diabetes history: DM 2 Outpatient Diabetes medications: Tresiba and Lantus insulins last taken >1 year ago Current orders for Inpatient glycemic control:  Novolog 0-9 units tid + hs  Glucose trends controlled currently on Novolog Correction scale (may potentially only need oral agents at time of d/c)  Spoke with pt over the phone regarding discharge planning and taking medication at time of discharge. Pt had financial hardship and was unable to get medications (the Lantus and Tresiba pens at full price) and DM supplies. Reviewed [employer] supplies. Pt has ReliOn meter already. Pt will need to purchase supplies for the meter. Pt has been on [employer] insulin in the past. I reviewed that [facility] would always be a back up resource for her. Pt states the vial and syringe would be more affordable with $25/vial. Pt maybe able to fill out more paperwork to get her medications cheaper based off of financial need in one of our clinics. Pt reports following a DM diet and said has had some weight changes within the last year.  Thanks,  Christena Deem RN, MSN, BC-ADM Inpatient Diabetes Coordinator Team Pager [phone] (8a-5p)

## 2022-12-01 DIAGNOSIS — B349 Viral infection, unspecified: Secondary | ICD-10-CM

## 2022-12-01 DIAGNOSIS — R079 Chest pain, unspecified: Secondary | ICD-10-CM

## 2022-12-01 DIAGNOSIS — I10 Essential (primary) hypertension: Secondary | ICD-10-CM

## 2022-12-01 DIAGNOSIS — R0789 Other chest pain: Secondary | ICD-10-CM | POA: Diagnosis not present

## 2022-12-01 LAB — HEMOGLOBIN A1C
Hgb A1c MFr Bld: 7.5 % — ABNORMAL HIGH (ref 4.8–5.6)
Mean Plasma Glucose: 169 mg/dL

## 2022-12-01 LAB — GLUCOSE, CAPILLARY: Glucose-Capillary: 156 mg/dL — ABNORMAL HIGH (ref 70–99)

## 2022-12-01 LAB — MICROALBUMIN, URINE: Microalb, Ur: 36.5 ug/mL — ABNORMAL HIGH

## 2022-12-01 LAB — URINE CULTURE

## 2022-12-01 MED ORDER — ROSUVASTATIN CALCIUM 20 MG PO TABS
20.0000 mg | ORAL_TABLET | Freq: Every day | ORAL | 0 refills | Status: DC
Start: 2022-12-01 — End: 2022-12-27

## 2022-12-01 MED ORDER — CEPHALEXIN 500 MG PO CAPS: 500.0000 mg | ORAL_CAPSULE | Freq: Two times a day (BID) | ORAL | 0 refills | Status: AC

## 2022-12-01 MED ORDER — LOSARTAN POTASSIUM 25 MG PO TABS
25.0000 mg | ORAL_TABLET | Freq: Every day | ORAL | 0 refills | Status: DC
Start: 2022-12-01 — End: 2022-12-27

## 2022-12-01 MED ORDER — NICOTINE 14 MG/24HR TD PT24: 14.0000 mg | MEDICATED_PATCH | Freq: Every day | TRANSDERMAL | 0 refills | Status: DC

## 2022-12-01 NOTE — Discharge Instructions (Addendum)
Dear [name],  Thank you for letting us participate in your care. You were hospitalized for chest pain and diagnosed with chest pain likely due to a viral illness. You were also diagnosed with a UTI. You were treated with IV fluids and antibiotics with improvement.  POST-HOSPITAL & CARE INSTRUCTIONS Continue taking Keflex for 4 days to finish out antibiotics for your UTI. We are awaiting your urine culture results, and if these show something that we need another antibiotic for, we will call you. Be sure to pick up your losartan 25 mg daily and rosuvastatin 20 mg daily. I have also sent in nicotine patches if you would like them. We will hold off on metformin or insulin for your diabetes at this time given your sugars are not very high. Go to your follow up appointments (listed below). Dr. Claudean Severance can help you control your blood pressure, blood sugar, and cholesterol. The cardiologist will contact you to make an appointment.  DOCTOR'S APPOINTMENT   No future appointments.  Follow-up Information     Holly Kocher, DO. Schedule an appointment as soon as possible for a visit.   Specialty: Family Medicine Why: Make an appointment for hospital follow up ASAP. Contact information: [address] [place] [address] [phone]                Take care and be well!  Family Medicine Teaching Service Inpatient Team [place]  [facility]  [address] [place], [place] [address] [phone]

## 2022-12-01 NOTE — Discharge Summary (Addendum)
Family Medicine Teaching [facility] Discharge Summary  Patient name: [name] Medical record number: [id number] Date of birth: March 19, 1961 Age: 62 y.o. Gender: female Date of Admission: 11/29/2022  Date of Discharge: 12/01/22 Admitting Physician: Kathlen Mody, MD  Primary Care Provider: Tiffany Kocher, DO Consultants: Cardiology  Indication for Hospitalization: Chest pain requiring further workup  Brief Hospital Course:  [name] is a 62 y.o.female with a history of bipolar disorder, hypertension, diabetes mellitus 2 currently not on medications, tobacco abuse, dyslipidemia, obese with a BMI of 43, chronic kidney disease likely secondary to diabetic nephropathy who was admitted to the [facility] Teaching Service at [facility] for chest pain. Her hospital course is detailed below:  Chest Pain Atypical chest pain with unremarkable EKG. Trops minimally elevated 24-32. Endorsed diarrhea on admission. Per cardiology, likely not ACS but admitted for observation and further work up. Echo showing LVEF 60-65% with G1DD. Likely 2/2 gastric viral illness with dehydration. Recommended outpatient evaluation with possible coronary calcium score vs CTA given one-time nature of symptoms and unremarkable workup.  UTI Endorsed dysuria on admission. UA with large leukocytes, small hemoglobin, and 30 protein. Urine culture collected and pending at discharge. One dose rocephin given. Discharged with 4 doses of keflex to complete 5 day course.  HTN BP soft on admission. Previously on losartan 50 mg daily at home, though she did have financial difficulties obtaining this. SBP slowly increased over admission. Losartan 25 mg daily restarted at discharge.  DM2 Started on SSI in hospital. A1c 7.5. Required 4u aspart in 24 hours before discharge. Given she has not been on insulin before admission with acceptable glucose control on minimal SSI, she was discharged without medications and advised to follow  up with PCP for further management.  HLD Previously on rosuvastatin 10 mg daily though has not been able to take given financial concerns. Repeat lipid panel elevated; ASCVD risk 34.8%. She was discharged with rosuvastatin 20 mg daily.  Other chronic conditions were medically managed with home medications and formulary alternatives as necessary (obesity, CKD3a).  PCP Follow-up Recommendations: Ensure follow up with cardiology for chest pain Assess BP control on lower dose losartan Consider starting metformin - we did not do this on discharge due to her recent GI illness. Assess tolerance of high intensity rosuvastatin  Discharge Diagnoses/Problem List:  Principal Problem for Admission: chest pain Other Problems addressed during stay:  Present on Admission:  Chest pain  Atypical chest pain  Essential hypertension  Disposition: Home  Discharge Condition: Stable  Discharge Exam:  Blood pressure (!) 162/61, pulse 88, temperature 98.1 F (36.7 C), temperature source Oral, resp. rate 19, height 5\' 4"  (1.626 m), weight 113.7 kg, SpO2 98 %.  General: Alert and oriented, in NAD Skin: Warm, dry, and intact HEENT: NCAT, EOM grossly normal, midline nasal septum Cardiac: RRR, no m/r/g appreciated Respiratory: CTAB, breathing and speaking comfortably on RA Abdominal: Soft, nontender, nondistended, normoactive bowel sounds Extremities: Moves all extremities grossly equally Neurological: No gross focal deficit Psychiatric: Appropriate mood and affect   Significant Labs and Imaging:  Recent Labs  Lab 11/29/22 2119 11/30/22 1210  WBC 13.1* 9.5  HGB 15.6* 14.2  HCT 46.1* 42.7  PLT 320 281   Recent Labs  Lab 11/29/22 2119 11/29/22 2338 11/30/22 0954  NA 138  --  133*  K 3.4*  --  3.7  CL 101  --  103  CO2 23  --  20*  GLUCOSE 223*  --  139*  BUN 15  --  23*  CREATININE 1.23*  --  1.22*  CALCIUM 9.7  --  8.8*  MG  --  2.0  --    CXR IMPRESSION: No active cardiopulmonary  disease. Aortic atherosclerosis. Chronic central bronchial thickening.  CTA PE IMPRESSION: No evidence of pulmonary embolus. No acute cardiopulmonary disease. Aortic Atherosclerosis (ICD10-I70.0).  Results/Tests Pending at Time of Discharge: urine culture, urine microalb  Discharge Medications:  Allergies as of 12/01/2022       Reactions   Tamsulosin Shortness Of Breath        Medication List     STOP taking these medications    insulin glargine 100 UNIT/ML Solostar Pen Commonly known as: LANTUS   Novofine Pen Needle 32G X 6 MM Misc Generic drug: Insulin Pen Needle       TAKE these medications    acetaminophen 500 MG tablet Commonly known as: TYLENOL Take 1,000 mg by mouth every 6 (six) hours as needed for mild pain.   aspirin EC 81 MG tablet Take 81 mg by mouth every morning.   cephALEXin 500 MG capsule Commonly known as: KEFLEX Take 1 capsule (500 mg total) by mouth 2 (two) times daily for 4 days.   losartan 25 MG tablet Commonly known as: COZAAR Take 1 tablet (25 mg total) by mouth daily. What changed:  medication strength how much to take   multivitamin with minerals Tabs tablet Take 1 tablet by mouth every morning.   nicotine 14 mg/24hr patch Commonly known as: NICODERM CQ - dosed in mg/24 hours Place 1 patch (14 mg total) onto the skin daily. Start taking on: December 02, 2022   nitroGLYCERIN 0.4 MG SL tablet Commonly known as: Nitrostat Place 1 tablet (0.4 mg total) under the tongue every 5 (five) minutes as needed for chest pain. If after 1 tablet and 5 minutes the chest pain is still present, go to emergency room.   rosuvastatin 20 MG tablet Commonly known as: CRESTOR Take 1 tablet (20 mg total) by mouth daily. What changed:  medication strength how much to take       Discharge Instructions: Please refer to Patient Instructions section of EMR for full details.  Patient was counseled important signs and symptoms that should prompt return to  medical care, changes in medications, dietary instructions, activity restrictions, and follow up appointments.   Follow-Up Appointments:  Follow-up Information     Tiffany Kocher, DO. Schedule an appointment as soon as possible for a visit.   Specialty: Family Medicine Why: Make an appointment for hospital follow up ASAP. Contact information: [address] [place] [address] [phone]                Janeal Holmes, MD 12/01/2022, 11:36 AM PGY-1, [facility] Family Medicine

## 2022-12-01 NOTE — Progress Notes (Signed)
Rounding Note    Patient Name: [name] Date of Encounter: 12/01/2022  [facility] Cardiologist: None   Subjective   BP 162/61. SpO2 98% on RA.  Denies any chest pain or dyspnea  Inpatient Medications    Scheduled Meds:  insulin aspart  0-5 Units Subcutaneous QHS   insulin aspart  0-9 Units Subcutaneous TID WC   nicotine  14 mg Transdermal Daily   Continuous Infusions:  sodium chloride 100 mL/hr at 12/01/22 0811   cefTRIAXone (ROCEPHIN)  IV 1 g (11/30/22 1441)   PRN Meds: acetaminophen **OR** acetaminophen   Vital Signs    Vitals:   11/30/22 2023 12/01/22 0449 12/01/22 0652 12/01/22 0730  BP: (!) 165/79 (!) 157/51  (!) 162/61  Pulse: 72 76  88  Resp: 16 18  19   Temp: 97.9 F (36.6 C) 98.1 F (36.7 C)    TempSrc: Oral Oral    SpO2: 96% 96%  98%  Weight:   113.7 kg   Height:        Intake/Output Summary (Last 24 hours) at 12/01/2022 0924 Last data filed at 12/01/2022 0811 Gross per 24 hour  Intake 1024.16 ml  Output 200 ml  Net 824.16 ml      12/01/2022    6:52 AM 11/30/2022    6:31 AM 11/29/2022    9:14 PM  Last 3 Weights  Weight (lbs) 250 lb 9.6 oz 252 lb 13.9 oz 260 lb  Weight (kg) 113.671 kg 114.7 kg 117.935 kg      Telemetry    Normal sinus rhythm heart rates in the 70s.- Personally Reviewed  ECG    Sinus tachycardia, heart rate 106.  No acute ST-T wave changes. - Personally Reviewed  Physical Exam   GEN: No acute distress.  Morbidly obese Neck: No JVD Cardiac: RRR, no murmurs, rubs, or gallops.  Distant heart sounds Respiratory: Clear to auscultation bilaterally. GI: Soft, nontender, non-distended  MS: No edema; No deformity. Neuro:  Nonfocal  Psych: Normal affect   Labs    High Sensitivity Troponin:   Recent Labs  Lab 11/29/22 2119 11/29/22 2338  TROPONINIHS 24* 32*      Chemistry Recent Labs  Lab 11/29/22 2119 11/29/22 2338 11/30/22 0954  NA 138  --  133*  K 3.4*  --  3.7  CL 101  --  103  CO2 23   --  20*  GLUCOSE 223*  --  139*  BUN 15  --  23*  CREATININE 1.23*  --  1.22*  CALCIUM 9.7  --  8.8*  MG  --  2.0  --   GFRNONAA 50*  --  51*  ANIONGAP 14  --  10     Lipids  Recent Labs  Lab 11/30/22 0954  CHOL 200  TRIG 304*  HDL 33*  LDLCALC 106*  CHOLHDL 6.1    Hematology Recent Labs  Lab 11/29/22 2119 11/30/22 1210  WBC 13.1* 9.5  RBC 5.13* 4.69  HGB 15.6* 14.2  HCT 46.1* 42.7  MCV 89.9 91.0  MCH 30.4 30.3  MCHC 33.8 33.3  RDW 13.0 13.2  PLT 320 281    Thyroid No results for input(s): "TSH", "FREET4" in the last 168 hours.  BNPNo results for input(s): "BNP", "PROBNP" in the last 168 hours.  DDimer No results for input(s): "DDIMER" in the last 168 hours.   Radiology    ECHOCARDIOGRAM COMPLETE  Result Date: 11/30/2022    ECHOCARDIOGRAM REPORT   Patient Name:  [name] Date of Exam: 11/30/2022 Medical Rec #:  [id number]           Height:       64.0 in Accession #:    [id number]          Weight:       252.9 lb Date of Birth:  August 25, 1960          BSA:          2.161 m Patient Age:    61 years            BP:           157/62 mmHg Patient Gender: F                   HR:           74 bpm. Exam Location:  Inpatient Procedure: 2D Echo, Cardiac Doppler and Color Doppler Indications:    Chest pain  History:        Patient has prior history of Echocardiogram examinations, most                 recent 08/26/2020. CHF, COPD, Signs/Symptoms:Chest Pain; Risk                 Factors:Current Smoker, Diabetes, Hypertension and HLD.  Sonographer:    Lucy Antigua Referring Phys: [id number] SHENG L HALEY IMPRESSIONS  1. Left ventricular ejection fraction, by estimation, is 60 to 65%. The left ventricle has normal function. The left ventricle has no regional wall motion abnormalities. Left ventricular diastolic parameters are consistent with Grade I diastolic dysfunction (impaired relaxation).  2. Right ventricular systolic function is normal. The right ventricular size is normal.  There is normal pulmonary artery systolic pressure.  3. The mitral valve is normal in structure. Trivial mitral valve regurgitation. No evidence of mitral stenosis.  4. The aortic valve is tricuspid. There is mild calcification of the aortic valve. Aortic valve regurgitation is not visualized. No aortic stenosis is present.  5. The inferior vena cava is normal in size with greater than 50% respiratory variability, suggesting right atrial pressure of 3 mmHg. Conclusion(s)/Recommendation(s): Technically limited study due to poor sound wave transmission. FINDINGS  Left Ventricle: Left ventricular ejection fraction, by estimation, is 60 to 65%. The left ventricle has normal function. The left ventricle has no regional wall motion abnormalities. The left ventricular internal cavity size was normal in size. There is  no left ventricular hypertrophy. Left ventricular diastolic parameters are consistent with Grade I diastolic dysfunction (impaired relaxation). Right Ventricle: The right ventricular size is normal. No increase in right ventricular wall thickness. Right ventricular systolic function is normal. There is normal pulmonary artery systolic pressure. The tricuspid regurgitant velocity is 2.40 m/s, and  with an assumed right atrial pressure of 3 mmHg, the estimated right ventricular systolic pressure is 26.0 mmHg. Left Atrium: Left atrial size was normal in size. Right Atrium: Right atrial size was normal in size. Pericardium: There is no evidence of pericardial effusion. Mitral Valve: The mitral valve is normal in structure. There is mild calcification of the mitral valve leaflet(s). Trivial mitral valve regurgitation. No evidence of mitral valve stenosis. Tricuspid Valve: The tricuspid valve is normal in structure. Tricuspid valve regurgitation is trivial. No evidence of tricuspid stenosis. Aortic Valve: The aortic valve is tricuspid. There is mild calcification of the aortic valve. Aortic valve regurgitation is  not visualized. No aortic stenosis is present. Aortic valve mean gradient measures 5.0 mmHg.  Aortic valve peak gradient measures 7.4 mmHg. Aortic valve area, by VTI measures 2.37 cm. Pulmonic Valve: The pulmonic valve was grossly normal. Pulmonic valve regurgitation is not visualized. No evidence of pulmonic stenosis. Aorta: The aortic root is normal in size and structure. Venous: The inferior vena cava is normal in size with greater than 50% respiratory variability, suggesting right atrial pressure of 3 mmHg. IAS/Shunts: No atrial level shunt detected by color flow Doppler.  LEFT VENTRICLE PLAX 2D LVIDd:         3.20 cm     Diastology LVIDs:         2.20 cm     LV e' medial:    5.44 cm/s LV PW:         1.35 cm     LV E/e' medial:  23.9 LV IVS:        1.00 cm     LV e' lateral:   5.77 cm/s LVOT diam:     1.80 cm     LV E/e' lateral: 22.5 LV SV:         78 LV SV Index:   36 LVOT Area:     2.54 cm  LV Volumes (MOD) LV vol d, MOD A4C: 49.8 ml LV vol s, MOD A4C: 13.1 ml LV SV MOD A4C:     49.8 ml RIGHT VENTRICLE RV S prime:     11.60 cm/s TAPSE (M-mode): 2.1 cm LEFT ATRIUM             Index        RIGHT ATRIUM           Index LA Vol (A2C):   56.5 ml 26.14 ml/m  RA Area:     15.00 cm LA Vol (A4C):   46.3 ml 21.42 ml/m  RA Volume:   36.50 ml  16.89 ml/m LA Biplane Vol: 51.1 ml 23.64 ml/m  AORTIC VALVE AV Area (Vmax):    2.25 cm AV Area (Vmean):   2.19 cm AV Area (VTI):     2.37 cm AV Vmax:           136.00 cm/s AV Vmean:          106.000 cm/s AV VTI:            0.330 m AV Peak Grad:      7.4 mmHg AV Mean Grad:      5.0 mmHg LVOT Vmax:         120.00 cm/s LVOT Vmean:        91.100 cm/s LVOT VTI:          0.307 m LVOT/AV VTI ratio: 0.93  AORTA Ao Root diam: 2.50 cm Ao Asc diam:  3.00 cm MITRAL VALVE                TRICUSPID VALVE MV Area (PHT): 2.62 cm     TR Peak grad:   23.0 mmHg MV Decel Time: 289 msec     TR Vmax:        240.00 cm/s MV E velocity: 130.00 cm/s MV A velocity: 162.00 cm/s  SHUNTS MV E/A ratio:   0.80         Systemic VTI:  0.31 m                             Systemic Diam: 1.80 cm Arvilla Meres MD Electronically signed by Arvilla Meres MD Signature Date/Time: 11/30/2022/6:17:11 PM  Final    CT Angio Chest PE W and/or Wo Contrast  Result Date: 11/30/2022 CLINICAL DATA:  Dyspnea, chronic, chest wall or pleura disease suspected. Left side chest pain EXAM: CT ANGIOGRAPHY CHEST WITH CONTRAST TECHNIQUE: Multidetector CT imaging of the chest was performed using the standard protocol during bolus administration of intravenous contrast. Multiplanar CT image reconstructions and MIPs were obtained to evaluate the vascular anatomy. RADIATION DOSE REDUCTION: This exam was performed according to the departmental dose-optimization program which includes automated exposure control, adjustment of the mA and/or kV according to patient size and/or use of iterative reconstruction technique. CONTRAST:  75mL OMNIPAQUE IOHEXOL 350 MG/ML SOLN COMPARISON:  Chest x-ray 2 9 FINDINGS: Cardiovascular: No filling defects in the pulmonary arteries to suggest pulmonary emboli. Heart is normal size. Aorta is normal caliber. Coronary aortic calcifications. Mediastinum/Nodes: No mediastinal, hilar, or axillary adenopathy. Trachea and esophagus are unremarkable. Thyroid unremarkable. Lungs/Pleura: No confluent airspace opacities or effusions. Upper Abdomen: No acute findings Musculoskeletal: Chest wall soft tissues are unremarkable. No acute bony abnormality. Review of the MIP images confirms the above findings. IMPRESSION: No evidence of pulmonary embolus. No acute cardiopulmonary disease. Aortic Atherosclerosis (ICD10-I70.0). Electronically Signed   By: Charlett Nose M.D.   On: 11/30/2022 00:52   DG Chest 2 View  Result Date: 11/29/2022 CLINICAL DATA:  Chest pain. EXAM: CHEST - 2 VIEW COMPARISON:  Portable chest 05/22/2021 FINDINGS: The heart size and mediastinal contours are within normal limits. There is calcification in the  transverse aorta. Both lungs are clear apart from mild chronic central bronchial thickening. The visualized skeletal structures are intact with thoracic spondylosis and degenerative disc disease. Cholecystectomy clips are noted on the lateral view. IMPRESSION: No active cardiopulmonary disease. Aortic atherosclerosis. Chronic central bronchial thickening. Electronically Signed   By: Almira Bar M.D.   On: 11/29/2022 22:14    Cardiac Studies    Patient Profile     62 y.o. female history of hypertension, hyperlipidemia, type 2 diabetes mellitus, CKD who was admitted 11/30/2022 for complaints of chest pain that occurred 1 time on Monday.    Patient has not been seen in about a year and has not been taking any of her medications.  Patient states that she has had viral symptoms of congestion, cough, nausea, diarrhea, chills for the past couple weeks.  She states Monday she had a one-time occurrence of chest pain that occurred while she was sitting down watching television.  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  She has not had any chest pain since.  She states she went to the emergency room because she has had a persistent headache and nausea.  Denies any family history of heart disease, but has several risk factors for coronary artery disease.  Has had no symptoms of palpitations, shortness of breath, decreased functional status, peripheral edema, orthopnea.  Assessment & Plan    Atypical chest pain with minimally elevated troponins Patient presenting with one-time occurrence of chest pain Monday night (4 days ago) in the setting of viral symptoms.  Has minimally elevated trops of 24-32.  EKG not showing any acute ST-T wave changes.  Evaluation so far and current symptoms do not suggest ACS, likely this is related to her viral illness. Echocardiogram shows EF 60 to 65%, normal RV function, no  significant valvular disease.    CTPA shows minimal coronary calcifications. Recommend outpatient evaluation, can consider coronary  CTA as outpatient.  I do not think this chest pain is cardiac in nature.  Minimal troponin elevation probably related to ongoing condition and not ACS.  HTN Continue home meds  Hyperlipidemia LDL 106  Hypokalemia  Received supplementation.   Viral symptoms with mild leukocytosis Type 2 diabetes mellitus CKD Per primary   [facility] will sign off.   Medication Recommendations: Continue home meds Other recommendations (labs, testing, etc): None Follow up as an outpatient: We will schedule    For questions or updates, please contact [facility] Please consult [email or web address] for contact info under        Signed, Little Ishikawa, MD  12/01/2022, 9:24 AM

## 2022-12-02 LAB — URINE CULTURE

## 2022-12-03 ENCOUNTER — Telehealth: Payer: Self-pay

## 2022-12-03 LAB — URINE CULTURE: Culture: >=100000 — AB

## 2022-12-03 NOTE — Transitions of Care (Post Inpatient/ED Visit) (Signed)
   12/03/2022  Name: ARACELIA EMSHOFF MRN: [id number] DOB: [birth year]  Today's TOC FU Call Status: Today's TOC FU Call Status:: Successful TOC FU Call Competed TOC FU Call Complete Date: 12/03/22  Transition Care Management Follow-up Telephone Call Date of Discharge: 11/29/22 Discharge Facility: [facility] Type of Discharge: Inpatient Admission Primary Inpatient Discharge Diagnosis:: chest pain Reason for ED Visit: Other:, Cardiac Conditions How have you been since you were released from the hospital?: Better Any questions or concerns?: No  Items Reviewed: Did you receive and understand the discharge instructions provided?: Yes Medications obtained,verified, and reconciled?: Yes (Medications Reviewed) Any new allergies since your discharge?: No Dietary orders reviewed?: No Do you have support at home?: Yes People in Home: child(ren), adult  Medications Reviewed Today: Medications Reviewed Today     Reviewed by Annabell Sabal, CMA (Certified Medical Assistant) on 12/03/22 at 1042  Med List Status: <None>   Medication Order Taking? Sig Documenting Provider Last Dose Status Informant  acetaminophen (TYLENOL) 500 MG tablet [id number] Yes Take 1,000 mg by mouth every 6 (six) hours as needed for mild pain. [provider] Taking Active Self  aspirin 81 MG EC tablet [id number] Yes Take 81 mg by mouth every morning. [provider] Taking Active Self  cephALEXin (KEFLEX) 500 MG capsule [id number] Yes Take 1 capsule (500 mg total) by mouth 2 (two) times daily for 4 days. Evette Georges, MD Taking Active   losartan (COZAAR) 25 MG tablet [id number] Yes Take 1 tablet (25 mg total) by mouth daily. Evette Georges, MD Taking Active   Multiple Vitamin (MULTIVITAMIN WITH MINERALS) TABS [id number] Yes Take 1 tablet by mouth every morning. [provider] Taking Active Self  nicotine (NICODERM CQ - DOSED IN MG/24 HOURS) 14 mg/24hr patch [id number] Yes Place 1 patch (14 mg  total) onto the skin daily. Evette Georges, MD Taking Active   nitroGLYCERIN (NITROSTAT) 0.4 MG SL tablet [id number] Yes Place 1 tablet (0.4 mg total) under the tongue every 5 (five) minutes as needed for chest pain. If after 1 tablet and 5 minutes the chest pain is still present, go to emergency room. Shirlean Mylar, MD Taking Active Self           Med Note Epimenio Sarin, Marcy Siren   Fri Nov 30, 2022  1:29 PM)    rosuvastatin (CRESTOR) 20 MG tablet [id number] Yes Take 1 tablet (20 mg total) by mouth daily. Evette Georges, MD Taking Active             Home Care and Equipment/Supplies: Were Home Health Services Ordered?: No Any new equipment or medical supplies ordered?: No  Functional Questionnaire: Do you need assistance with bathing/showering or dressing?: No Do you need assistance with meal preparation?: No Do you need assistance with eating?: No Do you have difficulty maintaining continence: No Do you need assistance with getting out of bed/getting out of a chair/moving?: No Do you have difficulty managing or taking your medications?: No  Follow up appointments reviewed: PCP Follow-up appointment confirmed?: NA Specialist Hospital Follow-up appointment confirmed?: No Reason Specialist Follow-Up Not Confirmed: Patient has Specialist Provider Number and will Call for Appointment Do you need transportation to your follow-up appointment?: No Do you understand care options if your condition(s) worsen?: Yes-patient verbalized understanding    SIGNATURE Fredirick Maudlin

## 2022-12-17 ENCOUNTER — Encounter: Payer: Self-pay | Admitting: Cardiology

## 2022-12-17 ENCOUNTER — Telehealth: Payer: Self-pay | Admitting: Cardiology

## 2022-12-17 NOTE — Telephone Encounter (Signed)
-----   Message from Little Ishikawa, MD sent at 12/01/2022  9:33 AM EDT ----- Regarding: Appointment Can we schedule this patient a hospital f/u appointment with Dr Herbie Baltimore or APP in 2 weeks or so? Thanks, Thayer Ohm

## 2022-12-17 NOTE — Telephone Encounter (Signed)
LVM 3x with no success in scheduling, will send reminder letter.

## 2022-12-27 ENCOUNTER — Encounter: Payer: Self-pay | Admitting: Student

## 2022-12-27 ENCOUNTER — Ambulatory Visit: Payer: Commercial Managed Care - HMO | Admitting: Student

## 2022-12-27 VITALS — BP 137/72 | HR 84 | Ht 64.0 in | Wt 251.0 lb

## 2022-12-27 DIAGNOSIS — R809 Proteinuria, unspecified: Secondary | ICD-10-CM

## 2022-12-27 DIAGNOSIS — E1129 Type 2 diabetes mellitus with other diabetic kidney complication: Secondary | ICD-10-CM | POA: Diagnosis not present

## 2022-12-27 DIAGNOSIS — I10 Essential (primary) hypertension: Secondary | ICD-10-CM | POA: Diagnosis not present

## 2022-12-27 DIAGNOSIS — Z794 Long term (current) use of insulin: Secondary | ICD-10-CM

## 2022-12-27 DIAGNOSIS — R0789 Other chest pain: Secondary | ICD-10-CM

## 2022-12-27 DIAGNOSIS — F319 Bipolar disorder, unspecified: Secondary | ICD-10-CM | POA: Diagnosis not present

## 2022-12-27 DIAGNOSIS — E0865 Diabetes mellitus due to underlying condition with hyperglycemia: Secondary | ICD-10-CM

## 2022-12-27 MED ORDER — LOSARTAN POTASSIUM 25 MG PO TABS
25.0000 mg | ORAL_TABLET | Freq: Every day | ORAL | 0 refills | Status: DC
Start: 2022-12-27 — End: 2023-01-01

## 2022-12-27 MED ORDER — ROSUVASTATIN CALCIUM 20 MG PO TABS
20.0000 mg | ORAL_TABLET | Freq: Every day | ORAL | 0 refills | Status: DC
Start: 2022-12-27 — End: 2023-01-01

## 2022-12-27 MED ORDER — METFORMIN HCL ER 500 MG PO TB24
500.0000 mg | ORAL_TABLET | Freq: Two times a day (BID) | ORAL | 3 refills | Status: AC
Start: 2022-12-27 — End: ?

## 2022-12-27 MED ORDER — QUETIAPINE FUMARATE 50 MG PO TABS
50.0000 mg | ORAL_TABLET | Freq: Every day | ORAL | 0 refills | Status: AC
Start: 2022-12-27 — End: ?

## 2022-12-27 MED ORDER — QUETIAPINE FUMARATE ER 300 MG PO TB24
300.0000 mg | ORAL_TABLET | Freq: Every day | ORAL | 0 refills | Status: DC
Start: 2022-12-27 — End: 2022-12-27

## 2022-12-27 NOTE — Assessment & Plan Note (Signed)
A1c 7.5.  Plan to recheck in August.  -Start metformin 500 mg morning and night with meal - Continue rosuvastatin

## 2022-12-27 NOTE — Patient Instructions (Addendum)
It was great to see you! Thank you for allowing me to participate in your care!   I recommend that you always bring your medications to each appointment as this makes it easy to ensure we are on the correct medications and helps [place] not miss when refills are needed.  Our plans for today:  - Take 500 mg of metformin in the morning and 500 mg at night. - Take 50 mg of Seroquel at night to help with mood and sleep. - Follow-up in 2 weeks  Take care and seek immediate care sooner if you develop any concerns. Please remember to show up 15 minutes before your scheduled appointment time!  Tiffany Kocher, DO [facility]

## 2022-12-27 NOTE — Assessment & Plan Note (Signed)
Diagnosed in 2005.  Unclear how long patient has been off maintenance medications.  Offered psychiatry resources, patient politely declined.  Patient agreeable to restarting Seroquel.  Close follow-up scheduled, no suicidal nor homicidal ideations today. - Start 50 mg Seroquel XR nightly - Follow-up scheduled for 2 weeks

## 2022-12-27 NOTE — Assessment & Plan Note (Signed)
Resolved.  Recommend she follow-up with cardiology as planned.  Number for heart care provided.

## 2022-12-27 NOTE — Assessment & Plan Note (Signed)
Well-controlled. - Continue 25 mg losartan - Recheck at next visit

## 2022-12-27 NOTE — Progress Notes (Signed)
    SUBJECTIVE:   CHIEF COMPLAINT / HPI:   Hospital Follow-up:  Chest pain: Has not had chest pain since her admission. No change in activity, no SOB.  Admitted for atypical chest pain, had unremarkable workup.  Patient did not follow-up with cardiology as recommended.Patient accidentally missed cardiology calls. Phone number given for heart care, written by provider by hand on AVS. Recommended she call and make appointment.  HTN Hx of HTN, however soft pressures during admission and losartan was decreased to 25 mg. Continue Losartan at 25 mg.  Diabetes A1c 7.5. Unable to afford GLP-1. Previously on metformin. Plan to restart today.  Smoking 3/4 packs a day. Smokes for stress relief. Not planning on quitting. Counseled.   Bipolar disorder Patient reports depressed mood, and history of bipolar disorder.  She is not currently on medication.  Does not believe she has seen psychiatry in the past.  Was once on lithium, however had toxicity and discontinued.  Reports taking Seroquel prior, tolerated well however was unable to afford medication-however appears to be many years ago.  She is agreeable to trying new medication today, and following up closely.  No suicidal nor homicidal ideations in office today.   OBJECTIVE:   BP 137/72   Pulse 84   Ht 5\' 4"  (1.626 m)   Wt 251 lb (113.9 kg)   SpO2 99%   BMI 43.08 kg/m    General: NAD, pleasant Cardio: RRR, no MRG. Cap Refill <2s. Respiratory: CTAB, normal wob on RA Skin: Warm and dry  ASSESSMENT/PLAN:   Essential hypertension Well-controlled. - Continue 25 mg losartan - Recheck at next visit  Atypical chest pain Resolved.  Recommend she follow-up with cardiology as planned.  Number for heart care provided.  Diabetes mellitus due to underlying condition, uncontrolled, with hyperglycemia, with long-term current use of insulin (HCC) A1c 7.5.  Plan to recheck in August.  -Start metformin 500 mg morning and night with meal -  Continue rosuvastatin  Bipolar affective disorder (HCC) Diagnosed in 2005.  Unclear how long patient has been off maintenance medications.  Offered psychiatry resources, patient politely declined.  Patient agreeable to restarting Seroquel.  Close follow-up scheduled, no suicidal nor homicidal ideations today. - Start 50 mg Seroquel XR nightly - Follow-up scheduled for 2 weeks   Smoking -Counseled on cessation and resources  Follow-up recommendations Reviewed patient's mental health history in depth.  Assess patient's willingness to engage in behavioral health resources. Schedule diabetes follow-up for August Recheck blood pressure Schedule appointment to discuss health maintenance gaps  Tiffany Kocher, DO [facility]

## 2022-12-28 ENCOUNTER — Telehealth: Payer: Self-pay | Admitting: Student

## 2022-12-28 DIAGNOSIS — Z5941 Food insecurity: Secondary | ICD-10-CM

## 2022-12-28 NOTE — Telephone Encounter (Signed)
SDOH Screener Reviewed social determinants of health screener which showed concern for food insecurity. This was addressed and the patient referred to Management Medicaid/SDOH resources through [facility] [phone]) . Plan to offer food box at next visit.

## 2022-12-30 ENCOUNTER — Other Ambulatory Visit: Payer: Self-pay | Admitting: Student

## 2022-12-30 DIAGNOSIS — E0865 Diabetes mellitus due to underlying condition with hyperglycemia: Secondary | ICD-10-CM

## 2022-12-30 DIAGNOSIS — E1129 Type 2 diabetes mellitus with other diabetic kidney complication: Secondary | ICD-10-CM

## 2023-01-08 ENCOUNTER — Other Ambulatory Visit: Payer: Commercial Managed Care - HMO

## 2023-01-08 NOTE — Patient Instructions (Signed)
Visit Information  The Patient                                              was given information about Medicaid Managed Care team care coordination services and consented to engagement with the Medicaid Managed Care team.   The  Patient                                              has been provided with contact information for the Managed Medicaid care management team and has been advised to call with any health related questions or concerns.   Sarha Bartelt, BSW, MHA [place]  Managed Medicaid Social Worker [phone]    

## 2023-01-08 NOTE — Patient Outreach (Signed)
BSW completed a telephone outreach with patient for food and any additional resources. Patient states she does not need any food resources and no additional resources. BSW provided patient with her telephone number if any needs may arise.   Abelino Derrick, MHA [facility]  Managed [facility] Social Worker [phone]

## 2023-01-10 ENCOUNTER — Ambulatory Visit: Payer: Commercial Managed Care - HMO | Admitting: Student

## 2023-01-10 ENCOUNTER — Encounter: Payer: Self-pay | Admitting: Student

## 2023-01-10 VITALS — BP 134/81 | HR 79 | Ht 64.0 in | Wt 255.2 lb

## 2023-01-10 DIAGNOSIS — Z5941 Food insecurity: Secondary | ICD-10-CM | POA: Diagnosis not present

## 2023-01-10 DIAGNOSIS — F319 Bipolar disorder, unspecified: Secondary | ICD-10-CM | POA: Diagnosis not present

## 2023-01-10 MED ORDER — QUETIAPINE FUMARATE 50 MG PO TABS
100.0000 mg | ORAL_TABLET | Freq: Every day | ORAL | 0 refills | Status: DC
Start: 2023-01-10 — End: 2023-02-19

## 2023-01-10 NOTE — Progress Notes (Signed)
    SUBJECTIVE:   CHIEF COMPLAINT / HPI:   Bipolar affective disorder Patient presents today to follow-up on bipolar disorder, depressive episode.  Started on Seroquel approximately 2 weeks ago, 50 mg nightly.  Patient reports mild improvement, still feeling depressed.  She states her " mind will not turn off, feels like I am playing chess."  Still having trouble sleeping at night.  On S DOH screening patient did endorse some food insecurity-she is agreeable to feedback today.  She is agreeable to referral for social determinants of health.  Patient states that she does not want to talk about what is going on.  Physician provided therapeutic silence, and changed conversation to what brings patient joy.  She enjoys being outside, and her 3 dogs Stephannie Peters, Nix.   OBJECTIVE:   BP 134/81   Pulse 79   Ht 5\' 4"  (1.626 m)   Wt 255 lb 4 oz (115.8 kg)   SpO2 98%   BMI 43.81 kg/m    General: NAD, well-appearing, well-nourished Respiratory: No respiratory distress, breathing comfortably, able to speak in full sentences Skin: warm and dry, no rashes noted on exposed skin Psych: Depressed mood and affect   ASSESSMENT/PLAN:   Bipolar affective disorder (HCC) Depressed mood and affect today.  Patient denies suicidal/homicidal ideations.  Patient not sleeping well, loss of interest, poor appetite.  Patient will take 2 of her 50 mg tablets until she runs out.  Will send appropriate prescription strength at next visit, pending efficacy. - Increase Seroquel to 100 mg nightly - Follow-up in 2 weeks   Food insecurity - Ref 2303 - Food bag provided   Tiffany Kocher, DO [facility]

## 2023-01-10 NOTE — Assessment & Plan Note (Addendum)
Depressed mood and affect today.  Patient denies suicidal/homicidal ideations.  Patient not sleeping well, loss of interest, poor appetite.  Patient will take 2 of her 50 mg tablets until she runs out.  Will send appropriate prescription strength at next visit, pending efficacy. - Increase Seroquel to 100 mg nightly - Follow-up in 2 weeks

## 2023-01-10 NOTE — Patient Instructions (Signed)
It was great to see you! Thank you for allowing me to participate in your care!   I recommend that you always bring your medications to each appointment as this makes it easy to ensure we are on the correct medications and helps [place] not miss when refills are needed.  Our plans for today:  - Take two 50 mg tablets of Seroquel every night - Follow-up with me in August   Take care and seek immediate care sooner if you develop any concerns. Please remember to show up 15 minutes before your scheduled appointment time!  Tiffany Kocher, DO [facility]

## 2023-01-15 ENCOUNTER — Other Ambulatory Visit: Payer: Commercial Managed Care - HMO

## 2023-01-15 NOTE — Patient Instructions (Signed)
Visit Information  The Patient                                              was given information about Medicaid Managed Care team care coordination services and consented to engagement with the Medicaid Managed Care team.   Social Worker will follow up in 30 days.   Alexis Shields, BSW, MHA [place]  Managed Medicaid Social Worker [phone]  

## 2023-01-15 NOTE — Patient Outreach (Signed)
Medicaid Managed Care Social Work Note  01/15/2023 Name:  [name] MRN:  [id number] DOB:  1961-06-23  [name] is an 62 y.o. year old female who is a primary patient of [name], [place].  The [facility] Managed Care Coordination team was consulted for assistance with:  Food Insecurity  Ms. [name] was given information about Medicaid Managed Care Coordination team services today. [name] Patient agreed to services and verbal consent obtained.  Engaged with patient  for by telephone forinitial visit in response to referral for case management and/or care coordination services.   Assessments/Interventions:  Review of past medical history, allergies, medications, health status, including review of consultants reports, laboratory and other test data, was performed as part of comprehensive evaluation and provision of chronic care management services.  SDOH: (Social Determinant of Health) assessments and interventions performed: [name] completed a telephone outreach with patient for food resources. Patient states she informed [name] on 01/08/23 that she did not need food resources because she does not have transportation to get to the food pantries. [name] will place a referral for [employer] for a bag delivery. Patient states her daughter works [employer] and is unsure if she would take off to take her to the food pantries.   Advanced Directives Status:  Not addressed in this encounter.  Care Plan                 Allergies  Allergen Reactions   Tamsulosin Shortness Of Breath    Medications Reviewed Today     Reviewed by [name] (Resident) on 01/10/23 at 1631  Med List Status: <None>   Medication Order Taking? Sig Documenting Provider Last Dose Status Informant  acetaminophen (TYLENOL) 500 MG tablet [id number] No Take 1,000 mg by mouth every 6 (six) hours as needed for mild pain. [provider] Taking Active Self  aspirin 81 MG EC tablet  [id number] No Take 81 mg by mouth every morning. [provider] Taking Active Self  losartan (COZAAR) 25 MG tablet [id number]  TAKE 1 TABLET BY MOUTH DAILY [name]  Active   metFORMIN (GLUCOPHAGE-XR) 500 MG 24 hr tablet [id number]  Take 1 tablet (500 mg total) by mouth 2 (two) times daily with a meal. [name]  Active   Multiple Vitamin (MULTIVITAMIN WITH MINERALS) TABS [id number] No Take 1 tablet by mouth every morning. [provider] Taking Active Self  nitroGLYCERIN (NITROSTAT) 0.4 MG SL tablet [id number] No Place 1 tablet (0.4 mg total) under the tongue every 5 (five) minutes as needed for chest pain. If after 1 tablet and 5 minutes the chest pain is still present, go to emergency room. Holly Mylar, MD Taking Active Self           Med Note (CRUTHIS, [name]   Fri Nov 30, 2022  1:29 PM)    QUEtiapine (SEROQUEL) 50 MG tablet [id number]  Take 2 tablets (100 mg total) by mouth at bedtime. [name]  Active   rosuvastatin (CRESTOR) 20 MG tablet [id number]  TAKE 1 TABLET BY MOUTH DAILY [name]  Active             Patient Active Problem List   Diagnosis Date Noted   Diabetes mellitus due to underlying condition, uncontrolled, with hyperglycemia, with long-term current use of insulin (HCC) 12/27/2022   Viral illness 12/01/2022   Chest pain 11/30/2022   Atypical chest pain 11/30/2022   Complicated UTI (urinary tract infection)  05/23/2021   Kidney stone 05/23/2021   Hydronephrosis due to obstruction of ureter 05/23/2021   Prolonged QT interval 05/23/2021   Angina pectoris (HCC) 08/26/2020   Angina at rest 08/26/2020   Microalbuminuria due to type 2 diabetes mellitus (HCC) 01/22/2020   Bacteremia due to Escherichia coli    Sepsis (HCC) 12/12/2019   Hip pain 08/10/2019   Healthcare maintenance 03/31/2015   History of CHF (congestive heart failure) 07/16/2013   COPD (chronic obstructive pulmonary disease) (HCC) 07/16/2013    Diabetes mellitus type 2 in obese 06/11/2013   Recurrent kidney stones 10/03/2012   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   Urinary tract infection 11/12/2011   Stage 2 chronic kidney disease 11/12/2011   Morbid obesity (HCC) 11/08/2011   Knee pain 03/19/2011   Type 2 diabetes mellitus with complication (HCC) 09/19/2010   Hyperlipidemia 09/19/2010   Bipolar affective disorder (HCC) 09/09/2006   Major depressive disorder, recurrent episode (HCC) 08/29/2006   TOBACCO DEPENDENCE 08/29/2006   Essential hypertension 08/29/2006   Chronic venous insufficiency 08/29/2006    Conditions to be addressed/monitored per PCP order:   food resources  There are no care plans that you recently modified to display for this patient.   Follow up:  Patient agrees to Care Plan and Follow-up.  Plan: The Managed Medicaid care management team will reach out to the patient again over the next 30 days.  Date/time of next scheduled Social Work care management/care coordination outreach:  02/15/23  [name], [name], [facility]  Managed [facility] Social Worker [phone]

## 2023-02-05 ENCOUNTER — Ambulatory Visit: Payer: Self-pay | Admitting: Student

## 2023-02-10 ENCOUNTER — Encounter: Payer: Self-pay | Admitting: Student

## 2023-02-15 ENCOUNTER — Other Ambulatory Visit: Payer: Commercial Managed Care - HMO

## 2023-02-15 NOTE — Patient Outreach (Signed)
  Medicaid Managed Care Social Work Note  02/15/2023 Name:  [name] MRN:  [id number] DOB:  Nov 26, 1960  [name] is an 62 y.o. year old female who is a primary patient of Tiffany Kocher, [place].  The [facility] Managed Care Coordination team was consulted for assistance with:  Food Insecurity  Ms. Sharber was given information about Medicaid Managed Care Coordination team services today. [name] Patient agreed to services and verbal consent obtained.  Engaged with patient  for by telephone forfollow up visit in response to referral for case management and/or care coordination services.   Assessments/Interventions:  Review of past medical history, allergies, medications, health status, including review of consultants reports, laboratory and other test data, was performed as part of comprehensive evaluation and provision of chronic care management services.  SDOH: (Social Determinant of Health) assessments and interventions performed: BSW completed a telephone outreach with patient, she states she did receive the resources BSW sent to her and she did apply for foodstamps. She has not received a food bag from [employer]. BSW will reach out to them to check on the status of her referral.  Advanced Directives Status:  Not addressed in this encounter.  Care Plan                 Allergies  Allergen Reactions   Tamsulosin Shortness Of Breath    Medications Reviewed Today   Medications were not reviewed in this encounter     Patient Active Problem List   Diagnosis Date Noted   Diabetes mellitus due to underlying condition, uncontrolled, with hyperglycemia, with long-term current use of insulin (HCC) 12/27/2022   Viral illness 12/01/2022   Chest pain 11/30/2022   Atypical chest pain 11/30/2022   Complicated UTI (urinary tract infection) 05/23/2021   Kidney stone 05/23/2021   Hydronephrosis due to obstruction of ureter 05/23/2021   Prolonged QT interval  05/23/2021   Angina pectoris (HCC) 08/26/2020   Angina at rest 08/26/2020   Microalbuminuria due to type 2 diabetes mellitus (HCC) 01/22/2020   Bacteremia due to Escherichia coli    Sepsis (HCC) 12/12/2019   Hip pain 08/10/2019   Healthcare maintenance 03/31/2015   History of CHF (congestive heart failure) 07/16/2013   COPD (chronic obstructive pulmonary disease) (HCC) 07/16/2013   Diabetes mellitus type 2 in obese 06/11/2013   Recurrent kidney stones 10/03/2012   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   Urinary tract infection 11/12/2011   Stage 2 chronic kidney disease 11/12/2011   Morbid obesity (HCC) 11/08/2011   Knee pain 03/19/2011   Type 2 diabetes mellitus with complication (HCC) 09/19/2010   Hyperlipidemia 09/19/2010   Bipolar affective disorder (HCC) 09/09/2006   Major depressive disorder, recurrent episode (HCC) 08/29/2006   TOBACCO DEPENDENCE 08/29/2006   Essential hypertension 08/29/2006   Chronic venous insufficiency 08/29/2006    Conditions to be addressed/monitored per PCP order:   food resources  There are no care plans that you recently modified to display for this patient.   Follow up:  Patient agrees to Care Plan and Follow-up.  Plan: The Managed Medicaid care management team will reach out to the patient again over the next 30 days.  Date/time of next scheduled Social Work care management/care coordination outreach:  03/20/23  Gus Puma, [name], [facility]  Managed [facility] Social Worker [phone]

## 2023-02-15 NOTE — Patient Instructions (Signed)
 Visit Information  The Patient                                              was given information about Medicaid Managed Care team care coordination services and consented to engagement with the [facility] Managed Care team.   Social Worker will follow up on 03/20/23.   Abelino Derrick, MHA [facility]  Managed [facility] Social Worker [phone]

## 2023-02-19 ENCOUNTER — Ambulatory Visit (INDEPENDENT_AMBULATORY_CARE_PROVIDER_SITE_OTHER): Payer: Commercial Managed Care - HMO | Admitting: Student

## 2023-02-19 ENCOUNTER — Encounter: Payer: Self-pay | Admitting: Student

## 2023-02-19 VITALS — BP 138/76 | HR 81 | Ht 64.0 in | Wt 251.8 lb

## 2023-02-19 DIAGNOSIS — Z1159 Encounter for screening for other viral diseases: Secondary | ICD-10-CM

## 2023-02-19 DIAGNOSIS — R809 Proteinuria, unspecified: Secondary | ICD-10-CM

## 2023-02-19 DIAGNOSIS — Z1211 Encounter for screening for malignant neoplasm of colon: Secondary | ICD-10-CM | POA: Diagnosis not present

## 2023-02-19 DIAGNOSIS — E1129 Type 2 diabetes mellitus with other diabetic kidney complication: Secondary | ICD-10-CM | POA: Diagnosis not present

## 2023-02-19 DIAGNOSIS — F3132 Bipolar disorder, current episode depressed, moderate: Secondary | ICD-10-CM

## 2023-02-19 DIAGNOSIS — E118 Type 2 diabetes mellitus with unspecified complications: Secondary | ICD-10-CM

## 2023-02-19 DIAGNOSIS — Z794 Long term (current) use of insulin: Secondary | ICD-10-CM

## 2023-02-19 DIAGNOSIS — E0865 Diabetes mellitus due to underlying condition with hyperglycemia: Secondary | ICD-10-CM

## 2023-02-19 MED ORDER — LOSARTAN POTASSIUM 25 MG PO TABS
25.0000 mg | ORAL_TABLET | Freq: Every day | ORAL | 0 refills | Status: AC
Start: 2023-02-19 — End: ?

## 2023-02-19 MED ORDER — QUETIAPINE FUMARATE 200 MG PO TABS
200.0000 mg | ORAL_TABLET | Freq: Every day | ORAL | 1 refills | Status: DC
Start: 2023-02-19 — End: 2023-04-22

## 2023-02-19 MED ORDER — ROSUVASTATIN CALCIUM 20 MG PO TABS: 20.0000 mg | ORAL_TABLET | Freq: Every day | ORAL | 0 refills | Status: DC

## 2023-02-19 NOTE — Assessment & Plan Note (Signed)
Compliant without side effects on Seroquel.  Still having some symptoms, room to go up medication.  Titrate to 200 mg nightly. - Follow-up 4 weeks

## 2023-02-19 NOTE — Patient Instructions (Signed)
It was great to see you! Thank you for allowing me to participate in your care!   I recommend that you always bring your medications to each appointment as this makes it easy to ensure we are on the correct medications and helps [place] not miss when refills are needed.  Our plans for today:  - Take 200 mg of Seroquel at night - Follow-up in 4 weeks for Pap-smear - I sent a referral for colonoscopy, they will call you to schedule  Take care and seek immediate care sooner if you develop any concerns. Please remember to show up 15 minutes before your scheduled appointment time!  Tiffany Kocher, DO [facility]

## 2023-02-19 NOTE — Progress Notes (Cosign Needed Addendum)
    SUBJECTIVE:   CHIEF COMPLAINT / HPI:   Bipolar disorder Reports benefit with increase from 50 mg to 100 mg nightly.  Still having depressive episodes and emotional lability.  Agreeable to dose increase today.  Denies SI/HI.  Denies side effects with medication.  Health maintenance Agreeable to colonoscopy Agreeable to Pap smear, appointment to be scheduled Agreeable to hep C screening and UACR today. Mammogram discussed, declined at this time Due for ophthalmology exam, discussed  OBJECTIVE:   BP 138/76   Pulse 81   Ht 5\' 4"  (1.626 m)   Wt 251 lb 12.8 oz (114.2 kg)   SpO2 96%   BMI 43.22 kg/m    General: NAD, pleasant Cardio: RRR, no MRG. Cap Refill <2s. Respiratory: CTAB, normal wob on RA Skin: Warm and dry  ASSESSMENT/PLAN:  Bipolar affective disorder, currently depressed, moderate (HCC) Assessment & Plan: Compliant without side effects on Seroquel.  Still having some symptoms, room to go up medication.  Titrate to 200 mg nightly. - Follow-up 4 weeks  Orders: -     QUEtiapine Fumarate; Take 1 tablet (200 mg total) by mouth at bedtime.  Dispense: 30 tablet; Refill: 1  Type 2 diabetes mellitus with complication (HCC) A1c not at goal. Continue metformin. Plan to recheck in 1 month. -     Microalbumin / creatinine urine ratio -     Losartan Potassium; Take 1 tablet (25 mg total) by mouth daily.  Dispense: 30 tablet; Refill: 0 -     Rosuvastatin Calcium; Take 1 tablet (20 mg total) by mouth daily.  Dispense: 30 tablet; Refill: 0  Need for hepatitis C screening test -     Hepatitis C antibody  Colon cancer screening -     Ambulatory referral to Gastroenterology  Follow-up recommendations Repeat A1c in 1 month.  Consider adding GLP-1 for better glycemic control and weight loss. Discuss smoking history Complete Pap smear  Tiffany Kocher, DO [facility]

## 2023-02-20 LAB — HEPATITIS C ANTIBODY: Hep C Virus Ab: NONREACTIVE

## 2023-02-20 LAB — MICROALBUMIN / CREATININE URINE RATIO
Creatinine, Urine: 18.3 mg/dL
Microalb/Creat Ratio: <16 mg/g{creat} (ref 0–29)
Microalbumin, Urine: <3 ug/mL

## 2023-03-21 ENCOUNTER — Other Ambulatory Visit: Payer: Commercial Managed Care - HMO

## 2023-03-21 NOTE — Patient Instructions (Signed)
Medicaid Managed Care   Unsuccessful Outreach Note  03/21/2023 Name: [name] MRN: [id number] DOB: 1960-09-13  Referred by: Tiffany Kocher, DO Reason for referral : High Risk Managed Medicaid (MM Social work unsuccessful telephone outreach )   An unsuccessful telephone outreach was attempted today. The patient was referred to the case management team for assistance with care management and care coordination.   Follow Up Plan: A HIPAA compliant phone message was left for the patient providing contact information and requesting a return call.   Abelino Derrick, MHA [facility]  Managed [facility] Social Worker [phone]

## 2023-03-21 NOTE — Patient Outreach (Signed)
Medicaid Managed Care   Unsuccessful Outreach Note  03/21/2023 Name: [name] MRN: [id number] DOB: 1960-09-13  Referred by: Tiffany Kocher, DO Reason for referral : High Risk Managed Medicaid (MM Social work unsuccessful telephone outreach )   An unsuccessful telephone outreach was attempted today. The patient was referred to the case management team for assistance with care management and care coordination.   Follow Up Plan: A HIPAA compliant phone message was left for the patient providing contact information and requesting a return call.   Abelino Derrick, MHA [facility]  Managed [facility] Social Worker [phone]

## 2023-03-22 ENCOUNTER — Ambulatory Visit: Payer: Commercial Managed Care - HMO | Admitting: Student

## 2023-03-22 NOTE — Progress Notes (Deleted)
    SUBJECTIVE:   CHIEF COMPLAINT / HPI: Obtain pap smear  Pap Smear: Patient is a 62 y.o. female presenting for pap smear.  She states she does not have any discharge, odor or vaginal symptosm.  She is not *** interested in screening for sexually transmitted infections today. She is on contraception with ***  PERTINENT  PMH / PSH: ***None relevant  OBJECTIVE:   There were no vitals taken for this visit.   General: NAD, pleasant, able to participate in exam Respiratory: Normal effort, no obvious respiratory distress Pelvic: VULVA: normal appearing vulva with no masses, tenderness or lesions, VAGINA: Normal appearing vagina with normal color, no lesions, with {GYN VAGINAL DISCHARGE:21986} discharge present, ***CERVIX: No lesions, {GYN VAGINAL DISCHARGE:21986} discharge present. Pap smear performed with cytobrush and spatula  Chaperone *** CMA present for pelvic exam  ASSESSMENT/PLAN:   No problem-specific Assessment & Plan notes found for this encounter.  Assessment:  62 y.o. female here for pap smear.  Physical exam significant for*** discharge.  Patient is not*** interested in STI screening.   Plan: -F/u pap smear results -Gonnorhea, chlamydia *** -RPR, HIV *** -Follow-up as needed  Tiffany Kocher, DO [facility]

## 2023-03-23 ENCOUNTER — Other Ambulatory Visit: Payer: Self-pay | Admitting: Student

## 2023-03-23 DIAGNOSIS — F319 Bipolar disorder, unspecified: Secondary | ICD-10-CM

## 2023-03-23 DIAGNOSIS — E0865 Diabetes mellitus due to underlying condition with hyperglycemia: Secondary | ICD-10-CM

## 2023-04-09 ENCOUNTER — Telehealth: Payer: Self-pay

## 2023-04-09 NOTE — Telephone Encounter (Signed)
[facility]  Medicaid Managed Care   Unsuccessful Outreach Note  04/09/2023 Name: [name] MRN: [id number] DOB: 11-Apr-1961  Referred by: Tiffany Kocher, DO Reason for referral : Appointment (I called the patient today to get her missed phone appointment with the MM BSW rescheduled. She did not answer but I was able to leave a message on her VM.)   A second unsuccessful telephone outreach was attempted today. The patient was referred to the case management team for assistance with care management and care coordination.   Follow Up Plan: A HIPAA compliant phone message was left for the patient providing contact information and requesting a return call.  The care management team will reach out to the patient again over the next 7 days.   Weston Settle Care Guide  [facility] Managed  Care Guide [facility]  [phone]

## 2023-04-12 ENCOUNTER — Telehealth: Payer: Self-pay

## 2023-04-22 ENCOUNTER — Other Ambulatory Visit: Payer: Self-pay | Admitting: Student

## 2023-04-22 DIAGNOSIS — F3132 Bipolar disorder, current episode depressed, moderate: Secondary | ICD-10-CM

## 2024-07-06 ENCOUNTER — Other Ambulatory Visit: Payer: Self-pay

## 2024-07-06 ENCOUNTER — Emergency Department (HOSPITAL_COMMUNITY): Payer: Self-pay

## 2024-07-06 ENCOUNTER — Emergency Department (HOSPITAL_COMMUNITY)
Admission: EM | Admit: 2024-07-06 | Discharge: 2024-07-06 | Disposition: A | Discharge status: Home / Self Care | Payer: Self-pay | Attending: Emergency Medicine | Admitting: Emergency Medicine

## 2024-07-06 DIAGNOSIS — K5732 Diverticulitis of large intestine without perforation or abscess without bleeding: Secondary | ICD-10-CM | POA: Insufficient documentation

## 2024-07-06 DIAGNOSIS — R000 Tachycardia, unspecified: Secondary | ICD-10-CM | POA: Insufficient documentation

## 2024-07-06 DIAGNOSIS — Z79899 Other long term (current) drug therapy: Secondary | ICD-10-CM | POA: Insufficient documentation

## 2024-07-06 DIAGNOSIS — F17200 Nicotine dependence, unspecified, uncomplicated: Secondary | ICD-10-CM | POA: Insufficient documentation

## 2024-07-06 DIAGNOSIS — Z7984 Long term (current) use of oral hypoglycemic drugs: Secondary | ICD-10-CM | POA: Insufficient documentation

## 2024-07-06 DIAGNOSIS — I129 Hypertensive chronic kidney disease with stage 1 through stage 4 chronic kidney disease, or unspecified chronic kidney disease: Secondary | ICD-10-CM | POA: Insufficient documentation

## 2024-07-06 DIAGNOSIS — K5792 Diverticulitis of intestine, part unspecified, without perforation or abscess without bleeding: Secondary | ICD-10-CM

## 2024-07-06 DIAGNOSIS — Z7982 Long term (current) use of aspirin: Secondary | ICD-10-CM | POA: Insufficient documentation

## 2024-07-06 DIAGNOSIS — N189 Chronic kidney disease, unspecified: Secondary | ICD-10-CM | POA: Insufficient documentation

## 2024-07-06 DIAGNOSIS — E1122 Type 2 diabetes mellitus with diabetic chronic kidney disease: Secondary | ICD-10-CM | POA: Insufficient documentation

## 2024-07-06 DIAGNOSIS — N390 Urinary tract infection, site not specified: Secondary | ICD-10-CM | POA: Insufficient documentation

## 2024-07-06 LAB — CBC WITH DIFFERENTIAL/PLATELET
Abs Immature Granulocytes: 0.06 K/uL (ref 0.00–0.07)
Basophils Absolute: 0.1 K/uL (ref 0.0–0.1)
Basophils Relative: 1 %
Eosinophils Absolute: 0.1 K/uL (ref 0.0–0.5)
Eosinophils Relative: 1 %
HCT: 40.9 % (ref 36.0–46.0)
Hemoglobin: 13.8 g/dL (ref 12.0–15.0)
Immature Granulocytes: 1 %
Lymphocytes Relative: 20 %
Lymphs Abs: 1.7 K/uL (ref 0.7–4.0)
MCH: 30 pg (ref 26.0–34.0)
MCHC: 33.7 g/dL (ref 30.0–36.0)
MCV: 88.9 fL (ref 80.0–100.0)
Monocytes Absolute: 0.7 K/uL (ref 0.1–1.0)
Monocytes Relative: 8 %
Neutro Abs: 5.7 K/uL (ref 1.7–7.7)
Neutrophils Relative %: 69 %
Platelets: 260 K/uL (ref 150–400)
RBC: 4.6 MIL/uL (ref 3.87–5.11)
RDW: 12.9 % (ref 11.5–15.5)
Smear Review: NORMAL
WBC: 8.3 K/uL (ref 4.0–10.5)
nRBC: 0 % (ref 0.0–0.2)

## 2024-07-06 LAB — COMPREHENSIVE METABOLIC PANEL WITH GFR
ALT: 20 U/L (ref 0–44)
AST: 32 U/L (ref 15–41)
Albumin: 3.7 g/dL (ref 3.5–5.0)
Alkaline Phosphatase: 89 U/L (ref 38–126)
Anion gap: 12 (ref 5–15)
BUN: 13 mg/dL (ref 8–23)
CO2: 23 mmol/L (ref 22–32)
Calcium: 9.4 mg/dL (ref 8.9–10.3)
Chloride: 106 mmol/L (ref 98–111)
Creatinine, Ser: 1 mg/dL (ref 0.44–1.00)
GFR, Estimated: >60 mL/min
Glucose, Bld: 134 mg/dL — ABNORMAL HIGH (ref 70–99)
Potassium: 3.9 mmol/L (ref 3.5–5.1)
Sodium: 141 mmol/L (ref 135–145)
Total Bilirubin: 0.3 mg/dL (ref 0.0–1.2)
Total Protein: 7.3 g/dL (ref 6.5–8.1)

## 2024-07-06 LAB — URINALYSIS, ROUTINE W REFLEX MICROSCOPIC
Bilirubin Urine: NEGATIVE
Glucose, UA: NEGATIVE mg/dL
Ketones, ur: NEGATIVE mg/dL
Nitrite: NEGATIVE
Protein, ur: NEGATIVE mg/dL
Specific Gravity, Urine: 1.004 — ABNORMAL LOW (ref 1.005–1.030)
WBC, UA: >50 WBC/hpf (ref 0–5)
pH: 6 (ref 5.0–8.0)

## 2024-07-06 LAB — RESP PANEL BY RT-PCR (RSV, FLU A&B, COVID)  RVPGX2
Influenza A by PCR: NEGATIVE
Influenza B by PCR: NEGATIVE
Resp Syncytial Virus by PCR: NEGATIVE
SARS Coronavirus 2 by RT PCR: NEGATIVE

## 2024-07-06 LAB — LIPASE, BLOOD: Lipase: 40 U/L (ref 11–51)

## 2024-07-06 MED ORDER — METRONIDAZOLE 500 MG/100ML IV SOLN
500.0000 mg | Freq: Once | INTRAVENOUS | Status: AC
  Administered 2024-07-06: 500 mg via INTRAVENOUS
  Filled 2024-07-06: qty 100

## 2024-07-06 MED ORDER — SODIUM CHLORIDE 0.9 % IV BOLUS
1000.0000 mL | Freq: Once | INTRAVENOUS | Status: AC
  Administered 2024-07-06: 1000 mL via INTRAVENOUS

## 2024-07-06 MED ORDER — IOHEXOL 300 MG/ML  SOLN
100.0000 mL | Freq: Once | INTRAMUSCULAR | Status: AC | PRN
  Administered 2024-07-06: 100 mL via INTRAVENOUS

## 2024-07-06 MED ORDER — METRONIDAZOLE 500 MG PO TABS: 500.0000 mg | ORAL_TABLET | Freq: Three times a day (TID) | ORAL | 0 refills | Status: AC

## 2024-07-06 MED ORDER — FLUCONAZOLE 200 MG PO TABS: 200.0000 mg | ORAL_TABLET | Freq: Every day | ORAL | 0 refills | Status: AC | PRN

## 2024-07-06 MED ORDER — CIPROFLOXACIN HCL 500 MG PO TABS: 500.0000 mg | ORAL_TABLET | Freq: Two times a day (BID) | ORAL | 0 refills | Status: AC

## 2024-07-06 MED ORDER — CIPROFLOXACIN HCL 500 MG PO TABS
500.0000 mg | ORAL_TABLET | Freq: Once | ORAL | Status: AC
  Administered 2024-07-06: 500 mg via ORAL
  Filled 2024-07-06: qty 1

## 2024-07-06 MED ORDER — SODIUM CHLORIDE 0.9 % IV SOLN: 1.0000 g | Freq: Once | INTRAVENOUS | Status: DC

## 2024-07-06 NOTE — ED Triage Notes (Addendum)
 Patient reports feeling faint/dizziness since yesterday, low back pain, abdominal pain, dysuria, denies fevers. Patient is alert and oriented x 4. Airway patent, respirations even and unlabored. Skin normal, warm and dry.

## 2024-07-06 NOTE — ED Provider Notes (Signed)
 " [facility] AT [facility] Provider Note   CSN: [id number] Arrival date & time: 07/06/24  9184     Patient presents with: Dizziness, Back Pain, and Dysuria   [name] is a 64 y.o. female with history of diabetes, bipolar disorder, high blood pressure, smoking, high cholesterol, obesity, chronic kidney disease, presenting to the ER with complaint of generalized body weakness, abdominal cramping.  She reports that symptoms began within the past 24 hours.  Of note, the patient reports she had a flulike illness about 1-1/2 weeks ago.  She said that everyone in her daughter's household had the same symptoms, and they assumed it was the flu, although no one tested for it.  The patient herself had fevers, body aches, headaches, felt like death for about 2 days.  She had recovered from this illness before she got sick again last night.  Now she says her abdomen is cramping all over.  She says it feels like it has in the past when she has kidney stones or urinary infection, she does report that she has burning with urination.  She reports an abdominal surgical history of partial hysterectomy, cholecystectomy, kidney stone removal  Patient was hospitalized for chest pain and also noted to have a UTI last year, in May 2024, with a urine culture growing over 100,000 colony units of E. coli, pansensitive.  Prior to that, she again had an E. coli infection in December 2022, which was resistant to ciprofloxacin  and Bactrim , but otherwise sensitive.  Patient  HPI     Prior to Admission medications  Medication Sig Start Date End Date Taking? Authorizing Provider  ciprofloxacin  (CIPRO ) 500 MG tablet Take 1 tablet (500 mg total) by mouth every 12 (twelve) hours for 7 days. 07/06/24 07/13/24 Yes Estee Yohe, Donnice PARAS, MD  fluconazole  (DIFLUCAN ) 200 MG tablet Take 1 tablet (200 mg total) by mouth daily as needed for up to 2 doses. 07/06/24  Yes Coal[place] Arch, Donnice PARAS, MD   metroNIDAZOLE  (FLAGYL ) 500 MG tablet Take 1 tablet (500 mg total) by mouth 3 (three) times daily for 7 days. 07/06/24 07/13/24 Yes Cathlin Buchan, Donnice PARAS, MD  acetaminophen  (TYLENOL ) 500 MG tablet Take 1,000 mg by mouth every 6 (six) hours as needed for mild pain.    [provider]  aspirin  81 MG EC tablet Take 81 mg by mouth every morning.    [provider]  losartan  (COZAAR ) 25 MG tablet Take 1 tablet (25 mg total) by mouth daily. 02/19/23   Howell Lunger, DO  metFORMIN  (GLUCOPHAGE -XR) 500 MG 24 hr tablet Take 1 tablet (500 mg total) by mouth 2 (two) times daily with a meal. 12/27/22   Howell Lunger, DO  Multiple Vitamin (MULTIVITAMIN WITH MINERALS) TABS Take 1 tablet by mouth every morning.    [provider]  nitroGLYCERIN  (NITROSTAT ) 0.4 MG SL tablet Place 1 tablet (0.4 mg total) under the tongue every 5 (five) minutes as needed for chest pain. If after 1 tablet and 5 minutes the chest pain is still present, go to emergency room. 08/23/20   Mahoney, Caitlin, MD  QUEtiapine  (SEROQUEL ) 200 MG tablet TAKE 1 TABLET BY MOUTH AT BEDTIME 04/22/23   Howell Lunger, DO  rosuvastatin  (CRESTOR ) 20 MG tablet TAKE 1 TABLET BY MOUTH DAILY 03/25/23   Howell Lunger, DO    Allergies: Tamsulosin    Review of Systems  Updated Vital Signs BP (!) 153/91 (BP Location: Right Arm)   Pulse 70   Temp 98.3  F (36.8 C) (Oral)   Resp 18   Ht 5' 4 (1.626 m)   Wt 108.9 kg   SpO2 97%   BMI 41.20 kg/m   Physical Exam Constitutional:      General: She is not in acute distress. HENT:     Head: Normocephalic and atraumatic.  Eyes:     Conjunctiva/sclera: Conjunctivae normal.     Pupils: Pupils are equal, round, and reactive to light.  Cardiovascular:     Rate and Rhythm: Regular rhythm. Tachycardia present.  Pulmonary:     Effort: Pulmonary effort is normal. No respiratory distress.  Abdominal:     General: There is no distension.     Tenderness: There is no abdominal tenderness.   Skin:    General: Skin is warm and dry.  Neurological:     General: No focal deficit present.     Mental Status: She is alert. Mental status is at baseline.  Psychiatric:        Mood and Affect: Mood normal.        Behavior: Behavior normal.     (all labs ordered are listed, but only abnormal results are displayed) Labs Reviewed  URINALYSIS, ROUTINE W REFLEX MICROSCOPIC - Abnormal; Notable for the following components:      Result Value   APPearance CLOUDY (*)    Specific Gravity, Urine 1.004 (*)    Hgb urine dipstick MODERATE (*)    Leukocytes,Ua LARGE (*)    Bacteria, UA FEW (*)    All other components within normal limits  COMPREHENSIVE METABOLIC PANEL WITH GFR - Abnormal; Notable for the following components:   Glucose, Bld 134 (*)    All other components within normal limits  RESP PANEL BY RT-PCR (RSV, FLU A&B, COVID)  [id number]  URINE CULTURE  CBC WITH DIFFERENTIAL/PLATELET  LIPASE, BLOOD    EKG: EKG Interpretation Date/Time:  Monday July 06 2024 08:20:38 EST Ventricular Rate:  114 PR Interval:  146 QRS Duration:  97 QT Interval:  342 QTC Calculation: 471 R Axis:   60  Text Interpretation: Sinus tachycardia Confirmed by Cottie Cough [phone]) on 07/06/2024 8:34:34 AM  Radiology: CT ABDOMEN PELVIS W CONTRAST Result Date: 07/06/2024 CLINICAL DATA:  Acute diffuse abdominal pain with vomiting. Remote cholecystectomy EXAM: CT ABDOMEN AND PELVIS WITH CONTRAST TECHNIQUE: Multidetector CT imaging of the abdomen and pelvis was performed using the standard protocol following bolus administration of intravenous contrast. RADIATION DOSE REDUCTION: This exam was performed according to the departmental dose-optimization program which includes automated exposure control, adjustment of the mA and/or kV according to patient size and/or use of iterative reconstruction technique. CONTRAST:  OMNIPAQUE  IOHEXOL  300 MG/ML  SOLN COMPARISON:  05/22/2021 FINDINGS: Lower chest: No acute  abnormality. Hepatobiliary: Similar mild diffuse hepatic steatosis. No focal hepatic abnormality or biliary dilatation pattern. Hepatic and portal veins are patent. Remote cholecystectomy. Similar prominence of the common bile duct, suspect postcholecystectomy related. Pancreas: Unremarkable. No pancreatic ductal dilatation or surrounding inflammatory changes. Spleen: Normal in size without focal abnormality. Adrenals/Urinary Tract: Similar thickening of the adrenal glands without focal abnormality. Similar bilateral scattered areas of renal cortical scarring. Scattered bilateral nonobstructing nephrolithiasis throughout both kidneys. No acute obstructive uropathy, hydronephrosis, perinephric inflammatory process. Ureters are symmetric and decompressed. Bladder unremarkable. Stomach/Bowel: Negative for bowel obstruction, significant dilatation, ileus, or free air. Normal appendix in the right lower quadrant. Scattered colonic diverticulosis. Within the pelvis, the sigmoid colon demonstrates an area of mild wall thickening where there is diverticular disease and mild  pericolonic edema/inflammation, images 64 through 67, compatible with mild acute sigmoid diverticulitis. No evidence of perforation, free air, fluid collection, or abscess. Vascular/Lymphatic: Extensive calcified aortoiliac atherosclerosis. No occlusive disease, aneurysm or dissection. No retroperitoneal hemorrhage or hematoma. No veno-occlusive process. No bulky adenopathy appreciated. Reproductive: Uterus and bilateral adnexa are unremarkable. Other: No abdominal wall hernia or abnormality. No abdominopelvic ascites. Musculoskeletal: Degenerative changes of the lumbosacral spine noted. No acute osseous finding. IMPRESSION: 1. Mild acute sigmoid diverticulitis without complicating feature. 2. Hepatic steatosis. 3. Remote cholecystectomy. 4. Bilateral nonobstructing nephrolithiasis. 5. Aortic atherosclerosis. Aortic Atherosclerosis (ICD10-I70.0).  Electronically Signed   By: CHRISTELLA.  Shick M.D.   On: 07/06/2024 12:10     Procedures   Medications Ordered in the ED  sodium chloride  0.9 % bolus 1,000 mL (0 mLs Intravenous Stopped 07/06/24 1303)  iohexol  (OMNIPAQUE ) 300 MG/ML solution 100 mL (100 mLs Intravenous Contrast Given 07/06/24 1046)  ciprofloxacin  (CIPRO ) tablet 500 mg (500 mg Oral Given 07/06/24 1254)  metroNIDAZOLE  (FLAGYL ) IVPB 500 mg (0 mg Intravenous Stopped 07/06/24 1357)                                    Medical Decision Making Amount and/or Complexity of Data Reviewed Labs: ordered. Radiology: ordered.  Risk Prescription drug management.   This patient presents to the ED with concern for abdominal pain, generalized weakness. This involves an extensive number of treatment options, and is a complaint that carries with it a high risk of complications and morbidity.  The differential diagnosis includes ureteral colic versus UTI versus viral illness versus colitis versus pancreatitis versus other intra-abdominal process  Viral illness including COVID influenza are both in differential  Co-morbidities that complicate the patient evaluation: History of recurrent kidney stones and urinary infections, risk of recurrence, diabetes history at risk of diabetic complications, metabolic derangement  External records from outside source obtained and reviewed including prior urine culture results as noted above  I ordered and personally interpreted labs.  The pertinent results include: UA with large leukocytes.  No other emergent findings on workup  I ordered imaging studies including CT abdomen pelvis I independently visualized and interpreted imaging which showed findings of potential mild diverticulitis I agree with the radiologist interpretation  The patient was maintained on a cardiac monitor.  I personally viewed and interpreted the cardiac monitored which showed an underlying rhythm of: Sinus tachycardia and sinus rhythm  Per  my interpretation the patient's ECG shows sinus tachycardia with no acute ischemia  I ordered medication including ciprofloxacin  and Flagyl  for cross coverage of intra-abdominal infection as well as UTI and dysuria  I have reviewed the patients home medicines and have made adjustments as needed  Test Considered: doubt Pelvic pathology   After the interventions noted above, I reevaluated the patient and found that they have: stayed the same  Suspected patient may have urinary tract infection and/or diverticulitis as a cause of her abdominal discomfort.  It is reasonable to treat her with cross coverage for both.  Antibiotics prescribed, including ciprofloxacin  and Flagyl , as her last urine culture was pansensitive.  Diflucan  offered for yeast infection side effects  Dispostion:  After consideration of the diagnostic results and the patients response to treatment, I feel that the patent would benefit from outpatient follow-up.      Final diagnoses:  Urinary tract infection without hematuria, site unspecified  Diverticulitis    ED Discharge Orders  Ordered    ciprofloxacin  (CIPRO ) 500 MG tablet  Every 12 hours        07/06/24 1236    metroNIDAZOLE  (FLAGYL ) 500 MG tablet  3 times daily        07/06/24 1236    fluconazole  (DIFLUCAN ) 200 MG tablet  Daily PRN        07/06/24 1236               Cottie Donnice PARAS, MD 07/06/24 1707  "

## 2024-07-08 LAB — URINE CULTURE: Culture: >=100000 — AB

## 2024-07-09 ENCOUNTER — Telehealth (HOSPITAL_BASED_OUTPATIENT_CLINIC_OR_DEPARTMENT_OTHER): Payer: Self-pay | Admitting: *Deleted

## 2024-07-09 NOTE — Telephone Encounter (Signed)
 Post ED Visit - Positive Culture Follow-up  Culture report reviewed by antimicrobial stewardship pharmacist: [facility] []  Rankin Dee, Pharm.D. []  Venetia Gully, Pharm.D., BCPS AQ-ID []  Garrel Crews, Pharm.D., BCPS []  Almarie Lunger, [address].D., BCPS []  [place], [address].D., BCPS, AAHIVP []  Rosaline Bihari, Pharm.D., BCPS, AAHIVP []  Vernell Meier, PharmD, BCPS []  Latanya Hint, PharmD, BCPS []  Donald Medley, PharmD, BCPS []  Rocky Bold, PharmD []  Dorothyann Alert, PharmD, BCPS []  Morene Babe, PharmD  [facility] [x]  Almarie Lunger, PharmD []  Romona Bliss, PharmD []  Dolphus Roller, PharmD []  Veva Seip, Rph []  Vernell Daunt) Leonce, PharmD []  Eva Allis, PharmD []  Rosaline Millet, PharmD []  Iantha Batch, PharmD []  Arvin Gauss, PharmD []  Wanda Hasting, PharmD []  Ronal Rav, PharmD []  Rocky Slade, PharmD []  Bard Jeans, PharmD   Positive urine culture Treated with ciprofloxacin , organism sensitive to the same and no further patient follow-up is required at this time.  Lorita Barnie Pereyra 07/09/2024, 8:42 AM
